# Patient Record
Sex: Female | Born: 1955 | Race: White | Hispanic: No | Marital: Single | Smoking: Former smoker
Health system: Southern US, Community
[De-identification: ages and names within clinical notes are randomized; demographics above are authoritative.]

## PROBLEM LIST (undated history)

## (undated) DIAGNOSIS — M549 Dorsalgia, unspecified: Secondary | ICD-10-CM

## (undated) DIAGNOSIS — G8929 Other chronic pain: Secondary | ICD-10-CM

## (undated) DIAGNOSIS — I10 Essential (primary) hypertension: Secondary | ICD-10-CM

## (undated) HISTORY — PX: BREAST SURGERY: SHX581

## (undated) HISTORY — PX: BREAST ENHANCEMENT SURGERY: SHX7

---

## 2004-10-18 ENCOUNTER — Emergency Department: Payer: Self-pay | Admitting: Emergency Medicine

## 2005-02-19 ENCOUNTER — Ambulatory Visit: Payer: Self-pay | Admitting: Family Medicine

## 2005-03-18 ENCOUNTER — Ambulatory Visit: Payer: Self-pay | Admitting: Family Medicine

## 2006-04-14 ENCOUNTER — Ambulatory Visit: Payer: Self-pay

## 2006-06-28 ENCOUNTER — Ambulatory Visit: Payer: Self-pay | Admitting: Family Medicine

## 2007-11-27 ENCOUNTER — Emergency Department: Payer: Self-pay | Admitting: Emergency Medicine

## 2007-12-02 ENCOUNTER — Emergency Department: Payer: Self-pay | Admitting: Emergency Medicine

## 2008-02-07 ENCOUNTER — Emergency Department: Payer: Self-pay | Admitting: Emergency Medicine

## 2008-06-06 ENCOUNTER — Ambulatory Visit: Payer: Self-pay | Admitting: Family Medicine

## 2015-04-10 ENCOUNTER — Other Ambulatory Visit: Payer: Self-pay | Admitting: Orthopedic Surgery

## 2015-04-10 DIAGNOSIS — M545 Low back pain, unspecified: Secondary | ICD-10-CM

## 2015-04-29 ENCOUNTER — Ambulatory Visit
Admission: RE | Admit: 2015-04-29 | Discharge: 2015-04-29 | Disposition: A | Discharge status: Home / Self Care | Payer: No Typology Code available for payment source | Source: Ambulatory Visit | Attending: Orthopedic Surgery | Admitting: Orthopedic Surgery

## 2015-04-29 DIAGNOSIS — M545 Low back pain, unspecified: Secondary | ICD-10-CM

## 2015-04-29 DIAGNOSIS — G8929 Other chronic pain: Secondary | ICD-10-CM | POA: Diagnosis present

## 2015-06-29 ENCOUNTER — Encounter: Payer: Self-pay | Admitting: Medical Oncology

## 2015-06-29 ENCOUNTER — Emergency Department
Admission: EM | Admit: 2015-06-29 | Discharge: 2015-06-29 | Disposition: A | Discharge status: Home / Self Care | Payer: BLUE CROSS/BLUE SHIELD | Attending: Emergency Medicine | Admitting: Emergency Medicine

## 2015-06-29 DIAGNOSIS — Z87891 Personal history of nicotine dependence: Secondary | ICD-10-CM | POA: Insufficient documentation

## 2015-06-29 DIAGNOSIS — I160 Hypertensive urgency: Secondary | ICD-10-CM | POA: Diagnosis not present

## 2015-06-29 DIAGNOSIS — I10 Essential (primary) hypertension: Secondary | ICD-10-CM | POA: Diagnosis present

## 2015-06-29 HISTORY — DX: Dorsalgia, unspecified: M54.9

## 2015-06-29 HISTORY — DX: Essential (primary) hypertension: I10

## 2015-06-29 HISTORY — DX: Other chronic pain: G89.29

## 2015-06-29 LAB — CBC
HEMATOCRIT: 38.5 % (ref 35.0–47.0)
HEMOGLOBIN: 13.4 g/dL (ref 12.0–16.0)
MCH: 31.1 pg (ref 26.0–34.0)
MCHC: 34.7 g/dL (ref 32.0–36.0)
MCV: 89.6 fL (ref 80.0–100.0)
Platelets: 228 10*3/uL (ref 150–440)
RBC: 4.3 MIL/uL (ref 3.80–5.20)
RDW: 13.2 % (ref 11.5–14.5)
WBC: 10.2 10*3/uL (ref 3.6–11.0)

## 2015-06-29 LAB — BASIC METABOLIC PANEL
Anion gap: 8 (ref 5–15)
BUN: 11 mg/dL (ref 6–20)
CHLORIDE: 101 mmol/L (ref 101–111)
CO2: 29 mmol/L (ref 22–32)
Calcium: 9.6 mg/dL (ref 8.9–10.3)
Creatinine, Ser: 0.64 mg/dL (ref 0.44–1.00)
GFR calc Af Amer: >60 mL/min (ref >60)
GLUCOSE: 111 mg/dL — AB (ref 65–99)
POTASSIUM: 3.3 mmol/L — AB (ref 3.5–5.1)
Sodium: 138 mmol/L (ref 135–145)

## 2015-06-29 LAB — TROPONIN I: Troponin I: <0.03 ng/mL (ref <0.031)

## 2015-06-29 MED ORDER — LORAZEPAM 2 MG/ML IJ SOLN
1.0000 mg | Freq: Once | INTRAMUSCULAR | Status: AC
  Administered 2015-06-29: 1 mg via INTRAVENOUS
  Filled 2015-06-29: qty 1

## 2015-06-29 MED ORDER — CLONIDINE HCL 0.2 MG PO TABS: 0.2000 mg | ORAL_TABLET | Freq: Two times a day (BID) | ORAL | Status: DC

## 2015-06-29 MED ORDER — LABETALOL HCL 5 MG/ML IV SOLN
20.0000 mg | Freq: Once | INTRAVENOUS | Status: AC
  Administered 2015-06-29: 20 mg via INTRAVENOUS
  Filled 2015-06-29: qty 4

## 2015-06-29 NOTE — ED Notes (Signed)
Family at bedside. 

## 2015-06-29 NOTE — ED Notes (Signed)
Pt presents to er with reports that she went to the drug store yesterday and checked her BP and it was elevated. Today she began having headache and dizziness. Pt hypertensive in triage.

## 2015-06-29 NOTE — ED Notes (Signed)
Patient denies pain and is resting comfortably.  

## 2015-06-29 NOTE — ED Provider Notes (Signed)
Time Seen: Approximately 1800  I have reviewed the triage notes  Chief Complaint: Hypertension   History of Present Illness: Deanna Jacobson is a 60 y.o. female states that she has been following her blood pressure as an outpatient is noticed increasing numbers. Patient states that she had an episode of an epidural injection for pain control with her back specialist and was noted to have some hypertension at that time. She hasn't upcoming appointment with [facility] but not until mid February. Patient states she was getting her blood pressure checked at a drugstore yesterday and noted that it was very elevated and states one episode the number was approximately 623J systolic. She denies any headaches or chest pain today she states she had some mild indigestion yesterday. She denies any nausea, vomiting, shortness of breath. Patient states she's tried to quit smoking and extensive tobacco history prior to that. He states at one time she was on blood pressure medications but she's not sure what the medication was at that time.   Past Medical History  Diagnosis Date  . Hypertension   . Chronic back pain     There are no active problems to display for this patient.   Past Surgical History  Procedure Laterality Date  . Breast enhancement surgery      Past Surgical History  Procedure Laterality Date  . Breast enhancement surgery      No current outpatient prescriptions on file.  Allergies:  Codeine  Family History: No family history on file.  Social History: Social History  Substance Use Topics  . Smoking status: Former Research scientist (life sciences)  . Smokeless tobacco: None  . Alcohol Use: Yes     Comment: daily     Review of Systems:   10 point review of systems was performed and was otherwise negative:  Constitutional: No fever Eyes: No visual disturbances ENT: No sore throat, ear pain Cardiac: No chest pain Respiratory: No shortness of breath, wheezing, or  stridor Abdomen: No abdominal pain, no vomiting, No diarrhea Endocrine: No weight loss, No night sweats Extremities: No peripheral edema, cyanosis Skin: No rashes, easy bruising Neurologic: No focal weakness, trouble with speech or swollowing Urologic: No dysuria, Hematuria, or urinary frequency   Physical Exam:  ED Triage Vitals  Enc Vitals Group     BP 06/29/15 1803 246/115 mmHg     Pulse Rate 06/29/15 1803 96     Resp 06/29/15 1803 18     Temp 06/29/15 1803 98.2 F (36.8 C)     Temp Source 06/29/15 1803 Oral     SpO2 06/29/15 1803 97 %     Weight 06/29/15 1803 104 lb (47.174 kg)     Height 06/29/15 1803 '5\' 1"'$  (1.549 m)     Head Cir --      Peak Flow --      Pain Score 06/29/15 1809 9     Pain Loc --      Pain Edu? --      Excl. in [place]? --     General: Awake , Alert , and Oriented times 3; GCS 15 Head: Normal cephalic , atraumatic Eyes: Pupils equal , round, reactive to light Nose/Throat: No nasal drainage, patent upper airway without erythema or exudate.  Neck: Supple, Full range of motion, No anterior adenopathy or palpable thyroid masses Lungs: Clear to ascultation without wheezes , rhonchi, or rales Heart: Regular rate, regular rhythm without murmurs , gallops , or rubs Abdomen: Soft, non tender without rebound, guarding ,  or rigidity; bowel sounds positive and symmetric in all 4 quadrants. No organomegaly .        Extremities: 2 plus symmetric pulses. No edema, clubbing or cyanosis Neurologic: normal ambulation, Motor symmetric without deficits, sensory intact Skin: warm, dry, no rashes   Labs:   All laboratory work was reviewed including any pertinent negatives or positives listed below:  [place] - Abnormal; Notable for the following:    Potassium 3.3 (*)    Glucose, Bld 111 (*)    All other components within normal limits  CBC  TROPONIN I    EKG ED ECG REPORT I, Daymon Larsen, the attending physician, personally viewed  and interpreted this ECG.  Date: 06/29/2015 EKG Time: 1813 Rate: 97 Rhythm: normal sinus rhythm QRS Axis: normal Intervals: normal ST/T Wave abnormalities: normal Conduction Disturbances: none Narrative Interpretation: unremarkable Left ventricular hypertrophy Nonspecific ST-T wave abnormality diffusely seen No acute ischemic change       ED Course:  Patient's stay here was uneventful and she was given labetalol 20 mg IV. She had good reduction of her blood pressure does not have any symptoms consistent with malignant hypertension at this time. , EKG shows no acute ischemic changes and troponins within normal limits. Renal function appears to be fine. I felt patient could be treated on an outpatient basis with initiation of blood pressure medication. I chose clonidine as a first line medication though advised the patient that when she sees her primary physician or most likely going to change it to a different medication. Then advised to return here if she develops a headache, chest pain, shortness of breath or any other new concerns.   Assessment:  Hypertensive urgency     Plan: * Outpatient management Patient was advised to return immediately if condition worsens. Patient was advised to follow up with their primary care physician or other specialized physicians involved in their outpatient care             Daymon Larsen, MD 06/29/15 2159

## 2015-09-26 ENCOUNTER — Telehealth: Payer: Self-pay | Admitting: *Deleted

## 2015-09-26 NOTE — Telephone Encounter (Signed)
Returned pt call lm asking her to please call me back so that I can assist her. However, she lm stating that she's wanting and appt her, but I do not have no trace of her referral....TD

## 2015-10-28 ENCOUNTER — Inpatient Hospital Stay
Admission: EM | Admit: 2015-10-28 | Discharge: 2015-10-29 | DRG: 683 | Disposition: A | Discharge status: Home / Self Care | Payer: BLUE CROSS/BLUE SHIELD | Attending: Internal Medicine | Admitting: Internal Medicine

## 2015-10-28 ENCOUNTER — Encounter: Payer: Self-pay | Admitting: Emergency Medicine

## 2015-10-28 DIAGNOSIS — N189 Chronic kidney disease, unspecified: Secondary | ICD-10-CM | POA: Diagnosis present

## 2015-10-28 DIAGNOSIS — R112 Nausea with vomiting, unspecified: Secondary | ICD-10-CM | POA: Diagnosis present

## 2015-10-28 DIAGNOSIS — I952 Hypotension due to drugs: Secondary | ICD-10-CM | POA: Diagnosis present

## 2015-10-28 DIAGNOSIS — H538 Other visual disturbances: Secondary | ICD-10-CM | POA: Diagnosis present

## 2015-10-28 DIAGNOSIS — Z79899 Other long term (current) drug therapy: Secondary | ICD-10-CM | POA: Diagnosis not present

## 2015-10-28 DIAGNOSIS — E860 Dehydration: Secondary | ICD-10-CM | POA: Diagnosis present

## 2015-10-28 DIAGNOSIS — E875 Hyperkalemia: Secondary | ICD-10-CM | POA: Diagnosis present

## 2015-10-28 DIAGNOSIS — G8929 Other chronic pain: Secondary | ICD-10-CM | POA: Diagnosis present

## 2015-10-28 DIAGNOSIS — N179 Acute kidney failure, unspecified: Principal | ICD-10-CM | POA: Diagnosis present

## 2015-10-28 DIAGNOSIS — E871 Hypo-osmolality and hyponatremia: Secondary | ICD-10-CM | POA: Diagnosis present

## 2015-10-28 DIAGNOSIS — Z87891 Personal history of nicotine dependence: Secondary | ICD-10-CM

## 2015-10-28 DIAGNOSIS — R197 Diarrhea, unspecified: Secondary | ICD-10-CM | POA: Diagnosis present

## 2015-10-28 DIAGNOSIS — Z885 Allergy status to narcotic agent status: Secondary | ICD-10-CM

## 2015-10-28 DIAGNOSIS — T50995A Adverse effect of other drugs, medicaments and biological substances, initial encounter: Secondary | ICD-10-CM | POA: Diagnosis present

## 2015-10-28 DIAGNOSIS — M545 Low back pain: Secondary | ICD-10-CM | POA: Diagnosis present

## 2015-10-28 DIAGNOSIS — E876 Hypokalemia: Secondary | ICD-10-CM | POA: Diagnosis present

## 2015-10-28 DIAGNOSIS — Z8249 Family history of ischemic heart disease and other diseases of the circulatory system: Secondary | ICD-10-CM | POA: Diagnosis not present

## 2015-10-28 DIAGNOSIS — I158 Other secondary hypertension: Secondary | ICD-10-CM | POA: Diagnosis present

## 2015-10-28 LAB — CBC WITH DIFFERENTIAL/PLATELET
Basophils Absolute: 0 10*3/uL (ref 0–0.1)
Basophils Relative: 0 %
EOS ABS: 0 10*3/uL (ref 0–0.7)
Eosinophils Relative: 0 %
HCT: 28.5 % — ABNORMAL LOW (ref 35.0–47.0)
HEMOGLOBIN: 10.1 g/dL — AB (ref 12.0–16.0)
LYMPHS ABS: 0.8 10*3/uL — AB (ref 1.0–3.6)
MCH: 30.7 pg (ref 26.0–34.0)
MCHC: 35.5 g/dL (ref 32.0–36.0)
MCV: 86.3 fL (ref 80.0–100.0)
Monocytes Absolute: 0.9 10*3/uL (ref 0.2–0.9)
Neutro Abs: 7.5 10*3/uL — ABNORMAL HIGH (ref 1.4–6.5)
Platelets: 264 10*3/uL (ref 150–440)
RBC: 3.3 MIL/uL — AB (ref 3.80–5.20)
RDW: 12.7 % (ref 11.5–14.5)
WBC: 9.3 10*3/uL (ref 3.6–11.0)

## 2015-10-28 LAB — URINALYSIS COMPLETE WITH MICROSCOPIC (ARMC ONLY)
Bilirubin Urine: NEGATIVE
GLUCOSE, UA: NEGATIVE mg/dL
Ketones, ur: NEGATIVE mg/dL
Nitrite: NEGATIVE
PH: 5 (ref 5.0–8.0)
Protein, ur: 30 mg/dL — AB
SPECIFIC GRAVITY, URINE: 1.006 (ref 1.005–1.030)
SQUAMOUS EPITHELIAL / LPF: NONE SEEN

## 2015-10-28 LAB — COMPREHENSIVE METABOLIC PANEL
ALK PHOS: 51 U/L (ref 38–126)
ALT: 11 U/L — AB (ref 14–54)
AST: 16 U/L (ref 15–41)
Albumin: 3.7 g/dL (ref 3.5–5.0)
Anion gap: 13 (ref 5–15)
BUN: 26 mg/dL — AB (ref 6–20)
CHLORIDE: 83 mmol/L — AB (ref 101–111)
CO2: 28 mmol/L (ref 22–32)
CREATININE: 3.22 mg/dL — AB (ref 0.44–1.00)
Calcium: 8.1 mg/dL — ABNORMAL LOW (ref 8.9–10.3)
GFR calc Af Amer: 17 mL/min — ABNORMAL LOW (ref >60)
GFR calc non Af Amer: 14 mL/min — ABNORMAL LOW (ref >60)
GLUCOSE: 96 mg/dL (ref 65–99)
Potassium: 3 mmol/L — ABNORMAL LOW (ref 3.5–5.1)
SODIUM: 124 mmol/L — AB (ref 135–145)
Total Bilirubin: 0.7 mg/dL (ref 0.3–1.2)
Total Protein: 6.7 g/dL (ref 6.5–8.1)

## 2015-10-28 LAB — TROPONIN I: Troponin I: <0.03 ng/mL (ref <0.031)

## 2015-10-28 LAB — MAGNESIUM: MAGNESIUM: 2.2 mg/dL (ref 1.7–2.4)

## 2015-10-28 MED ORDER — ACETAMINOPHEN 325 MG PO TABS
650.0000 mg | ORAL_TABLET | Freq: Four times a day (QID) | ORAL | Status: DC | PRN
Start: 2015-10-28 — End: 2015-10-29
  Administered 2015-10-29: 650 mg via ORAL
  Filled 2015-10-28: qty 2

## 2015-10-28 MED ORDER — SENNOSIDES-DOCUSATE SODIUM 8.6-50 MG PO TABS: 1.0000 | ORAL_TABLET | Freq: Every evening | ORAL | Status: DC | PRN

## 2015-10-28 MED ORDER — CYCLOBENZAPRINE HCL 10 MG PO TABS
10.0000 mg | ORAL_TABLET | Freq: Three times a day (TID) | ORAL | Status: DC | PRN
  Administered 2015-10-29: 08:00:00 10 mg via ORAL
  Filled 2015-10-28: qty 1

## 2015-10-28 MED ORDER — ACETAMINOPHEN 650 MG RE SUPP: 650.0000 mg | Freq: Four times a day (QID) | RECTAL | Status: DC | PRN

## 2015-10-28 MED ORDER — SODIUM CHLORIDE 0.9 % IV BOLUS (SEPSIS)
1000.0000 mL | Freq: Once | INTRAVENOUS | Status: AC
  Administered 2015-10-28: 1000 mL via INTRAVENOUS

## 2015-10-28 MED ORDER — POTASSIUM CHLORIDE IN NACL 40-0.9 MEQ/L-% IV SOLN
INTRAVENOUS | Status: DC
  Administered 2015-10-28 – 2015-10-29 (×3): 125 mL/h via INTRAVENOUS
  Filled 2015-10-28 (×5): qty 1000

## 2015-10-28 MED ORDER — TRAZODONE HCL 100 MG PO TABS
100.0000 mg | ORAL_TABLET | Freq: Every day | ORAL | Status: DC
  Administered 2015-10-28: 100 mg via ORAL
  Filled 2015-10-28: qty 1

## 2015-10-28 MED ORDER — POTASSIUM CHLORIDE CRYS ER 20 MEQ PO TBCR
40.0000 meq | EXTENDED_RELEASE_TABLET | Freq: Once | ORAL | Status: AC
  Administered 2015-10-28: 40 meq via ORAL
  Filled 2015-10-28: qty 2

## 2015-10-28 MED ORDER — ONDANSETRON HCL 4 MG/2ML IJ SOLN: 4.0000 mg | Freq: Four times a day (QID) | INTRAMUSCULAR | Status: DC | PRN

## 2015-10-28 MED ORDER — ENOXAPARIN SODIUM 30 MG/0.3ML ~~LOC~~ SOLN
30.0000 mg | SUBCUTANEOUS | Status: DC
  Administered 2015-10-28: 30 mg via SUBCUTANEOUS
  Filled 2015-10-28 (×2): qty 0.3

## 2015-10-28 MED ORDER — PREGABALIN 75 MG PO CAPS
75.0000 mg | ORAL_CAPSULE | Freq: Two times a day (BID) | ORAL | Status: DC
  Administered 2015-10-28 – 2015-10-29 (×2): 75 mg via ORAL
  Filled 2015-10-28 (×2): qty 1

## 2015-10-28 MED ORDER — ONDANSETRON HCL 4 MG PO TABS: 4.0000 mg | ORAL_TABLET | Freq: Four times a day (QID) | ORAL | Status: DC | PRN

## 2015-10-28 NOTE — H&P (Signed)
[place] [facility] [place] NAME: Deanna Jacobson    MR#:  [id number]  DATE OF BIRTH:  06/22/1955  DATE OF ADMISSION:  10/28/2015  PRIMARY CARE PHYSICIAN: [place]   REQUESTING/REFERRING PHYSICIAN: dr Darl Householder  CHIEF COMPLAINT:   Blurred vision, fatigue, weakness HISTORY OF PRESENT ILLNESS:  Deanna Jacobson  is a 60 y.o. female with a known history ofUncontrolled hypertension, chronic back pain  comes to the emergency room with her vision fatigue and weakness nausea vomiting and diarrhea. Her diuretics resolved. Patient was found to be severely dehydrated with creatinine of 3.2 her baseline creatinine is 0.4 in January 2017. She was also noted to follow-up have sodium of 124. Patient apparently was on clonidine and combination of hydrocodone as a lisinopril for her blood pressure was recently changed to hydrochlorothiazide lisinopril 20/25 mg. Patient states since then she started feeling poorly accompanied by nausea vomiting or diarrhea. She is being admitted for further evaluation of management of her acute renal failure hyperkalemia hyponatremia. PAST MEDICAL HISTORY:   Past Medical History  Diagnosis Date  . Hypertension   . Chronic back pain     PAST SURGICAL HISTOIRY:   Past Surgical History  Procedure Laterality Date  . Breast enhancement surgery      SOCIAL HISTORY:   Social History  Substance Use Topics  . Smoking status: Former Research scientist (life sciences)  . Smokeless tobacco: Not on file  . Alcohol Use: Yes     Comment: daily    FAMILY HISTORY:  HTN  DRUG ALLERGIES:   Allergies  Allergen Reactions  . Codeine     REVIEW OF SYSTEMS:  Review of Systems  Constitutional: Negative for fever, chills and weight loss.  HENT: Negative for ear discharge, ear pain and nosebleeds.   Eyes: Negative for blurred vision, pain and discharge.  Respiratory: Negative for sputum production, shortness of breath, wheezing and  stridor.   Cardiovascular: Negative for chest pain, palpitations, orthopnea and PND.  Gastrointestinal: Positive for vomiting and diarrhea. Negative for nausea and abdominal pain.  Genitourinary: Negative for urgency and frequency.  Musculoskeletal: Negative for back pain and joint pain.  Neurological: Positive for weakness. Negative for sensory change, speech change and focal weakness.  Psychiatric/Behavioral: Negative for depression and hallucinations. The patient is not nervous/anxious.   All other systems reviewed and are negative.    MEDICATIONS AT HOME:   Prior to Admission medications   Medication Sig Start Date End Date Taking? Authorizing Provider  cyclobenzaprine (FLEXERIL) 10 MG tablet Take 5-10 mg by mouth every 8 (eight) hours as needed. 10/02/15  Yes Historical Provider, MD  hydrochlorothiazide (HYDRODIURIL) 25 MG tablet Take 1 tablet by mouth daily. 10/02/15  Yes Historical Provider, MD  ibuprofen (ADVIL,MOTRIN) 800 MG tablet Take 1 tablet by mouth every 8 (eight) hours as needed. 10/20/15  Yes Historical Provider, MD  LYRICA 75 MG capsule Take 1 capsule by mouth 2 (two) times daily. 10/23/15  Yes Historical Provider, MD  terbinafine (LAMISIL) 250 MG tablet Take 1 tablet by mouth daily. 10/24/15  Yes Historical Provider, MD  traZODone (DESYREL) 100 MG tablet Take 1 tablet by mouth at bedtime. 10/27/15  Yes Historical Provider, MD  cloNIDine (CATAPRES) 0.2 MG tablet Take 1 tablet (0.2 mg total) by mouth 2 (two) times daily. Patient not taking: Reported on 10/28/2015 06/29/15 06/28/16  Daymon Larsen, MD      VITAL SIGNS:  Blood pressure 125/62, pulse 80, temperature 97.5 F (36.4 C), temperature  source Oral, resp. rate 16, height '5\' 1"'$  (1.549 m), weight 52.617 kg (116 lb), SpO2 100 %.  PHYSICAL EXAMINATION:  GENERAL:  60 y.o.-year-old patient lying in the bed with no acute distress.  EYES: Pupils equal, round, reactive to light and accommodation. No scleral icterus. Extraocular  muscles intact.  HEENT: Head atraumatic, normocephalic. Oropharynx and nasopharynx clear. Dry skin NECK:  Supple, no jugular venous distention. No thyroid enlargement, no tenderness.  LUNGS: Normal breath sounds bilaterally, no wheezing, rales,rhonchi or crepitation. No use of accessory muscles of respiration.  CARDIOVASCULAR: S1, S2 normal. No murmurs, rubs, or gallops.  ABDOMEN: Soft, nontender, nondistended. Bowel sounds present. No organomegaly or mass.  EXTREMITIES: No pedal edema, cyanosis, or clubbing.  NEUROLOGIC: Cranial nerves II through XII are intact. Muscle strength 5/5 in all extremities. Sensation intact. Gait not checked.  PSYCHIATRIC: The patient is alert and oriented x 3.  SKIN: No obvious rash, lesion, or ulcer.   LABORATORY PANEL:   CBC  Recent Labs Lab 10/28/15 0945  WBC 9.3  HGB 10.1*  HCT 28.5*  PLT 264   ------------------------------------------------------------------------------------------------------------------  Chemistries   Recent Labs Lab 10/28/15 0945  NA 124*  K 3.0*  CL 83*  CO2 28  GLUCOSE 96  BUN 26*  CREATININE 3.22*  CALCIUM 8.1*  AST 16  ALT 11*  ALKPHOS 51  BILITOT 0.7   ------------------------------------------------------------------------------------------------------------------  Cardiac Enzymes  Recent Labs Lab 10/28/15 0945  TROPONINI <0.03   IMPRESSION AND PLAN:   Deanna Jacobson  is a 60 y.o. female with a known history ofUncontrolled hypertension, chronic back pain  comes to the emergency room with her vision fatigue and weakness nausea vomiting and diarrhea. Her diuretics resolved. Patient was found to be severely dehydrated with creatinine of 3.2 her baseline creatinine is 0.4 in January 2017. She was also noted to follow-up have sodium of 124  1. acute renal failure with hyponatremia hypokalemia suspected due to blood pressure meds, ibuprofen, GI losses from nausea and vomiting. -Admit to medical  floor -IV fluids with normal saline and replace potassium orally and IV -Follow up metabolic panel closely -Nephrology consultation  2. History of hypertension uncontrolled as outpatient however now has related to hypotension due to GI losses -Hold off on blood pressure meds. -Consider calcium channel blockers and beta blockers  3. Chronic back pain -Patient takes ibuprofen, BC powders, cyclobenzaprine, Lyrica -Advised not to take NSAIDs Ultram when necessary and continue Lyrica  4. DVT prophylaxis subcutaneous Lovenox  5. Hyponatremia -IV fluids follow up metabolic panel closely   All the records are reviewed and case discussed with ED provider. Management plans discussed with the patient, family and they are in agreement.  CODE STATUS: full  TOTAL TIME TAKING CARE OF THIS PATIENT: 40 minutes.    Deanna Jacobson M.D on 10/28/2015 at 12:32 PM  Between 7am to 6pm - Pager - [phone]  After 6pm go to [email or web address] - password [email or web address]  [facility]  Office  [phone]  CC: Primary care physician; [place]

## 2015-10-28 NOTE — ED Notes (Signed)
Lying HR 77 BP 88/47 Sitting HR 80 BP 83/63 Standing HR 81 BP 102 53

## 2015-10-28 NOTE — ED Provider Notes (Addendum)
CSN: [id number]     Arrival date & time 10/28/15  3382 History   First MD Initiated Contact with Patient 10/28/15 [phone]     Chief Complaint  Patient presents with  . Hypertension     (Consider location/radiation/quality/duration/timing/severity/associated sxs/prior Treatment) The history is provided by the patient.  Mahika Vanvoorhis is a 60 y.o. female hx of HTN, here with hypotension, dizziness. Patient states that she has history of hypertension is difficult to control. She has tried multiple medicines in the past but didn't tolerate it well. She was started on lisinopril-HCTZ 20/25 mg a week ago. Since yesterday she feels lightheaded and dizzy and felt like she is gone passed out and she also had blurry vision. She had an episode of diarrhea but denies any abdominal pain or vomiting. Any fevers or chills or chest pain. On arrival, she was noted to be hypotensive 93/45 in triage.        Past Medical History  Diagnosis Date  . Hypertension   . Chronic back pain    Past Surgical History  Procedure Laterality Date  . Breast enhancement surgery     No family history on file. Social History  Substance Use Topics  . Smoking status: Former Research scientist (life sciences)  . Smokeless tobacco: None  . Alcohol Use: Yes     Comment: daily   OB History    No data available     Review of Systems  Neurological: Positive for dizziness.  All other systems reviewed and are negative.     Allergies  Codeine  Home Medications   Prior to Admission medications   Medication Sig Start Date End Date Taking? Authorizing Provider  cyclobenzaprine (FLEXERIL) 10 MG tablet Take 5-10 mg by mouth every 8 (eight) hours as needed. 10/02/15  Yes Historical Provider, MD  hydrochlorothiazide (HYDRODIURIL) 25 MG tablet Take 1 tablet by mouth daily. 10/02/15  Yes Historical Provider, MD  ibuprofen (ADVIL,MOTRIN) 800 MG tablet Take 1 tablet by mouth every 8 (eight) hours as needed. 10/20/15  Yes Historical Provider, MD  LYRICA 75  MG capsule Take 1 capsule by mouth 2 (two) times daily. 10/23/15  Yes Historical Provider, MD  terbinafine (LAMISIL) 250 MG tablet Take 1 tablet by mouth daily. 10/24/15  Yes Historical Provider, MD  traZODone (DESYREL) 100 MG tablet Take 1 tablet by mouth at bedtime. 10/27/15  Yes Historical Provider, MD  cloNIDine (CATAPRES) 0.2 MG tablet Take 1 tablet (0.2 mg total) by mouth 2 (two) times daily. Patient not taking: Reported on 10/28/2015 06/29/15 06/28/16  Daymon Larsen, MD   BP 116/73 mmHg  Pulse 76  Temp(Src) 97.5 F (36.4 C) (Oral)  Resp 15  Ht '5\' 1"'$  (1.549 m)  Wt 116 lb (52.617 kg)  BMI 21.93 kg/m2  SpO2 100% Physical Exam  Constitutional: She is oriented to person, place, and time. She appears well-developed and well-nourished.  HENT:  Head: Normocephalic.  Mouth/Throat: Oropharynx is clear and moist.  Eyes: Conjunctivae are normal. Pupils are equal, round, and reactive to light.  Neck: Normal range of motion.  Cardiovascular: Normal rate, regular rhythm and normal heart sounds.   Pulmonary/Chest: Effort normal and breath sounds normal. No respiratory distress. She has no wheezes. She has no rales.  Abdominal: Soft. Bowel sounds are normal. She exhibits no distension. There is no tenderness. There is no rebound.  No pulsatile mass, nontender   Musculoskeletal: Normal range of motion. She exhibits no edema or tenderness.  Neurological: She is alert and oriented to person, place,  and time. No cranial nerve deficit. Coordination normal.  Skin: Skin is warm and dry.  Psychiatric: She has a normal mood and affect. Her behavior is normal. Judgment and thought content normal.  Nursing note and vitals reviewed.   ED Course  Procedures (including critical care time)  CRITICAL CARE Performed by: Darl Householder, Brynne Doane   Total critical care time: 30 minutes  Critical care time was exclusive of separately billable procedures and treating other patients.  Critical care was necessary to treat  or prevent imminent or life-threatening deterioration.  Critical care was time spent personally by me on the following activities: development of treatment plan with patient and/or surrogate as well as nursing, discussions with consultants, evaluation of patient's response to treatment, examination of patient, obtaining history from patient or surrogate, ordering and performing treatments and interventions, ordering and review of laboratory studies, ordering and review of radiographic studies, pulse oximetry and re-evaluation of patient's condition.   Labs Review Labs Reviewed  CBC WITH DIFFERENTIAL/PLATELET - Abnormal; Notable for the following:    RBC 3.30 (*)    Hemoglobin 10.1 (*)    HCT 28.5 (*)    Neutro Abs 7.5 (*)    Lymphs Abs 0.8 (*)    All other components within normal limits  COMPREHENSIVE METABOLIC PANEL - Abnormal; Notable for the following:    Sodium 124 (*)    Potassium 3.0 (*)    Chloride 83 (*)    BUN 26 (*)    Creatinine, Ser 3.22 (*)    Calcium 8.1 (*)    ALT 11 (*)    GFR calc non Af Amer 14 (*)    GFR calc Af Amer 17 (*)    All other components within normal limits  TROPONIN I  URINALYSIS COMPLETEWITH MICROSCOPIC ([facility] ONLY)    Imaging Review No results found. I have personally reviewed and evaluated these images and lab results as part of my medical decision-making.   EKG Interpretation None      ED ECG REPORT I, Zethan Alfieri, the attending physician, personally viewed and interpreted this ECG.   Date: 10/28/2015  EKG Time: 9:36 am  Rate: 87  Rhythm: normal EKG, normal sinus rhythm  Axis: normal  Intervals:none  ST&T Change: nonspecific    MDM   Final diagnoses:  None    Shondra Capps is a 60 y.o. female here with dizziness, lightheadedness, hypotension after starting BP meds. Likely hypotensive from over adjustment of blood pressure meds. Will check labs, orthostatics. Will give IVF. Will likely decrease the BP meds. No abdominal  tenderness and no pulsatile mass and I doubt AAA rupture. No signs of sepsis.   11:43 AM Patient orthostatic. Has acute renal failure with Cr 3.3, Na 124 and K 3.0, supplemented. This is likely secondary to over diuresis from BP meds. Given acute renal failure and hyponatremia, will admit. Given 1 L NS bolus and BP now in the low 100s.    Wandra Arthurs, MD 10/28/15 Logan Fabion Gatson, MD 10/28/15 [phone]

## 2015-10-28 NOTE — ED Notes (Signed)
MD at bedside. 

## 2015-10-28 NOTE — ED Notes (Signed)
Pt presents with diarrhea and high blood pressure yesterday, pt did not take her bp meds today.

## 2015-10-28 NOTE — ED Notes (Signed)
Pt resting, friend at bedside.

## 2015-10-28 NOTE — ED Notes (Addendum)
Pt states that she will not go try to urinate at this time; unable to do so X 2 days per patient. Pt informed that we need urine sample. Pt requesting for something to drink. Given water.

## 2015-10-29 LAB — BASIC METABOLIC PANEL
Anion gap: 5 (ref 5–15)
BUN: 11 mg/dL (ref 6–20)
CALCIUM: 8.1 mg/dL — AB (ref 8.9–10.3)
CHLORIDE: 105 mmol/L (ref 101–111)
CO2: 25 mmol/L (ref 22–32)
CREATININE: 0.82 mg/dL (ref 0.44–1.00)
GFR calc Af Amer: >60 mL/min (ref >60)
GFR calc non Af Amer: >60 mL/min (ref >60)
GLUCOSE: 94 mg/dL (ref 65–99)
Potassium: 5 mmol/L (ref 3.5–5.1)
Sodium: 135 mmol/L (ref 135–145)

## 2015-10-29 MED ORDER — LISINOPRIL 20 MG PO TABS: 20.0000 mg | ORAL_TABLET | Freq: Every day | ORAL | Status: DC

## 2015-10-29 MED ORDER — LISINOPRIL 20 MG PO TABS
20.0000 mg | ORAL_TABLET | Freq: Every day | ORAL | Status: DC
  Administered 2015-10-29: 20 mg via ORAL
  Filled 2015-10-29: qty 1

## 2015-10-29 MED ORDER — HEPARIN SODIUM (PORCINE) 5000 UNIT/ML IJ SOLN: 5000.0000 [IU] | Freq: Three times a day (TID) | INTRAMUSCULAR | Status: DC

## 2015-10-29 MED ORDER — TRAMADOL HCL 50 MG PO TABS: 50.0000 mg | ORAL_TABLET | Freq: Four times a day (QID) | ORAL | Status: DC | PRN

## 2015-10-29 MED ORDER — TRAMADOL HCL 50 MG PO TABS
50.0000 mg | ORAL_TABLET | Freq: Four times a day (QID) | ORAL | Status: DC | PRN
  Administered 2015-10-29: 50 mg via ORAL
  Filled 2015-10-29: qty 1

## 2015-10-29 NOTE — Consult Note (Signed)
[facility]  CONSULT NOTE    Date: 10/29/2015                  Patient Name:  Deanna Jacobson  MRN: [id number]  DOB: 22-Feb-1956  Age / Sex: 60 y.o., female         PCP: [facility] [place]                 Service Requesting Consult: Dr. Posey Pronto                 Reason for Consult: Acute renal failure            History of Present Illness: Deanna Jacobson is a 60 y.o. white female with hypertension, who was admitted to [facility] on 10/28/2015 for Hyponatremia [E87.1] Hypotension due to drugs [I95.2] Renal failure (ARF), acute on chronic ([place]) [N17.9, N18.9]  Patient recently had her lisinopril and hydrochlorothiazide increased to improve blood pressure control.   She started to have weakness, diarrhea, nausea/vomiting and blurry vision. Creatinine on admission of 3.22 and sodium 124. Started on IV NS. This has improved and renal function back to baseline and sodium improved to 135.    Medications: Outpatient medications: Prescriptions prior to admission  Medication Sig Dispense Refill Last Dose  . cyclobenzaprine (FLEXERIL) 10 MG tablet Take 5-10 mg by mouth every 8 (eight) hours as needed.  2 PRN at PRN  . hydrochlorothiazide (HYDRODIURIL) 25 MG tablet Take 25 mg by mouth daily.   5 10/27/2015 at Unknown time  . ibuprofen (ADVIL,MOTRIN) 800 MG tablet Take 800 mg by mouth every 8 (eight) hours as needed.   2 PRN at PRN  . LYRICA 75 MG capsule Take 75 mg by mouth 2 (two) times daily.   2 10/28/2015 at Unknown time  . terbinafine (LAMISIL) 250 MG tablet Take 250 mg by mouth daily.   0 10/28/2015 at Unknown time  . traZODone (DESYREL) 100 MG tablet Take 100 mg by mouth at bedtime.   4 10/27/2015 at Unknown time  . cloNIDine (CATAPRES) 0.2 MG tablet Take 1 tablet (0.2 mg total) by mouth 2 (two) times daily. (Patient not taking: Reported on 10/28/2015) 60 tablet 0 Not Taking at Unknown time    Current medications: Current Facility-Administered Medications   Medication Dose Route Frequency Provider Last Rate Last Dose  . acetaminophen (TYLENOL) tablet 650 mg  650 mg Oral Q6H PRN Fritzi Mandes, MD   650 mg at 10/29/15 0743   Or  . acetaminophen (TYLENOL) suppository 650 mg  650 mg Rectal Q6H PRN Fritzi Mandes, MD      . cyclobenzaprine (FLEXERIL) tablet 10 mg  10 mg Oral TID PRN Fritzi Mandes, MD   10 mg at 10/29/15 0743  . heparin injection 5,000 Units  5,000 Units Subcutaneous [address] [place], [facility]      . lisinopril (PRINIVIL,ZESTRIL) tablet 20 mg  20 mg Oral Daily Fritzi Mandes, MD      . ondansetron [facility]) tablet 4 mg  4 mg Oral Q6H PRN Fritzi Mandes, MD       Or  . ondansetron (ZOFRAN) injection 4 mg  4 mg Intravenous Q6H PRN Fritzi Mandes, MD      . pregabalin (LYRICA) capsule 75 mg  75 mg Oral BID Fritzi Mandes, MD   75 mg at 10/29/15 0743  . senna-docusate (Senokot-S) tablet 1 tablet  1 tablet Oral QHS PRN Fritzi Mandes, MD      . traMADol Veatrice Bourbon) tablet  50 mg  50 mg Oral Q6H PRN Fritzi Mandes, MD      . traZODone (DESYREL) tablet 100 mg  100 mg Oral QHS Fritzi Mandes, MD   100 mg at 10/28/15 2242      Allergies: Allergies  Allergen Reactions  . Codeine       Past Medical History: Past Medical History  Diagnosis Date  . Hypertension   . Chronic back pain      Past Surgical History: Past Surgical History  Procedure Laterality Date  . Breast enhancement surgery       Family History: History reviewed. No pertinent family history.   Social History: Social History   Social History  . Marital Status: Single    Spouse Name: N/A  . Number of Children: N/A  . Years of Education: N/A   Occupational History  . Not on file.   Social History Main Topics  . Smoking status: Former Research scientist (life sciences)  . Smokeless tobacco: Not on file  . Alcohol Use: Yes     Comment: daily  . Drug Use: No  . Sexual Activity: Not on file   Other Topics Concern  . Not on file   Social History Narrative     Review of Systems: Review of Systems  Constitutional:  Positive for malaise/fatigue. Negative for fever, chills, weight loss and diaphoresis.  HENT: Negative for congestion, ear discharge, ear pain, hearing loss, nosebleeds, sore throat and tinnitus.   Eyes: Negative for blurred vision, double vision, photophobia, pain, discharge and redness.  Respiratory: Negative.  Negative for cough, hemoptysis, sputum production, shortness of breath, wheezing and stridor.   Cardiovascular: Negative.  Negative for chest pain, palpitations, orthopnea, claudication, leg swelling and PND.  Gastrointestinal: Positive for nausea, vomiting and diarrhea. Negative for heartburn, abdominal pain, constipation, blood in stool and melena.  Genitourinary: Negative for dysuria, urgency, frequency, hematuria and flank pain.  Musculoskeletal: Negative for myalgias, back pain, joint pain, falls and neck pain.  Skin: Negative for itching and rash.  Neurological: Positive for weakness. Negative for dizziness, tingling, tremors, sensory change, speech change, focal weakness, seizures, loss of consciousness and headaches.  Endo/Heme/Allergies: Negative for environmental allergies and polydipsia. Does not bruise/bleed easily.  Psychiatric/Behavioral: Negative.  Negative for depression, suicidal ideas, hallucinations, memory loss and substance abuse. The patient is not nervous/anxious and does not have insomnia.     Vital Signs: Blood pressure 151/62, pulse 82, temperature 97.5 F (36.4 C), temperature source Oral, resp. rate 16, height '5\' 1"'$  (1.549 m), weight 51.256 kg (113 lb), SpO2 98 %.  Weight trends: Filed Weights   10/28/15 0926 10/28/15 1700  Weight: 52.617 kg (116 lb) 51.256 kg (113 lb)    Physical Exam: General: NAD, laying in bed  Head: Normocephalic, atraumatic. Moist oral mucosal membranes  Eyes: Anicteric, PERRL  Neck: Supple, trachea midline  Lungs:  Clear to auscultation  Heart: Regular rate and rhythm  Abdomen:  Soft, nontender,   Extremities: no peripheral  edema.  Neurologic: Nonfocal, moving all four extremities  Skin: No lesions        Lab results: Basic Metabolic Panel:  Recent Labs Lab 10/28/15 0945 10/29/15 0635  NA 124* 135  K 3.0* 5.0  CL 83* 105  CO2 28 25  GLUCOSE 96 94  BUN 26* 11  CREATININE 3.22* 0.82  CALCIUM 8.1* 8.1*  MG 2.2  --     Liver Function Tests:  Recent Labs Lab 10/28/15 0945  AST 16  ALT 11*  ALKPHOS 51  BILITOT 0.7  PROT 6.7  ALBUMIN 3.7   No results for input(s): LIPASE, AMYLASE in the last 168 hours. No results for input(s): AMMONIA in the last 168 hours.  CBC:  Recent Labs Lab 10/28/15 0945  WBC 9.3  NEUTROABS 7.5*  HGB 10.1*  HCT 28.5*  MCV 86.3  PLT 264    Cardiac Enzymes:  Recent Labs Lab 10/28/15 0945  TROPONINI <0.03    BNP: Invalid input(s): POCBNP  CBG: No results for input(s): GLUCAP in the last 168 hours.  Microbiology: No results found for this or any previous visit.  Coagulation Studies: No results for input(s): LABPROT, INR in the last 72 hours.  Urinalysis:  Recent Labs  10/28/15 1540  COLORURINE YELLOW*  LABSPEC 1.006  PHURINE 5.0  GLUCOSEU NEGATIVE  HGBUR 1+*  BILIRUBINUR NEGATIVE  KETONESUR NEGATIVE  PROTEINUR 30*  NITRITE NEGATIVE  LEUKOCYTESUR 2+*      Imaging:  No results found.   Assessment & Plan: Ms. Jadynn Epping is a 60 y.o. white female with hypertension, who was admitted to [facility] on 10/28/2015 for Hyponatremia [E87.1] Hypotension due to drugs [I95.2] Renal failure (ARF), acute on chronic ([facility]) [N17.9, N18.9]  1. Acute renal failure 2. Hyponatremia 3. Hypokalemia 4. Hypertension  Restarted on lisinopril. Hold diuretics.  Needs outpatient follow up with Nephrology.      LOS: 1 Carline Dura 5/31/201711:05 AM

## 2015-10-29 NOTE — Discharge Summary (Signed)
[place] [facility] [place] NAME: Deanna Jacobson    MR#:  [id number]  DATE OF BIRTH:  Sep 06, 1955  DATE OF ADMISSION:  10/28/2015 ADMITTING PHYSICIAN: Fritzi Mandes, MD  DATE OF DISCHARGE: 10/29/15  PRIMARY CARE PHYSICIAN: [facility]    ADMISSION DIAGNOSIS:  Hyponatremia [E87.1] Hypotension due to drugs [I95.2] Renal failure (ARF), acute on chronic (HCC) [N17.9, N18.9]  DISCHARGE DIAGNOSIS:  Acute renal failure dye to dehydration from GI loss and meds HTN Chronic low back pain SECONDARY DIAGNOSIS:   Past Medical History  Diagnosis Date  . Hypertension   . Chronic back pain     HOSPITAL COURSE:  Deanna Jacobson is a 60 y.o. female with a known history ofUncontrolled hypertension, chronic back pain comes to the emergency room with her vision fatigue and weakness nausea vomiting and diarrhea. Her diuretics resolved. Patient was found to be severely dehydrated with creatinine of 3.2 her baseline creatinine is 0.4 in January 2017. She was also noted to follow-up have sodium of 124  1. acute renal failure with hyponatremia hypokalemia suspected due to blood pressure meds, ibuprofen, GI losses from nausea and vomiting. -received IV fluids with normal saline and replace potassium orally and IV -Follow up metabolic panel now normal -Nephrology consultation appreciated  2. History of hypertension uncontrolled as outpatient however now has related to hypotension due to GI losses -resume lisinopril  3. Chronic back pain -Patient takes ibuprofen, BC powders, cyclobenzaprine, Lyrica -Advised not to take NSAIDs Ultram when necessary and continue Lyrica  4. DVT prophylaxis subcutaneous Lovenox  5. Hyponatremia -resolved  Overall better. D/c home  CONSULTS OBTAINED:  Treatment Team:  Lavonia Dana, MD  DRUG ALLERGIES:   Allergies  Allergen Reactions  . Codeine     DISCHARGE MEDICATIONS:   Current Discharge  Medication List    START taking these medications   Details  lisinopril (PRINIVIL,ZESTRIL) 20 MG tablet Take 1 tablet (20 mg total) by mouth daily. Qty: 30 tablet, Refills: 2    traMADol (ULTRAM) 50 MG tablet Take 1 tablet (50 mg total) by mouth every 6 (six) hours as needed for moderate pain or severe pain. Qty: 25 tablet, Refills: 0      CONTINUE these medications which have NOT CHANGED   Details  cyclobenzaprine (FLEXERIL) 10 MG tablet Take 5-10 mg by mouth every 8 (eight) hours as needed. Refills: 2    LYRICA 75 MG capsule Take 75 mg by mouth 2 (two) times daily.  Refills: 2    terbinafine (LAMISIL) 250 MG tablet Take 250 mg by mouth daily.  Refills: 0    traZODone (DESYREL) 100 MG tablet Take 100 mg by mouth at bedtime.  Refills: 4      STOP taking these medications     hydrochlorothiazide (HYDRODIURIL) 25 MG tablet      ibuprofen (ADVIL,MOTRIN) 800 MG tablet      cloNIDine (CATAPRES) 0.2 MG tablet         If you experience worsening of your admission symptoms, develop shortness of breath, life threatening emergency, suicidal or homicidal thoughts you must seek medical attention immediately by calling 911 or calling your MD immediately  if symptoms less severe.  You Must read complete instructions/literature along with all the possible adverse reactions/side effects for all the Medicines you take and that have been prescribed to you. Take any new Medicines after you have completely understood and accept all the possible adverse reactions/side effects.   Please note  You were cared for by a hospitalist during your hospital stay. If you have any questions about your discharge medications or the care you received while you were in the hospital after you are discharged, you can call the unit and asked to speak with the hospitalist on call if the hospitalist that took care of you is not available. Once you are discharged, your primary care physician will handle any further  medical issues. Please note that NO REFILLS for any discharge medications will be authorized once you are discharged, as it is imperative that you return to your primary care physician (or establish a relationship with a primary care physician if you do not have one) for your aftercare needs so that they can reassess your need for medications and monitor your lab values. Today   SUBJECTIVE    Doing well. Wants to go home VITAL SIGNS:  Blood pressure 151/62, pulse 82, temperature 97.5 F (36.4 C), temperature source Oral, resp. rate 16, height '5\' 1"'$  (1.549 m), weight 51.256 kg (113 lb), SpO2 98 %.  I/O:   Intake/Output Summary (Last 24 hours) at 10/29/15 1118 Last data filed at 10/29/15 0725  Gross per 24 hour  Intake    240 ml  Output   4400 ml  Net  -4160 ml    PHYSICAL EXAMINATION:  GENERAL:  60 y.o.-year-old patient lying in the bed with no acute distress.  EYES: Pupils equal, round, reactive to light and accommodation. No scleral icterus. Extraocular muscles intact.  HEENT: Head atraumatic, normocephalic. Oropharynx and nasopharynx clear.  NECK:  Supple, no jugular venous distention. No thyroid enlargement, no tenderness.  LUNGS: Normal breath sounds bilaterally, no wheezing, rales,rhonchi or crepitation. No use of accessory muscles of respiration.  CARDIOVASCULAR: S1, S2 normal. No murmurs, rubs, or gallops.  ABDOMEN: Soft, non-tender, non-distended. Bowel sounds present. No organomegaly or mass.  EXTREMITIES: No pedal edema, cyanosis, or clubbing.  NEUROLOGIC: Cranial nerves II through XII are intact. Muscle strength 5/5 in all extremities. Sensation intact. Gait not checked.  PSYCHIATRIC: The patient is alert and oriented x 3.  SKIN: No obvious rash, lesion, or ulcer.   DATA REVIEW:   CBC   Recent Labs Lab 10/28/15 0945  WBC 9.3  HGB 10.1*  HCT 28.5*  PLT 264    Chemistries   Recent Labs Lab 10/28/15 0945 10/29/15 0635  NA 124* 135  K 3.0* 5.0  CL 83* 105   CO2 28 25  GLUCOSE 96 94  BUN 26* 11  CREATININE 3.22* 0.82  CALCIUM 8.1* 8.1*  MG 2.2  --   AST 16  --   ALT 11*  --   ALKPHOS 51  --   BILITOT 0.7  --     Microbiology Results   No results found for this or any previous visit (from the past 240 hour(s)).  RADIOLOGY:  No results found.   Management plans discussed with the patient, family and they are in agreement.  CODE STATUS:     Code Status Orders        Start     Ordered   10/28/15 1316  Full code   Continuous     10/28/15 1315    Code Status History    Date Active Date Inactive Code Status Order ID Comments User Context   This patient has a current code status but no historical code status.      TOTAL TIME TAKING CARE OF THIS PATIENT: 40 minutes.    Fritzi Scripter M.D  on 10/29/2015 at 11:18 AM  Between 7am to 6pm - Pager - [phone] After 6pm go to [email or web address] - password [email or web address] [facility]  [facility]  Office  [phone]  CC: Primary care physician; [place]

## 2015-10-29 NOTE — Progress Notes (Signed)
Discharge paperwork reviewed with patient who verbalized understanding. Prescription for Tramadol given to patient, Lisinopirl prescription sent electronically to [facility]. Patient family to transport patient home.

## 2015-11-20 ENCOUNTER — Encounter: Payer: Self-pay | Admitting: Pain Medicine

## 2015-11-20 ENCOUNTER — Ambulatory Visit: Payer: BLUE CROSS/BLUE SHIELD | Attending: Pain Medicine | Admitting: Pain Medicine

## 2015-11-20 ENCOUNTER — Encounter (INDEPENDENT_AMBULATORY_CARE_PROVIDER_SITE_OTHER): Payer: Self-pay

## 2015-11-20 VITALS — BP 186/94 | HR 88 | Temp 98.0°F | Resp 18 | Ht 61.0 in | Wt 116.0 lb

## 2015-11-20 DIAGNOSIS — M5489 Other dorsalgia: Secondary | ICD-10-CM

## 2015-11-20 DIAGNOSIS — Z87891 Personal history of nicotine dependence: Secondary | ICD-10-CM | POA: Insufficient documentation

## 2015-11-20 DIAGNOSIS — Z79891 Long term (current) use of opiate analgesic: Secondary | ICD-10-CM | POA: Insufficient documentation

## 2015-11-20 DIAGNOSIS — M1288 Other specific arthropathies, not elsewhere classified, other specified site: Secondary | ICD-10-CM | POA: Insufficient documentation

## 2015-11-20 DIAGNOSIS — I10 Essential (primary) hypertension: Secondary | ICD-10-CM | POA: Insufficient documentation

## 2015-11-20 DIAGNOSIS — M47816 Spondylosis without myelopathy or radiculopathy, lumbar region: Secondary | ICD-10-CM

## 2015-11-20 DIAGNOSIS — G8929 Other chronic pain: Secondary | ICD-10-CM | POA: Insufficient documentation

## 2015-11-20 DIAGNOSIS — Z0189 Encounter for other specified special examinations: Secondary | ICD-10-CM

## 2015-11-20 DIAGNOSIS — M47814 Spondylosis without myelopathy or radiculopathy, thoracic region: Secondary | ICD-10-CM

## 2015-11-20 DIAGNOSIS — M546 Pain in thoracic spine: Secondary | ICD-10-CM | POA: Insufficient documentation

## 2015-11-20 DIAGNOSIS — N179 Acute kidney failure, unspecified: Secondary | ICD-10-CM | POA: Diagnosis not present

## 2015-11-20 DIAGNOSIS — M5384 Other specified dorsopathies, thoracic region: Secondary | ICD-10-CM

## 2015-11-20 DIAGNOSIS — M069 Rheumatoid arthritis, unspecified: Secondary | ICD-10-CM | POA: Insufficient documentation

## 2015-11-20 DIAGNOSIS — Z5181 Encounter for therapeutic drug level monitoring: Secondary | ICD-10-CM

## 2015-11-20 DIAGNOSIS — R011 Cardiac murmur, unspecified: Secondary | ICD-10-CM | POA: Diagnosis not present

## 2015-11-20 DIAGNOSIS — M549 Dorsalgia, unspecified: Secondary | ICD-10-CM | POA: Diagnosis present

## 2015-11-20 DIAGNOSIS — F1190 Opioid use, unspecified, uncomplicated: Secondary | ICD-10-CM

## 2015-11-20 DIAGNOSIS — M47894 Other spondylosis, thoracic region: Secondary | ICD-10-CM

## 2015-11-20 DIAGNOSIS — Z79899 Other long term (current) drug therapy: Secondary | ICD-10-CM

## 2015-11-20 NOTE — Progress Notes (Signed)
Safety precautions to be maintained throughout the outpatient stay will include: orient to surroundings, keep bed in low position, maintain call bell within reach at all times, provide assistance with transfer out of bed and ambulation.  

## 2015-11-20 NOTE — Progress Notes (Signed)
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 to any listed on your instruction sheet.  A.P.C or A.P.C with Codeine Codeine Phosphate Capsules #3 Ibuprofen Ridaura  ABC compound Congesprin Imuran rimadil  Advil Cope Indocin Robaxisal  Alka-Seltzer Effervescent Pain Reliever and Antacid Coricidin or Coricidin-D  Indomethacin Rufen  Alka-Seltzer plus Cold Medicine Cosprin Ketoprofen S-A-C Tablets  Anacin Analgesic Tablets or Capsules Coumadin Korlgesic Salflex  Anacin Extra Strength Analgesic tablets or capsules CP-2 Tablets Lanoril Salicylate  Anaprox Cuprimine Capsules Levenox Salocol  Anexsia-D Dalteparin Magan Salsalate  Anodynos Darvon compound Magnesium Salicylate Sine-off  Ansaid Dasin Capsules Magsal Sodium Salicylate  Anturane Depen Capsules Marnal Soma   APF Arthritis pain formula Dewitt's Pills Measurin Stanback  Argesic Dia-Gesic Meclofenamic Sulfinpyrazone  Arthritis Bayer Timed Release Aspirin Diclofenac Meclomen Sulindac  Arthritis pain formula Anacin Dicumarol Medipren Supac  Analgesic (Safety coated) Arthralgen Diffunasal Mefanamic Suprofen  Arthritis Strength Bufferin Dihydrocodeine Mepro Compound Suprol  Arthropan liquid Dopirydamole Methcarbomol with Aspirin Synalgos  ASA tablets/Enseals Disalcid Micrainin Tagament  Ascriptin Doan's Midol Talwin  Ascriptin A/D Dolene Mobidin Tanderil  Ascriptin Extra Strength Dolobid Moblgesic Ticlid  Ascriptin with Codeine Doloprin or Doloprin with Codeine Momentum Tolectin  Asperbuf Duoprin Mono-gesic Trendar  Aspergum Duradyne Motrin or Motrin IB Triminicin  Aspirin plain, buffered or enteric coated Durasal Myochrisine Trigesic  Aspirin Suppositories Easprin Nalfon Trillsate  Aspirin with Codeine Ecotrin Regular or Extra Strength Naprosyn Uracel  Atromid-S Efficin Naproxen Ursinus  Auranofin Capsules Elmiron Neocylate Vanquish  Axotal Emagrin Norgesic Verin  Azathioprine Empirin or Empirin with Codeine Normiflo Vitamin E  Azolid Emprazil Nuprin Voltaren  Bayer Aspirin plain, buffered or children's or timed BC Tablets or powders Encaprin Orgaran Warfarin Sodium  Buff-a-Comp Enoxaparin Orudis Zorpin  Buff-a-Comp with Codeine Equegesic Os-Cal-Gesic   Buffaprin Excedrin plain, buffered or Extra Strength Oxalid   Bufferin Arthritis Strength Feldene Oxphenbutazone   Bufferin plain or Extra Strength Feldene Capsules Oxycodone with Aspirin   Bufferin with Codeine Fenoprofen Fenoprofen Pabalate or Pabalate-SF   Buffets II Flogesic Panagesic   Buffinol plain or Extra Strength Florinal or Florinal with Codeine Panwarfarin   Buf-Tabs Flurbiprofen Penicillamine   Butalbital Compound Four-way cold tablets Penicillin   Butazolidin Fragmin Pepto-Bismol   Carbenicillin Geminisyn Percodan   Carna  Arthritis Reliever Geopen Persantine   Carprofen Gold's salt Persistin   Chloramphenicol Goody's Phenylbutazone   Chloromycetin Haltrain Piroxlcam   Clmetidine heparin Plaquenil   Cllnoril Hyco-pap Ponstel   Clofibrate Hydroxy chloroquine Propoxyphen         Before stopping any of these medications, be sure to consult the physician who ordered them.  Some, such as Coumadin (Warfarin) are ordered to prevent or treat serious conditions such as "deep thrombosis", "pumonary embolisms", and other heart problems.  The amount of time that you may need off of the medication may also vary with the medication and the reason for which you were taking it.  If you are taking any of these medications, please make sure you notify your pain physician before you undergo any procedures.         Facet Blocks Patient Information  Description: The facets are joints in the spine between the vertebrae.  Like any joints in the body, facets can become irritated and painful.  Arthritis can also effect the facets.  By injecting steroids and local anesthetic in and around these joints, we can temporarily block the nerve supply to them.  Steroids act directly on irritated nerves and tissues to reduce selling and inflammation which often leads to decreased pain.  Facet blocks may be done anywhere along the spine from the neck to the low back depending upon the location of your pain.   After numbing the skin with local anesthetic (like Novocaine), a small needle is passed onto the facet joints under x-ray guidance.  You may experience a sensation of pressure while this is being done.  The entire block usually lasts about 15-25 minutes.   Conditions which may be treated by facet blocks:   Low back/buttock pain  Neck/shoulder pain  Certain types of headaches  Preparation for the injection:  1. Do not eat any solid food or dairy products within 8 hours of your appointment. 2. You may drink clear liquid up to 3  hours before appointment.  Clear liquids include water, black coffee, juice or soda.  No milk or cream please. 3. You may take your regular medication, including pain medications, with a sip of water before your appointment.  Diabetics should hold regular insulin (if taken separately) and take 1/2 normal NPH  dose the morning of the procedure.  Carry some sugar containing items with you to your appointment. 4. A driver must accompany you and be prepared to drive you home after your procedure. 5. Bring all your current medications with you. 6. An IV may be inserted and sedation may be given at the discretion of the physician. 7. A blood pressure cuff, EKG and other monitors will often be applied during the procedure.  Some patients may need to have extra oxygen administered for a short period. 8. You will be asked to provide medical information, including your allergies and medications, prior to the procedure.  We must know immediately if you are taking blood thinners (like Coumadin/Warfarin) or if you are allergic to IV iodine contrast (dye).  We must know if you could possible be pregnant.  Possible side-effects:   Bleeding from needle site  Infection (rare, may require surgery)  Nerve injury (rare)  Numbness & tingling (temporary)  Difficulty urinating (rare, temporary)  Spinal headache (a headache worse with upright posture)  Light-headedness (temporary)  Pain at injection site (serveral days)  Decreased blood pressure (rare, temporary)  Weakness in arm/leg (temporary)  Pressure sensation in back/neck (temporary)   Call if you experience:   Fever/chills associated with headache or increased back/neck pain  Headache worsened by an upright position  New onset, weakness or numbness of an extremity below the injection site  Hives or difficulty breathing (go to the emergency room)  Inflammation or drainage at the injection site(s)  Severe back/neck pain greater than  usual  New symptoms which are concerning to you  Please note:  Although the local anesthetic injected can often make your back or neck feel good for several hours after the injection, the pain will likely return. It takes 3-7 days for steroids to work.  You may not notice any pain relief for at least one week.  If effective, we will often do a series of 2-3 injections spaced 3-6 weeks apart to maximally decrease your pain.  After the initial series, you may be a candidate for a more permanent nerve block of the facets.  If you have any questions, please call #[phone] [facility]

## 2015-11-20 NOTE — Patient Instructions (Signed)
GENERAL RISKS AND COMPLICATIONS  What are the risk, side effects and possible complications? Generally speaking, most procedures are safe.  However, with any procedure there are risks, side effects, and the possibility of complications.  The risks and complications are dependent upon the sites that are lesioned, or the type of nerve block to be performed.  The closer the procedure is to the spine, the more serious the risks are.  Great care is taken when placing the radio frequency needles, block needles or lesioning probes, but sometimes complications can occur. 1. Infection: Any time there is an injection through the skin, there is a risk of infection.  This is why sterile conditions are used for these blocks.  There are four possible types of infection. 1. Localized skin infection. 2. Central Nervous System Infection-This can be in the form of Meningitis, which can be deadly. 3. Epidural Infections-This can be in the form of an epidural abscess, which can cause pressure inside of the spine, causing compression of the spinal cord with subsequent paralysis. This would require an emergency surgery to decompress, and there are no guarantees that the patient would recover from the paralysis. 4. Discitis-This is an infection of the intervertebral discs.  It occurs in about 1% of discography procedures.  It is difficult to treat and it may lead to surgery.        2. Pain: the needles have to go through skin and soft tissues, will cause soreness.       3. Damage to internal structures:  The nerves to be lesioned may be near blood vessels or    other nerves which can be potentially damaged.       4. Bleeding: Bleeding is more common if the patient is taking blood thinners such as  aspirin, Coumadin, Ticiid, Plavix, etc., or if he/she have some genetic predisposition  such as hemophilia. Bleeding into the spinal canal can cause compression of the spinal  cord with subsequent paralysis.  This would require an  emergency surgery to  decompress and there are no guarantees that the patient would recover from the  paralysis.       5. Pneumothorax:  Puncturing of a lung is a possibility, every time a needle is introduced in  the area of the chest or upper back.  Pneumothorax refers to free air around the  collapsed lung(s), inside of the thoracic cavity (chest cavity).  Another two possible  complications related to a similar event would include: Hemothorax and Chylothorax.   These are variations of the Pneumothorax, where instead of air around the collapsed  lung(s), you may have blood or chyle, respectively.       6. Spinal headaches: They may occur with any procedures in the area of the spine.       7. Persistent CSF (Cerebro-Spinal Fluid) leakage: This is a rare problem, but may occur  with prolonged intrathecal or epidural catheters either due to the formation of a fistulous  track or a dural tear.       8. Nerve damage: By working so close to the spinal cord, there is always a possibility of  nerve damage, which could be as serious as a permanent spinal cord injury with  paralysis.       9. Death:  Although rare, severe deadly allergic reactions known as "Anaphylactic  reaction" can occur to any of the medications used.      10. Worsening of the symptoms:  We can always make thing worse.    What are the chances of something like this happening? Chances of any of this occuring are extremely low.  By statistics, you have more of a chance of getting killed in a motor vehicle accident: while driving to the hospital than any of the above occurring .  Nevertheless, you should be aware that they are possibilities.  In general, it is similar to taking a shower.  Everybody knows that you can slip, hit your head and get killed.  Does that mean that you should not shower again?  Nevertheless always keep in mind that statistics do not mean anything if you happen to be on the wrong side of them.  Even if a procedure has a 1  (one) in a 1,000,000 (million) chance of going wrong, it you happen to be that one..Also, keep in mind that by statistics, you have more of a chance of having something go wrong when taking medications.  Who should not have this procedure? If you are on a blood thinning medication (e.g. Coumadin, Plavix, see list of "Blood Thinners"), or if you have an active infection going on, you should not have the procedure.  If you are taking any blood thinners, please inform your physician.  How should I prepare for this procedure?  Do not eat or drink anything at least six hours prior to the procedure.  Bring a driver with you .  It cannot be a taxi.  Come accompanied by an adult that can drive you back, and that is strong enough to help you if your legs get weak or numb from the local anesthetic.  Take all of your medicines the morning of the procedure with just enough water to swallow them.  If you have diabetes, make sure that you are scheduled to have your procedure done first thing in the morning, whenever possible.  If you have diabetes, take only half of your insulin dose and notify our nurse that you have done so as soon as you arrive at the clinic.  If you are diabetic, but only take blood sugar pills (oral hypoglycemic), then do not take them on the morning of your procedure.  You may take them after you have had the procedure.  Do not take aspirin or any aspirin-containing medications, at least eleven (11) days prior to the procedure.  They may prolong bleeding.  Wear loose fitting clothing that may be easy to take off and that you would not mind if it got stained with Betadine or blood.  Do not wear any jewelry or perfume  Remove any nail coloring.  It will interfere with some of our monitoring equipment.  NOTE: Remember that this is not meant to be interpreted as a complete list of all possible complications.  Unforeseen problems may occur.  BLOOD THINNERS The following drugs  contain aspirin or other products, which can cause increased bleeding during surgery and should not be taken for 2 weeks prior to and 1 week after surgery.  If you should need take something for relief of minor pain, you may take acetaminophen which is found in Tylenol,m Datril, Anacin-3 and Panadol. It is not blood thinner. The products listed below are.  Do not take any of the products listed below in addition to any listed on your instruction sheet.  A.P.C or A.P.C with Codeine Codeine Phosphate Capsules #3 Ibuprofen Ridaura  ABC compound Congesprin Imuran rimadil  Advil Cope Indocin Robaxisal  Alka-Seltzer Effervescent Pain Reliever and Antacid Coricidin or Coricidin-D  Indomethacin Rufen    Alka-Seltzer plus Cold Medicine Cosprin Ketoprofen S-A-C Tablets  Anacin Analgesic Tablets or Capsules Coumadin Korlgesic Salflex  Anacin Extra Strength Analgesic tablets or capsules CP-2 Tablets Lanoril Salicylate  Anaprox Cuprimine Capsules Levenox Salocol  Anexsia-D Dalteparin Magan Salsalate  Anodynos Darvon compound Magnesium Salicylate Sine-off  Ansaid Dasin Capsules Magsal Sodium Salicylate  Anturane Depen Capsules Marnal Soma  APF Arthritis pain formula Dewitt's Pills Measurin Stanback  Argesic Dia-Gesic Meclofenamic Sulfinpyrazone  Arthritis Bayer Timed Release Aspirin Diclofenac Meclomen Sulindac  Arthritis pain formula Anacin Dicumarol Medipren Supac  Analgesic (Safety coated) Arthralgen Diffunasal Mefanamic Suprofen  Arthritis Strength Bufferin Dihydrocodeine Mepro Compound Suprol  Arthropan liquid Dopirydamole Methcarbomol with Aspirin Synalgos  ASA tablets/Enseals Disalcid Micrainin Tagament  Ascriptin Doan's Midol Talwin  Ascriptin A/D Dolene Mobidin Tanderil  Ascriptin Extra Strength Dolobid Moblgesic Ticlid  Ascriptin with Codeine Doloprin or Doloprin with Codeine Momentum Tolectin  Asperbuf Duoprin Mono-gesic Trendar  Aspergum Duradyne Motrin or Motrin IB Triminicin  Aspirin  plain, buffered or enteric coated Durasal Myochrisine Trigesic  Aspirin Suppositories Easprin Nalfon Trillsate  Aspirin with Codeine Ecotrin Regular or Extra Strength Naprosyn Uracel  Atromid-S Efficin Naproxen Ursinus  Auranofin Capsules Elmiron Neocylate Vanquish  Axotal Emagrin Norgesic Verin  Azathioprine Empirin or Empirin with Codeine Normiflo Vitamin E  Azolid Emprazil Nuprin Voltaren  Bayer Aspirin plain, buffered or children's or timed BC Tablets or powders Encaprin Orgaran Warfarin Sodium  Buff-a-Comp Enoxaparin Orudis Zorpin  Buff-a-Comp with Codeine Equegesic Os-Cal-Gesic   Buffaprin Excedrin plain, buffered or Extra Strength Oxalid   Bufferin Arthritis Strength Feldene Oxphenbutazone   Bufferin plain or Extra Strength Feldene Capsules Oxycodone with Aspirin   Bufferin with Codeine Fenoprofen Fenoprofen Pabalate or Pabalate-SF   Buffets II Flogesic Panagesic   Buffinol plain or Extra Strength Florinal or Florinal with Codeine Panwarfarin   Buf-Tabs Flurbiprofen Penicillamine   Butalbital Compound Four-way cold tablets Penicillin   Butazolidin Fragmin Pepto-Bismol   Carbenicillin Geminisyn Percodan   Carna Arthritis Reliever Geopen Persantine   Carprofen Gold's salt Persistin   Chloramphenicol Goody's Phenylbutazone   Chloromycetin Haltrain Piroxlcam   Clmetidine heparin Plaquenil   Cllnoril Hyco-pap Ponstel   Clofibrate Hydroxy chloroquine Propoxyphen         Before stopping any of these medications, be sure to consult the physician who ordered them.  Some, such as Coumadin (Warfarin) are ordered to prevent or treat serious conditions such as "deep thrombosis", "pumonary embolisms", and other heart problems.  The amount of time that you may need off of the medication may also vary with the medication and the reason for which you were taking it.  If you are taking any of these medications, please make sure you notify your pain physician before you undergo any  procedures.         Facet Blocks Patient Information  Description: The facets are joints in the spine between the vertebrae.  Like any joints in the body, facets can become irritated and painful.  Arthritis can also effect the facets.  By injecting steroids and local anesthetic in and around these joints, we can temporarily block the nerve supply to them.  Steroids act directly on irritated nerves and tissues to reduce selling and inflammation which often leads to decreased pain.  Facet blocks may be done anywhere along the spine from the neck to the low back depending upon the location of your pain.   After numbing the skin with local anesthetic (like Novocaine), a small needle is passed onto the facet joints under x-ray guidance.    You may experience a sensation of pressure while this is being done.  The entire block usually lasts about 15-25 minutes.   Conditions which may be treated by facet blocks:   Low back/buttock pain  Neck/shoulder pain  Certain types of headaches  Preparation for the injection:  1. Do not eat any solid food or dairy products within 8 hours of your appointment. 2. You may drink clear liquid up to 3 hours before appointment.  Clear liquids include water, black coffee, juice or soda.  No milk or cream please. 3. You may take your regular medication, including pain medications, with a sip of water before your appointment.  Diabetics should hold regular insulin (if taken separately) and take 1/2 normal NPH dose the morning of the procedure.  Carry some sugar containing items with you to your appointment. 4. A driver must accompany you and be prepared to drive you home after your procedure. 5. Bring all your current medications with you. 6. An IV may be inserted and sedation may be given at the discretion of the physician. 7. A blood pressure cuff, EKG and other monitors will often be applied during the procedure.  Some patients may need to have extra oxygen  administered for a short period. 8. You will be asked to provide medical information, including your allergies and medications, prior to the procedure.  We must know immediately if you are taking blood thinners (like Coumadin/Warfarin) or if you are allergic to IV iodine contrast (dye).  We must know if you could possible be pregnant.  Possible side-effects:   Bleeding from needle site  Infection (rare, may require surgery)  Nerve injury (rare)  Numbness & tingling (temporary)  Difficulty urinating (rare, temporary)  Spinal headache (a headache worse with upright posture)  Light-headedness (temporary)  Pain at injection site (serveral days)  Decreased blood pressure (rare, temporary)  Weakness in arm/leg (temporary)  Pressure sensation in back/neck (temporary)   Call if you experience:   Fever/chills associated with headache or increased back/neck pain  Headache worsened by an upright position  New onset, weakness or numbness of an extremity below the injection site  Hives or difficulty breathing (go to the emergency room)  Inflammation or drainage at the injection site(s)  Severe back/neck pain greater than usual  New symptoms which are concerning to you  Please note:  Although the local anesthetic injected can often make your back or neck feel good for several hours after the injection, the pain will likely return. It takes 3-7 days for steroids to work.  You may not notice any pain relief for at least one week.  If effective, we will often do a series of 2-3 injections spaced 3-6 weeks apart to maximally decrease your pain.  After the initial series, you may be a candidate for a more permanent nerve block of the facets.  If you have any questions, please call #[phone] [facility] 

## 2015-11-25 ENCOUNTER — Other Ambulatory Visit: Payer: Self-pay

## 2015-11-30 LAB — COMPLIANCE DRUG ANALYSIS, UR: PDF: 0

## 2015-12-05 ENCOUNTER — Other Ambulatory Visit
Admission: RE | Admit: 2015-12-05 | Discharge: 2015-12-05 | Disposition: A | Discharge status: Home / Self Care | Payer: BLUE CROSS/BLUE SHIELD | Source: Ambulatory Visit | Attending: Pain Medicine | Admitting: Pain Medicine

## 2015-12-05 DIAGNOSIS — G8929 Other chronic pain: Secondary | ICD-10-CM | POA: Diagnosis not present

## 2015-12-05 LAB — SEDIMENTATION RATE: SED RATE: 25 mm/h (ref 0–30)

## 2015-12-05 LAB — MAGNESIUM: Magnesium: 1.8 mg/dL (ref 1.7–2.4)

## 2015-12-05 LAB — VITAMIN B12: VITAMIN B 12: 354 pg/mL (ref 180–914)

## 2015-12-05 LAB — C-REACTIVE PROTEIN: CRP: 0.5 mg/dL (ref <1.0)

## 2015-12-10 LAB — 25-HYDROXYVITAMIN D LCMS D2+D3: 25-HYDROXY, VITAMIN D: 59 ng/mL

## 2015-12-10 LAB — 25-HYDROXY VITAMIN D LCMS D2+D3: 25-Hydroxy, Vitamin D-3: 59 ng/mL

## 2015-12-30 ENCOUNTER — Telehealth: Payer: Self-pay

## 2015-12-30 ENCOUNTER — Encounter (INDEPENDENT_AMBULATORY_CARE_PROVIDER_SITE_OTHER): Payer: Self-pay

## 2015-12-30 ENCOUNTER — Encounter: Payer: Self-pay | Admitting: Pain Medicine

## 2015-12-30 ENCOUNTER — Ambulatory Visit: Payer: BLUE CROSS/BLUE SHIELD | Attending: Pain Medicine | Admitting: Pain Medicine

## 2015-12-30 VITALS — BP 184/80 | HR 94 | Temp 97.8°F | Resp 16 | Ht 61.0 in | Wt 116.0 lb

## 2015-12-30 DIAGNOSIS — M791 Myalgia: Secondary | ICD-10-CM | POA: Insufficient documentation

## 2015-12-30 DIAGNOSIS — M47894 Other spondylosis, thoracic region: Secondary | ICD-10-CM

## 2015-12-30 DIAGNOSIS — Z0189 Encounter for other specified special examinations: Secondary | ICD-10-CM

## 2015-12-30 DIAGNOSIS — F1190 Opioid use, unspecified, uncomplicated: Secondary | ICD-10-CM | POA: Diagnosis not present

## 2015-12-30 DIAGNOSIS — Z87891 Personal history of nicotine dependence: Secondary | ICD-10-CM | POA: Diagnosis not present

## 2015-12-30 DIAGNOSIS — G8929 Other chronic pain: Secondary | ICD-10-CM | POA: Diagnosis not present

## 2015-12-30 DIAGNOSIS — M546 Pain in thoracic spine: Secondary | ICD-10-CM | POA: Diagnosis not present

## 2015-12-30 DIAGNOSIS — M1991 Primary osteoarthritis, unspecified site: Secondary | ICD-10-CM | POA: Diagnosis not present

## 2015-12-30 DIAGNOSIS — M1990 Unspecified osteoarthritis, unspecified site: Secondary | ICD-10-CM | POA: Insufficient documentation

## 2015-12-30 DIAGNOSIS — M47816 Spondylosis without myelopathy or radiculopathy, lumbar region: Secondary | ICD-10-CM

## 2015-12-30 DIAGNOSIS — M150 Primary generalized (osteo)arthritis: Secondary | ICD-10-CM

## 2015-12-30 DIAGNOSIS — M549 Dorsalgia, unspecified: Secondary | ICD-10-CM

## 2015-12-30 DIAGNOSIS — Z79891 Long term (current) use of opiate analgesic: Secondary | ICD-10-CM | POA: Diagnosis not present

## 2015-12-30 DIAGNOSIS — M5489 Other dorsalgia: Secondary | ICD-10-CM

## 2015-12-30 DIAGNOSIS — M792 Neuralgia and neuritis, unspecified: Secondary | ICD-10-CM

## 2015-12-30 DIAGNOSIS — M7918 Myalgia, other site: Secondary | ICD-10-CM

## 2015-12-30 DIAGNOSIS — M5384 Other specified dorsopathies, thoracic region: Secondary | ICD-10-CM

## 2015-12-30 DIAGNOSIS — N179 Acute kidney failure, unspecified: Secondary | ICD-10-CM | POA: Insufficient documentation

## 2015-12-30 DIAGNOSIS — I10 Essential (primary) hypertension: Secondary | ICD-10-CM | POA: Insufficient documentation

## 2015-12-30 DIAGNOSIS — M47814 Spondylosis without myelopathy or radiculopathy, thoracic region: Secondary | ICD-10-CM

## 2015-12-30 DIAGNOSIS — M159 Polyosteoarthritis, unspecified: Secondary | ICD-10-CM

## 2015-12-30 MED ORDER — MELOXICAM 15 MG PO TABS: 15.0000 mg | ORAL_TABLET | Freq: Every day | ORAL | 0 refills | Status: DC

## 2015-12-30 MED ORDER — LYRICA 75 MG PO CAPS: 75.0000 mg | ORAL_CAPSULE | Freq: Every day | ORAL | 0 refills | Status: DC

## 2015-12-30 MED ORDER — CYCLOBENZAPRINE HCL 10 MG PO TABS: 10.0000 mg | ORAL_TABLET | Freq: Three times a day (TID) | ORAL | 0 refills | Status: DC | PRN

## 2015-12-30 MED ORDER — TRAMADOL HCL 50 MG PO TABS: 50.0000 mg | ORAL_TABLET | Freq: Three times a day (TID) | ORAL | 0 refills | Status: DC | PRN

## 2015-12-30 NOTE — Progress Notes (Signed)
Patient's Name: Deanna Jacobson  Patient type: Established  MRN: [id number]  Service setting: Ambulatory outpatient  DOB: 09/23/1955  Location: [facility]  DOS: 12/30/2015  Primary Care Physician: [place]  Note by: Kathlen Brunswick. Dossie Arbour, M.D, [place], Sarita Haver, DABPM, Milagros Evener, [place]  Referring Physician: [facility] [place]: Board-Certified Interventional Pain Management  Last Visit to Pain Management: 11/20/2015   Primary Reason(s) for Visit: Encounter for evaluation before starting new chronic pain management plan of care (Level of risk: moderate) CC: Back Pain (middle, center)   HPI  Deanna Jacobson is a 60 y.o. year old, female patient, who returns today as an established patient. She has Acute renal failure (ARF) ([place]); Chronic pain; Long term current use of opiate analgesic; Long term prescription opiate use; Opiate use; Encounter for therapeutic drug level monitoring; Encounter for pain management planning; Chronic mid back pain; Chronic thoracic spine pain (midline); Thoracic facet syndrome; Lumbar facet arthropathy; Musculoskeletal pain; Neurogenic pain; and Osteoarthritis on her problem list.. Her primarily concern today is the Back Pain (middle, center)   Pain Assessment: Self-Reported Pain Score: 8  Clinically the patient looks like a 2/10 Reported level is inconsistent with clinical obrservations Information on the proper use of the pain score provided to the patient today. Pain Type: Chronic pain Pain Location: Back Pain Orientation: Medial, Mid Pain Descriptors / Indicators: Burning (stinging) Pain Frequency: Constant  The patient comes into the clinics today for post-procedure evaluation on the interventional treatment done on 11/20/2015. In addition, she comes in today for pharmacological management of her chronic pain.  The patient  reports that she uses drugs.  Date of Last Visit: 11/20/15 Service Provided on Last Visit:  Evaluation  Controlled Substance Pharmacotherapy Assessment & REMS (Risk Evaluation and Mitigation Strategy)  Analgesic: Tramadol 50 mg 1 every 6 hours (200 mg/day of tramadol) MME/day: 20 mg/day Pill Count: N/A (We have not written for anything yet.) Pharmacokinetics: Onset of action (Liberation/Absorption): Within expected pharmacological parameters Time to Peak effect (Distribution): Timing and results are as within normal expected parameters Duration of action (Metabolism/Excretion): Within normal limits for medication Pharmacodynamics: Analgesic Effect: More than 50% Activity Facilitation: Medication(s) allow patient to sit, stand, walk, and do the basic ADLs Perceived Effectiveness: Described as relatively effective, allowing for increase in activities of daily living (ADL) Side-effects or Adverse reactions: None reported Monitoring: [place] PMP: Online review of the past 37-monthperiod conducted. Compliant with practice rules and regulations Last UDS on record: Summary  Date Value Ref Range Status  11/20/2015 FINAL  Final    Comment:    ==================================================================== TOXASSURE COMP DRUG ANALYSIS,UR ==================================================================== Test                             Result       Flag       Units Drug Present and Declared for Prescription Verification   Tramadol                       PRESENT      EXPECTED   O-Desmethyltramadol            PRESENT      EXPECTED   N-Desmethyltramadol            PRESENT      EXPECTED    Source of tramadol is a prescription medication.    O-desmethyltramadol and N-desmethyltramadol are expected    metabolites of  tramadol.   Pregabalin                     PRESENT      EXPECTED   Cyclobenzaprine                PRESENT      EXPECTED   Trazodone                      PRESENT      EXPECTED   1,3 chlorophenyl piperazine    PRESENT      EXPECTED    1,3-chlorophenyl piperazine is an  expected metabolite of    trazodone. Drug Absent but Declared for Prescription Verification   Diazepam                       Not Detected UNEXPECTED ng/mg creat   Ibuprofen                      Not Detected UNEXPECTED    Ibuprofen, as indicated in the declared medication list, is not    always detected even when used as directed. ==================================================================== Test                      Result    Flag   Units      Ref Range   Creatinine              42               mg/dL      >=20 ==================================================================== Declared Medications:  The flagging and interpretation on this report are based on the  following declared medications.  Unexpected results may arise from  inaccuracies in the declared medications.  **Note: The testing scope of this panel includes these medications:  Cyclobenzaprine (Flexeril)  Diazepam (Valium)  Pregabalin (Lyrica)  Tramadol (Ultram)  Trazodone (Desyrel)  **Note: The testing scope of this panel does not include small to  moderate amounts of these reported medications:  Ibuprofen  **Note: The testing scope of this panel does not include following  reported medications:  Hydrochlorothiazide (Hydrodiuril)  Hydrochlorothiazide (Lisinopril-HCTZ)  Lisinopril  Lisinopril (Lisinopril-HCTZ)  Meloxicam (Mobic)  Prednisone (Deltasone)  Terbinafine (Lamisil) ==================================================================== For clinical consultation, please call [phone]. ====================================================================    UDS interpretation: Compliant Patient informed of the CDC guidelines and recommendations to stay away from the concomitant use of benzodiazepines and opioids due to the increased risk of respiratory depression and death. Medication Assessment Form: Reviewed. Patient indicates being compliant with therapy Treatment compliance: Not applicable  yet Risk Assessment: Aberrant Behavior: None observed today Substance Use Disorder (SUD) Risk Level: Low Risk of opioid abuse or dependence: 0.7-3.0% with doses ? 36 MME/day and 6.1-26% with doses ? 120 MME/day. Opioid Risk Tool (ORT) Score:  0 Low Risk for SUD (Score <3) Depression Scale Score: PHQ-2: PHQ-2 Total Score: 0 No depression (0) PHQ-9: PHQ-9 Total Score: 0 No depression (0-4)  Pharmacologic Plan: Today we may be taking over the patient's pharmacological regimen. See below  Previous Illicit Drug Screen Labs(s): No results found for: MDMA, COCAINSCRNUR, PCPSCRNUR, [facility]  Laboratory Chemistry  Inflammation Markers Lab Results  Component Value Date   ESRSEDRATE 25 12/05/2015   CRP 0.5 12/05/2015    Renal Function Lab Results  Component Value Date   BUN 11 10/29/2015   CREATININE 0.82 10/29/2015   GFRAA >60 10/29/2015   GFRNONAA >60  10/29/2015    Hepatic Function Lab Results  Component Value Date   AST 16 10/28/2015   ALT 11 (L) 10/28/2015   ALBUMIN 3.7 10/28/2015    Electrolytes Lab Results  Component Value Date   NA 135 10/29/2015   K 5.0 10/29/2015   CL 105 10/29/2015   CALCIUM 8.1 (L) 10/29/2015   MG 1.8 12/05/2015    Pain Modulating Vitamins Lab Results  Component Value Date   25OHVITD1 59 12/05/2015   25OHVITD2 <1.0 12/05/2015   25OHVITD3 59 12/05/2015   VITAMINB12 354 12/05/2015    Coagulation Parameters Lab Results  Component Value Date   PLT 264 10/28/2015    Cardiovascular Lab Results  Component Value Date   HGB 10.1 (L) 10/28/2015   HCT 28.5 (L) 10/28/2015    Note: Lab results reviewed.  Recent Diagnostic Imaging  Thoracic Imaging: Thoracic DG 2-3 views:  Results for orders placed in visit on 06/06/08  DG Thoracic Spine 2 View   Narrative * PRIOR REPORT IMPORTED FROM AN EXTERNAL SYSTEM *   PRIOR REPORT IMPORTED FROM THE [employer] WORKFLOW SYSTEM   REASON FOR EXAM:    BACK PAIN  COMMENTS:   PROCEDURE:     DXR - DXR  THORACIC  AP AND LATERAL  - Jun 06 2008 11:20AM   RESULT:     Images of the thoracic spine demonstrate the vertebral body  heights and intervertebral disc spaces appear to be normal. Spinal  alignment  is normal. Some mild degenerative end-plate spurring is present. The  included ribs show no gross bony abnormality.   IMPRESSION:      Mild degenerative changes of the thoracic spine. MRI  follow-up is available if desired. No definite fracture is seen. Alignment  is maintained.   Thank you for this opportunity to contribute to the care of your patient.       Thoracic DG:  Results for orders placed in visit on 11/25/15  DG THORACOLUMABAR SPINE   Note: Imaging results reviewed.  Meds  The patient has a current medication list which includes the following prescription(s): cyclobenzaprine, lisinopril, meloxicam, tramadol, trazodone, and lyrica.  Current Outpatient Prescriptions on File Prior to Visit  Medication Sig  . lisinopril (PRINIVIL,ZESTRIL) 20 MG tablet Take 1 tablet (20 mg total) by mouth daily.  . traZODone (DESYREL) 100 MG tablet Take 100 mg by mouth at bedtime.    No current facility-administered medications on file prior to visit.     ROS  Constitutional: Denies any fever or chills Gastrointestinal: No reported hemesis, hematochezia, vomiting, or acute GI distress Musculoskeletal: Denies any acute onset joint swelling, redness, loss of ROM, or weakness Neurological: No reported episodes of acute onset apraxia, aphasia, dysarthria, agnosia, amnesia, paralysis, loss of coordination, or loss of consciousness  Allergies  Ms. Kyllonen is allergic to codeine.  [place]  Medical:  Ms. Bertucci  has a past medical history of Chronic back pain and Hypertension. Family: family history includes Cancer in her mother; Hypertension in her father. Surgical:  has a past surgical history that includes Breast enhancement surgery. Tobacco:  reports that she has quit smoking. She  does not have any smokeless tobacco history on file. Alcohol:  reports that she drinks alcohol. Drug:  reports that she uses drugs.  Constitutional Exam  Vitals: Blood pressure (!) 184/80, pulse 94, temperature 97.8 F (36.6 C), temperature source Oral, resp. rate 16, height '5\' 1"'$  (1.549 m), weight 116 lb (52.6 kg), SpO2 100 %. General appearance: Well nourished,  well developed, and well hydrated. In no acute distress Calculated BMI/Body habitus: Body mass index is 21.92 kg/m. (18.5-24.9 kg/m2) Ideal body weight Psych/Mental status: Alert and oriented x 3 (person, place, & time) Eyes: PERLA Respiratory: No evidence of acute respiratory distress  Cervical Spine Exam  Inspection: No masses, redness, or swelling Alignment: Symmetrical ROM: Functional: ROM appears unrestricted Stability: No instability detected Muscle strength & Tone: Functionally intact Sensory: Unimpaired Palpation: No complaints of tenderness  Upper Extremity (UE) Exam    Side: Right upper extremity  Side: Left upper extremity  Inspection: No masses, redness, swelling, or asymmetry  Inspection: No masses, redness, swelling, or asymmetry  ROM:  ROM:  Functional: ROM appears unrestricted  Functional: ROM appears unrestricted  Muscle strength & Tone: Functionally intact  Muscle strength & Tone: Functionally intact  Sensory: Unimpaired  Sensory: Unimpaired  Palpation: Non-contributory  Palpation: Non-contributory   Thoracic Spine Exam  Inspection: No masses, redness, or swelling Alignment: Symmetrical ROM: Functional: ROM appears unrestricted Stability: No instability detected Sensory: Unimpaired Muscle strength & Tone: Functionally intact Palpation: No complaints of tenderness  Lumbar Spine Exam  Inspection: No masses, redness, or swelling Alignment: Symmetrical ROM: Functional: ROM appears unrestricted Stability: No instability detected Muscle strength & Tone: Functionally intact Sensory:  Unimpaired Palpation: No complaints of tenderness Provocative Tests: Lumbar Hyperextension and rotation test: evaluation deferred today       Patrick's Maneuver: evaluation deferred today              Gait & Posture Assessment  Ambulation: Unassisted Gait: Relatively normal for age and body habitus Posture: WNL   Lower Extremity Exam    Side: Right lower extremity  Side: Left lower extremity  Inspection: No masses, redness, swelling, or asymmetry ROM:  Inspection: No masses, redness, swelling, or asymmetry ROM:  Functional: ROM appears unrestricted  Functional: ROM appears unrestricted  Muscle strength & Tone: Functionally intact  Muscle strength & Tone: Functionally intact  Sensory: Unimpaired  Sensory: Unimpaired  Palpation: Non-contributory  Palpation: Non-contributory    Assessment & Plan  Primary Diagnosis & Pertinent Problem List: The primary encounter diagnosis was Chronic pain. Diagnoses of Encounter for pain management planning, Long term current use of opiate analgesic, Opiate use, Chronic thoracic spine pain (midline), Chronic mid back pain, Lumbar facet arthropathy, Thoracic facet syndrome, Musculoskeletal pain, Neurogenic pain, and Primary osteoarthritis involving multiple joints were also pertinent to this visit.  Visit Diagnosis: 1. Chronic pain   2. Encounter for pain management planning   3. Long term current use of opiate analgesic   4. Opiate use   5. Chronic thoracic spine pain (midline)   6. Chronic mid back pain   7. Lumbar facet arthropathy   8. Thoracic facet syndrome   9. Musculoskeletal pain   10. Neurogenic pain   11. Primary osteoarthritis involving multiple joints     Problems updated and reviewed during this visit: No problems updated.  Problem-specific Plan(s): No problem-specific Assessment & Plan notes found for this encounter.  No new Assessment & Plan notes have been filed under this hospital service since the last note was  generated. Service: Pain Management   Plan of Care   Problem List Items Addressed This Visit      High   Chronic mid back pain (Chronic)   Relevant Medications   meloxicam (MOBIC) 15 MG tablet   cyclobenzaprine (FLEXERIL) 10 MG tablet   traMADol (ULTRAM) 50 MG tablet   Other Relevant Orders   THORACIC FACET BLOCK  Chronic pain - Primary (Chronic)   Relevant Medications   meloxicam (MOBIC) 15 MG tablet   cyclobenzaprine (FLEXERIL) 10 MG tablet   LYRICA 75 MG capsule   traMADol (ULTRAM) 50 MG tablet   Chronic thoracic spine pain (midline) (Chronic)   Relevant Medications   meloxicam (MOBIC) 15 MG tablet   cyclobenzaprine (FLEXERIL) 10 MG tablet   traMADol (ULTRAM) 50 MG tablet   Other Relevant Orders   THORACIC FACET BLOCK   Lumbar facet arthropathy (Chronic)   Musculoskeletal pain (Chronic)   Relevant Medications   cyclobenzaprine (FLEXERIL) 10 MG tablet   Neurogenic pain (Chronic)   Relevant Medications   LYRICA 75 MG capsule   Osteoarthritis (Chronic)   Relevant Medications   meloxicam (MOBIC) 15 MG tablet   cyclobenzaprine (FLEXERIL) 10 MG tablet   traMADol (ULTRAM) 50 MG tablet   Thoracic facet syndrome (Chronic)   Relevant Medications   meloxicam (MOBIC) 15 MG tablet   cyclobenzaprine (FLEXERIL) 10 MG tablet   traMADol (ULTRAM) 50 MG tablet   Other Relevant Orders   THORACIC FACET BLOCK     Medium   Encounter for pain management planning   Long term current use of opiate analgesic (Chronic)   Opiate use (Chronic)    Other Visit Diagnoses   None.      Pharmacotherapy (Medications Ordered): Meds ordered this encounter  Medications  . meloxicam (MOBIC) 15 MG tablet    Sig: Take 1 tablet (15 mg total) by mouth daily.    Dispense:  30 tablet    Refill:  0    Do not add this medication to the electronic "Automatic Refill" notification system. Patient may have prescription filled one day early if pharmacy is closed on scheduled refill date.  .  cyclobenzaprine (FLEXERIL) 10 MG tablet    Sig: Take 1 tablet (10 mg total) by mouth every 8 (eight) hours as needed for muscle spasms.    Dispense:  90 tablet    Refill:  0    Do not add this medication to the electronic "Automatic Refill" notification system. Patient may have prescription filled one day early if pharmacy is closed on scheduled refill date.  Marland Kitchen LYRICA 75 MG capsule    Sig: Take 1 capsule (75 mg total) by mouth daily.    Dispense:  60 capsule    Refill:  0    Do not add this medication to the electronic "Automatic Refill" notification system. Patient may have prescription filled one day early if pharmacy is closed on scheduled refill date.  . traMADol (ULTRAM) 50 MG tablet    Sig: Take 1 tablet (50 mg total) by mouth every 8 (eight) hours as needed for severe pain.    Dispense:  90 tablet    Refill:  0    Do not add this medication to the electronic "Automatic Refill" notification system. Patient may have prescription filled one day early if pharmacy is closed on scheduled refill date. Do not fill until: 12/30/15 To last until: 01/29/16    [facility] & Procedure Ordered: Orders Placed This Encounter  Procedures  . THORACIC FACET BLOCK    Imaging Ordered: None  Interventional Therapies: Scheduled: Diagnostic bilateral thoracic facet block under fluoroscopic guidance and IV sedation.    Considering: 1. Diagnostic bilateral thoracic facet block under fluoroscopic guidance and IV sedation. 2. Possible bilateral thoracic facet radiofrequency ablation depending on the results of the diagnostic injection.    PRN Procedures:  None at this time.  Referral(s) or Consult(s): None at this time.  New Prescriptions   No medications on file    Medications administered during this visit: Ms. Kuhnert had no medications administered during this visit.  Requested PM Follow-up: Return in about 4 weeks (around 01/26/2016) for Med-Mgmt, (1-Mo), In addition, Schedule  Procedure.  Future Appointments Date Time Provider [place]  01/27/2016 9:20 AM Milinda Pointer, MD [facility] None    Primary Care Physician: [facility] Location: [facility] Note by: Kathlen Brunswick. Dossie Arbour, M.D, DABA, DABAPM, DABPM, DABIPP, FIPP  Pain Score Disclaimer: We use the NRS-11 scale. This is a self-reported, subjective measurement of pain severity with only modest accuracy. It is used primarily to identify changes within a particular patient. It must be understood that outpatient pain scales are significantly less accurate that those used for research, where they can be applied under ideal controlled circumstances with minimal exposure to variables. In reality, the score is likely to be a combination of pain intensity and pain affect, where pain affect describes the degree of emotional arousal or changes in action readiness caused by the sensory experience of pain. Factors such as social and work situation, setting, emotional state, anxiety levels, expectation, and prior pain experience may influence pain perception and show large inter-individual differences that may also be affected by time variables.  Patient instructions provided during this appointment: Patient Instructions   GENERAL RISKS AND COMPLICATIONS  What are the risk, side effects and possible complications? Generally speaking, most procedures are safe.  However, with any procedure there are risks, side effects, and the possibility of complications.  The risks and complications are dependent upon the sites that are lesioned, or the type of nerve block to be performed.  The closer the procedure is to the spine, the more serious the risks are.  Great care is taken when placing the radio frequency needles, block needles or lesioning probes, but sometimes complications can occur. 1. Infection: Any time there is an injection through the skin, there is a risk of infection.  This  is why sterile conditions are used for these blocks.  There are four possible types of infection. 1. Localized skin infection. 2. Central Nervous System Infection-This can be in the form of Meningitis, which can be deadly. 3. Epidural Infections-This can be in the form of an epidural abscess, which can cause pressure inside of the spine, causing compression of the spinal cord with subsequent paralysis. This would require an emergency surgery to decompress, and there are no guarantees that the patient would recover from the paralysis. 4. Discitis-This is an infection of the intervertebral discs.  It occurs in about 1% of discography procedures.  It is difficult to treat and it may lead to surgery.        2. Pain: the needles have to go through skin and soft tissues, will cause soreness.       3. Damage to internal structures:  The nerves to be lesioned may be near blood vessels or    other nerves which can be potentially damaged.       4. Bleeding: Bleeding is more common if the patient is taking blood thinners such as  aspirin, Coumadin, Ticiid, Plavix, etc., or if he/she have some genetic predisposition  such as hemophilia. Bleeding into the spinal canal can cause compression of the spinal  cord with subsequent paralysis.  This would require an emergency surgery to  decompress and there are no guarantees that the patient would recover  from the  paralysis.       5. Pneumothorax:  Puncturing of a lung is a possibility, every time a needle is introduced in  the area of the chest or upper back.  Pneumothorax refers to free air around the  collapsed lung(s), inside of the thoracic cavity (chest cavity).  Another two possible  complications related to a similar event would include: Hemothorax and Chylothorax.   These are variations of the Pneumothorax, where instead of air around the collapsed  lung(s), you may have blood or chyle, respectively.       6. Spinal headaches: They may occur with any procedures in  the area of the spine.       7. Persistent CSF (Cerebro-Spinal Fluid) leakage: This is a rare problem, but may occur  with prolonged intrathecal or epidural catheters either due to the formation of a fistulous  track or a dural tear.       8. Nerve damage: By working so close to the spinal cord, there is always a possibility of  nerve damage, which could be as serious as a permanent spinal cord injury with  paralysis.       9. Death:  Although rare, severe deadly allergic reactions known as "Anaphylactic  reaction" can occur to any of the medications used.      10. Worsening of the symptoms:  We can always make thing worse.  What are the chances of something like this happening? Chances of any of this occuring are extremely low.  By statistics, you have more of a chance of getting killed in a motor vehicle accident: while driving to the hospital than any of the above occurring .  Nevertheless, you should be aware that they are possibilities.  In general, it is similar to taking a shower.  Everybody knows that you can slip, hit your head and get killed.  Does that mean that you should not shower again?  Nevertheless always keep in mind that statistics do not mean anything if you happen to be on the wrong side of them.  Even if a procedure has a 1 (one) in a 1,000,000 (million) chance of going wrong, it you happen to be that one..Also, keep in mind that by statistics, you have more of a chance of having something go wrong when taking medications.  Who should not have this procedure? If you are on a blood thinning medication (e.g. Coumadin, Plavix, see list of "Blood Thinners"), or if you have an active infection going on, you should not have the procedure.  If you are taking any blood thinners, please inform your physician.  How should I prepare for this procedure?  Do not eat or drink anything at least six hours prior to the procedure.  Bring a driver with you .  It cannot be a taxi.  Come  accompanied by an adult that can drive you back, and that is strong enough to help you if your legs get weak or numb from the local anesthetic.  Take all of your medicines the morning of the procedure with just enough water to swallow them.  If you have diabetes, make sure that you are scheduled to have your procedure done first thing in the morning, whenever possible.  If you have diabetes, take only half of your insulin dose and notify our nurse that you have done so as soon as you arrive at the clinic.  If you are diabetic, but only take blood sugar pills (oral hypoglycemic),  then do not take them on the morning of your procedure.  You may take them after you have had the procedure.  Do not take aspirin or any aspirin-containing medications, at least eleven (11) days prior to the procedure.  They may prolong bleeding.  Wear loose fitting clothing that may be easy to take off and that you would not mind if it got stained with Betadine or blood.  Do not wear any jewelry or perfume  Remove any nail coloring.  It will interfere with some of our monitoring equipment.  NOTE: Remember that this is not meant to be interpreted as a complete list of all possible complications.  Unforeseen problems may occur.  BLOOD THINNERS The following drugs contain aspirin or other products, which can cause increased bleeding during surgery and should not be taken for 2 weeks prior to and 1 week after surgery.  If you should need take something for relief of minor pain, you may take acetaminophen which is found in Tylenol,m Datril, Anacin-3 and Panadol. It is not blood thinner. The products listed below are.  Do not take any of the products listed below in addition to any listed on your instruction sheet.  A.P.C or A.P.C with Codeine Codeine Phosphate Capsules #3 Ibuprofen Ridaura  ABC compound Congesprin Imuran rimadil  Advil Cope Indocin Robaxisal  Alka-Seltzer Effervescent Pain Reliever and Antacid Coricidin  or Coricidin-D  Indomethacin Rufen  Alka-Seltzer plus Cold Medicine Cosprin Ketoprofen S-A-C Tablets  Anacin Analgesic Tablets or Capsules Coumadin Korlgesic Salflex  Anacin Extra Strength Analgesic tablets or capsules CP-2 Tablets Lanoril Salicylate  Anaprox Cuprimine Capsules Levenox Salocol  Anexsia-D Dalteparin Magan Salsalate  Anodynos Darvon compound Magnesium Salicylate Sine-off  Ansaid Dasin Capsules Magsal Sodium Salicylate  Anturane Depen Capsules Marnal Soma  APF Arthritis pain formula Dewitt's Pills Measurin Stanback  Argesic Dia-Gesic Meclofenamic Sulfinpyrazone  Arthritis Bayer Timed Release Aspirin Diclofenac Meclomen Sulindac  Arthritis pain formula Anacin Dicumarol Medipren Supac  Analgesic (Safety coated) Arthralgen Diffunasal Mefanamic Suprofen  Arthritis Strength Bufferin Dihydrocodeine Mepro Compound Suprol  Arthropan liquid Dopirydamole Methcarbomol with Aspirin Synalgos  ASA tablets/Enseals Disalcid Micrainin Tagament  Ascriptin Doan's Midol Talwin  Ascriptin A/D Dolene Mobidin Tanderil  Ascriptin Extra Strength Dolobid Moblgesic Ticlid  Ascriptin with Codeine Doloprin or Doloprin with Codeine Momentum Tolectin  Asperbuf Duoprin Mono-gesic Trendar  Aspergum Duradyne Motrin or Motrin IB Triminicin  Aspirin plain, buffered or enteric coated Durasal Myochrisine Trigesic  Aspirin Suppositories Easprin Nalfon Trillsate  Aspirin with Codeine Ecotrin Regular or Extra Strength Naprosyn Uracel  Atromid-S Efficin Naproxen Ursinus  Auranofin Capsules Elmiron Neocylate Vanquish  Axotal Emagrin Norgesic Verin  Azathioprine Empirin or Empirin with Codeine Normiflo Vitamin E  Azolid Emprazil Nuprin Voltaren  Bayer Aspirin plain, buffered or children's or timed BC Tablets or powders Encaprin Orgaran Warfarin Sodium  Buff-a-Comp Enoxaparin Orudis Zorpin  Buff-a-Comp with Codeine Equegesic Os-Cal-Gesic   Buffaprin Excedrin plain, buffered or Extra Strength Oxalid   Bufferin  Arthritis Strength Feldene Oxphenbutazone   Bufferin plain or Extra Strength Feldene Capsules Oxycodone with Aspirin   Bufferin with Codeine Fenoprofen Fenoprofen Pabalate or Pabalate-SF   Buffets II Flogesic Panagesic   Buffinol plain or Extra Strength Florinal or Florinal with Codeine Panwarfarin   Buf-Tabs Flurbiprofen Penicillamine   Butalbital Compound Four-way cold tablets Penicillin   Butazolidin Fragmin Pepto-Bismol   Carbenicillin Geminisyn Percodan   Carna Arthritis Reliever Geopen Persantine   Carprofen Gold's salt Persistin   Chloramphenicol Goody's Phenylbutazone   Chloromycetin Haltrain Piroxlcam   Clmetidine  heparin Plaquenil   Cllnoril Hyco-pap Ponstel   Clofibrate Hydroxy chloroquine Propoxyphen         Before stopping any of these medications, be sure to consult the physician who ordered them.  Some, such as Coumadin (Warfarin) are ordered to prevent or treat serious conditions such as "deep thrombosis", "pumonary embolisms", and other heart problems.  The amount of time that you may need off of the medication may also vary with the medication and the reason for which you were taking it.  If you are taking any of these medications, please make sure you notify your pain physician before you undergo any procedures.         Facet Blocks Patient Information  Description: The facets are joints in the spine between the vertebrae.  Like any joints in the body, facets can become irritated and painful.  Arthritis can also effect the facets.  By injecting steroids and local anesthetic in and around these joints, we can temporarily block the nerve supply to them.  Steroids act directly on irritated nerves and tissues to reduce selling and inflammation which often leads to decreased pain.  Facet blocks may be done anywhere along the spine from the neck to the low back depending upon the location of your pain.   After numbing the skin with local anesthetic (like Novocaine), a  small needle is passed onto the facet joints under x-ray guidance.  You may experience a sensation of pressure while this is being done.  The entire block usually lasts about 15-25 minutes.   Conditions which may be treated by facet blocks:   Low back/buttock pain  Neck/shoulder pain  Certain types of headaches  Preparation for the injection:  1. Do not eat any solid food or dairy products within 8 hours of your appointment. 2. You may drink clear liquid up to 3 hours before appointment.  Clear liquids include water, black coffee, juice or soda.  No milk or cream please. 3. You may take your regular medication, including pain medications, with a sip of water before your appointment.  Diabetics should hold regular insulin (if taken separately) and take 1/2 normal NPH dose the morning of the procedure.  Carry some sugar containing items with you to your appointment. 4. A driver must accompany you and be prepared to drive you home after your procedure. 5. Bring all your current medications with you. 6. An IV may be inserted and sedation may be given at the discretion of the physician. 7. A blood pressure cuff, EKG and other monitors will often be applied during the procedure.  Some patients may need to have extra oxygen administered for a short period. 8. You will be asked to provide medical information, including your allergies and medications, prior to the procedure.  We must know immediately if you are taking blood thinners (like Coumadin/Warfarin) or if you are allergic to IV iodine contrast (dye).  We must know if you could possible be pregnant.  Possible side-effects:   Bleeding from needle site  Infection (rare, may require surgery)  Nerve injury (rare)  Numbness & tingling (temporary)  Difficulty urinating (rare, temporary)  Spinal headache (a headache worse with upright posture)  Light-headedness (temporary)  Pain at injection site (serveral days)  Decreased blood  pressure (rare, temporary)  Weakness in arm/leg (temporary)  Pressure sensation in back/neck (temporary)   Call if you experience:   Fever/chills associated with headache or increased back/neck pain  Headache worsened by an upright position  [place]  onset, weakness or numbness of an extremity below the injection site  Hives or difficulty breathing (go to the emergency room)  Inflammation or drainage at the injection site(s)  Severe back/neck pain greater than usual  New symptoms which are concerning to you  Please note:  Although the local anesthetic injected can often make your back or neck feel good for several hours after the injection, the pain will likely return. It takes 3-7 days for steroids to work.  You may not notice any pain relief for at least one week.  If effective, we will often do a series of 2-3 injections spaced 3-6 weeks apart to maximally decrease your pain.  After the initial series, you may be a candidate for a more permanent nerve block of the facets.  If you have any questions, please call #[phone] [facility]

## 2015-12-30 NOTE — Progress Notes (Signed)
Safety precautions to be maintained throughout the outpatient stay will include: orient to surroundings, keep bed in low position, maintain call bell within reach at all times, provide assistance with transfer out of bed and ambulation.  

## 2015-12-30 NOTE — Telephone Encounter (Signed)
Patient called to let us know that pharmacy would not fill the medications prescribed by Dr Dossie Arbour today d/t the timing of her last fills prescribed by her PCP.  Patient states that the medications prescribed today are duplicates that PCP had written. Called to [facility] to verfiy fill dates:  Tramadol 12/01/15 and 12/30/15 (Dr Adalberto Cole Rx)  Lyrica 12/17/15 tarazodone 12/17/15 Flexeril        12/24/15  Instructed patient to bring medications to her next visit and fill Dr Adalberto Cole Rx's when dates allow.

## 2015-12-30 NOTE — Telephone Encounter (Signed)
Called patient to straighten out instructions.  See phone call.

## 2015-12-30 NOTE — Patient Instructions (Signed)
GENERAL RISKS AND COMPLICATIONS  What are the risk, side effects and possible complications? Generally speaking, most procedures are safe.  However, with any procedure there are risks, side effects, and the possibility of complications.  The risks and complications are dependent upon the sites that are lesioned, or the type of nerve block to be performed.  The closer the procedure is to the spine, the more serious the risks are.  Great care is taken when placing the radio frequency needles, block needles or lesioning probes, but sometimes complications can occur. 1. Infection: Any time there is an injection through the skin, there is a risk of infection.  This is why sterile conditions are used for these blocks.  There are four possible types of infection. 1. Localized skin infection. 2. Central Nervous System Infection-This can be in the form of Meningitis, which can be deadly. 3. Epidural Infections-This can be in the form of an epidural abscess, which can cause pressure inside of the spine, causing compression of the spinal cord with subsequent paralysis. This would require an emergency surgery to decompress, and there are no guarantees that the patient would recover from the paralysis. 4. Discitis-This is an infection of the intervertebral discs.  It occurs in about 1% of discography procedures.  It is difficult to treat and it may lead to surgery.        2. Pain: the needles have to go through skin and soft tissues, will cause soreness.       3. Damage to internal structures:  The nerves to be lesioned may be near blood vessels or    other nerves which can be potentially damaged.       4. Bleeding: Bleeding is more common if the patient is taking blood thinners such as  aspirin, Coumadin, Ticiid, Plavix, etc., or if he/she have some genetic predisposition  such as hemophilia. Bleeding into the spinal canal can cause compression of the spinal  cord with subsequent paralysis.  This would require an  emergency surgery to  decompress and there are no guarantees that the patient would recover from the  paralysis.       5. Pneumothorax:  Puncturing of a lung is a possibility, every time a needle is introduced in  the area of the chest or upper back.  Pneumothorax refers to free air around the  collapsed lung(s), inside of the thoracic cavity (chest cavity).  Another two possible  complications related to a similar event would include: Hemothorax and Chylothorax.   These are variations of the Pneumothorax, where instead of air around the collapsed  lung(s), you may have blood or chyle, respectively.       6. Spinal headaches: They may occur with any procedures in the area of the spine.       7. Persistent CSF (Cerebro-Spinal Fluid) leakage: This is a rare problem, but may occur  with prolonged intrathecal or epidural catheters either due to the formation of a fistulous  track or a dural tear.       8. Nerve damage: By working so close to the spinal cord, there is always a possibility of  nerve damage, which could be as serious as a permanent spinal cord injury with  paralysis.       9. Death:  Although rare, severe deadly allergic reactions known as "Anaphylactic  reaction" can occur to any of the medications used.      10. Worsening of the symptoms:  We can always make thing worse.    What are the chances of something like this happening? Chances of any of this occuring are extremely low.  By statistics, you have more of a chance of getting killed in a motor vehicle accident: while driving to the hospital than any of the above occurring .  Nevertheless, you should be aware that they are possibilities.  In general, it is similar to taking a shower.  Everybody knows that you can slip, hit your head and get killed.  Does that mean that you should not shower again?  Nevertheless always keep in mind that statistics do not mean anything if you happen to be on the wrong side of them.  Even if a procedure has a 1  (one) in a 1,000,000 (million) chance of going wrong, it you happen to be that one..Also, keep in mind that by statistics, you have more of a chance of having something go wrong when taking medications.  Who should not have this procedure? If you are on a blood thinning medication (e.g. Coumadin, Plavix, see list of "Blood Thinners"), or if you have an active infection going on, you should not have the procedure.  If you are taking any blood thinners, please inform your physician.  How should I prepare for this procedure?  Do not eat or drink anything at least six hours prior to the procedure.  Bring a driver with you .  It cannot be a taxi.  Come accompanied by an adult that can drive you back, and that is strong enough to help you if your legs get weak or numb from the local anesthetic.  Take all of your medicines the morning of the procedure with just enough water to swallow them.  If you have diabetes, make sure that you are scheduled to have your procedure done first thing in the morning, whenever possible.  If you have diabetes, take only half of your insulin dose and notify our nurse that you have done so as soon as you arrive at the clinic.  If you are diabetic, but only take blood sugar pills (oral hypoglycemic), then do not take them on the morning of your procedure.  You may take them after you have had the procedure.  Do not take aspirin or any aspirin-containing medications, at least eleven (11) days prior to the procedure.  They may prolong bleeding.  Wear loose fitting clothing that may be easy to take off and that you would not mind if it got stained with Betadine or blood.  Do not wear any jewelry or perfume  Remove any nail coloring.  It will interfere with some of our monitoring equipment.  NOTE: Remember that this is not meant to be interpreted as a complete list of all possible complications.  Unforeseen problems may occur.  BLOOD THINNERS The following drugs  contain aspirin or other products, which can cause increased bleeding during surgery and should not be taken for 2 weeks prior to and 1 week after surgery.  If you should need take something for relief of minor pain, you may take acetaminophen which is found in Tylenol,m Datril, Anacin-3 and Panadol. It is not blood thinner. The products listed below are.  Do not take any of the products listed below in addition to any listed on your instruction sheet.  A.P.C or A.P.C with Codeine Codeine Phosphate Capsules #3 Ibuprofen Ridaura  ABC compound Congesprin Imuran rimadil  Advil Cope Indocin Robaxisal  Alka-Seltzer Effervescent Pain Reliever and Antacid Coricidin or Coricidin-D  Indomethacin Rufen    Alka-Seltzer plus Cold Medicine Cosprin Ketoprofen S-A-C Tablets  Anacin Analgesic Tablets or Capsules Coumadin Korlgesic Salflex  Anacin Extra Strength Analgesic tablets or capsules CP-2 Tablets Lanoril Salicylate  Anaprox Cuprimine Capsules Levenox Salocol  Anexsia-D Dalteparin Magan Salsalate  Anodynos Darvon compound Magnesium Salicylate Sine-off  Ansaid Dasin Capsules Magsal Sodium Salicylate  Anturane Depen Capsules Marnal Soma  APF Arthritis pain formula Dewitt's Pills Measurin Stanback  Argesic Dia-Gesic Meclofenamic Sulfinpyrazone  Arthritis Bayer Timed Release Aspirin Diclofenac Meclomen Sulindac  Arthritis pain formula Anacin Dicumarol Medipren Supac  Analgesic (Safety coated) Arthralgen Diffunasal Mefanamic Suprofen  Arthritis Strength Bufferin Dihydrocodeine Mepro Compound Suprol  Arthropan liquid Dopirydamole Methcarbomol with Aspirin Synalgos  ASA tablets/Enseals Disalcid Micrainin Tagament  Ascriptin Doan's Midol Talwin  Ascriptin A/D Dolene Mobidin Tanderil  Ascriptin Extra Strength Dolobid Moblgesic Ticlid  Ascriptin with Codeine Doloprin or Doloprin with Codeine Momentum Tolectin  Asperbuf Duoprin Mono-gesic Trendar  Aspergum Duradyne Motrin or Motrin IB Triminicin  Aspirin  plain, buffered or enteric coated Durasal Myochrisine Trigesic  Aspirin Suppositories Easprin Nalfon Trillsate  Aspirin with Codeine Ecotrin Regular or Extra Strength Naprosyn Uracel  Atromid-S Efficin Naproxen Ursinus  Auranofin Capsules Elmiron Neocylate Vanquish  Axotal Emagrin Norgesic Verin  Azathioprine Empirin or Empirin with Codeine Normiflo Vitamin E  Azolid Emprazil Nuprin Voltaren  Bayer Aspirin plain, buffered or children's or timed BC Tablets or powders Encaprin Orgaran Warfarin Sodium  Buff-a-Comp Enoxaparin Orudis Zorpin  Buff-a-Comp with Codeine Equegesic Os-Cal-Gesic   Buffaprin Excedrin plain, buffered or Extra Strength Oxalid   Bufferin Arthritis Strength Feldene Oxphenbutazone   Bufferin plain or Extra Strength Feldene Capsules Oxycodone with Aspirin   Bufferin with Codeine Fenoprofen Fenoprofen Pabalate or Pabalate-SF   Buffets II Flogesic Panagesic   Buffinol plain or Extra Strength Florinal or Florinal with Codeine Panwarfarin   Buf-Tabs Flurbiprofen Penicillamine   Butalbital Compound Four-way cold tablets Penicillin   Butazolidin Fragmin Pepto-Bismol   Carbenicillin Geminisyn Percodan   Carna Arthritis Reliever Geopen Persantine   Carprofen Gold's salt Persistin   Chloramphenicol Goody's Phenylbutazone   Chloromycetin Haltrain Piroxlcam   Clmetidine heparin Plaquenil   Cllnoril Hyco-pap Ponstel   Clofibrate Hydroxy chloroquine Propoxyphen         Before stopping any of these medications, be sure to consult the physician who ordered them.  Some, such as Coumadin (Warfarin) are ordered to prevent or treat serious conditions such as "deep thrombosis", "pumonary embolisms", and other heart problems.  The amount of time that you may need off of the medication may also vary with the medication and the reason for which you were taking it.  If you are taking any of these medications, please make sure you notify your pain physician before you undergo any  procedures.         Facet Blocks Patient Information  Description: The facets are joints in the spine between the vertebrae.  Like any joints in the body, facets can become irritated and painful.  Arthritis can also effect the facets.  By injecting steroids and local anesthetic in and around these joints, we can temporarily block the nerve supply to them.  Steroids act directly on irritated nerves and tissues to reduce selling and inflammation which often leads to decreased pain.  Facet blocks may be done anywhere along the spine from the neck to the low back depending upon the location of your pain.   After numbing the skin with local anesthetic (like Novocaine), a small needle is passed onto the facet joints under x-ray guidance.    You may experience a sensation of pressure while this is being done.  The entire block usually lasts about 15-25 minutes.   Conditions which may be treated by facet blocks:   Low back/buttock pain  Neck/shoulder pain  Certain types of headaches  Preparation for the injection:  1. Do not eat any solid food or dairy products within 8 hours of your appointment. 2. You may drink clear liquid up to 3 hours before appointment.  Clear liquids include water, black coffee, juice or soda.  No milk or cream please. 3. You may take your regular medication, including pain medications, with a sip of water before your appointment.  Diabetics should hold regular insulin (if taken separately) and take 1/2 normal NPH dose the morning of the procedure.  Carry some sugar containing items with you to your appointment. 4. A driver must accompany you and be prepared to drive you home after your procedure. 5. Bring all your current medications with you. 6. An IV may be inserted and sedation may be given at the discretion of the physician. 7. A blood pressure cuff, EKG and other monitors will often be applied during the procedure.  Some patients may need to have extra oxygen  administered for a short period. 8. You will be asked to provide medical information, including your allergies and medications, prior to the procedure.  We must know immediately if you are taking blood thinners (like Coumadin/Warfarin) or if you are allergic to IV iodine contrast (dye).  We must know if you could possible be pregnant.  Possible side-effects:   Bleeding from needle site  Infection (rare, may require surgery)  Nerve injury (rare)  Numbness & tingling (temporary)  Difficulty urinating (rare, temporary)  Spinal headache (a headache worse with upright posture)  Light-headedness (temporary)  Pain at injection site (serveral days)  Decreased blood pressure (rare, temporary)  Weakness in arm/leg (temporary)  Pressure sensation in back/neck (temporary)   Call if you experience:   Fever/chills associated with headache or increased back/neck pain  Headache worsened by an upright position  New onset, weakness or numbness of an extremity below the injection site  Hives or difficulty breathing (go to the emergency room)  Inflammation or drainage at the injection site(s)  Severe back/neck pain greater than usual  New symptoms which are concerning to you  Please note:  Although the local anesthetic injected can often make your back or neck feel good for several hours after the injection, the pain will likely return. It takes 3-7 days for steroids to work.  You may not notice any pain relief for at least one week.  If effective, we will often do a series of 2-3 injections spaced 3-6 weeks apart to maximally decrease your pain.  After the initial series, you may be a candidate for a more permanent nerve block of the facets.  If you have any questions, please call [phone] [facility] 

## 2015-12-30 NOTE — Telephone Encounter (Signed)
Pt says she cant get meds to be refilled because its too early. Pt says Dr Dossie Arbour told her to get rid of the meds her primary care doctor gave her. She said he then wrote her the same scripts her primary care wrote her but they will not fill them at the pharmacy

## 2016-01-26 NOTE — Progress Notes (Signed)
Patient's Name: Deanna Jacobson  Patient type: Established  MRN: [id number]  Service setting: Ambulatory outpatient  DOB: 1955-07-23  Location: [facility]  DOS: 01/27/2016  Primary Care Physician: [place]  Note by: Kathlen Brunswick. Dossie Arbour, M.D  Referring Physician: [facility] [place]: Interventional Pain Management  Last Visit to Pain Management: 12/30/2015    Primary Reason(s) for Visit: Interventional Pain Management Treatment. CC: Back Pain (low, middle)  Long term current use of opiate analgesic [Z79.891]   Procedure:  Anesthesia, Analgesia, Anxiolysis:  Type: Therapeutic Medial Branch Facet Block Region:Thoracic Level: T12, L1, L2, & L3 Medial Branch Level(s) Laterality: Bilateral  Indications: 1. Long term current use of opiate analgesic   2. Chronic pain   3. Musculoskeletal pain   4. Neurogenic pain   5. Primary osteoarthritis involving multiple joints   6. Opiate use   7. Chronic mid back pain   8. Chronic thoracic spine pain (midline)   9. Lumbar facet arthropathy   10. Thoracic facet syndrome     Pre-procedure Pain Score: 5/10 Reported level of pain is compatible with clinical observations Post-procedure Pain Score: 0/10  Type: Moderate (Conscious) Sedation & Local Anesthesia Local Anesthetic: Lidocaine 1% Route: Intravenous (IV) IV Access: Secured Sedation: Meaningful verbal contact was maintained at all times during the procedure  Indication(s): Analgesia & Anxiolysis   Pre-Procedure Assessment:  Deanna Jacobson is a 60 y.o. year old, female patient, seen today for interventional treatment. She has Acute renal failure (ARF) ([place]); Chronic pain; Long term current use of opiate analgesic; Long term prescription opiate use; Opiate use; Encounter for therapeutic drug level monitoring; Encounter for pain management planning; Chronic mid back pain; Chronic thoracic spine pain (midline); Thoracic facet syndrome; Lumbar  facet arthropathy; Musculoskeletal pain; Neurogenic pain; and Osteoarthritis on her problem list.. Her primarily concern today is the Back Pain (low, middle)   Pain Type: Chronic pain Pain Location: Back Pain Orientation: Lower Pain Descriptors / Indicators: Burning, Aching Pain Frequency: Constant  Date of Last Visit: 12/30/15 Service Provided on Last Visit: Med Refill  Coagulation Parameters Lab Results  Component Value Date   PLT 264 10/28/2015    Verification of the correct person, correct site (including marking of site), and correct procedure were performed and confirmed by the patient.  Consent: Secured. Under the influence of no sedatives a written informed consent was obtained, after having provided information on the risks and possible complications. To fulfill our ethical and legal obligations, as recommended by the American Medical Association's Code of Ethics, we have provided information to the patient about our clinical impression; the nature and purpose of the treatment or procedure; the risks, benefits, and possible complications of the intervention; alternatives; the risk(s) and benefit(s) of the alternative treatment(s) or procedure(s); and the risk(s) and benefit(s) of doing nothing. The patient was provided information about the risks and possible complications associated with the procedure. These include, but are not limited to, failure to achieve desired goals, infection, bleeding, organ or nerve damage, allergic reactions, paralysis, and death. In the case of spinal procedures these may include, but are not limited to, failure to achieve desired goals, infection, bleeding, organ or nerve damage, allergic reactions, paralysis, and death. In the case of intra- or periarticular procedures these may include, but are not limited to, failure to achieve desired goals, infection, bleeding (hemarthrosis), organ or nerve damage, allergic reactions, and death. In addition, the  patient was informed that Medicine is not an Chief Strategy Officer;  therefore, there is also the possibility of unforeseen risks and possible complications that may result in a catastrophic outcome. The patient indicated having understood very clearly. We have given the patient no guarantees and we have made no promises. Enough time was given to the patient to ask questions, all of which were answered to the patient's satisfaction.  Consent Attestation: I, the ordering provider, attest that I have discussed with the patient the benefits, risks, side-effects, alternatives, likelihood of achieving goals, and potential problems during recovery for the procedure that I have provided informed consent.  Pre-Procedure Preparation: Safety Precautions: Allergies reviewed. Appropriate site, procedure, and patient were confirmed by following the Joint Commission's Universal Protocol (UP.01.01.01), in the form of a "Time Out". The patient was asked to confirm marked site and procedure, before commencing. The patient was asked about blood thinners, or active infections, both of which were denied. Patient was assessed for positional comfort and all pressure points were checked before starting procedure. Allergies: She is allergic to codeine.. Infection Control Precautions: Sterile technique used. Standard Universal Precautions were taken as recommended by the Department of [facility] for Disease Control and Prevention (CDC). Standard pre-surgical skin prep was conducted. Respiratory hygiene and cough etiquette was practiced. Hand hygiene observed. Safe injection practices and needle disposal techniques followed. SDV (single dose vial) medications used. Medications properly checked for expiration dates and contaminants. Personal protective equipment (PPE) used: Sterile Radiation-resistant gloves. Monitoring:  As per clinic protocol. Vitals:   01/27/16 0925 01/27/16 0927  BP:  (!) 181/77  Pulse:  92  Resp:  18   Temp:  98.3 F (36.8 C)  SpO2:  100%  Weight: 117 lb (53.1 kg)   Height: '5\' 1"'  (1.549 m)   Calculated BMI: Body mass index is 22.11 kg/m.  Description of Procedure Process:   Time-out: "Time-out" completed before starting procedure, as per protocol. Position: Prone Target Area: The target area is a superior and lateral portion of the thoracic transverse processes more distal to the joint itself then and the lumbar region. Approach: Paraspinal approach. Area Prepped: Entire Posterior Thoracic Region Prepping solution: ChloraPrep (2% chlorhexidine gluconate and 70% isopropyl alcohol) Safety Precautions: Aspiration looking for blood return was conducted prior to all injections. At no point did we inject any substances, as a needle was being advanced. No attempts were made at seeking any paresthesias. Safe injection practices and needle disposal techniques used. Medications properly checked for expiration dates. SDV (single dose vial) medications used.   Description of the Procedure: Protocol guidelines were followed. The patient was placed in position over the fluoroscopy table. The target area was identified and the area prepped in the usual manner. Skin desensitized using vapocoolant spray. Skin & deeper tissues infiltrated with local anesthetic. Appropriate amount of time allowed to pass for local anesthetics to take effect. The procedure needles were then advanced to the target area. Proper needle placement secured. Negative aspiration confirmed. Solution injected in intermittent fashion, asking for systemic symptoms every 0.5cc of injectate. The needles were then removed and the area cleansed, making sure to leave some of the prepping solution back to take advantage of its long term bactericidal properties. EBL: None Materials & Medications Used:  Needle(s) Used: 22g - 3.5" Spinal Needle(s) Medication(s): Please see chart orders for medication and dosing details.  Imaging Guidance:  Type  of Imaging Technique: Fluoroscopy Guidance (Spinal) Indication(s): Assistance in needle guidance and placement for procedures requiring needle placement in or near specific anatomical locations not easily accessible without  such assistance. Exposure Time: Please see nurses notes. Contrast: None required. Fluoroscopic Guidance: I was personally present in the fluoroscopy suite, where the patient was placed in position for the procedure, over the fluoroscopy-compatible table. Fluoroscopy was manipulated, using "Tunnel Vision Technique", to obtain the best possible view of the target area, on the affected side. Parallax error was corrected before commencing the procedure. A "direction-depth-direction" technique was used to introduce the needle under continuous pulsed fluoroscopic guidance. Once the target was reached, antero-posterior, oblique, and lateral fluoroscopic projection views were taken to confirm needle placement in all planes. Permanently recorded images stored by scanning into EMR. Interpretation: Intraoperative imaging interpretation by performing Physician. Adequate needle placement confirmed. No contrast injected.  Antibiotic Prophylaxis:  Indication(s): No indications identified. Type:  Antibiotics Given (last 72 hours)    None       Post-operative Assessment:   Complications: No immediate post-treatment complications were observed. Disposition: Return to clinic for follow-up evaluation. The patient tolerated the entire procedure well. A repeat set of vitals were taken after the procedure and the patient was kept under observation following institutional policy, for this procedure. Post-procedural neurological assessment was performed, showing return to baseline, prior to discharge. The patient was discharged home, once institutional criteria were met. The patient was provided with post-procedure discharge instructions, including a section on how to identify potential problems. Should  any problems arise concerning this procedure, the patient was given instructions to immediately contact us, at any time, without hesitation. In any case, we plan to contact the patient by telephone for a follow-up status report regarding this interventional procedure. Comments:  No additional relevant information.  Medications administered during this visit: Ms. Steinmiller had no medications administered during this visit.  Prescriptions ordered during this visit: New Prescriptions   No medications on file   Second Reason(s) for Visit: Encounter for prescription drug management (Level of risk: moderate) CC: Back Pain (low, middle)   HPI  Ms. Pearse is a 60 y.o. year old, female patient, who returns today as an established patient. She has Acute renal failure (ARF) ([place]); Chronic pain; Long term current use of opiate analgesic; Long term prescription opiate use; Opiate use; Encounter for therapeutic drug level monitoring; Encounter for pain management planning; Chronic mid back pain; Chronic thoracic spine pain (midline); Thoracic facet syndrome; Lumbar facet arthropathy; Musculoskeletal pain; Neurogenic pain; and Osteoarthritis on her problem list.. Her primarily concern today is the Back Pain (low, middle)   Pain Assessment: Self-Reported Pain Score: 5/10 (Before procedure)             Reported level is compatible with observation       Pain Type: Chronic pain Pain Location: Back Pain Orientation: Lower Pain Descriptors / Indicators: Burning, Aching Pain Frequency: Constant  The patient comes into the clinics today for pharmacological management of her chronic pain. I last saw this patient on 12/30/2015. The patient  reports that she uses drugs. Her body mass index is 22.11 kg/m.  Date of Last Visit: 12/30/15 Service Provided on Last Visit: Med Refill  Controlled Substance Pharmacotherapy Assessment & REMS (Risk Evaluation and Mitigation Strategy)  Analgesic:Tramadol 50 mg 1  every 6 hours (200 mg/day of tramadol) MME/day:20 mg/day Pill Count: Bottle labeled tramadol 50 mg  # 65/90  Filled 12-30-15. (21 day supply. It should last until: 02/17/16, if she is using it TID) (She is actually taking it less than 1/day, in which case it should last for another 65 days) Pharmacokinetics: Onset of action (Liberation/Absorption):  Within expected pharmacological parameters Time to Peak effect (Distribution): Timing and results are as within normal expected parameters Duration of action (Metabolism/Excretion): Within normal limits for medication Pharmacodynamics: Analgesic Effect: More than 50% Activity Facilitation: Medication(s) allow patient to sit, stand, walk, and do the basic ADLs Perceived Effectiveness: Described as relatively effective, allowing for increase in activities of daily living (ADL) Side-effects or Adverse reactions: None reported Monitoring: [place] PMP: Online review of the past 103-monthperiod conducted. Compliant with practice rules and regulations Last UDS on record: No results found for: TOXASSSELUR UDS interpretation: Compliant          Medication Assessment Form: Reviewed. Patient indicates being compliant with therapy Treatment compliance: Compliant Risk Assessment: Aberrant Behavior: None observed today Substance Use Disorder (SUD) Risk Level: No change since last visit Risk of opioid abuse or dependence: 0.7-3.0% with doses ? 36 MME/day and 6.1-26% with doses ? 120 MME/day. Opioid Risk Tool (ORT) Score: Total Score: 0 Low Risk for SUD (Score <3) Depression Scale Score: PHQ-2: PHQ-2 Total Score: 0 No depression (0) PHQ-9: PHQ-9 Total Score: 0 No depression (0-4)  Pharmacologic Plan: No change in therapy, at this time  Laboratory Chemistry  Inflammation Markers Lab Results  Component Value Date   ESRSEDRATE 25 12/05/2015   CRP 0.5 12/05/2015    Renal Function Lab Results  Component Value Date   BUN 11 10/29/2015   CREATININE 0.82  10/29/2015   GFRAA >60 10/29/2015   GFRNONAA >60 10/29/2015    Hepatic Function Lab Results  Component Value Date   AST 16 10/28/2015   ALT 11 (L) 10/28/2015   ALBUMIN 3.7 10/28/2015    Electrolytes Lab Results  Component Value Date   NA 135 10/29/2015   K 5.0 10/29/2015   CL 105 10/29/2015   CALCIUM 8.1 (L) 10/29/2015   MG 1.8 12/05/2015    Pain Modulating Vitamins Lab Results  Component Value Date   25OHVITD1 59 12/05/2015   25OHVITD2 <1.0 12/05/2015   25OHVITD3 59 12/05/2015   VITAMINB12 354 12/05/2015    Coagulation Parameters Lab Results  Component Value Date   PLT 264 10/28/2015    Cardiovascular Lab Results  Component Value Date   HGB 10.1 (L) 10/28/2015   HCT 28.5 (L) 10/28/2015    Note: Lab results reviewed.  Recent Diagnostic Imaging  No results found.  Meds  The patient has a current medication list which includes the following prescription(s): lisinopril, tramadol, trazodone, cyclobenzaprine, lyrica, and meloxicam, and the following Facility-Administered Medications: dexamethasone, fentanyl, lactated ringers, lidocaine (pf), midazolam, and ropivacaine (pf) 2 mg/ml (0.2%).  Current Outpatient Prescriptions on File Prior to Visit  Medication Sig  . lisinopril (PRINIVIL,ZESTRIL) 20 MG tablet Take 1 tablet (20 mg total) by mouth daily.  . traMADol (ULTRAM) 50 MG tablet Take 1 tablet (50 mg total) by mouth every 8 (eight) hours as needed for severe pain.  . traZODone (DESYREL) 100 MG tablet Take 100 mg by mouth at bedtime.    No current facility-administered medications on file prior to visit.     ROS  Constitutional: Denies any fever or chills Gastrointestinal: No reported hemesis, hematochezia, vomiting, or acute GI distress Musculoskeletal: Denies any acute onset joint swelling, redness, loss of ROM, or weakness Neurological: No reported episodes of acute onset apraxia, aphasia, dysarthria, agnosia, amnesia, paralysis, loss of coordination,  or loss of consciousness  Allergies  Ms. Secrist is allergic to codeine.  [facility] [place] [facility]:  Ms. TFarabaugh has a past medical history of Chronic back  pain and Hypertension. Family: family history includes Cancer in her mother; Hypertension in her father. Surgical:  has a past surgical history that includes Breast enhancement surgery. Tobacco:  reports that she has quit smoking. She has never used smokeless tobacco. Alcohol:  reports that she drinks alcohol. Drug:  reports that she uses drugs.  Constitutional Exam  Vitals: Blood pressure (!) 182/87, pulse 92, temperature 98.3 F (36.8 C), resp. rate 16, height '5\' 1"'  (1.549 m), weight 117 lb (53.1 kg), SpO2 99 %. General appearance: Well nourished, well developed, and well hydrated. In no acute distress Calculated BMI/Body habitus: Body mass index is 22.11 kg/m. (18.5-24.9 kg/m2) Ideal body weight Psych/Mental status: Alert and oriented x 3 (person, place, & time) Eyes: PERLA Respiratory: No evidence of acute respiratory distress  Cervical Spine Exam  Inspection: No masses, redness, or swelling Alignment: Symmetrical Functional ROM: ROM appears unrestricted Stability: No instability detected Muscle strength & Tone: Functionally intact Sensory: Unimpaired Palpation: Non-contributory  Upper Extremity (UE) Exam    Side: Right upper extremity  Side: Left upper extremity  Inspection: No masses, redness, swelling, or asymmetry  Inspection: No masses, redness, swelling, or asymmetry  Functional ROM: ROM appears unrestricted  Functional ROM: ROM appears unrestricted  Muscle strength & Tone: Functionally intact  Muscle strength & Tone: Functionally intact  Sensory: Unimpaired  Sensory: Unimpaired  Palpation: Non-contributory  Palpation: Non-contributory   Thoracic Spine Exam  Inspection: No masses, redness, or swelling Alignment: Symmetrical Functional ROM: ROM appears unrestricted Stability: No instability detected Sensory:  Unimpaired Muscle strength & Tone: Functionally intact Palpation: Non-contributory  Lumbar Spine Exam  Inspection: No masses, redness, or swelling Alignment: Symmetrical Functional ROM: ROM appears unrestricted Stability: No instability detected Muscle strength & Tone: Functionally intact Sensory: Unimpaired Palpation: Non-contributory Provocative Tests: Lumbar Hyperextension and rotation test: evaluation deferred today       Patrick's Maneuver: evaluation deferred today              Gait & Posture Assessment  Ambulation: Unassisted Gait: Relatively normal for age and body habitus Posture: WNL   Lower Extremity Exam    Side: Right lower extremity  Side: Left lower extremity  Inspection: No masses, redness, swelling, or asymmetry  Inspection: No masses, redness, swelling, or asymmetry  Functional ROM: ROM appears unrestricted  Functional ROM: ROM appears unrestricted  Muscle strength & Tone: Functionally intact  Muscle strength & Tone: Functionally intact  Sensory: Unimpaired  Sensory: Unimpaired  Palpation: Non-contributory  Palpation: Non-contributory    Assessment & Plan  Primary Diagnosis & Pertinent Problem List: The primary encounter diagnosis was Long term current use of opiate analgesic. Diagnoses of Chronic pain, Musculoskeletal pain, Neurogenic pain, Primary osteoarthritis involving multiple joints, Opiate use, Chronic mid back pain, Chronic thoracic spine pain (midline), Lumbar facet arthropathy, and Thoracic facet syndrome were also pertinent to this visit.  Visit Diagnosis: 1. Long term current use of opiate analgesic   2. Chronic pain   3. Musculoskeletal pain   4. Neurogenic pain   5. Primary osteoarthritis involving multiple joints   6. Opiate use   7. Chronic mid back pain   8. Chronic thoracic spine pain (midline)   9. Lumbar facet arthropathy   10. Thoracic facet syndrome     Problems updated and reviewed during this visit: No problems  updated.  Problem-specific Plan(s): No problem-specific Assessment & Plan notes found for this encounter.  No new Assessment & Plan notes have been filed under this hospital service since the last note was  generated. Service: Pain Management   Plan of Care   Problem List Items Addressed This Visit      High   Chronic mid back pain (Chronic)   Relevant Medications   cyclobenzaprine (FLEXERIL) 10 MG tablet   meloxicam (MOBIC) 15 MG tablet   fentaNYL (SUBLIMAZE) injection 25-50 mcg   dexamethasone (DECADRON) injection 10 mg   fentaNYL (SUBLIMAZE) 100 MCG/2ML injection (Completed)   dexamethasone (DECADRON) 10 MG/ML injection (Completed)   Chronic pain (Chronic)   Relevant Medications   cyclobenzaprine (FLEXERIL) 10 MG tablet   LYRICA 75 MG capsule   meloxicam (MOBIC) 15 MG tablet   fentaNYL (SUBLIMAZE) injection 25-50 mcg   dexamethasone (DECADRON) injection 10 mg   lidocaine (PF) (XYLOCAINE) 1 % injection 10 mL   ropivacaine (PF) 2 mg/ml (0.2%) (NAROPIN) epidural 9 mL   ropivacaine (PF) 2 mg/ml (0.2%) (NAROPIN) 2 MG/ML epidural (Completed)   fentaNYL (SUBLIMAZE) 100 MCG/2ML injection (Completed)   dexamethasone (DECADRON) 10 MG/ML injection (Completed)   Chronic thoracic spine pain (midline) (Chronic)   Relevant Medications   cyclobenzaprine (FLEXERIL) 10 MG tablet   meloxicam (MOBIC) 15 MG tablet   fentaNYL (SUBLIMAZE) injection 25-50 mcg   lactated ringers infusion 1,000 mL   midazolam (VERSED) 5 MG/5ML injection 1-2 mg   dexamethasone (DECADRON) injection 10 mg   lidocaine (PF) (XYLOCAINE) 1 % injection 10 mL   ropivacaine (PF) 2 mg/ml (0.2%) (NAROPIN) epidural 9 mL   fentaNYL (SUBLIMAZE) 100 MCG/2ML injection (Completed)   dexamethasone (DECADRON) 10 MG/ML injection (Completed)   Other Relevant Orders   THORACIC FACET BLOCK   Lumbar facet arthropathy (Chronic)   Relevant Orders   THORACIC FACET BLOCK   Musculoskeletal pain (Chronic)   Relevant Medications    cyclobenzaprine (FLEXERIL) 10 MG tablet   Neurogenic pain (Chronic)   Relevant Medications   LYRICA 75 MG capsule   Osteoarthritis (Chronic)   Relevant Medications   cyclobenzaprine (FLEXERIL) 10 MG tablet   meloxicam (MOBIC) 15 MG tablet   fentaNYL (SUBLIMAZE) injection 25-50 mcg   dexamethasone (DECADRON) injection 10 mg   fentaNYL (SUBLIMAZE) 100 MCG/2ML injection (Completed)   dexamethasone (DECADRON) 10 MG/ML injection (Completed)   Thoracic facet syndrome (Chronic)   Relevant Medications   cyclobenzaprine (FLEXERIL) 10 MG tablet   meloxicam (MOBIC) 15 MG tablet   fentaNYL (SUBLIMAZE) injection 25-50 mcg   dexamethasone (DECADRON) injection 10 mg   fentaNYL (SUBLIMAZE) 100 MCG/2ML injection (Completed)   dexamethasone (DECADRON) 10 MG/ML injection (Completed)   Other Relevant Orders   THORACIC FACET BLOCK     Medium   Long term current use of opiate analgesic - Primary (Chronic)   Relevant Orders   ToxASSURE Select 13 (MW), Urine   Opiate use (Chronic)    Other Visit Diagnoses   None.      Pharmacotherapy (Medications Ordered): Meds ordered this encounter  Medications  . cyclobenzaprine (FLEXERIL) 10 MG tablet    Sig: Take 1 tablet (10 mg total) by mouth every 8 (eight) hours as needed for muscle spasms.    Dispense:  90 tablet    Refill:  0    Do not add this medication to the electronic "Automatic Refill" notification system. Patient may have prescription filled one day early if pharmacy is closed on scheduled refill date.  Marland Kitchen LYRICA 75 MG capsule    Sig: Take 1 capsule (75 mg total) by mouth daily.    Dispense:  60 capsule    Refill:  0    Do  not add this medication to the electronic "Automatic Refill" notification system. Patient may have prescription filled one day early if pharmacy is closed on scheduled refill date.  . meloxicam (MOBIC) 15 MG tablet    Sig: Take 1 tablet (15 mg total) by mouth daily.    Dispense:  30 tablet    Refill:  0    Do not add  this medication to the electronic "Automatic Refill" notification system. Patient may have prescription filled one day early if pharmacy is closed on scheduled refill date.  . fentaNYL (SUBLIMAZE) injection 25-50 mcg    Make sure Narcan is available in the pyxis when using this medication. In the event of respiratory depression (RR< 8/min): Titrate NARCAN (naloxone) in increments of 0.1 to 0.2 mg IV at 2-3 minute intervals, until desired degree of reversal.  . lactated ringers infusion 1,000 mL  . midazolam (VERSED) 5 MG/5ML injection 1-2 mg    Make sure Flumazenil is available in the pyxis when using this medication. If oversedation occurs, administer 0.2 mg IV over 15 sec. If after 45 sec no response, administer 0.2 mg again over 1 min; may repeat at 1 min intervals; not to exceed 4 doses (1 mg)  . dexamethasone (DECADRON) injection 10 mg  . lidocaine (PF) (XYLOCAINE) 1 % injection 10 mL  . ropivacaine (PF) 2 mg/ml (0.2%) (NAROPIN) epidural 9 mL  . ropivacaine (PF) 2 mg/ml (0.2%) (NAROPIN) 2 MG/ML epidural    Willeen Cass L: cabinet override  . fentaNYL (SUBLIMAZE) 100 MCG/2ML injection    Willeen Cass L: cabinet override  . dexamethasone (DECADRON) 10 MG/ML injection    Willeen Cass L: cabinet override  . midazolam (VERSED) 5 MG/5ML injection    Willeen Cass L: cabinet override    Lab-work & Procedure Ordered: Orders Placed This Encounter  Procedures  . THORACIC FACET BLOCK  . ToxASSURE Select 13 (MW), Urine    Imaging Ordered: INFORMED CONSENT DETAILS: TRANSCRIBE TO CONSENT FORM AND OBTAIN PATIENT SIGNATURE  Interventional Therapies: Scheduled:None at this point.   Considering: Diagnostic bilateral thoraco-lumbar facet block under fluoroscopic guidance and IV sedation #2. Possible bilateral thoraco-lumbar facet radiofrequency ablation depending on the results of the diagnostic injection.    PRN Procedures:None at this time.    Referral(s) or Consult(s):  None at this time.  New Prescriptions   No medications on file    Medications administered during this visit: We administered ropivacaine (PF) 2 mg/ml (0.2%), fentaNYL, dexamethasone, and midazolam.  Requested PM Follow-up: Return in about 2 weeks (around 02/10/2016) for Post-Procedure evaluation, In addition, Med-Mgmt in 1 month..  Future Appointments Date Time Provider [place]  02/18/2016 11:20 AM Milinda Pointer, MD [facility] None    Primary Care Physician: [facility] Location: [facility] Facility Note by: Kathlen Brunswick. Dossie Arbour, M.D, DABA, DABAPM, DABPM, DABIPP, FIPP  Pain Score Disclaimer: We use the NRS-11 scale. This is a self-reported, subjective measurement of pain severity with only modest accuracy. It is used primarily to identify changes within a particular patient. It must be understood that outpatient pain scales are significantly less accurate that those used for research, where they can be applied under ideal controlled circumstances with minimal exposure to variables. In reality, the score is likely to be a combination of pain intensity and pain affect, where pain affect describes the degree of emotional arousal or changes in action readiness caused by the sensory experience of pain. Factors such as social and work situation, setting, emotional state, anxiety  levels, expectation, and prior pain experience may influence pain perception and show large inter-individual differences that may also be affected by time variables.  Patient instructions provided during this appointment: Patient Instructions  Facet Joint Block The facet joints connect the bones of the spine (vertebrae). They make it possible for you to bend, twist, and make other movements with your spine. They also prevent you from overbending, overtwisting, and making other excessive movements.  A facet joint block is a procedure where a numbing medicine (anesthetic) is  injected into a facet joint. Often, a type of anti-inflammatory medicine called a steroid is also injected. A facet joint block may be done for two reasons:   Diagnosis. A facet joint block may be done as a test to see whether neck or back pain is caused by a worn-down or infected facet joint. If the pain gets better after a facet joint block, it means the pain is probably coming from the facet joint. If the pain does not get better, it means the pain is probably not coming from the facet joint.   Therapy. A facet joint block may be done to relieve neck or back pain caused by a facet joint. A facet joint block is only done as a therapy if the pain does not improve with medicine, exercise programs, physical therapy, and other forms of pain management. LET [facility] CARE PROVIDER KNOW ABOUT:   Any allergies you have.   All medicines you are taking, including vitamins, herbs, eyedrops, and over-the-counter medicines and creams.   Previous problems you or members of your family have had with the use of anesthetics.   Any blood disorders you have had.   Other health problems you have. RISKS AND COMPLICATIONS Generally, having a facet joint block is safe. However, as with any procedure, complications can occur. Possible complications associated with having a facet joint block include:   Bleeding.   Injury to a nerve near the injection site.   Pain at the injection site.   Weakness or numbness in areas controlled by nerves near the injection site.   Infection.   Temporary fluid retention.   Allergic reaction to anesthetics or medicines used during the procedure. BEFORE THE PROCEDURE   Follow your health care provider's instructions if you are taking dietary supplements or medicines. You may need to stop taking them or reduce your dosage.   Do not take any new dietary supplements or medicines without asking your health care provider first.   Follow your health care  provider's instructions about eating and drinking before the procedure. You may need to stop eating and drinking several hours before the procedure.   Arrange to have an adult drive you home after the procedure. PROCEDURE  You may need to remove your clothing and dress in an open-back gown so that your health care provider can access your spine.   The procedure will be done while you are lying on an X-ray table. Most of the time you will be asked to lie on your stomach, but you may be asked to lie in a different position if an injection will be made in your neck.   Special machines will be used to monitor your oxygen levels, heart rate, and blood pressure.   If an injection will be made in your neck, an intravenous (IV) tube will be inserted into one of your veins. Fluids and medicine will flow directly into your body through the IV tube.   The area over the  facet joint where the injection will be made will be cleaned with an antiseptic soap. The surrounding skin will be covered with sterile drapes.   An anesthetic will be applied to your skin to make the injection area numb. You may feel a temporary stinging or burning sensation.   A video X-ray machine will be used to locate the joint. A contrast dye may be injected into the facet joint area to help with locating the joint.   When the joint is located, an anesthetic medicine will be injected into the joint through the needle.   Your health care provider will ask you whether you feel pain relief. If you do feel relief, a steroid may be injected to provide pain relief for a longer period of time. If you do not feel relief or feel only partial relief, additional injections of an anesthetic may be made in other facet joints.   The needle will be removed, the skin will be cleansed, and bandages will be applied.  AFTER THE PROCEDURE   You will be observed for 15-30 minutes before being allowed to go home. Do not drive. Have an adult  drive you or take a taxi or public transportation instead.   If you feel pain relief, the pain will return in several hours or days when the anesthetic wears off.   You may feel pain relief 2-14 days after the procedure. The amount of time this relief lasts varies from person to person.   It is normal to feel some tenderness over the injected area(s) for 2 days following the procedure.   If you have diabetes, you may have a temporary increase in blood sugar.   This information is not intended to replace advice given to you by your health care provider. Make sure you discuss any questions you have with your health care provider.   Document Released: 10/06/2006 Document Revised: 06/07/2014 Document Reviewed: 03/06/2012 Elsevier Interactive Patient Education 2016 [employer] Pain Management Discharge Instructions  General Discharge Instructions :  If you need to reach your doctor call: Monday-Friday 8:00 am - 4:00 pm at [phone] or toll free [phone].  After clinic hours [phone] to have operator reach doctor.  Bring all of your medication bottles to all your appointments in the pain clinic.  To cancel or reschedule your appointment with Pain Management please remember to call 24 hours in advance to avoid a fee.  Refer to the educational materials which you have been given on: General Risks, I had my Procedure. Discharge Instructions, Post Sedation.  Post Procedure Instructions:  The drugs you were given will stay in your system until tomorrow, so for the next 24 hours you should not drive, make any legal decisions or drink any alcoholic beverages.  You may eat anything you prefer, but it is better to start with liquids then soups and crackers, and gradually work up to solid foods.  Please notify your doctor immediately if you have any unusual bleeding, trouble breathing or pain that is not related to your normal pain.  Depending on the type of procedure that was  done, some parts of your body may feel week and/or numb.  This usually clears up by tonight or the next day.  Walk with the use of an assistive device or accompanied by an adult for the 24 hours.  You may use ice on the affected area for the first 24 hours.  Put ice in a Ziploc bag and cover with a towel and place against  area 15 minutes on 15 minutes off.  You may switch to heat after 24 hours.

## 2016-01-27 ENCOUNTER — Encounter: Payer: Self-pay | Admitting: Pain Medicine

## 2016-01-27 ENCOUNTER — Ambulatory Visit: Payer: BLUE CROSS/BLUE SHIELD | Attending: Pain Medicine | Admitting: Pain Medicine

## 2016-01-27 VITALS — BP 182/87 | HR 92 | Temp 98.3°F | Resp 16 | Ht 61.0 in | Wt 117.0 lb

## 2016-01-27 DIAGNOSIS — N179 Acute kidney failure, unspecified: Secondary | ICD-10-CM | POA: Diagnosis not present

## 2016-01-27 DIAGNOSIS — M5489 Other dorsalgia: Secondary | ICD-10-CM

## 2016-01-27 DIAGNOSIS — F1190 Opioid use, unspecified, uncomplicated: Secondary | ICD-10-CM

## 2016-01-27 DIAGNOSIS — M1288 Other specific arthropathies, not elsewhere classified, other specified site: Secondary | ICD-10-CM | POA: Insufficient documentation

## 2016-01-27 DIAGNOSIS — M150 Primary generalized (osteo)arthritis: Secondary | ICD-10-CM

## 2016-01-27 DIAGNOSIS — Z87891 Personal history of nicotine dependence: Secondary | ICD-10-CM | POA: Insufficient documentation

## 2016-01-27 DIAGNOSIS — M791 Myalgia: Secondary | ICD-10-CM | POA: Insufficient documentation

## 2016-01-27 DIAGNOSIS — M47816 Spondylosis without myelopathy or radiculopathy, lumbar region: Secondary | ICD-10-CM

## 2016-01-27 DIAGNOSIS — M7918 Myalgia, other site: Secondary | ICD-10-CM

## 2016-01-27 DIAGNOSIS — M545 Low back pain: Secondary | ICD-10-CM | POA: Diagnosis present

## 2016-01-27 DIAGNOSIS — M5384 Other specified dorsopathies, thoracic region: Secondary | ICD-10-CM

## 2016-01-27 DIAGNOSIS — G8929 Other chronic pain: Secondary | ICD-10-CM

## 2016-01-27 DIAGNOSIS — M47894 Other spondylosis, thoracic region: Secondary | ICD-10-CM

## 2016-01-27 DIAGNOSIS — M792 Neuralgia and neuritis, unspecified: Secondary | ICD-10-CM

## 2016-01-27 DIAGNOSIS — M546 Pain in thoracic spine: Secondary | ICD-10-CM | POA: Diagnosis not present

## 2016-01-27 DIAGNOSIS — M1991 Primary osteoarthritis, unspecified site: Secondary | ICD-10-CM | POA: Diagnosis not present

## 2016-01-27 DIAGNOSIS — Z79891 Long term (current) use of opiate analgesic: Secondary | ICD-10-CM

## 2016-01-27 DIAGNOSIS — M159 Polyosteoarthritis, unspecified: Secondary | ICD-10-CM

## 2016-01-27 DIAGNOSIS — M47814 Spondylosis without myelopathy or radiculopathy, thoracic region: Secondary | ICD-10-CM

## 2016-01-27 DIAGNOSIS — I10 Essential (primary) hypertension: Secondary | ICD-10-CM | POA: Diagnosis not present

## 2016-01-27 MED ORDER — LACTATED RINGERS IV SOLN: 1000.0000 mL | Freq: Once | INTRAVENOUS | Status: DC

## 2016-01-27 MED ORDER — CYCLOBENZAPRINE HCL 10 MG PO TABS: 10.0000 mg | ORAL_TABLET | Freq: Three times a day (TID) | ORAL | 0 refills | Status: DC | PRN

## 2016-01-27 MED ORDER — MIDAZOLAM HCL 5 MG/5ML IJ SOLN: 1.0000 mg | INTRAMUSCULAR | Status: DC | PRN

## 2016-01-27 MED ORDER — MIDAZOLAM HCL 5 MG/5ML IJ SOLN
INTRAMUSCULAR | Status: AC
Start: 2016-01-27 — End: 2016-01-27
  Administered 2016-01-27: 2 mg via INTRAVENOUS
  Filled 2016-01-27: qty 5

## 2016-01-27 MED ORDER — LIDOCAINE HCL (PF) 1 % IJ SOLN: 10.0000 mL | Freq: Once | INTRAMUSCULAR | Status: DC

## 2016-01-27 MED ORDER — ROPIVACAINE HCL 2 MG/ML IJ SOLN
INTRAMUSCULAR | Status: AC
  Administered 2016-01-27: 11:00:00
  Filled 2016-01-27: qty 10

## 2016-01-27 MED ORDER — ROPIVACAINE HCL 2 MG/ML IJ SOLN: 9.0000 mL | Freq: Once | INTRAMUSCULAR | Status: DC

## 2016-01-27 MED ORDER — FENTANYL CITRATE (PF) 100 MCG/2ML IJ SOLN
INTRAMUSCULAR | Status: AC
  Administered 2016-01-27: 50 ug
  Filled 2016-01-27: qty 2

## 2016-01-27 MED ORDER — FENTANYL CITRATE (PF) 100 MCG/2ML IJ SOLN: 25.0000 ug | INTRAMUSCULAR | Status: DC | PRN

## 2016-01-27 MED ORDER — DEXAMETHASONE SODIUM PHOSPHATE 10 MG/ML IJ SOLN: 10.0000 mg | Freq: Once | INTRAMUSCULAR | Status: DC

## 2016-01-27 MED ORDER — LYRICA 75 MG PO CAPS: 75.0000 mg | ORAL_CAPSULE | Freq: Every day | ORAL | 0 refills | Status: DC

## 2016-01-27 MED ORDER — MELOXICAM 15 MG PO TABS: 15.0000 mg | ORAL_TABLET | Freq: Every day | ORAL | 0 refills | Status: DC

## 2016-01-27 MED ORDER — DEXAMETHASONE SODIUM PHOSPHATE 10 MG/ML IJ SOLN
INTRAMUSCULAR | Status: AC
Start: 2016-01-27 — End: 2016-01-27
  Administered 2016-01-27: 11:00:00
  Filled 2016-01-27: qty 2

## 2016-01-27 NOTE — Patient Instructions (Signed)
Facet Joint Block The facet joints connect the bones of the spine (vertebrae). They make it possible for you to bend, twist, and make other movements with your spine. They also prevent you from overbending, overtwisting, and making other excessive movements.  A facet joint block is a procedure where a numbing medicine (anesthetic) is injected into a facet joint. Often, a type of anti-inflammatory medicine called a steroid is also injected. A facet joint block may be done for two reasons:   Diagnosis. A facet joint block may be done as a test to see whether neck or back pain is caused by a worn-down or infected facet joint. If the pain gets better after a facet joint block, it means the pain is probably coming from the facet joint. If the pain does not get better, it means the pain is probably not coming from the facet joint.   Therapy. A facet joint block may be done to relieve neck or back pain caused by a facet joint. A facet joint block is only done as a therapy if the pain does not improve with medicine, exercise programs, physical therapy, and other forms of pain management. LET YOUR HEALTH CARE PROVIDER KNOW ABOUT:   Any allergies you have.   All medicines you are taking, including vitamins, herbs, eyedrops, and over-the-counter medicines and creams.   Previous problems you or members of your family have had with the use of anesthetics.   Any blood disorders you have had.   Other health problems you have. RISKS AND COMPLICATIONS Generally, having a facet joint block is safe. However, as with any procedure, complications can occur. Possible complications associated with having a facet joint block include:   Bleeding.   Injury to a nerve near the injection site.   Pain at the injection site.   Weakness or numbness in areas controlled by nerves near the injection site.   Infection.   Temporary fluid retention.   Allergic reaction to anesthetics or medicines used during  the procedure. BEFORE THE PROCEDURE   Follow your health care provider's instructions if you are taking dietary supplements or medicines. You may need to stop taking them or reduce your dosage.   Do not take any new dietary supplements or medicines without asking your health care provider first.   Follow your health care provider's instructions about eating and drinking before the procedure. You may need to stop eating and drinking several hours before the procedure.   Arrange to have an adult drive you home after the procedure. PROCEDURE  You may need to remove your clothing and dress in an open-back gown so that your health care provider can access your spine.   The procedure will be done while you are lying on an X-ray table. Most of the time you will be asked to lie on your stomach, but you may be asked to lie in a different position if an injection will be made in your neck.   Special machines will be used to monitor your oxygen levels, heart rate, and blood pressure.   If an injection will be made in your neck, an intravenous (IV) tube will be inserted into one of your veins. Fluids and medicine will flow directly into your body through the IV tube.   The area over the facet joint where the injection will be made will be cleaned with an antiseptic soap. The surrounding skin will be covered with sterile drapes.   An anesthetic will be applied to your skin   to make the injection area numb. You may feel a temporary stinging or burning sensation.   A video X-ray machine will be used to locate the joint. A contrast dye may be injected into the facet joint area to help with locating the joint.   When the joint is located, an anesthetic medicine will be injected into the joint through the needle.   Your health care provider will ask you whether you feel pain relief. If you do feel relief, a steroid may be injected to provide pain relief for a longer period of time. If you do not  feel relief or feel only partial relief, additional injections of an anesthetic may be made in other facet joints.   The needle will be removed, the skin will be cleansed, and bandages will be applied.  AFTER THE PROCEDURE   You will be observed for 15-30 minutes before being allowed to go home. Do not drive. Have an adult drive you or take a taxi or public transportation instead.   If you feel pain relief, the pain will return in several hours or days when the anesthetic wears off.   You may feel pain relief 2-14 days after the procedure. The amount of time this relief lasts varies from person to person.   It is normal to feel some tenderness over the injected area(s) for 2 days following the procedure.   If you have diabetes, you may have a temporary increase in blood sugar.   This information is not intended to replace advice given to you by your health care provider. Make sure you discuss any questions you have with your health care provider.   Document Released: 10/06/2006 Document Revised: 06/07/2014 Document Reviewed: 03/06/2012 Elsevier Interactive Patient Education 2016 [employer] Pain Management Discharge Instructions  General Discharge Instructions :  If you need to reach your doctor call: Monday-Friday 8:00 am - 4:00 pm at [phone] or toll free [phone].  After clinic hours [phone] to have operator reach doctor.  Bring all of your medication bottles to all your appointments in the pain clinic.  To cancel or reschedule your appointment with Pain Management please remember to call 24 hours in advance to avoid a fee.  Refer to the educational materials which you have been given on: General Risks, I had my Procedure. Discharge Instructions, Post Sedation.  Post Procedure Instructions:  The drugs you were given will stay in your system until tomorrow, so for the next 24 hours you should not drive, make any legal decisions or drink any alcoholic  beverages.  You may eat anything you prefer, but it is better to start with liquids then soups and crackers, and gradually work up to solid foods.  Please notify your doctor immediately if you have any unusual bleeding, trouble breathing or pain that is not related to your normal pain.  Depending on the type of procedure that was done, some parts of your body may feel week and/or numb.  This usually clears up by tonight or the next day.  Walk with the use of an assistive device or accompanied by an adult for the 24 hours.  You may use ice on the affected area for the first 24 hours.  Put ice in a Ziploc bag and cover with a towel and place against area 15 minutes on 15 minutes off.  You may switch to heat after 24 hours. 

## 2016-01-27 NOTE — Progress Notes (Addendum)
Safety precautions to be maintained throughout the outpatient stay will include: orient to surroundings, keep bed in low position, maintain call bell within reach at all times, provide assistance with transfer out of bed and ambulation. Bottle labeled tramadol 50 mg  # 65/90  Filled 12-30-15

## 2016-01-28 ENCOUNTER — Telehealth: Payer: Self-pay | Admitting: *Deleted

## 2016-01-28 NOTE — Telephone Encounter (Signed)
Left voice mail

## 2016-02-01 ENCOUNTER — Emergency Department
Admission: EM | Admit: 2016-02-01 | Discharge: 2016-02-01 | Disposition: A | Discharge status: Home / Self Care | Payer: BLUE CROSS/BLUE SHIELD | Attending: Emergency Medicine | Admitting: Emergency Medicine

## 2016-02-01 ENCOUNTER — Encounter: Payer: Self-pay | Admitting: Emergency Medicine

## 2016-02-01 DIAGNOSIS — Z79899 Other long term (current) drug therapy: Secondary | ICD-10-CM | POA: Diagnosis not present

## 2016-02-01 DIAGNOSIS — Z87891 Personal history of nicotine dependence: Secondary | ICD-10-CM | POA: Diagnosis not present

## 2016-02-01 DIAGNOSIS — R55 Syncope and collapse: Secondary | ICD-10-CM | POA: Diagnosis present

## 2016-02-01 DIAGNOSIS — I10 Essential (primary) hypertension: Secondary | ICD-10-CM | POA: Insufficient documentation

## 2016-02-01 LAB — CBC WITH DIFFERENTIAL/PLATELET
BASOS PCT: 0 %
Basophils Absolute: 0 10*3/uL (ref 0–0.1)
EOS ABS: 0.5 10*3/uL (ref 0–0.7)
Eosinophils Relative: 7 %
HCT: 30.7 % — ABNORMAL LOW (ref 35.0–47.0)
HEMOGLOBIN: 11 g/dL — AB (ref 12.0–16.0)
Lymphocytes Relative: 30 %
Lymphs Abs: 2.2 10*3/uL (ref 1.0–3.6)
MCH: 31.8 pg (ref 26.0–34.0)
MCHC: 35.8 g/dL (ref 32.0–36.0)
MCV: 89 fL (ref 80.0–100.0)
Monocytes Absolute: 0.7 10*3/uL (ref 0.2–0.9)
Monocytes Relative: 9 %
NEUTROS PCT: 54 %
Neutro Abs: 3.9 10*3/uL (ref 1.4–6.5)
Platelets: 196 10*3/uL (ref 150–440)
RBC: 3.45 MIL/uL — AB (ref 3.80–5.20)
RDW: 13.7 % (ref 11.5–14.5)
WBC: 7.3 10*3/uL (ref 3.6–11.0)

## 2016-02-01 LAB — BASIC METABOLIC PANEL
ANION GAP: 5 (ref 5–15)
BUN: 13 mg/dL (ref 6–20)
CHLORIDE: 107 mmol/L (ref 101–111)
CO2: 25 mmol/L (ref 22–32)
Calcium: 8.1 mg/dL — ABNORMAL LOW (ref 8.9–10.3)
Creatinine, Ser: 1.12 mg/dL — ABNORMAL HIGH (ref 0.44–1.00)
GFR calc non Af Amer: 52 mL/min — ABNORMAL LOW (ref >60)
Glucose, Bld: 96 mg/dL (ref 65–99)
POTASSIUM: 3.6 mmol/L (ref 3.5–5.1)
SODIUM: 137 mmol/L (ref 135–145)

## 2016-02-01 LAB — TROPONIN I

## 2016-02-01 NOTE — ED Provider Notes (Signed)
[facility] Emergency Department Provider Note   ____________________________________________   I have reviewed the triage vital signs and the nursing notes.   HISTORY  Chief Complaint Loss of Consciousness   History limited by: Not Limited   HPI Deanna Jacobson is a 60 y.o. female who presents to the emergency department today after loss of consciousness. The patient had 3 syncopal episodes. She was with her son at that time. Patient states she had been feeling well this morning. She ate lunch. When she got to her son's house she started feeling lightheaded. Her son states that she then started shaking and slumped over. He does not describe seizure-like activity more slight tremors. The patient did not have any concurrent chest pain or shortness breath. States she has had problems with her blood pressure in the past. Apparently when EMS arrived on the scene her blood pressure was in the systolic of 23N. Patient denies any recent fevers.    Past Medical History:  Diagnosis Date  . Chronic back pain   . Hypertension     Patient Active Problem List   Diagnosis Date Noted  . Musculoskeletal pain 12/30/2015  . 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 Osteoarthritis 12/30/2015  . Chronic pain 11/20/2015  . Long term current use of opiate analgesic 11/20/2015  . Long term prescription opiate use 11/20/2015  . Opiate use 11/20/2015  . Encounter for therapeutic drug level monitoring 11/20/2015  . Encounter for pain management planning 11/20/2015  . Chronic mid back pain 11/20/2015  . 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 Thoracic facet syndrome 11/20/2015  . Lumbar facet arthropathy 11/20/2015  . Acute renal failure (ARF) ([place]) 10/28/2015    Past Surgical History:  Procedure Laterality Date  . BREAST ENHANCEMENT SURGERY      Prior to Admission medications   Medication Sig Start Date End Date Taking? Authorizing Provider  cyclobenzaprine  (FLEXERIL) 10 MG tablet Take 1 tablet (10 mg total) by mouth every 8 (eight) hours as needed for muscle spasms. 01/27/16   Milinda Pointer, MD  lisinopril (PRINIVIL,ZESTRIL) 20 MG tablet Take 1 tablet (20 mg total) by mouth daily. 10/29/15   Fritzi Mandes, MD  LYRICA 75 MG capsule Take 1 capsule (75 mg total) by mouth daily. 01/27/16   Milinda Pointer, MD  meloxicam (MOBIC) 15 MG tablet Take 1 tablet (15 mg total) by mouth daily. 01/27/16   Milinda Pointer, MD  traMADol (ULTRAM) 50 MG tablet Take 1 tablet (50 mg total) by mouth every 8 (eight) hours as needed for severe pain. 12/30/15   Milinda Pointer, MD  traZODone (DESYREL) 100 MG tablet Take 100 mg by mouth at bedtime.  10/27/15   Historical Provider, MD    Allergies Codeine  Family History  Problem Relation Age of Onset  . Cancer Mother   . Hypertension Father     Social History Social History  Substance Use Topics  . Smoking status: Former Research scientist (life sciences)  . Smokeless tobacco: Never Used  . Alcohol use 0.0 oz/week     Comment: daily  drinks 4 beers    Review of Systems  Constitutional: Negative for fever. Cardiovascular: Negative for chest pain. Respiratory: Negative for shortness of breath. Gastrointestinal: Negative for abdominal pain, vomiting and diarrhea. Neurological: Negative for headaches, focal weakness or numbness.   10-point ROS otherwise negative.  ____________________________________________   PHYSICAL EXAM:  VITAL SIGNS: ED Triage Vitals  Enc Vitals Group     BP 02/01/16 1520 136/65     Pulse  Rate 02/01/16 1520 85     Resp 02/01/16 1520 15     Temp 02/01/16 1520 97.5 F (36.4 C)     Temp Source 02/01/16 1520 Oral     SpO2 02/01/16 1520 100 %     Weight 02/01/16 1520 117 lb (53.1 kg)     Height 02/01/16 1520 '5\' 1"'$  (1.549 m)     Head Circumference --      Peak Flow --      Pain Score 02/01/16 1521 0   Constitutional: Alert and oriented. Well appearing and in no distress. Eyes: Conjunctivae are normal.  Normal extraocular movements. ENT   Head: Normocephalic and atraumatic.   Nose: No congestion/rhinnorhea.   Mouth/Throat: Mucous membranes are moist.   Neck: No stridor. Hematological/Lymphatic/Immunilogical: No cervical lymphadenopathy. Cardiovascular: Normal rate, regular rhythm.  No murmurs, rubs, or gallops. Respiratory: Normal respiratory effort without tachypnea nor retractions. Breath sounds are clear and equal bilaterally. No wheezes/rales/rhonchi. Gastrointestinal: Soft and nontender. No distention.  Genitourinary: Deferred Musculoskeletal: Normal range of motion in all extremities. No lower extremity edema. Neurologic:  Normal speech and language. No gross focal neurologic deficits are appreciated.  Skin:  Skin is warm, dry and intact. No rash noted. Psychiatric: Mood and affect are normal. Speech and behavior are normal. Patient exhibits appropriate insight and judgment.  ____________________________________________    LABS (pertinent positives/negatives)  Labs Reviewed  CBC WITH DIFFERENTIAL/PLATELET - Abnormal; Notable for the following:       Result Value   RBC 3.45 (*)    Hemoglobin 11.0 (*)    HCT 30.7 (*)    All other components within normal limits  BASIC METABOLIC PANEL - Abnormal; Notable for the following:    Creatinine, Ser 1.12 (*)    Calcium 8.1 (*)    GFR calc non Af Amer 52 (*)    All other components within normal limits  TROPONIN I  TROPONIN I     ____________________________________________   EKG  I, Nance Pear, attending physician, personally viewed and interpreted this EKG  EKG Time: 1534 Rate: 85 Rhythm: normal sinus rhythm Axis: normal Intervals: qtc 421 QRS: narrow ST changes: no st elevation Impression: normal ekg   ____________________________________________     RADIOLOGY  None  ____________________________________________   PROCEDURES  Procedures  ____________________________________________   INITIAL IMPRESSION / ASSESSMENT AND PLAN / ED COURSE  Pertinent labs & imaging results that were available during my care of the patient were reviewed by me and considered in my medical decision making (see chart for details).  Patient presented to the emergency department today because of concerns for syncopal episodes. No concerning findings on exam. EKG without any concerning findings. Will check blood work  Clinical Course   Blood work without any concerning findings.  2 sets of troponin sent. Blood pressure remained good throughout her stay in the department. Did discuss with patient importance of following up with primary care. ____________________________________________   FINAL CLINICAL IMPRESSION(S) / ED DIAGNOSES  Final diagnoses:  Syncope and collapse     Note: This dictation was prepared with Dragon dictation. Any transcriptional errors that result from this process are unintentional    Nance Pear, MD 02/01/16 2014

## 2016-02-01 NOTE — Discharge Instructions (Signed)
Please seek medical attention for any high fevers, chest pain, shortness of breath, change in behavior, persistent vomiting, bloody stool or any other new or concerning symptoms.  

## 2016-02-01 NOTE — ED Triage Notes (Signed)
Pt presents to ED via EMS from her son's house c/o multiple syncopal episodes. Pt had recent appt with pain clinic where her HTN meds were adjusted. EMS reports intial BP 78/40, pt given 1L NS bolus PTA with BP increasing to 116/68. CBG 104. Pt states she has had this happen before and it was d/t blood pressure medication issues at that time. No c/o dizziness or lightheadedness on arrival.

## 2016-02-01 NOTE — ED Notes (Signed)
Dr. Archie Balboa to address d/c paperwork

## 2016-02-04 LAB — TOXASSURE SELECT 13 (MW), URINE: PDF: 0

## 2016-02-18 ENCOUNTER — Ambulatory Visit: Payer: BLUE CROSS/BLUE SHIELD | Attending: Pain Medicine | Admitting: Pain Medicine

## 2016-02-18 ENCOUNTER — Encounter: Payer: Self-pay | Admitting: Pain Medicine

## 2016-02-18 VITALS — BP 151/74 | HR 88 | Temp 97.7°F | Resp 16 | Ht 61.0 in | Wt 117.0 lb

## 2016-02-18 DIAGNOSIS — M47894 Other spondylosis, thoracic region: Secondary | ICD-10-CM

## 2016-02-18 DIAGNOSIS — M546 Pain in thoracic spine: Secondary | ICD-10-CM | POA: Insufficient documentation

## 2016-02-18 DIAGNOSIS — Z87891 Personal history of nicotine dependence: Secondary | ICD-10-CM | POA: Insufficient documentation

## 2016-02-18 DIAGNOSIS — Z885 Allergy status to narcotic agent status: Secondary | ICD-10-CM | POA: Insufficient documentation

## 2016-02-18 DIAGNOSIS — N179 Acute kidney failure, unspecified: Secondary | ICD-10-CM | POA: Diagnosis not present

## 2016-02-18 DIAGNOSIS — Z5181 Encounter for therapeutic drug level monitoring: Secondary | ICD-10-CM | POA: Diagnosis not present

## 2016-02-18 DIAGNOSIS — M47814 Spondylosis without myelopathy or radiculopathy, thoracic region: Secondary | ICD-10-CM

## 2016-02-18 DIAGNOSIS — M7918 Myalgia, other site: Secondary | ICD-10-CM

## 2016-02-18 DIAGNOSIS — G8929 Other chronic pain: Secondary | ICD-10-CM | POA: Insufficient documentation

## 2016-02-18 DIAGNOSIS — M4696 Unspecified inflammatory spondylopathy, lumbar region: Secondary | ICD-10-CM | POA: Diagnosis not present

## 2016-02-18 DIAGNOSIS — M47816 Spondylosis without myelopathy or radiculopathy, lumbar region: Secondary | ICD-10-CM | POA: Diagnosis not present

## 2016-02-18 DIAGNOSIS — M1991 Primary osteoarthritis, unspecified site: Secondary | ICD-10-CM | POA: Insufficient documentation

## 2016-02-18 DIAGNOSIS — Z79891 Long term (current) use of opiate analgesic: Secondary | ICD-10-CM | POA: Insufficient documentation

## 2016-02-18 DIAGNOSIS — Z809 Family history of malignant neoplasm, unspecified: Secondary | ICD-10-CM | POA: Insufficient documentation

## 2016-02-18 DIAGNOSIS — I10 Essential (primary) hypertension: Secondary | ICD-10-CM | POA: Insufficient documentation

## 2016-02-18 DIAGNOSIS — Z8249 Family history of ischemic heart disease and other diseases of the circulatory system: Secondary | ICD-10-CM | POA: Diagnosis not present

## 2016-02-18 DIAGNOSIS — M791 Myalgia: Secondary | ICD-10-CM | POA: Insufficient documentation

## 2016-02-18 DIAGNOSIS — M5384 Other specified dorsopathies, thoracic region: Secondary | ICD-10-CM

## 2016-02-18 DIAGNOSIS — M159 Polyosteoarthritis, unspecified: Secondary | ICD-10-CM

## 2016-02-18 DIAGNOSIS — M150 Primary generalized (osteo)arthritis: Secondary | ICD-10-CM

## 2016-02-18 DIAGNOSIS — M792 Neuralgia and neuritis, unspecified: Secondary | ICD-10-CM

## 2016-02-18 MED ORDER — CYCLOBENZAPRINE HCL 10 MG PO TABS: 10.0000 mg | ORAL_TABLET | Freq: Three times a day (TID) | ORAL | 5 refills | Status: DC | PRN

## 2016-02-18 MED ORDER — TRAMADOL HCL 50 MG PO TABS: 50.0000 mg | ORAL_TABLET | Freq: Three times a day (TID) | ORAL | 5 refills | Status: DC | PRN

## 2016-02-18 MED ORDER — MELOXICAM 15 MG PO TABS: 15.0000 mg | ORAL_TABLET | Freq: Every day | ORAL | 0 refills | Status: DC

## 2016-02-18 MED ORDER — LYRICA 75 MG PO CAPS: 75.0000 mg | ORAL_CAPSULE | Freq: Two times a day (BID) | ORAL | 0 refills | Status: DC

## 2016-02-18 NOTE — Patient Instructions (Addendum)
GENERAL RISKS AND COMPLICATIONS  What are the risk, side effects and possible complications? Generally speaking, most procedures are safe.  However, with any procedure there are risks, side effects, and the possibility of complications.  The risks and complications are dependent upon the sites that are lesioned, or the type of nerve block to be performed.  The closer the procedure is to the spine, the more serious the risks are.  Great care is taken when placing the radio frequency needles, block needles or lesioning probes, but sometimes complications can occur. 1. Infection: Any time there is an injection through the skin, there is a risk of infection.  This is why sterile conditions are used for these blocks.  There are four possible types of infection. 1. Localized skin infection. 2. Central Nervous System Infection-This can be in the form of Meningitis, which can be deadly. 3. Epidural Infections-This can be in the form of an epidural abscess, which can cause pressure inside of the spine, causing compression of the spinal cord with subsequent paralysis. This would require an emergency surgery to decompress, and there are no guarantees that the patient would recover from the paralysis. 4. Discitis-This is an infection of the intervertebral discs.  It occurs in about 1% of discography procedures.  It is difficult to treat and it may lead to surgery.        2. Pain: the needles have to go through skin and soft tissues, will cause soreness.       3. Damage to internal structures:  The nerves to be lesioned may be near blood vessels or    other nerves which can be potentially damaged.       4. Bleeding: Bleeding is more common if the patient is taking blood thinners such as  aspirin, Coumadin, Ticiid, Plavix, etc., or if he/she have some genetic predisposition  such as hemophilia. Bleeding into the spinal canal can cause compression of the spinal  cord with subsequent paralysis.  This would require an  emergency surgery to  decompress and there are no guarantees that the patient would recover from the  paralysis.       5. Pneumothorax:  Puncturing of a lung is a possibility, every time a needle is introduced in  the area of the chest or upper back.  Pneumothorax refers to free air around the  collapsed lung(s), inside of the thoracic cavity (chest cavity).  Another two possible  complications related to a similar event would include: Hemothorax and Chylothorax.   These are variations of the Pneumothorax, where instead of air around the collapsed  lung(s), you may have blood or chyle, respectively.       6. Spinal headaches: They may occur with any procedures in the area of the spine.       7. Persistent CSF (Cerebro-Spinal Fluid) leakage: This is a rare problem, but may occur  with prolonged intrathecal or epidural catheters either due to the formation of a fistulous  track or a dural tear.       8. Nerve damage: By working so close to the spinal cord, there is always a possibility of  nerve damage, which could be as serious as a permanent spinal cord injury with  paralysis.       9. Death:  Although rare, severe deadly allergic reactions known as "Anaphylactic  reaction" can occur to any of the medications used.      10. Worsening of the symptoms:  We can always make thing worse.    What are the chances of something like this happening? Chances of any of this occuring are extremely low.  By statistics, you have more of a chance of getting killed in a motor vehicle accident: while driving to the hospital than any of the above occurring .  Nevertheless, you should be aware that they are possibilities.  In general, it is similar to taking a shower.  Everybody knows that you can slip, hit your head and get killed.  Does that mean that you should not shower again?  Nevertheless always keep in mind that statistics do not mean anything if you happen to be on the wrong side of them.  Even if a procedure has a 1  (one) in a 1,000,000 (million) chance of going wrong, it you happen to be that one..Also, keep in mind that by statistics, you have more of a chance of having something go wrong when taking medications.  Who should not have this procedure? If you are on a blood thinning medication (e.g. Coumadin, Plavix, see list of "Blood Thinners"), or if you have an active infection going on, you should not have the procedure.  If you are taking any blood thinners, please inform your physician.  How should I prepare for this procedure?  Do not eat or drink anything at least six hours prior to the procedure.  Bring a driver with you .  It cannot be a taxi.  Come accompanied by an adult that can drive you back, and that is strong enough to help you if your legs get weak or numb from the local anesthetic.  Take all of your medicines the morning of the procedure with just enough water to swallow them.  If you have diabetes, make sure that you are scheduled to have your procedure done first thing in the morning, whenever possible.  If you have diabetes, take only half of your insulin dose and notify our nurse that you have done so as soon as you arrive at the clinic.  If you are diabetic, but only take blood sugar pills (oral hypoglycemic), then do not take them on the morning of your procedure.  You may take them after you have had the procedure.  Do not take aspirin or any aspirin-containing medications, at least eleven (11) days prior to the procedure.  They may prolong bleeding.  Wear loose fitting clothing that may be easy to take off and that you would not mind if it got stained with Betadine or blood.  Do not wear any jewelry or perfume  Remove any nail coloring.  It will interfere with some of our monitoring equipment.  NOTE: Remember that this is not meant to be interpreted as a complete list of all possible complications.  Unforeseen problems may occur.  BLOOD THINNERS The following drugs  contain aspirin or other products, which can cause increased bleeding during surgery and should not be taken for 2 weeks prior to and 1 week after surgery.  If you should need take something for relief of minor pain, you may take acetaminophen which is found in Tylenol,m Datril, Anacin-3 and Panadol. It is not blood thinner. The products listed below are.  Do not take any of the products listed below in addition to any listed on your instruction sheet.  A.P.C or A.P.C with Codeine Codeine Phosphate Capsules #3 Ibuprofen Ridaura  ABC compound Congesprin Imuran rimadil  Advil Cope Indocin Robaxisal  Alka-Seltzer Effervescent Pain Reliever and Antacid Coricidin or Coricidin-D  Indomethacin Rufen    Alka-Seltzer plus Cold Medicine Cosprin Ketoprofen S-A-C Tablets  Anacin Analgesic Tablets or Capsules Coumadin Korlgesic Salflex  Anacin Extra Strength Analgesic tablets or capsules CP-2 Tablets Lanoril Salicylate  Anaprox Cuprimine Capsules Levenox Salocol  Anexsia-D Dalteparin Magan Salsalate  Anodynos Darvon compound Magnesium Salicylate Sine-off  Ansaid Dasin Capsules Magsal Sodium Salicylate  Anturane Depen Capsules Marnal Soma  APF Arthritis pain formula Dewitt's Pills Measurin Stanback  Argesic Dia-Gesic Meclofenamic Sulfinpyrazone  Arthritis Bayer Timed Release Aspirin Diclofenac Meclomen Sulindac  Arthritis pain formula Anacin Dicumarol Medipren Supac  Analgesic (Safety coated) Arthralgen Diffunasal Mefanamic Suprofen  Arthritis Strength Bufferin Dihydrocodeine Mepro Compound Suprol  Arthropan liquid Dopirydamole Methcarbomol with Aspirin Synalgos  ASA tablets/Enseals Disalcid Micrainin Tagament  Ascriptin Doan's Midol Talwin  Ascriptin A/D Dolene Mobidin Tanderil  Ascriptin Extra Strength Dolobid Moblgesic Ticlid  Ascriptin with Codeine Doloprin or Doloprin with Codeine Momentum Tolectin  Asperbuf Duoprin Mono-gesic Trendar  Aspergum Duradyne Motrin or Motrin IB Triminicin  Aspirin  plain, buffered or enteric coated Durasal Myochrisine Trigesic  Aspirin Suppositories Easprin Nalfon Trillsate  Aspirin with Codeine Ecotrin Regular or Extra Strength Naprosyn Uracel  Atromid-S Efficin Naproxen Ursinus  Auranofin Capsules Elmiron Neocylate Vanquish  Axotal Emagrin Norgesic Verin  Azathioprine Empirin or Empirin with Codeine Normiflo Vitamin E  Azolid Emprazil Nuprin Voltaren  Bayer Aspirin plain, buffered or children's or timed BC Tablets or powders Encaprin Orgaran Warfarin Sodium  Buff-a-Comp Enoxaparin Orudis Zorpin  Buff-a-Comp with Codeine Equegesic Os-Cal-Gesic   Buffaprin Excedrin plain, buffered or Extra Strength Oxalid   Bufferin Arthritis Strength Feldene Oxphenbutazone   Bufferin plain or Extra Strength Feldene Capsules Oxycodone with Aspirin   Bufferin with Codeine Fenoprofen Fenoprofen Pabalate or Pabalate-SF   Buffets II Flogesic Panagesic   Buffinol plain or Extra Strength Florinal or Florinal with Codeine Panwarfarin   Buf-Tabs Flurbiprofen Penicillamine   Butalbital Compound Four-way cold tablets Penicillin   Butazolidin Fragmin Pepto-Bismol   Carbenicillin Geminisyn Percodan   Carna Arthritis Reliever Geopen Persantine   Carprofen Gold's salt Persistin   Chloramphenicol Goody's Phenylbutazone   Chloromycetin Haltrain Piroxlcam   Clmetidine heparin Plaquenil   Cllnoril Hyco-pap Ponstel   Clofibrate Hydroxy chloroquine Propoxyphen         Before stopping any of these medications, be sure to consult the physician who ordered them.  Some, such as Coumadin (Warfarin) are ordered to prevent or treat serious conditions such as "deep thrombosis", "pumonary embolisms", and other heart problems.  The amount of time that you may need off of the medication may also vary with the medication and the reason for which you were taking it.  If you are taking any of these medications, please make sure you notify your pain physician before you undergo any  procedures.         Facet Blocks Patient Information  Description: The facets are joints in the spine between the vertebrae.  Like any joints in the body, facets can become irritated and painful.  Arthritis can also effect the facets.  By injecting steroids and local anesthetic in and around these joints, we can temporarily block the nerve supply to them.  Steroids act directly on irritated nerves and tissues to reduce selling and inflammation which often leads to decreased pain.  Facet blocks may be done anywhere along the spine from the neck to the low back depending upon the location of your pain.   After numbing the skin with local anesthetic (like Novocaine), a small needle is passed onto the facet joints under x-ray guidance.    You may experience a sensation of pressure while this is being done.  The entire block usually lasts about 15-25 minutes.   Conditions which may be treated by facet blocks:   Low back/buttock pain  Neck/shoulder pain  Certain types of headaches  Preparation for the injection:  1. Do not eat any solid food or dairy products within 8 hours of your appointment. 2. You may drink clear liquid up to 3 hours before appointment.  Clear liquids include water, black coffee, juice or soda.  No milk or cream please. 3. You may take your regular medication, including pain medications, with a sip of water before your appointment.  Diabetics should hold regular insulin (if taken separately) and take 1/2 normal NPH dose the morning of the procedure.  Carry some sugar containing items with you to your appointment. 4. A driver must accompany you and be prepared to drive you home after your procedure. 5. Bring all your current medications with you. 6. An IV may be inserted and sedation may be given at the discretion of the physician. 7. A blood pressure cuff, EKG and other monitors will often be applied during the procedure.  Some patients may need to have extra oxygen  administered for a short period. 8. You will be asked to provide medical information, including your allergies and medications, prior to the procedure.  We must know immediately if you are taking blood thinners (like Coumadin/Warfarin) or if you are allergic to IV iodine contrast (dye).  We must know if you could possible be pregnant.  Possible side-effects:   Bleeding from needle site  Infection (rare, may require surgery)  Nerve injury (rare)  Numbness & tingling (temporary)  Difficulty urinating (rare, temporary)  Spinal headache (a headache worse with upright posture)  Light-headedness (temporary)  Pain at injection site (serveral days)  Decreased blood pressure (rare, temporary)  Weakness in arm/leg (temporary)  Pressure sensation in back/neck (temporary)   Call if you experience:   Fever/chills associated with headache or increased back/neck pain  Headache worsened by an upright position  New onset, weakness or numbness of an extremity below the injection site  Hives or difficulty breathing (go to the emergency room)  Inflammation or drainage at the injection site(s)  Severe back/neck pain greater than usual  New symptoms which are concerning to you  Please note:  Although the local anesthetic injected can often make your back or neck feel good for several hours after the injection, the pain will likely return. It takes 3-7 days for steroids to work.  You may not notice any pain relief for at least one week.  If effective, we will often do a series of 2-3 injections spaced 3-6 weeks apart to maximally decrease your pain.  After the initial series, you may be a candidate for a more permanent nerve block of the facets.  If you have any questions, please call [phone] [facility] 

## 2016-02-18 NOTE — Progress Notes (Signed)
Patient's Name: Deanna Jacobson  MRN: [id number]  Referring Provider: [facility]  DOB: 1955-12-15  PCP: [facility]  DOS: 02/18/2016  Note by: Kathlen Brunswick. Dossie Arbour, MD  Service setting: Ambulatory outpatient  Specialty: Interventional Pain Management  Location: [facility] (AMB) Pain Management Facility    Patient type: Established   Primary Reason(s) for Visit: Encounter for prescription drug management & post-procedure evaluation of chronic illness with mild to moderate exacerbation(Level of risk: moderate) CC: Back Pain (middle lower)  HPI  Deanna Jacobson is a 60 y.o. year old, female patient, who comes today for an initial evaluation. She has Acute renal failure (ARF) ([place]); Chronic pain; Long term current use of opiate analgesic; Long term prescription opiate use; Opiate use; Encounter for therapeutic drug level monitoring; Encounter for pain management planning; Chronic mid back pain; Chronic thoracic spine pain (midline); Thoracic facet syndrome; Lumbar facet arthropathy; Musculoskeletal pain; Neurogenic pain; and Osteoarthritis on her problem list.. Her primarily concern today is the Back Pain (middle lower)  Pain Assessment: Self-Reported Pain Score: 1  (tightness in her back about mid level.)/10             Reported level is compatible with observation.       Pain Type: Chronic pain Pain Location: Back Pain Orientation: Mid, Lower Pain Descriptors / Indicators: Burning Pain Frequency: Intermittent  The patient comes into the clinics today for post-procedure evaluation on the interventional treatment done on 01/27/2016. In addition, she comes in today for pharmacological management of her chronic pain.  The patient  reports that she uses drugs.  Date of Last Visit: 01/27/16 Service Provided on Last Visit: Procedure  Controlled Substance Pharmacotherapy Assessment & REMS (Risk Evaluation and Mitigation Strategy)  Analgesic:Tramadol 50 mg 1 every 6 hours (200  mg/day of tramadol) MME/day:20 mg/day Pill Count: Tramadol  50 mg 29/90 last fill 12/30/2015. Pharmacokinetics: Onset of action (Liberation/Absorption): Within expected pharmacological parameters Time to Peak effect (Distribution): Timing and results are as within normal expected parameters Duration of action (Metabolism/Excretion): Within normal limits for medication Pharmacodynamics: Analgesic Effect: More than 50% Activity Facilitation: Medication(s) allow patient to sit, stand, walk, and do the basic ADLs Perceived Effectiveness: Described as relatively effective, allowing for increase in activities of daily living (ADL) Side-effects or Adverse reactions: None reported Monitoring: [place] PMP: Online review of the past 48-monthperiod conducted. Compliant with practice rules and regulations List of all UDS test(s) done:  Lab Results  Component Value Date   TOXASSSELUR FINAL 01/27/2016   SUMMARY FINAL 11/20/2015   Last UDS on record: ToxAssure Select 13  Date Value Ref Range Status  01/27/2016 FINAL  Final    Comment:    ==================================================================== TOXASSURE SELECT 13 (MW) ==================================================================== Test                             Result       Flag       Units Drug Present and Declared for Prescription Verification   Tramadol                       PRESENT      EXPECTED   O-Desmethyltramadol            PRESENT      EXPECTED   N-Desmethyltramadol            PRESENT      EXPECTED    Source of tramadol is a  prescription medication.    O-desmethyltramadol and N-desmethyltramadol are expected    metabolites of tramadol. ==================================================================== Test                      Result    Flag   Units      Ref Range   Creatinine              25               mg/dL      >=20 ==================================================================== Declared Medications:   The flagging and interpretation on this report are based on the  following declared medications.  Unexpected results may arise from  inaccuracies in the declared medications.  **Note: The testing scope of this panel includes these medications:  Tramadol (Ultram)  **Note: The testing scope of this panel does not include following  reported medications:  Cyclobenzaprine (Flexeril)  Lisinopril (Prinivil)  Meloxicam (Mobic)  Pregabalin (Lyrica)  Trazodone (Desyrel) ==================================================================== For clinical consultation, please call [phone]. ====================================================================    Summary  Date Value Ref Range Status  11/20/2015 FINAL  Final    Comment:    ==================================================================== TOXASSURE COMP DRUG ANALYSIS,UR ==================================================================== Test                             Result       Flag       Units Drug Present and Declared for Prescription Verification   Tramadol                       PRESENT      EXPECTED   O-Desmethyltramadol            PRESENT      EXPECTED   N-Desmethyltramadol            PRESENT      EXPECTED    Source of tramadol is a prescription medication.    O-desmethyltramadol and N-desmethyltramadol are expected    metabolites of tramadol.   Pregabalin                     PRESENT      EXPECTED   Cyclobenzaprine                PRESENT      EXPECTED   Trazodone                      PRESENT      EXPECTED   1,3 chlorophenyl piperazine    PRESENT      EXPECTED    1,3-chlorophenyl piperazine is an expected metabolite of    trazodone. Drug Absent but Declared for Prescription Verification   Diazepam                       Not Detected UNEXPECTED ng/mg creat   Ibuprofen                      Not Detected UNEXPECTED    Ibuprofen, as indicated in the declared medication list, is not    always detected even when  used as directed. ==================================================================== Test                      Result    Flag   Units      Ref Range   Creatinine  42               mg/dL      >=20 ==================================================================== Declared Medications:  The flagging and interpretation on this report are based on the  following declared medications.  Unexpected results may arise from  inaccuracies in the declared medications.  **Note: The testing scope of this panel includes these medications:  Cyclobenzaprine (Flexeril)  Diazepam (Valium)  Pregabalin (Lyrica)  Tramadol (Ultram)  Trazodone (Desyrel)  **Note: The testing scope of this panel does not include small to  moderate amounts of these reported medications:  Ibuprofen  **Note: The testing scope of this panel does not include following  reported medications:  Hydrochlorothiazide (Hydrodiuril)  Hydrochlorothiazide (Lisinopril-HCTZ)  Lisinopril  Lisinopril (Lisinopril-HCTZ)  Meloxicam (Mobic)  Prednisone (Deltasone)  Terbinafine (Lamisil) ==================================================================== For clinical consultation, please call [phone]. ====================================================================    UDS interpretation: Compliant          Medication Assessment Form: Reviewed. Patient indicates being compliant with therapy Treatment compliance: Compliant Risk Assessment: Aberrant Behavior: None observed today Substance Use Disorder (SUD) Risk Level: Low Risk of opioid abuse or dependence: 0.7-3.0% with doses ? 36 MME/day and 6.1-26% with doses ? 120 MME/day. Opioid Risk Tool (ORT) Score: 0 Low Risk for SUD (Score <3) Depression Scale Score: PHQ-2: 0 No depression (0) PHQ-9: 0 No depression (0-4) Risk Mitigation Strategies:  Patient Counseling:  Completed Patient-Prescriber Agreement (PPA): Present and active  Notification to other healthcare  providers: Done   Pharmacologic Plan: No change in therapy, at this time  Post-Procedure Assessment  Procedure done on 01/27/2016: T12, L1, L2, & L3 Medial Branch Level(s) Complications experienced at the time of the procedure: None Side-effects or Adverse reactions: None reported Sedation: Please see nurses note. When no sedatives are used, the analgesic levels obtained are directly associated with the effectiveness of the local anesthetics. On the other hand, when sedation is provided, the level of analgesia obtained during the initial 1 hour immediately following the intervention, is believed to be the result of a combination of factors. These factors may include, but are not limited to: 1. The effectiveness of the local anesthetics used. 2. The effects of the analgesic(s) and/or anxiolytic(s) used. 3. The degree of discomfort experienced by the patient at the time of the procedure. 4. The patients ability and reliability in recalling and recording the events. 5. The presence and influence of possible secondary gains. Results: Relief during the 1st hour after the procedure: 100 % (Ultra-Short Term Relief) Interpretation note: Analgesia during this period is likely to be Local Anesthetic and/or IV Sedative (Analgesic/Anxiolitic) related Local Anesthesia: Long-acting (4-6 hours) anesthetics used. The analgesic levels attained during this period are directly associated to the localized infiltration of local anesthetics and therefore cary significant diagnostic value as to the etiological location or origin of the pain. Results: Relief during the next 4 to 6 hour after the procedure: 80 % (Short Term Relief) Interpretation note: Complete relief confirms area to be the source of pain Long-Term Therapy: Steroids used. Results: Extended relief following procedure: 80 % (patient reports continued stinging and burning in the middle of her back.) Interpretation note: Long-term benefit would suggest an  inflammatory etiology to the pain         Long-Term Benefits:  Current Relief (Now): 75%  Interpretation note: Persistent relief would suggest effective anti-inflammatory effects from steroids Interpretation of Results: Therapy seems to be effective in treating the patient's condition.  Laboratory Chemistry  Inflammation Markers Lab Results  Component  Value Date   ESRSEDRATE 25 12/05/2015   CRP 0.5 12/05/2015   Renal Function Lab Results  Component Value Date   BUN 13 02/01/2016   CREATININE 1.12 (H) 02/01/2016   GFRAA >60 02/01/2016   GFRNONAA 52 (L) 02/01/2016   Hepatic Function Lab Results  Component Value Date   AST 16 10/28/2015   ALT 11 (L) 10/28/2015   ALBUMIN 3.7 10/28/2015   Electrolytes Lab Results  Component Value Date   NA 137 02/01/2016   K 3.6 02/01/2016   CL 107 02/01/2016   CALCIUM 8.1 (L) 02/01/2016   MG 1.8 12/05/2015   Pain Modulating Vitamins Lab Results  Component Value Date   25OHVITD1 59 12/05/2015   25OHVITD2 <1.0 12/05/2015   25OHVITD3 59 12/05/2015   VITAMINB12 354 12/05/2015   Coagulation Parameters Lab Results  Component Value Date   PLT 196 02/01/2016   Cardiovascular Lab Results  Component Value Date   HGB 11.0 (L) 02/01/2016   HCT 30.7 (L) 02/01/2016   Note: Lab results reviewed.  Recent Diagnostic Imaging  No results found.  Meds  The patient has a current medication list which includes the following prescription(s): cyclobenzaprine, lisinopril, lyrica, meloxicam, tramadol, and trazodone.  Current Outpatient Prescriptions on File Prior to Visit  Medication Sig  . lisinopril (PRINIVIL,ZESTRIL) 20 MG tablet Take 1 tablet (20 mg total) by mouth daily.  . traZODone (DESYREL) 100 MG tablet Take 100 mg by mouth at bedtime.    No current facility-administered medications on file prior to visit.     ROS  Constitutional: Denies any fever or chills Gastrointestinal: No reported hemesis, hematochezia, vomiting, or acute GI  distress Musculoskeletal: Denies any acute onset joint swelling, redness, loss of ROM, or weakness Neurological: No reported episodes of acute onset apraxia, aphasia, dysarthria, agnosia, amnesia, paralysis, loss of coordination, or loss of consciousness  Allergies  Ms. Tomey is allergic to codeine.  [place]  Medical:  Ms. Denno  has a past medical history of Chronic back pain and Hypertension. Family: family history includes Cancer in her mother; Hypertension in her father. Surgical:  has a past surgical history that includes Breast enhancement surgery. Tobacco:  reports that she has quit smoking. She has never used smokeless tobacco. Alcohol:  reports that she drinks alcohol. Drug:  reports that she uses drugs.  Constitutional Exam  General appearance: Well nourished, well developed, and well hydrated. In no acute distress Vitals:   02/18/16 1130  BP: (!) 151/74  Pulse: 88  Resp: 16  Temp: 97.7 F (36.5 C)  TempSrc: Oral  SpO2: 100%  Weight: 117 lb (53.1 kg)  Height: '5\' 1"'$  (1.549 m)  BMI Assessment: Estimated body mass index is 22.11 kg/m as calculated from the following:   Height as of this encounter: '5\' 1"'$  (1.549 m).   Weight as of this encounter: 117 lb (53.1 kg).   BMI interpretation:           BMI Readings from Last 4 Encounters:  02/18/16 22.11 kg/m  02/01/16 22.11 kg/m  01/27/16 22.11 kg/m  12/30/15 21.92 kg/m   Wt Readings from Last 4 Encounters:  02/18/16 117 lb (53.1 kg)  02/01/16 117 lb (53.1 kg)  01/27/16 117 lb (53.1 kg)  12/30/15 116 lb (52.6 kg)  Psych/Mental status: Alert and oriented x 3 (person, place, & time) Eyes: PERLA Respiratory: No evidence of acute respiratory distress  Cervical Spine Exam  Inspection: No masses, redness, or swelling Alignment: Symmetrical Functional ROM: ROM appears unrestricted Stability: No  instability detected Muscle strength & Tone: Functionally intact Sensory: Unimpaired Palpation:  Non-contributory  Upper Extremity (UE) Exam    Side: Right upper extremity  Side: Left upper extremity  Inspection: No masses, redness, swelling, or asymmetry  Inspection: No masses, redness, swelling, or asymmetry  Functional ROM: ROM appears unrestricted          Functional ROM: ROM appears unrestricted          Muscle strength & Tone: Functionally intact  Muscle strength & Tone: Functionally intact  Sensory: Unimpaired  Sensory: Unimpaired  Palpation: Non-contributory  Palpation: Non-contributory   Thoracic Spine Exam  Inspection: No masses, redness, or swelling Alignment: Symmetrical Functional ROM: ROM appears unrestricted Stability: No instability detected Sensory: Unimpaired Muscle strength & Tone: Functionally intact Palpation: Non-contributory  Lumbar Spine Exam  Inspection: No masses, redness, or swelling Alignment: Symmetrical Functional ROM: ROM appears unrestricted Stability: No instability detected Muscle strength & Tone: Functionally intact Sensory: Unimpaired Palpation: Non-contributory Provocative Tests: Lumbar Hyperextension and rotation test: evaluation deferred today       Patrick's Maneuver: evaluation deferred today              Gait & Posture Assessment  Ambulation: Unassisted Gait: Relatively normal for age and body habitus Posture: WNL   Lower Extremity Exam    Side: Right lower extremity  Side: Left lower extremity  Inspection: No masses, redness, swelling, or asymmetry  Inspection: No masses, redness, swelling, or asymmetry  Functional ROM: ROM appears unrestricted          Functional ROM: ROM appears unrestricted          Muscle strength & Tone: Functionally intact  Muscle strength & Tone: Functionally intact  Sensory: Unimpaired  Sensory: Unimpaired  Palpation: Non-contributory  Palpation: Non-contributory   Assessment  Primary Diagnosis & Pertinent Problem List: The primary encounter diagnosis was Lumbar facet arthropathy. Diagnoses of  Primary osteoarthritis involving multiple joints, Musculoskeletal pain, Chronic pain, Chronic thoracic spine pain (midline), Thoracic facet syndrome, and Neurogenic pain were also pertinent to this visit.  Visit Diagnosis: 1. Lumbar facet arthropathy   2. Primary osteoarthritis involving multiple joints   3. Musculoskeletal pain   4. Chronic pain   5. Chronic thoracic spine pain (midline)   6. Thoracic facet syndrome   7. Neurogenic pain    Plan of Care   Problem List Items Addressed This Visit      High   Chronic pain (Chronic)   Relevant Medications   meloxicam (MOBIC) 15 MG tablet   cyclobenzaprine (FLEXERIL) 10 MG tablet   traMADol (ULTRAM) 50 MG tablet   LYRICA 75 MG capsule   Chronic thoracic spine pain (midline) (Chronic)   Relevant Medications   meloxicam (MOBIC) 15 MG tablet   cyclobenzaprine (FLEXERIL) 10 MG tablet   traMADol (ULTRAM) 50 MG tablet   Other Relevant Orders   LUMBAR FACET(MEDIAL BRANCH NERVE BLOCK) MBNB   Lumbar facet arthropathy - Primary (Chronic)   Relevant Orders   LUMBAR FACET(MEDIAL BRANCH NERVE BLOCK) MBNB   Musculoskeletal pain (Chronic)   Relevant Medications   cyclobenzaprine (FLEXERIL) 10 MG tablet   Neurogenic pain (Chronic)   Relevant Medications   LYRICA 75 MG capsule   Osteoarthritis (Chronic)   Relevant Medications   meloxicam (MOBIC) 15 MG tablet   cyclobenzaprine (FLEXERIL) 10 MG tablet   traMADol (ULTRAM) 50 MG tablet   Thoracic facet syndrome (Chronic)   Relevant Medications   meloxicam (MOBIC) 15 MG tablet   cyclobenzaprine (FLEXERIL) 10 MG tablet  traMADol (ULTRAM) 50 MG tablet   Other Relevant Orders   LUMBAR FACET(MEDIAL BRANCH NERVE BLOCK) MBNB    Other Visit Diagnoses   None.    Pharmacotherapy (Medications Ordered): Meds ordered this encounter  Medications  . meloxicam (MOBIC) 15 MG tablet    Sig: Take 1 tablet (15 mg total) by mouth daily.    Dispense:  180 tablet    Refill:  0    Do not add this  medication to the electronic "Automatic Refill" notification system. Patient may have prescription filled one day early if pharmacy is closed on scheduled refill date.  . cyclobenzaprine (FLEXERIL) 10 MG tablet    Sig: Take 1 tablet (10 mg total) by mouth every 8 (eight) hours as needed for muscle spasms.    Dispense:  90 tablet    Refill:  5    Do not add this medication to the electronic "Automatic Refill" notification system. Patient may have prescription filled one day early if pharmacy is closed on scheduled refill date.  . traMADol (ULTRAM) 50 MG tablet    Sig: Take 1 tablet (50 mg total) by mouth every 8 (eight) hours as needed for severe pain.    Dispense:  90 tablet    Refill:  5    Do not add this medication to the electronic "Automatic Refill" notification system. Patient may have prescription filled one day early if pharmacy is closed on scheduled refill date. Do not fill until: 02/18/16 To last until: 08/16/16  . LYRICA 75 MG capsule    Sig: Take 1 capsule (75 mg total) by mouth 2 (two) times daily.    Dispense:  60 capsule    Refill:  0    Do not add this medication to the electronic "Automatic Refill" notification system. Patient may have prescription filled one day early if pharmacy is closed on scheduled refill date.   New Prescriptions   No medications on file   Medications administered during this visit: Ms. Esters had no medications administered during this visit. Lab-work, Procedure(s), & Referral(s) Ordered: Orders Placed This Encounter  Procedures  . LUMBAR FACET(MEDIAL BRANCH NERVE BLOCK) MBNB   Imaging & Referral(s) Ordered: None  Interventional Therapies: Scheduled:  T12, L1, L2, & L3 Medial Branch Level(s) #2   Considering: Diagnostic bilateral thoracic T12, L1, L2, & L3 Medial Branch Level(s) #2 facet block under fluoroscopic guidance and IV sedation. Possible bilateral thoracic facet radiofrequency ablation depending on the results of the  diagnostic injection.    PRN Procedures:None at this time.    Requested PM Follow-up: Return in 6 months (on 08/09/2016) for Med-Mgmt, In addition, Schedule Procedure, (ASAA).  Future Appointments Date Time Provider [place]  02/24/2016 10:00 AM Milinda Pointer, MD [facility] None  08/09/2016 11:00 AM Milinda Pointer, MD [facility] None    Primary Care Physician: [facility] Location[facility] Note by: Kathlen Brunswick. Dossie Arbour, M.D, DABA, DABAPM, DABPM, DABIPP, FIPP  Pain Score Disclaimer: We use the NRS-11 scale. This is a self-reported, subjective measurement of pain severity with only modest accuracy. It is used primarily to identify changes within a particular patient. It must be understood that outpatient pain scales are significantly less accurate that those used for research, where they can be applied under ideal controlled circumstances with minimal exposure to variables. In reality, the score is likely to be a combination of pain intensity and pain affect, where pain affect describes the degree of emotional arousal or changes in action readiness  caused by the sensory experience of pain. Factors such as social and work situation, setting, emotional state, anxiety levels, expectation, and prior pain experience may influence pain perception and show large inter-individual differences that may also be affected by time variables.  Patient instructions provided during this appointment: Patient Instructions   GENERAL RISKS AND COMPLICATIONS  What are the risk, side effects and possible complications? Generally speaking, most procedures are safe.  However, with any procedure there are risks, side effects, and the possibility of complications.  The risks and complications are dependent upon the sites that are lesioned, or the type of nerve block to be performed.  The closer the procedure is to the spine, the more serious the risks are.   Great care is taken when placing the radio frequency needles, block needles or lesioning probes, but sometimes complications can occur. 1. Infection: Any time there is an injection through the skin, there is a risk of infection.  This is why sterile conditions are used for these blocks.  There are four possible types of infection. 1. Localized skin infection. 2. Central Nervous System Infection-This can be in the form of Meningitis, which can be deadly. 3. Epidural Infections-This can be in the form of an epidural abscess, which can cause pressure inside of the spine, causing compression of the spinal cord with subsequent paralysis. This would require an emergency surgery to decompress, and there are no guarantees that the patient would recover from the paralysis. 4. Discitis-This is an infection of the intervertebral discs.  It occurs in about 1% of discography procedures.  It is difficult to treat and it may lead to surgery.        2. Pain: the needles have to go through skin and soft tissues, will cause soreness.       3. Damage to internal structures:  The nerves to be lesioned may be near blood vessels or    other nerves which can be potentially damaged.       4. Bleeding: Bleeding is more common if the patient is taking blood thinners such as  aspirin, Coumadin, Ticiid, Plavix, etc., or if he/she have some genetic predisposition  such as hemophilia. Bleeding into the spinal canal can cause compression of the spinal  cord with subsequent paralysis.  This would require an emergency surgery to  decompress and there are no guarantees that the patient would recover from the  paralysis.       5. Pneumothorax:  Puncturing of a lung is a possibility, every time a needle is introduced in  the area of the chest or upper back.  Pneumothorax refers to free air around the  collapsed lung(s), inside of the thoracic cavity (chest cavity).  Another two possible  complications related to a similar event would  include: Hemothorax and Chylothorax.   These are variations of the Pneumothorax, where instead of air around the collapsed  lung(s), you may have blood or chyle, respectively.       6. Spinal headaches: They may occur with any procedures in the area of the spine.       7. Persistent CSF (Cerebro-Spinal Fluid) leakage: This is a rare problem, but may occur  with prolonged intrathecal or epidural catheters either due to the formation of a fistulous  track or a dural tear.       8. Nerve damage: By working so close to the spinal cord, there is always a possibility of  nerve damage, which could be as serious as  a permanent spinal cord injury with  paralysis.       9. Death:  Although rare, severe deadly allergic reactions known as "Anaphylactic  reaction" can occur to any of the medications used.      10. Worsening of the symptoms:  We can always make thing worse.  What are the chances of something like this happening? Chances of any of this occuring are extremely low.  By statistics, you have more of a chance of getting killed in a motor vehicle accident: while driving to the hospital than any of the above occurring .  Nevertheless, you should be aware that they are possibilities.  In general, it is similar to taking a shower.  Everybody knows that you can slip, hit your head and get killed.  Does that mean that you should not shower again?  Nevertheless always keep in mind that statistics do not mean anything if you happen to be on the wrong side of them.  Even if a procedure has a 1 (one) in a 1,000,000 (million) chance of going wrong, it you happen to be that one..Also, keep in mind that by statistics, you have more of a chance of having something go wrong when taking medications.  Who should not have this procedure? If you are on a blood thinning medication (e.g. Coumadin, Plavix, see list of "Blood Thinners"), or if you have an active infection going on, you should not have the procedure.  If you are  taking any blood thinners, please inform your physician.  How should I prepare for this procedure?  Do not eat or drink anything at least six hours prior to the procedure.  Bring a driver with you .  It cannot be a taxi.  Come accompanied by an adult that can drive you back, and that is strong enough to help you if your legs get weak or numb from the local anesthetic.  Take all of your medicines the morning of the procedure with just enough water to swallow them.  If you have diabetes, make sure that you are scheduled to have your procedure done first thing in the morning, whenever possible.  If you have diabetes, take only half of your insulin dose and notify our nurse that you have done so as soon as you arrive at the clinic.  If you are diabetic, but only take blood sugar pills (oral hypoglycemic), then do not take them on the morning of your procedure.  You may take them after you have had the procedure.  Do not take aspirin or any aspirin-containing medications, at least eleven (11) days prior to the procedure.  They may prolong bleeding.  Wear loose fitting clothing that may be easy to take off and that you would not mind if it got stained with Betadine or blood.  Do not wear any jewelry or perfume  Remove any nail coloring.  It will interfere with some of our monitoring equipment.  NOTE: Remember that this is not meant to be interpreted as a complete list of all possible complications.  Unforeseen problems may occur.  BLOOD THINNERS The following drugs contain aspirin or other products, which can cause increased bleeding during surgery and should not be taken for 2 weeks prior to and 1 week after surgery.  If you should need take something for relief of minor pain, you may take acetaminophen which is found in Tylenol,m Datril, Anacin-3 and Panadol. It is not blood thinner. The products listed below are.  Do not  take any of the products listed below in addition to any listed on  your instruction sheet.  A.P.C or A.P.C with Codeine Codeine Phosphate Capsules #3 Ibuprofen Ridaura  ABC compound Congesprin Imuran rimadil  Advil Cope Indocin Robaxisal  Alka-Seltzer Effervescent Pain Reliever and Antacid Coricidin or Coricidin-D  Indomethacin Rufen  Alka-Seltzer plus Cold Medicine Cosprin Ketoprofen S-A-C Tablets  Anacin Analgesic Tablets or Capsules Coumadin Korlgesic Salflex  Anacin Extra Strength Analgesic tablets or capsules CP-2 Tablets Lanoril Salicylate  Anaprox Cuprimine Capsules Levenox Salocol  Anexsia-D Dalteparin Magan Salsalate  Anodynos Darvon compound Magnesium Salicylate Sine-off  Ansaid Dasin Capsules Magsal Sodium Salicylate  Anturane Depen Capsules Marnal Soma  APF Arthritis pain formula Dewitt's Pills Measurin Stanback  Argesic Dia-Gesic Meclofenamic Sulfinpyrazone  Arthritis Bayer Timed Release Aspirin Diclofenac Meclomen Sulindac  Arthritis pain formula Anacin Dicumarol Medipren Supac  Analgesic (Safety coated) Arthralgen Diffunasal Mefanamic Suprofen  Arthritis Strength Bufferin Dihydrocodeine Mepro Compound Suprol  Arthropan liquid Dopirydamole Methcarbomol with Aspirin Synalgos  ASA tablets/Enseals Disalcid Micrainin Tagament  Ascriptin Doan's Midol Talwin  Ascriptin A/D Dolene Mobidin Tanderil  Ascriptin Extra Strength Dolobid Moblgesic Ticlid  Ascriptin with Codeine Doloprin or Doloprin with Codeine Momentum Tolectin  Asperbuf Duoprin Mono-gesic Trendar  Aspergum Duradyne Motrin or Motrin IB Triminicin  Aspirin plain, buffered or enteric coated Durasal Myochrisine Trigesic  Aspirin Suppositories Easprin Nalfon Trillsate  Aspirin with Codeine Ecotrin Regular or Extra Strength Naprosyn Uracel  Atromid-S Efficin Naproxen Ursinus  Auranofin Capsules Elmiron Neocylate Vanquish  Axotal Emagrin Norgesic Verin  Azathioprine Empirin or Empirin with Codeine Normiflo Vitamin E  Azolid Emprazil Nuprin Voltaren  Bayer Aspirin plain, buffered or  children's or timed BC Tablets or powders Encaprin Orgaran Warfarin Sodium  Buff-a-Comp Enoxaparin Orudis Zorpin  Buff-a-Comp with Codeine Equegesic Os-Cal-Gesic   Buffaprin Excedrin plain, buffered or Extra Strength Oxalid   Bufferin Arthritis Strength Feldene Oxphenbutazone   Bufferin plain or Extra Strength Feldene Capsules Oxycodone with Aspirin   Bufferin with Codeine Fenoprofen Fenoprofen Pabalate or Pabalate-SF   Buffets II Flogesic Panagesic   Buffinol plain or Extra Strength Florinal or Florinal with Codeine Panwarfarin   Buf-Tabs Flurbiprofen Penicillamine   Butalbital Compound Four-way cold tablets Penicillin   Butazolidin Fragmin Pepto-Bismol   Carbenicillin Geminisyn Percodan   Carna Arthritis Reliever Geopen Persantine   Carprofen Gold's salt Persistin   Chloramphenicol Goody's Phenylbutazone   Chloromycetin Haltrain Piroxlcam   Clmetidine heparin Plaquenil   Cllnoril Hyco-pap Ponstel   Clofibrate Hydroxy chloroquine Propoxyphen         Before stopping any of these medications, be sure to consult the physician who ordered them.  Some, such as Coumadin (Warfarin) are ordered to prevent or treat serious conditions such as "deep thrombosis", "pumonary embolisms", and other heart problems.  The amount of time that you may need off of the medication may also vary with the medication and the reason for which you were taking it.  If you are taking any of these medications, please make sure you notify your pain physician before you undergo any procedures.         Facet Blocks Patient Information  Description: The facets are joints in the spine between the vertebrae.  Like any joints in the body, facets can become irritated and painful.  Arthritis can also effect the facets.  By injecting steroids and local anesthetic in and around these joints, we can temporarily block the nerve supply to them.  Steroids act directly on irritated nerves and tissues to reduce selling and  inflammation which often leads to decreased pain.  Facet blocks may be done anywhere along the spine from the neck to the low back depending upon the location of your pain.   After numbing the skin with local anesthetic (like Novocaine), a small needle is passed onto the facet joints under x-ray guidance.  You may experience a sensation of pressure while this is being done.  The entire block usually lasts about 15-25 minutes.   Conditions which may be treated by facet blocks:   Low back/buttock pain  Neck/shoulder pain  Certain types of headaches  Preparation for the injection:  1. Do not eat any solid food or dairy products within 8 hours of your appointment. 2. You may drink clear liquid up to 3 hours before appointment.  Clear liquids include water, black coffee, juice or soda.  No milk or cream please. 3. You may take your regular medication, including pain medications, with a sip of water before your appointment.  Diabetics should hold regular insulin (if taken separately) and take 1/2 normal NPH dose the morning of the procedure.  Carry some sugar containing items with you to your appointment. 4. A driver must accompany you and be prepared to drive you home after your procedure. 5. Bring all your current medications with you. 6. An IV may be inserted and sedation may be given at the discretion of the physician. 7. A blood pressure cuff, EKG and other monitors will often be applied during the procedure.  Some patients may need to have extra oxygen administered for a short period. 8. You will be asked to provide medical information, including your allergies and medications, prior to the procedure.  We must know immediately if you are taking blood thinners (like Coumadin/Warfarin) or if you are allergic to IV iodine contrast (dye).  We must know if you could possible be pregnant.  Possible side-effects:   Bleeding from needle site  Infection (rare, may require surgery)  Nerve injury  (rare)  Numbness & tingling (temporary)  Difficulty urinating (rare, temporary)  Spinal headache (a headache worse with upright posture)  Light-headedness (temporary)  Pain at injection site (serveral days)  Decreased blood pressure (rare, temporary)  Weakness in arm/leg (temporary)  Pressure sensation in back/neck (temporary)   Call if you experience:   Fever/chills associated with headache or increased back/neck pain  Headache worsened by an upright position  New onset, weakness or numbness of an extremity below the injection site  Hives or difficulty breathing (go to the emergency room)  Inflammation or drainage at the injection site(s)  Severe back/neck pain greater than usual  New symptoms which are concerning to you  Please note:  Although the local anesthetic injected can often make your back or neck feel good for several hours after the injection, the pain will likely return. It takes 3-7 days for steroids to work.  You may not notice any pain relief for at least one week.  If effective, we will often do a series of 2-3 injections spaced 3-6 weeks apart to maximally decrease your pain.  After the initial series, you may be a candidate for a more permanent nerve block of the facets.  If you have any questions, please call #[phone]) [facility]

## 2016-02-18 NOTE — Progress Notes (Addendum)
Patient here for medication management.  Tramadol  50 mg 29/90 last fill 12/30/2015  Patient reports having an issue with BP and being dehydrated.  She passed out several times in the course of the day and was transported to the ED via EMS for evaluation.   Safety precautions to be maintained throughout the outpatient stay will include: orient to surroundings, keep bed in low position, maintain call bell within reach at all times, provide assistance with transfer out of bed and ambulation.

## 2016-02-24 ENCOUNTER — Ambulatory Visit
Admission: RE | Admit: 2016-02-24 | Discharge: 2016-02-24 | Disposition: A | Discharge status: Home / Self Care | Payer: BLUE CROSS/BLUE SHIELD | Source: Ambulatory Visit | Attending: Pain Medicine | Admitting: Pain Medicine

## 2016-02-24 ENCOUNTER — Ambulatory Visit: Payer: BLUE CROSS/BLUE SHIELD | Attending: Pain Medicine | Admitting: Pain Medicine

## 2016-02-24 ENCOUNTER — Encounter: Payer: Self-pay | Admitting: Pain Medicine

## 2016-02-24 VITALS — BP 146/81 | HR 72 | Temp 96.7°F | Resp 16 | Ht 61.0 in | Wt 117.0 lb

## 2016-02-24 DIAGNOSIS — M47814 Spondylosis without myelopathy or radiculopathy, thoracic region: Secondary | ICD-10-CM

## 2016-02-24 DIAGNOSIS — G8929 Other chronic pain: Secondary | ICD-10-CM

## 2016-02-24 DIAGNOSIS — N179 Acute kidney failure, unspecified: Secondary | ICD-10-CM | POA: Diagnosis not present

## 2016-02-24 DIAGNOSIS — M546 Pain in thoracic spine: Secondary | ICD-10-CM | POA: Insufficient documentation

## 2016-02-24 DIAGNOSIS — M5384 Other specified dorsopathies, thoracic region: Secondary | ICD-10-CM | POA: Insufficient documentation

## 2016-02-24 DIAGNOSIS — M545 Low back pain: Secondary | ICD-10-CM | POA: Diagnosis not present

## 2016-02-24 DIAGNOSIS — M47816 Spondylosis without myelopathy or radiculopathy, lumbar region: Secondary | ICD-10-CM | POA: Insufficient documentation

## 2016-02-24 DIAGNOSIS — M47894 Other spondylosis, thoracic region: Secondary | ICD-10-CM

## 2016-02-24 DIAGNOSIS — Z79891 Long term (current) use of opiate analgesic: Secondary | ICD-10-CM | POA: Insufficient documentation

## 2016-02-24 MED ORDER — LIDOCAINE HCL (PF) 1 % IJ SOLN
10.0000 mL | Freq: Once | INTRAMUSCULAR | Status: AC
  Administered 2016-02-24: 10 mL

## 2016-02-24 MED ORDER — ROPIVACAINE HCL 2 MG/ML IJ SOLN
9.0000 mL | Freq: Once | INTRAMUSCULAR | Status: DC
Start: 2016-02-24 — End: 2016-04-02

## 2016-02-24 MED ORDER — LIDOCAINE HCL (PF) 1 % IJ SOLN
INTRAMUSCULAR | Status: AC
  Filled 2016-02-24: qty 5

## 2016-02-24 MED ORDER — FENTANYL CITRATE (PF) 100 MCG/2ML IJ SOLN: 25.0000 ug | INTRAMUSCULAR | Status: DC | PRN

## 2016-02-24 MED ORDER — MIDAZOLAM HCL 5 MG/5ML IJ SOLN
INTRAMUSCULAR | Status: AC
  Administered 2016-02-24: 3 mg
  Filled 2016-02-24: qty 5

## 2016-02-24 MED ORDER — FENTANYL CITRATE (PF) 100 MCG/2ML IJ SOLN
INTRAMUSCULAR | Status: AC
Start: 2016-02-24 — End: 2016-02-24
  Administered 2016-02-24: 50 ug
  Filled 2016-02-24: qty 2

## 2016-02-24 MED ORDER — TRIAMCINOLONE ACETONIDE 40 MG/ML IJ SUSP: 40.0000 mg | Freq: Once | INTRAMUSCULAR | Status: DC

## 2016-02-24 MED ORDER — ROPIVACAINE HCL 2 MG/ML IJ SOLN
INTRAMUSCULAR | Status: AC
  Administered 2016-02-24: 11:00:00
  Filled 2016-02-24: qty 20

## 2016-02-24 MED ORDER — LACTATED RINGERS IV SOLN
1000.0000 mL | Freq: Once | INTRAVENOUS | Status: AC
  Administered 2016-02-24: 1000 mL via INTRAVENOUS

## 2016-02-24 MED ORDER — TRIAMCINOLONE ACETONIDE 40 MG/ML IJ SUSP
INTRAMUSCULAR | Status: AC
  Administered 2016-02-24: 11:00:00
  Filled 2016-02-24: qty 2

## 2016-02-24 MED ORDER — MIDAZOLAM HCL 5 MG/5ML IJ SOLN: 1.0000 mg | INTRAMUSCULAR | Status: DC | PRN

## 2016-02-24 NOTE — Progress Notes (Signed)
Patient's Name: Deanna Jacobson  MRN: [id number]  Referring Provider: [facility]  DOB: September 05, 1955  PCP: [facility]  DOS: 02/24/2016  Note by: Kathlen Brunswick. Dossie Arbour, MD  Service setting: Ambulatory outpatient  Location: [facility] (AMB) Pain Management Facility  Visit type: Procedure  Specialty: Interventional Pain Management  Patient type: Established   Primary Reason for Visit: Interventional Pain Management Treatment. CC: Back Pain (lower, center)  Procedure:  Anesthesia, Analgesia, Anxiolysis:  Type: Diagnostic Medial Branch Facet Block Region: Lumbar Level: T12, L1, L2, & L3Medial Branch Level(s) Laterality: Bilateral    Type: Moderate (Conscious) Sedation & Local Anesthesia Local Anesthetic: Lidocaine 1% Route: Intravenous (IV) IV Access: Secured Sedation: Meaningful verbal contact was maintained at all times during the procedure  Indication(s): Analgesia & Anxiolysis   Indications: 1. Lumbar facet arthropathy   2. Chronic thoracic spine pain (midline)   3. Thoracic facet syndrome     Pain Score: Pre-procedure: 1 /10 Post-procedure: 0-No pain/10  Pre-Procedure Assessment:  [name] is a 60 y.o. year old, female patient, seen today for interventional treatment. She has Acute renal failure (ARF) ([place]); Chronic pain; Long term current use of opiate analgesic; Long term prescription opiate use; Opiate use; Encounter for therapeutic drug level monitoring; Encounter for pain management planning; Chronic mid back pain; Chronic thoracic spine pain (midline); Thoracic facet syndrome; Lumbar facet arthropathy; Musculoskeletal pain; Neurogenic pain; and Osteoarthritis on her problem list.. Her primarily concern today is the Back Pain (lower, center)   Pain Type: Chronic pain Pain Location: Back Pain Orientation: Lower Pain Descriptors / Indicators: Burning (stinging) Pain Frequency: Intermittent  Date of Last Visit: 02/18/16 Service Provided on Last  Visit: Evaluation  Coagulation Parameters Lab Results  Component Value Date   PLT 196 02/01/2016    Verification of the correct person, correct site (including marking of site), and correct procedure were performed and confirmed by the patient.  Consent: Secured. Under the influence of no sedatives a written informed consent was obtained, after having provided information on the risks and possible complications. To fulfill our ethical and legal obligations, as recommended by the American Medical Association's Code of Ethics, we have provided information to the patient about our clinical impression; the nature and purpose of the treatment or procedure; the risks, benefits, and possible complications of the intervention; alternatives; the risk(s) and benefit(s) of the alternative treatment(s) or procedure(s); and the risk(s) and benefit(s) of doing nothing. The patient was provided information about the risks and possible complications associated with the procedure. These include, but are not limited to, failure to achieve desired goals, infection, bleeding, organ or nerve damage, allergic reactions, paralysis, and death. In the case of spinal procedures these may include, but are not limited to, failure to achieve desired goals, infection, bleeding, organ or nerve damage, allergic reactions, paralysis, and death. In addition, the patient was informed that Medicine is not an exact science; therefore, there is also the possibility of unforeseen risks and possible complications that may result in a catastrophic outcome. The patient indicated having understood very clearly. We have given the patient no guarantees and we have made no promises. Enough time was given to the patient to ask questions, all of which were answered to the patient's satisfaction.  Consent Attestation: I, the ordering provider, attest that I have discussed with the patient the benefits, risks, side-effects, alternatives, likelihood of  achieving goals, and potential problems during recovery for the procedure that I have provided informed consent.  Pre-Procedure Preparation: Safety Precautions:  Allergies reviewed. Appropriate site, procedure, and patient were confirmed by following the Joint Commission's Universal Protocol (UP.01.01.01), in the form of a "Time Out". The patient was asked to confirm marked site and procedure, before commencing. The patient was asked about blood thinners, or active infections, both of which were denied. Patient was assessed for positional comfort and all pressure points were checked before starting procedure. Infection Control Precautions: Sterile technique used. Standard Universal Precautions were taken as recommended by the Department of [facility] for Disease Control and Prevention (CDC). Standard pre-surgical skin prep was conducted. Respiratory hygiene and cough etiquette was practiced. Hand hygiene observed. Safe injection practices and needle disposal techniques followed. SDV (single dose vial) medications used. Medications properly checked for expiration dates and contaminants. Personal protective equipment (PPE) used: Surgical mask. Sterile Radiation-resistant gloves. Monitoring:  As per clinic protocol. Vitals:   02/24/16 1055 02/24/16 1105 02/24/16 1115 02/24/16 1125  BP: (!) 147/76 (!) 144/67 (!) 158/75 (!) 146/81  Pulse: 77 77 73 72  Resp: _0 Temp:  (!) 96.7 F (35.9 C)    TempSrc:      SpO2: 97% 99% 100% 100%  Weight:      Height:      Calculated BMI: Body mass index is 22.11 kg/m. Allergies: She is allergic to codeine.. Allergy Precautions: None required  Description of Procedure Process:   Time-out: "Time-out" completed before starting procedure, as per protocol. Position: Prone Target Area: For Lumbar Facet blocks, the target is the groove formed by the junction of the transverse process and superior articular process. T12, L1, L2, & L3Medial  Branch. Approach: Paramedial approach. Area Prepped: Entire Posterior Lumbosacral Region Prepping solution: ChloraPrep (2% chlorhexidine gluconate and 70% isopropyl alcohol) Safety Precautions: Aspiration looking for blood return was conducted prior to all injections. At no point did we inject any substances, as a needle was being advanced. No attempts were made at seeking any paresthesias. Safe injection practices and needle disposal techniques used. Medications properly checked for expiration dates. SDV (single dose vial) medications used. Description of the Procedure: Protocol guidelines were followed. The patient was placed in position over the fluoroscopy table. The target area was identified and the area prepped in the usual manner. Skin desensitized using vapocoolant spray. Skin & deeper tissues infiltrated with local anesthetic. Appropriate amount of time allowed to pass for local anesthetics to take effect. The procedure needle was introduced through the skin, ipsilateral to the reported pain, and advanced to the target area. Employing the "Medial Branch Technique", the needles were advanced to the angle made by the superior and medial portion of the transverse process, and the lateral and inferior portion of the superior articulating process of the targeted vertebral bodies. This area is known as "Burton's Eye" or the "Eye of the [place] Dog". This last needle was later repositioned at the superior and lateral border of the posterior S1 foramen. Negative aspiration confirmed. Solution injected in intermittent fashion, asking for systemic symptoms every 0.5cc of injectate. The needles were then removed and the area cleansed, making sure to leave some of the prepping solution back to take advantage of its long term bactericidal properties. EBL: None Materials & Medications Used:  Needle(s) Used: 22g - 3.5" Spinal Needle(s)  Imaging Guidance:   Type of Imaging Technique: Fluoroscopy Guidance  (Spinal) Indication(s): Assistance in needle guidance and placement for procedures requiring needle placement in or near specific anatomical locations not easily accessible without such assistance. Exposure Time: Please see nurses  notes. Contrast: None required. Fluoroscopic Guidance: I was personally present in the fluoroscopy suite, where the patient was placed in position for the procedure, over the fluoroscopy-compatible table. Fluoroscopy was manipulated, using "Tunnel Vision Technique", to obtain the best possible view of the target area, on the affected side. Parallax error was corrected before commencing the procedure. A "direction-depth-direction" technique was used to introduce the needle under continuous pulsed fluoroscopic guidance. Once the target was reached, antero-posterior, oblique, and lateral fluoroscopic projection views were taken to confirm needle placement in all planes. Permanently recorded images stored by scanning into EMR. Interpretation: Intraoperative imaging interpretation by performing Physician. Adequate needle placement confirmed. Adequate needle placement confirmed in AP, lateral, & Oblique Views. No contrast injected.  Antibiotic Prophylaxis:  Indication(s): No indications identified. Type:  Antibiotics Given (last 72 hours)    None       Post-operative Assessment:   Complications: No immediate post-treatment complications were observed. Disposition: Return to clinic for follow-up evaluation. The patient tolerated the entire procedure well. A repeat set of vitals were taken after the procedure and the patient was kept under observation following institutional policy, for this procedure. Post-procedural neurological assessment was performed, showing return to baseline, prior to discharge. The patient was discharged home, once institutional criteria were met. The patient was provided with post-procedure discharge instructions, including a section on how to identify  potential problems. Should any problems arise concerning this procedure, the patient was given instructions to immediately contact us, at any time, without hesitation. In any case, we plan to contact the patient by telephone for a follow-up status report regarding this interventional procedure. Comments:  No additional relevant information.  Plan of Care   Problem List Items Addressed This Visit      High   Chronic thoracic spine pain (midline) (Chronic)   Relevant Medications   fentaNYL (SUBLIMAZE) 100 MCG/2ML injection (Completed)   triamcinolone acetonide (KENALOG-40) 40 MG/ML injection (Completed)   fentaNYL (SUBLIMAZE) injection 25-50 mcg   triamcinolone acetonide (KENALOG-40) injection 40 mg   Lumbar facet arthropathy - Primary (Chronic)   Relevant Medications   fentaNYL (SUBLIMAZE) injection 25-50 mcg   lactated ringers infusion 1,000 mL (Completed)   midazolam (VERSED) 5 MG/5ML injection 1-2 mg   triamcinolone acetonide (KENALOG-40) injection 40 mg   lidocaine (PF) (XYLOCAINE) 1 % injection 10 mL (Completed)   ropivacaine (PF) 2 mg/ml (0.2%) (NAROPIN) epidural 9 mL   Other Relevant Orders   DG C-Arm 1-60 Min-No Report (Completed)   Thoracic facet syndrome (Chronic)   Relevant Medications   fentaNYL (SUBLIMAZE) 100 MCG/2ML injection (Completed)   triamcinolone acetonide (KENALOG-40) 40 MG/ML injection (Completed)   fentaNYL (SUBLIMAZE) injection 25-50 mcg   lactated ringers infusion 1,000 mL (Completed)   midazolam (VERSED) 5 MG/5ML injection 1-2 mg   triamcinolone acetonide (KENALOG-40) injection 40 mg   lidocaine (PF) (XYLOCAINE) 1 % injection 10 mL (Completed)   ropivacaine (PF) 2 mg/ml (0.2%) (NAROPIN) epidural 9 mL   Other Relevant Orders   DG C-Arm 1-60 Min-No Report (Completed)    Other Visit Diagnoses   None.     Requested PM Follow-up: Return in about 2 weeks (around 03/09/2016) for Post-Procedure evaluation.  Future Appointments Date Time Provider  [place]  04/01/2016 10:00 AM Milinda Pointer, MD [facility] None  08/09/2016 11:00 AM Milinda Pointer, MD [facility] None    Primary Care Physician: [facility] Location: [facility] Note by: Kathlen Brunswick. Dossie Arbour, M.D, DABA, DABAPM, DABPM, DABIPP, FIPP  Illustration of the posterior view of the lumbar spine and the posterior neural structures. Laminae of L2 through S1 are labeled. DPRL5, dorsal primary ramus of L5; DPRS1, dorsal primary ramus of S1; DPR3, dorsal primary ramus of L3; FJ, facet (zygapophyseal) joint L3-L4; I, inferior articular process of L4; LB1, lateral branch of dorsal primary ramus of L1; IAB, inferior articular branches from L3 medial branch (supplies L4-L5 facet joint); IBP, intermediate branch plexus; MB3, medial branch of dorsal primary ramus of L3; NR3, third lumbar nerve root; S, superior articular process of L5; SAB, superior articular branches from L4 (supplies L4-5 facet joint also); TP3, transverse process of L3.  Disclaimer:  Medicine is not an Chief Strategy Officer. The only guarantee in medicine is that nothing is guaranteed. It is important to note that the decision to proceed with this intervention was based on the information collected from the patient. The Data and conclusions were drawn from the patient's questionnaire, the interview, and the physical examination. Because the information was provided in large part by the patient, it cannot be guaranteed that it has not been purposely or unconsciously manipulated. Every effort has been made to obtain as much relevant data as possible for this evaluation. It is important to note that the conclusions that lead to this procedure are derived in large part from the available data. Always take into account that the treatment will also be dependent on availability of resources and existing treatment guidelines, considered by other Pain Management Practitioners as being common  knowledge and practice, at the time of the intervention. For Medico-Legal purposes, it is also important to point out that variation in procedural techniques and pharmacological choices are the acceptable norm. The indications, contraindications, technique, and results of the above procedure should only be interpreted and judged by a Board-Certified Interventional Pain Specialist with extensive familiarity and expertise in the same exact procedure and technique. Attempts at providing opinions without similar or greater experience and expertise than that of the treating physician will be considered as inappropriate and unethical, and shall result in a formal complaint to the state medical board and applicable specialty societies.

## 2016-02-24 NOTE — Progress Notes (Signed)
Safety precautions to be maintained throughout the outpatient stay will include: orient to surroundings, keep bed in low position, maintain call bell within reach at all times, provide assistance with transfer out of bed and ambulation.  

## 2016-02-24 NOTE — Patient Instructions (Signed)
Pain Management Discharge Instructions  General Discharge Instructions :  If you need to reach your doctor call: Monday-Friday 8:00 am - 4:00 pm at [phone] or toll free [phone].  After clinic hours [phone] to have operator reach doctor.  Bring all of your medication bottles to all your appointments in the pain clinic.  To cancel or reschedule your appointment with Pain Management please remember to call 24 hours in advance to avoid a fee.  Refer to the educational materials which you have been given on: General Risks, I had my Procedure. Discharge Instructions, Post Sedation.  Post Procedure Instructions:  The drugs you were given will stay in your system until tomorrow, so for the next 24 hours you should not drive, make any legal decisions or drink any alcoholic beverages.  You may eat anything you prefer, but it is better to start with liquids then soups and crackers, and gradually work up to solid foods.  Please notify your doctor immediately if you have any unusual bleeding, trouble breathing or pain that is not related to your normal pain.  Depending on the type of procedure that was done, some parts of your body may feel week and/or numb.  This usually clears up by tonight or the next day.  Walk with the use of an assistive device or accompanied by an adult for the 24 hours.  You may use ice on the affected area for the first 24 hours.  Put ice in a Ziploc bag and cover with a towel and place against area 15 minutes on 15 minutes off.  You may switch to heat after 24 hours.Facet Joint Block, Care After Refer to this sheet in the next few weeks. These instructions provide you with information on caring for yourself after your procedure. Your health care provider may also give you more specific instructions. Your treatment has been planned according to current medical practices, but problems sometimes occur. Call your health care provider if you have any problems or  questions after your procedure. HOME CARE INSTRUCTIONS   Keep track of the amount of pain relief you feel and how long it lasts.  Limit pain medicine within the first 4-6 hours after the procedure as directed by your health care provider.  Resume taking dietary supplements and medicines as directed by your health care provider.  You may resume your regular diet.  Do not apply heat near or over the injection site(s) for 24 hours.   Do not take a bath or soak in water (such as a pool or lake) for 24 hours.  Do not drive for 24 hours unless approved by your health care provider.  Avoid strenuous activity for 24 hours.  Remove your bandages the morning after the procedure.   If the injection site is tender, applying an ice pack may relieve some tenderness. To do this:  Put ice in a bag.  Place a towel between your skin and the bag.  Leave the ice on for 15-20 minutes, 3-4 times a day.  Keep follow-up appointments as directed by your health care provider. SEEK MEDICAL CARE IF:   Your pain is not controlled by your medicines.   There is drainage from the injection site.   There is significant bleeding or swelling at the injection site.  You have diabetes and your blood sugar is above 180 mg/dL. SEEK IMMEDIATE MEDICAL CARE IF:   You develop a fever of 101F (38.3C) or greater.   You have worsening pain or swelling around   the injection site.   You have red streaking around the injection site.   You develop severe pain that is not controlled by your medicines.   You develop a headache, stiff neck, nausea, or vomiting.   Your eyes become very sensitive to light.   You have weakness, paralysis, or tingling in your arms or legs that was not present before the procedure.   You develop difficulty urinating or breathing.    This information is not intended to replace advice given to you by your health care provider. Make sure you discuss any questions you have  with your health care provider.   Document Released: 05/03/2012 Document Revised: 06/07/2014 Document Reviewed: 05/03/2012 Elsevier Interactive Patient Education 2016 [employer] Facet Joint Block The facet joints connect the bones of the spine (vertebrae). They make it possible for you to bend, twist, and make other movements with your spine. They also prevent you from overbending, overtwisting, and making other excessive movements.  A facet joint block is a procedure where a numbing medicine (anesthetic) is injected into a facet joint. Often, a type of anti-inflammatory medicine called a steroid is also injected. A facet joint block may be done for two reasons:   Diagnosis. A facet joint block may be done as a test to see whether neck or back pain is caused by a worn-down or infected facet joint. If the pain gets better after a facet joint block, it means the pain is probably coming from the facet joint. If the pain does not get better, it means the pain is probably not coming from the facet joint.   Therapy. A facet joint block may be done to relieve neck or back pain caused by a facet joint. A facet joint block is only done as a therapy if the pain does not improve with medicine, exercise programs, physical therapy, and other forms of pain management. LET YOUR HEALTH CARE PROVIDER KNOW ABOUT:   Any allergies you have.   All medicines you are taking, including vitamins, herbs, eyedrops, and over-the-counter medicines and creams.   Previous problems you or members of your family have had with the use of anesthetics.   Any blood disorders you have had.   Other health problems you have. RISKS AND COMPLICATIONS Generally, having a facet joint block is safe. However, as with any procedure, complications can occur. Possible complications associated with having a facet joint block include:   Bleeding.   Injury to a nerve near the injection site.   Pain at the injection site.    Weakness or numbness in areas controlled by nerves near the injection site.   Infection.   Temporary fluid retention.   Allergic reaction to anesthetics or medicines used during the procedure. BEFORE THE PROCEDURE   Follow your health care provider's instructions if you are taking dietary supplements or medicines. You may need to stop taking them or reduce your dosage.   Do not take any new dietary supplements or medicines without asking your health care provider first.   Follow your health care provider's instructions about eating and drinking before the procedure. You may need to stop eating and drinking several hours before the procedure.   Arrange to have an adult drive you home after the procedure. PROCEDURE  You may need to remove your clothing and dress in an open-back gown so that your health care provider can access your spine.   The procedure will be done while you are lying on an X-ray table. Most   of the time you will be asked to lie on your stomach, but you may be asked to lie in a different position if an injection will be made in your neck.   Special machines will be used to monitor your oxygen levels, heart rate, and blood pressure.   If an injection will be made in your neck, an intravenous (IV) tube will be inserted into one of your veins. Fluids and medicine will flow directly into your body through the IV tube.   The area over the facet joint where the injection will be made will be cleaned with an antiseptic soap. The surrounding skin will be covered with sterile drapes.   An anesthetic will be applied to your skin to make the injection area numb. You may feel a temporary stinging or burning sensation.   A video X-ray machine will be used to locate the joint. A contrast dye may be injected into the facet joint area to help with locating the joint.   When the joint is located, an anesthetic medicine will be injected into the joint through the  needle.   Your health care provider will ask you whether you feel pain relief. If you do feel relief, a steroid may be injected to provide pain relief for a longer period of time. If you do not feel relief or feel only partial relief, additional injections of an anesthetic may be made in other facet joints.   The needle will be removed, the skin will be cleansed, and bandages will be applied.  AFTER THE PROCEDURE   You will be observed for 15-30 minutes before being allowed to go home. Do not drive. Have an adult drive you or take a taxi or public transportation instead.   If you feel pain relief, the pain will return in several hours or days when the anesthetic wears off.   You may feel pain relief 2-14 days after the procedure. The amount of time this relief lasts varies from person to person.   It is normal to feel some tenderness over the injected area(s) for 2 days following the procedure.   If you have diabetes, you may have a temporary increase in blood sugar.   This information is not intended to replace advice given to you by your health care provider. Make sure you discuss any questions you have with your health care provider.   Document Released: 10/06/2006 Document Revised: 06/07/2014 Document Reviewed: 03/06/2012 Elsevier Interactive Patient Education 2016 [place]  What are the risk, side effects and possible complications? Generally speaking, most procedures are safe.  However, with any procedure there are risks, side effects, and the possibility of complications.  The risks and complications are dependent upon the sites that are lesioned, or the type of nerve block to be performed.  The closer the procedure is to the spine, the more serious the risks are.  Great care is taken when placing the radio frequency needles, block needles or lesioning probes, but sometimes complications can occur. 1. Infection: Any time there is an  injection through the skin, there is a risk of infection.  This is why sterile conditions are used for these blocks.  There are four possible types of infection. 1. Localized skin infection. 2. Central Nervous System Infection-This can be in the form of Meningitis, which can be deadly. 3. Epidural Infections-This can be in the form of an epidural abscess, which can cause pressure inside of the spine, causing compression of  the spinal cord with subsequent paralysis. This would require an emergency surgery to decompress, and there are no guarantees that the patient would recover from the paralysis. 4. Discitis-This is an infection of the intervertebral discs.  It occurs in about 1% of discography procedures.  It is difficult to treat and it may lead to surgery.        2. Pain: the needles have to go through skin and soft tissues, will cause soreness.       3. Damage to internal structures:  The nerves to be lesioned may be near blood vessels or    other nerves which can be potentially damaged.       4. Bleeding: Bleeding is more common if the patient is taking blood thinners such as  aspirin, Coumadin, Ticiid, Plavix, etc., or if he/she have some genetic predisposition  such as hemophilia. Bleeding into the spinal canal can cause compression of the spinal  cord with subsequent paralysis.  This would require an emergency surgery to  decompress and there are no guarantees that the patient would recover from the  paralysis.       5. Pneumothorax:  Puncturing of a lung is a possibility, every time a needle is introduced in  the area of the chest or upper back.  Pneumothorax refers to free air around the  collapsed lung(s), inside of the thoracic cavity (chest cavity).  Another two possible  complications related to a similar event would include: Hemothorax and Chylothorax.   These are variations of the Pneumothorax, where instead of air around the collapsed  lung(s), you may have blood or chyle, respectively.        6. Spinal headaches: They may occur with any procedures in the area of the spine.       7. Persistent CSF (Cerebro-Spinal Fluid) leakage: This is a rare problem, but may occur  with prolonged intrathecal or epidural catheters either due to the formation of a fistulous  track or a dural tear.       8. Nerve damage: By working so close to the spinal cord, there is always a possibility of  nerve damage, which could be as serious as a permanent spinal cord injury with  paralysis.       9. Death:  Although rare, severe deadly allergic reactions known as "Anaphylactic  reaction" can occur to any of the medications used.      10. Worsening of the symptoms:  We can always make thing worse.  What are the chances of something like this happening? Chances of any of this occuring are extremely low.  By statistics, you have more of a chance of getting killed in a motor vehicle accident: while driving to the hospital than any of the above occurring .  Nevertheless, you should be aware that they are possibilities.  In general, it is similar to taking a shower.  Everybody knows that you can slip, hit your head and get killed.  Does that mean that you should not shower again?  Nevertheless always keep in mind that statistics do not mean anything if you happen to be on the wrong side of them.  Even if a procedure has a 1 (one) in a 1,000,000 (million) chance of going wrong, it you happen to be that one..Also, keep in mind that by statistics, you have more of a chance of having something go wrong when taking medications.  Who should not have this procedure? If you are on a blood thinning  medication (e.g. Coumadin, Plavix, see list of "Blood Thinners"), or if you have an active infection going on, you should not have the procedure.  If you are taking any blood thinners, please inform your physician.  How should I prepare for this procedure?  Do not eat or drink anything at least six hours prior to the  procedure.  Bring a driver with you .  It cannot be a taxi.  Come accompanied by an adult that can drive you back, and that is strong enough to help you if your legs get weak or numb from the local anesthetic.  Take all of your medicines the morning of the procedure with just enough water to swallow them.  If you have diabetes, make sure that you are scheduled to have your procedure done first thing in the morning, whenever possible.  If you have diabetes, take only half of your insulin dose and notify our nurse that you have done so as soon as you arrive at the clinic.  If you are diabetic, but only take blood sugar pills (oral hypoglycemic), then do not take them on the morning of your procedure.  You may take them after you have had the procedure.  Do not take aspirin or any aspirin-containing medications, at least eleven (11) days prior to the procedure.  They may prolong bleeding.  Wear loose fitting clothing that may be easy to take off and that you would not mind if it got stained with Betadine or blood.  Do not wear any jewelry or perfume  Remove any nail coloring.  It will interfere with some of our monitoring equipment.  NOTE: Remember that this is not meant to be interpreted as a complete list of all possible complications.  Unforeseen problems may occur.  BLOOD THINNERS The following drugs contain aspirin or other products, which can cause increased bleeding during surgery and should not be taken for 2 weeks prior to and 1 week after surgery.  If you should need take something for relief of minor pain, you may take acetaminophen which is found in Tylenol,m Datril, Anacin-3 and Panadol. It is not blood thinner. The products listed below are.  Do not take any of the products listed below in addition to any listed on your instruction sheet.  A.P.C or A.P.C with Codeine Codeine Phosphate Capsules #3 Ibuprofen Ridaura  ABC compound Congesprin Imuran rimadil  Advil Cope Indocin  Robaxisal  Alka-Seltzer Effervescent Pain Reliever and Antacid Coricidin or Coricidin-D  Indomethacin Rufen  Alka-Seltzer plus Cold Medicine Cosprin Ketoprofen S-A-C Tablets  Anacin Analgesic Tablets or Capsules Coumadin Korlgesic Salflex  Anacin Extra Strength Analgesic tablets or capsules CP-2 Tablets Lanoril Salicylate  Anaprox Cuprimine Capsules Levenox Salocol  Anexsia-D Dalteparin Magan Salsalate  Anodynos Darvon compound Magnesium Salicylate Sine-off  Ansaid Dasin Capsules Magsal Sodium Salicylate  Anturane Depen Capsules Marnal Soma  APF Arthritis pain formula Dewitt's Pills Measurin Stanback  Argesic Dia-Gesic Meclofenamic Sulfinpyrazone  Arthritis Bayer Timed Release Aspirin Diclofenac Meclomen Sulindac  Arthritis pain formula Anacin Dicumarol Medipren Supac  Analgesic (Safety coated) Arthralgen Diffunasal Mefanamic Suprofen  Arthritis Strength Bufferin Dihydrocodeine Mepro Compound Suprol  Arthropan liquid Dopirydamole Methcarbomol with Aspirin Synalgos  ASA tablets/Enseals Disalcid Micrainin Tagament  Ascriptin Doan's Midol Talwin  Ascriptin A/D Dolene Mobidin Tanderil  Ascriptin Extra Strength Dolobid Moblgesic Ticlid  Ascriptin with Codeine Doloprin or Doloprin with Codeine Momentum Tolectin  Asperbuf Duoprin Mono-gesic Trendar  Aspergum Duradyne Motrin or Motrin IB Triminicin  Aspirin plain, buffered or enteric coated Durasal  Myochrisine Trigesic  Aspirin Suppositories Easprin Nalfon Trillsate  Aspirin with Codeine Ecotrin Regular or Extra Strength Naprosyn Uracel  Atromid-S Efficin Naproxen Ursinus  Auranofin Capsules Elmiron Neocylate Vanquish  Axotal Emagrin Norgesic Verin  Azathioprine Empirin or Empirin with Codeine Normiflo Vitamin E  Azolid Emprazil Nuprin Voltaren  Bayer Aspirin plain, buffered or children's or timed BC Tablets or powders Encaprin Orgaran Warfarin Sodium  Buff-a-Comp Enoxaparin Orudis Zorpin  Buff-a-Comp with Codeine Equegesic Os-Cal-Gesic    Buffaprin Excedrin plain, buffered or Extra Strength Oxalid   Bufferin Arthritis Strength Feldene Oxphenbutazone   Bufferin plain or Extra Strength Feldene Capsules Oxycodone with Aspirin   Bufferin with Codeine Fenoprofen Fenoprofen Pabalate or Pabalate-SF   Buffets II Flogesic Panagesic   Buffinol plain or Extra Strength Florinal or Florinal with Codeine Panwarfarin   Buf-Tabs Flurbiprofen Penicillamine   Butalbital Compound Four-way cold tablets Penicillin   Butazolidin Fragmin Pepto-Bismol   Carbenicillin Geminisyn Percodan   Carna Arthritis Reliever Geopen Persantine   Carprofen Gold's salt Persistin   Chloramphenicol Goody's Phenylbutazone   Chloromycetin Haltrain Piroxlcam   Clmetidine heparin Plaquenil   Cllnoril Hyco-pap Ponstel   Clofibrate Hydroxy chloroquine Propoxyphen         Before stopping any of these medications, be sure to consult the physician who ordered them.  Some, such as Coumadin (Warfarin) are ordered to prevent or treat serious conditions such as "deep thrombosis", "pumonary embolisms", and other heart problems.  The amount of time that you may need off of the medication may also vary with the medication and the reason for which you were taking it.  If you are taking any of these medications, please make sure you notify your pain physician before you undergo any procedures.

## 2016-02-25 ENCOUNTER — Telehealth: Payer: Self-pay

## 2016-02-25 NOTE — Telephone Encounter (Signed)
Denies any needs at this time. Instructed to call back if needed.

## 2016-04-01 ENCOUNTER — Ambulatory Visit: Payer: BLUE CROSS/BLUE SHIELD | Attending: Pain Medicine | Admitting: Pain Medicine

## 2016-04-01 VITALS — BP 144/73 | HR 89 | Temp 98.6°F | Resp 16 | Ht 61.0 in | Wt 117.0 lb

## 2016-04-01 DIAGNOSIS — G8929 Other chronic pain: Secondary | ICD-10-CM | POA: Diagnosis not present

## 2016-04-01 DIAGNOSIS — M47816 Spondylosis without myelopathy or radiculopathy, lumbar region: Secondary | ICD-10-CM

## 2016-04-01 DIAGNOSIS — M1288 Other specific arthropathies, not elsewhere classified, other specified site: Secondary | ICD-10-CM | POA: Diagnosis not present

## 2016-04-01 DIAGNOSIS — M546 Pain in thoracic spine: Secondary | ICD-10-CM | POA: Diagnosis not present

## 2016-04-01 DIAGNOSIS — Z87891 Personal history of nicotine dependence: Secondary | ICD-10-CM | POA: Diagnosis not present

## 2016-04-01 DIAGNOSIS — M47814 Spondylosis without myelopathy or radiculopathy, thoracic region: Secondary | ICD-10-CM

## 2016-04-01 DIAGNOSIS — Z79899 Other long term (current) drug therapy: Secondary | ICD-10-CM | POA: Insufficient documentation

## 2016-04-01 DIAGNOSIS — M47894 Other spondylosis, thoracic region: Secondary | ICD-10-CM

## 2016-04-01 NOTE — Progress Notes (Signed)
Nursing Pain Medication Assessment:  Safety precautions to be maintained throughout the outpatient stay will include: orient to surroundings, keep bed in low position, maintain call bell within reach at all times, provide assistance with transfer out of bed and ambulation.  Medication Inspection Compliance: Pill count conducted under aseptic conditions, in front of the patient. Neither the pills nor the bottle was removed from the patient's sight at any time. Once count was completed pills were immediately returned to the patient in their original bottle.  Medication: Tramadol (Ultram) Pill Count: 34 of 90 pills remain Bottle Appearance: Standard pharmacy container. Clearly labeled. Filled Date: 09 /20 / 2017 Medication last intake: 04/01/2016

## 2016-04-01 NOTE — Patient Instructions (Signed)
Radiofrequency Lesioning Radiofrequency lesioning is a procedure that is performed to relieve pain. The procedure is often used for back, neck, or arm pain. Radiofrequency lesioning involves the use of a machine that creates radio waves to make heat. During the procedure, the heat is applied to the nerve that carries the pain signal. The heat damages the nerve and interferes with the pain signal. Pain relief usually lasts for 6 months to 1 year. LET [facility] CARE PROVIDER KNOW ABOUT:  Any allergies you have.  All medicines you are taking, including vitamins, herbs, eye drops, creams, and over-the-counter medicines.  Previous problems you or members of your family have had with the use of anesthetics.  Any blood disorders you have.  Previous surgeries you have had.  Any medical conditions you have.  Whether you are pregnant or may be pregnant. RISKS AND COMPLICATIONS Generally, this is a safe procedure. However, problems may occur, including:  Pain or soreness at the injection site.  Infection at the injection site.  Damage to nerves or blood vessels. BEFORE THE PROCEDURE  Ask your health care provider about:  Changing or stopping your regular medicines. This is especially important if you are taking diabetes medicines or blood thinners.  Taking medicines such as aspirin and ibuprofen. These medicines can thin your blood. Do not take these medicines before your procedure if your health care provider instructs you not to.  Follow instructions from your health care provider about eating or drinking restrictions.  Plan to have someone take you home after the procedure.  If you go home right after the procedure, plan to have someone with you for 24 hours. PROCEDURE  You will be given one or more of the following:  A medicine to help you relax (sedative).  A medicine to numb the area (local anesthetic).  You will be awake during the procedure. You will need to be able to  talk with the health care provider during the procedure.  With the help of a type of X-ray (fluoroscopy), the health care provider will insert a radiofrequency needle into the area to be treated.  Next, a wire that carries the radio waves (electrode) will be put through the radiofrequency needle. An electrical pulse will be sent through the electrode to verify the correct nerve. You will feel a tingling sensation, and you may have muscle twitching.  Then, the tissue that is around the needle tip will be heated by an electric current that is passed using the radiofrequency machine. This will numb the nerves.  A bandage (dressing) will be put on the insertion area after the procedure is done. The procedure may vary among health care providers and hospitals. AFTER THE PROCEDURE  Your blood pressure, heart rate, breathing rate, and blood oxygen level will be monitored often until the medicines you were given have worn off.  Return to your normal activities as directed by your health care provider.   This information is not intended to replace advice given to you by your health care provider. Make sure you discuss any questions you have with your health care provider.   Document Released: 01/13/2011 Document Revised: 02/05/2015 Document Reviewed: 06/24/2014 Elsevier Interactive Patient Education 2016 [place] Facet Blocks Patient Information  Description: The facets are joints in the spine between the vertebrae.  Like any joints in the body, facets can become irritated and painful.  Arthritis can also effect the facets.  By injecting steroids and local anesthetic in and around these joints, we can  temporarily block the nerve supply to them.  Steroids act directly on irritated nerves and tissues to reduce selling and inflammation which often leads to decreased pain.  Facet blocks may be done anywhere along the spine from the neck to the low back depending upon the location of your  pain.   After numbing the skin with local anesthetic (like Novocaine), a small needle is passed onto the facet joints under x-ray guidance.  You may experience a sensation of pressure while this is being done.  The entire block usually lasts about 15-25 minutes.   Conditions which may be treated by facet blocks:   Low back/buttock pain  Neck/shoulder pain  Certain types of headaches  Preparation for the injection:  1. Do not eat any solid food or dairy products within 8 hours of your appointment. 2. You may drink clear liquid up to 3 hours before appointment.  Clear liquids include water, black coffee, juice or soda.  No milk or cream please. 3. You may take your regular medication, including pain medications, with a sip of water before your appointment.  Diabetics should hold regular insulin (if taken separately) and take 1/2 normal NPH dose the morning of the procedure.  Carry some sugar containing items with you to your appointment. 4. A driver must accompany you and be prepared to drive you home after your procedure. 5. Bring all your current medications with you. 6. An IV may be inserted and sedation may be given at the discretion of the physician. 7. A blood pressure cuff, EKG and other monitors will often be applied during the procedure.  Some patients may need to have extra oxygen administered for a short period. 8. You will be asked to provide medical information, including your allergies and medications, prior to the procedure.  We must know immediately if you are taking blood thinners (like Coumadin/Warfarin) or if you are allergic to IV iodine contrast (dye).  We must know if you could possible be pregnant.  Possible side-effects:   Bleeding from needle site  Infection (rare, may require surgery)  Nerve injury (rare)  Numbness & tingling (temporary)  Difficulty urinating (rare, temporary)  Spinal headache (a headache worse with upright posture)  Light-headedness  (temporary)  Pain at injection site (serveral days)  Decreased blood pressure (rare, temporary)  Weakness in arm/leg (temporary)  Pressure sensation in back/neck (temporary)   Call if you experience:   Fever/chills associated with headache or increased back/neck pain  Headache worsened by an upright position  New onset, weakness or numbness of an extremity below the injection site  Hives or difficulty breathing (go to the emergency room)  Inflammation or drainage at the injection site(s)  Severe back/neck pain greater than usual  New symptoms which are concerning to you  Please note:  Although the local anesthetic injected can often make your back or neck feel good for several hours after the injection, the pain will likely return. It takes 3-7 days for steroids to work.  You may not notice any pain relief for at least one week.  If effective, we will often do a series of 2-3 injections spaced 3-6 weeks apart to maximally decrease your pain.  After the initial series, you may be a candidate for a more permanent nerve block of the facets.  If you have any questions, please call #[phone]) [facility]

## 2016-04-01 NOTE — Progress Notes (Signed)
Patient's Name: Deanna Jacobson  MRN: [id number]  Referring Provider: [place]: 13-Nov-1955  PCP: [place]: 04/01/2016  Note by: Kathlen Brunswick. Dossie Arbour, MD  Service setting: Ambulatory outpatient  Specialty: Interventional Pain Management  Location: [facility] (AMB) Pain Management Facility    Patient type: Established   Primary Reason(s) for Visit: Encounter for post-procedure evaluation of chronic illness with mild to moderate exacerbation CC: Back Pain (lower)  HPI  Ms. Lekas is a 60 y.o. year old, female patient, who comes today for an initial evaluation. She has Acute renal failure (ARF) ([place]); Chronic pain; Long term current use of opiate analgesic; Long term prescription opiate use; Opiate use; Encounter for therapeutic drug level monitoring; Encounter for pain management planning; Chronic mid back pain; Chronic thoracic spine pain (midline); Thoracic facet syndrome; Lumbar facet arthropathy; Musculoskeletal pain; Neurogenic pain; and Osteoarthritis on her problem list.. Her primarily concern today is the Back Pain (lower)  Pain Assessment: Self-Reported Pain Score: 0-No pain/10             Reported level is compatible with observation.       Pain Type: Chronic pain Pain Location: Back Pain Orientation: Lower Pain Descriptors / Indicators: Aching, Constant, Burning (stinging  pain) Pain Frequency: Intermittent  Ms. Sestak comes in today for post-procedure evaluation after the treatment done on 02/24/2016.  Further details on both, my assessment(s), as well as the proposed treatment plan, please see below.  Post-Procedure Assessment  02/24/2016 Procedure: Bilateral diagnostic T12, L1, L2, & L3 Medial Branch block under fluoroscopic guidance and IV sedation. Influential Factors: BMI: 22.11 kg/m Intra-procedural challenges: None observed Assessment challenges: None detected         Post-procedural side-effects, adverse reactions, or  complications: None reported Reported issues: None  Sedation: Sedation provided. When no sedatives are used, the analgesic levels obtained are directly associated to the effectiveness of the local anesthetics. However, when sedation is provided, the level of analgesia obtained during the initial 1 hour following the intervention, is believed to be the result of a combination of factors. These factors may include, but are not limited to: 1. The effectiveness of the local anesthetics used. 2. The effects of the analgesic(s) and/or anxiolytic(s) used. 3. The degree of discomfort experienced by the patient at the time of the procedure. 4. The patients ability and reliability in recalling and recording the events. 5. The presence and influence of possible secondary gains and/or psychosocial factors. Reported result: Relief experienced during the 1st hour after the procedure: 100 % (Ultra-Short Term Relief) Interpretative annotation: Analgesia during this period is likely to be Local Anesthetic and/or IV Sedative (Analgesic/Anxiolitic) related.          Effects of local anesthetic: The analgesic effects attained during this period are directly associated to the localized infiltration of local anesthetics and therefore cary significant diagnostic value as to the etiological location, or anatomical origin, of the pain. Expected duration of relief is directly dependent on the pharmacodynamics of the local anesthetic used. Long-acting (4-6 hours) anesthetics used.  Reported result: Relief during the next 4 to 6 hour after the procedure: 100 % (Short-Term Relief) Interpretative annotation: Complete relief would suggest area to be the source of the pain.          Long-term benefit: Defined as the period of time past the expected duration of local anesthetics. With the possible exception of prolonged sympathetic blockade from the local anesthetics, benefits during this period are typically attributed to,  or  associated with, other factors such as analgesic sensory neuropraxia, antiinflammatory effects, or beneficial biochemical changes provided by agents other than the local anesthetics Reported result: Extended relief following procedure: 50 % (after the 5th day , had some burning  and stinging pain) (Long-Term Relief) Interpretative annotation: Good relief. This could suggest inflammation to be a significant component in the etiology to the pain.          Current benefits: Defined as persistent relief that continues at this point in time.   Reported results: Treated area: 100 %       Interpretative annotation: Recurrance of symptoms. This would suggest persistent aggravating factors  Interpretation: Results would suggest Ms. Staszak to be a good candidate for Radiofrequency Ablation. The patient has failed to respond to conservative therapies including over-the-counter medications, anti-inflammatories, muscle relaxants, membrane stabilizers, opioids, physical therapy, modalities such as heat and ice, as well as more invasive techniques such as nerve blocks. Because Ms. Harts did attain more than 50% relief of the pain during a series of diagnostic blocks conducted in separate occasions, I believe it is medically necessary to proceed with Radiofrequency Ablation, in order to attempt gaining longer relief.  Laboratory Chemistry  Inflammation Markers Lab Results  Component Value Date   ESRSEDRATE 25 12/05/2015   CRP 0.5 12/05/2015   Renal Function Lab Results  Component Value Date   BUN 13 02/01/2016   CREATININE 1.12 (H) 02/01/2016   GFRAA >60 02/01/2016   GFRNONAA 52 (L) 02/01/2016   Hepatic Function Lab Results  Component Value Date   AST 16 10/28/2015   ALT 11 (L) 10/28/2015   ALBUMIN 3.7 10/28/2015   Electrolytes Lab Results  Component Value Date   NA 137 02/01/2016   K 3.6 02/01/2016   CL 107 02/01/2016   CALCIUM 8.1 (L) 02/01/2016   MG 1.8 12/05/2015   Pain  Modulating Vitamins Lab Results  Component Value Date   25OHVITD1 59 12/05/2015   25OHVITD2 <1.0 12/05/2015   25OHVITD3 59 12/05/2015   VITAMINB12 354 12/05/2015   Coagulation Parameters Lab Results  Component Value Date   PLT 196 02/01/2016   Cardiovascular Lab Results  Component Value Date   HGB 11.0 (L) 02/01/2016   HCT 30.7 (L) 02/01/2016   Note: Lab results reviewed.  Recent Diagnostic Imaging Review  Dg C-arm 1-60 Min-no Report  Result Date: 02/24/2016 CLINICAL DATA: Sub-acute chronic pain syndrome C-ARM 1-60 MINUTES Fluoroscopy was utilized by the requesting physician.  No radiographic interpretation.   Note: Imaging results reviewed.  Meds  The patient has a current medication list which includes the following prescription(s): cyclobenzaprine, lisinopril, meloxicam, tramadol, trazodone, and lyrica.  Current Outpatient Prescriptions on File Prior to Visit  Medication Sig  . cyclobenzaprine (FLEXERIL) 10 MG tablet Take 1 tablet (10 mg total) by mouth every 8 (eight) hours as needed for muscle spasms.  Marland Kitchen lisinopril (PRINIVIL,ZESTRIL) 20 MG tablet Take 1 tablet (20 mg total) by mouth daily.  . meloxicam (MOBIC) 15 MG tablet Take 1 tablet (15 mg total) by mouth daily.  . traMADol (ULTRAM) 50 MG tablet Take 1 tablet (50 mg total) by mouth every 8 (eight) hours as needed for severe pain.  . traZODone (DESYREL) 100 MG tablet Take 100 mg by mouth at bedtime.   Marland Kitchen LYRICA 75 MG capsule Take 1 capsule (75 mg total) by mouth 2 (two) times daily.   No current facility-administered medications on file prior to visit.    ROS  Constitutional: Denies any  fever or chills Gastrointestinal: No reported hemesis, hematochezia, vomiting, or acute GI distress Musculoskeletal: Denies any acute onset joint swelling, redness, loss of ROM, or weakness Neurological: No reported episodes of acute onset apraxia, aphasia, dysarthria, agnosia, amnesia, paralysis, loss of coordination, or loss of  consciousness  Allergies  Ms. Degrace is allergic to codeine.  [place]  Drug: Ms. Totzke  reports that she uses drugs. Alcohol:  reports that she drinks alcohol. Tobacco:  reports that she has quit smoking. She has never used smokeless tobacco. Medical:  has a past medical history of Chronic back pain and Hypertension. Family: family history includes Cancer in her mother; Hypertension in her father.  Past Surgical History:  Procedure Laterality Date  . BREAST ENHANCEMENT SURGERY     Constitutional Exam  General appearance: Well nourished, well developed, and well hydrated. In no apparent acute distress Vitals:   04/01/16 1006  BP: (!) 144/73  Pulse: 89  Resp: 16  Temp: 98.6 F (37 C)  TempSrc: Oral  SpO2: 98%  Weight: 117 lb (53.1 kg)  Height: '5\' 1"'$  (1.549 m)   BMI Assessment: Estimated body mass index is 22.11 kg/m as calculated from the following:   Height as of this encounter: '5\' 1"'$  (1.549 m).   Weight as of this encounter: 117 lb (53.1 kg).  BMI interpretation table: BMI level Category Range association with higher incidence of chronic pain  <18 kg/m2 Underweight   18.5-24.9 kg/m2 Ideal body weight   25-29.9 kg/m2 Overweight Increased incidence by 20%  30-34.9 kg/m2 Obese (Class I) Increased incidence by 68%  35-39.9 kg/m2 Severe obesity (Class II) Increased incidence by 136%  >40 kg/m2 Extreme obesity (Class III) Increased incidence by 254%   BMI Readings from Last 4 Encounters:  04/01/16 22.11 kg/m  02/24/16 22.11 kg/m  02/18/16 22.11 kg/m  02/01/16 22.11 kg/m   Wt Readings from Last 4 Encounters:  04/01/16 117 lb (53.1 kg)  02/24/16 117 lb (53.1 kg)  02/18/16 117 lb (53.1 kg)  02/01/16 117 lb (53.1 kg)  Psych/Mental status: Alert, oriented x 3 (person, place, & time) Eyes: PERLA Respiratory: No evidence of acute respiratory distress  Cervical Spine Exam  Inspection: No masses, redness, or swelling Alignment: Symmetrical Functional ROM:  Unrestricted ROM Stability: No instability detected Muscle strength & Tone: Functionally intact Sensory: Unimpaired Palpation: Non-contributory  Upper Extremity (UE) Exam    Side: Right upper extremity  Side: Left upper extremity  Inspection: No masses, redness, swelling, or asymmetry  Inspection: No masses, redness, swelling, or asymmetry  Functional ROM: Unrestricted ROM         Functional ROM: Unrestricted ROM          Muscle strength & Tone: Functionally intact  Muscle strength & Tone: Functionally intact  Sensory: Unimpaired  Sensory: Unimpaired  Palpation: Non-contributory  Palpation: Non-contributory   Thoracic Spine Exam  Inspection: No masses, redness, or swelling Alignment: Symmetrical Functional ROM: Unrestricted ROM Stability: No instability detected Sensory: Unimpaired Muscle strength & Tone: Functionally intact Palpation: Non-contributory  Lumbar Spine Exam  Inspection: No masses, redness, or swelling Alignment: Symmetrical Functional ROM: Unrestricted ROM Stability: No instability detected Muscle strength & Tone: Functionally intact Sensory: Unimpaired Palpation: Non-contributory Provocative Tests: Lumbar Hyperextension and rotation test: evaluation deferred today       Patrick's Maneuver: evaluation deferred today              Gait & Posture Assessment  Ambulation: Unassisted Gait: Relatively normal for age and body habitus Posture: WNL  Lower Extremity Exam    Side: Right lower extremity  Side: Left lower extremity  Inspection: No masses, redness, swelling, or asymmetry  Inspection: No masses, redness, swelling, or asymmetry  Functional ROM: Unrestricted ROM          Functional ROM: Unrestricted ROM          Muscle strength & Tone: Functionally intact  Muscle strength & Tone: Functionally intact  Sensory: Unimpaired  Sensory: Unimpaired  Palpation: Non-contributory  Palpation: Non-contributory   Assessment  Primary Diagnosis & Pertinent Problem  List: The primary encounter diagnosis was Chronic thoracic spine pain (midline). Diagnoses of Lumbar facet arthropathy and Thoracic facet syndrome were also pertinent to this visit.  Visit Diagnosis: 1. Chronic thoracic spine pain (midline)   2. Lumbar facet arthropathy   3. Thoracic facet syndrome    Plan of Care  Pharmacotherapy (Medications Ordered): No orders of the defined types were placed in this encounter.  New Prescriptions   No medications on file   Medications administered during this visit: Ms. Copus had no medications administered during this visit. Lab-work, Procedure(s), & Referral(s) Ordered: Orders Placed This Encounter  Procedures  . Radiofrequency,Thoracic   Imaging & Referral(s) Ordered: None  Interventional Therapies: Pending/Scheduled/Planned:   Bilateral T12, L1, L2, & L3 Medial Branch Level(s) radiofrequency ablation    Considering:   Bilateral T12, L1, L2, & L3 Medial Branch Level(s) radiofrequency ablation    PRN Procedures:   Bilateral T12, L1, L2, & L3 Medial Branch Level(s) radiofrequency ablation    Requested PM Follow-up: Return for Schedule Procedure, (ASAA).  Future Appointments Date Time Provider [place]  08/10/2016 11:15 AM Milinda Pointer, MD [facility] None   Primary Care Physician: [facility] Location: [facility] Outpatient Pain Management Facility Note by: Kathlen Brunswick. Dossie Arbour, M.D, DABA, DABAPM, DABPM, DABIPP, FIPP  Pain Score Disclaimer: We use the NRS-11 scale. This is a self-reported, subjective measurement of pain severity with only modest accuracy. It is used primarily to identify changes within a particular patient. It must be understood that outpatient pain scales are significantly less accurate that those used for research, where they can be applied under ideal controlled circumstances with minimal exposure to variables. In reality, the score is likely to be a combination of pain intensity and pain  affect, where pain affect describes the degree of emotional arousal or changes in action readiness caused by the sensory experience of pain. Factors such as social and work situation, setting, emotional state, anxiety levels, expectation, and prior pain experience may influence pain perception and show large inter-individual differences that may also be affected by time variables.  Patient instructions provided during this appointment: Patient Instructions  Radiofrequency Lesioning Radiofrequency lesioning is a procedure that is performed to relieve pain. The procedure is often used for back, neck, or arm pain. Radiofrequency lesioning involves the use of a machine that creates radio waves to make heat. During the procedure, the heat is applied to the nerve that carries the pain signal. The heat damages the nerve and interferes with the pain signal. Pain relief usually lasts for 6 months to 1 year. LET [facility] CARE PROVIDER KNOW ABOUT:  Any allergies you have.  All medicines you are taking, including vitamins, herbs, eye drops, creams, and over-the-counter medicines.  Previous problems you or members of your family have had with the use of anesthetics.  Any blood disorders you have.  Previous surgeries you have had.  Any medical conditions you have.  Whether you are pregnant or  may be pregnant. RISKS AND COMPLICATIONS Generally, this is a safe procedure. However, problems may occur, including:  Pain or soreness at the injection site.  Infection at the injection site.  Damage to nerves or blood vessels. BEFORE THE PROCEDURE  Ask your health care provider about:  Changing or stopping your regular medicines. This is especially important if you are taking diabetes medicines or blood thinners.  Taking medicines such as aspirin and ibuprofen. These medicines can thin your blood. Do not take these medicines before your procedure if your health care provider instructs you not  to.  Follow instructions from your health care provider about eating or drinking restrictions.  Plan to have someone take you home after the procedure.  If you go home right after the procedure, plan to have someone with you for 24 hours. PROCEDURE  You will be given one or more of the following:  A medicine to help you relax (sedative).  A medicine to numb the area (local anesthetic).  You will be awake during the procedure. You will need to be able to talk with the health care provider during the procedure.  With the help of a type of X-ray (fluoroscopy), the health care provider will insert a radiofrequency needle into the area to be treated.  Next, a wire that carries the radio waves (electrode) will be put through the radiofrequency needle. An electrical pulse will be sent through the electrode to verify the correct nerve. You will feel a tingling sensation, and you may have muscle twitching.  Then, the tissue that is around the needle tip will be heated by an electric current that is passed using the radiofrequency machine. This will numb the nerves.  A bandage (dressing) will be put on the insertion area after the procedure is done. The procedure may vary among health care providers and hospitals. AFTER THE PROCEDURE  Your blood pressure, heart rate, breathing rate, and blood oxygen level will be monitored often until the medicines you were given have worn off.  Return to your normal activities as directed by your health care provider.   This information is not intended to replace advice given to you by your health care provider. Make sure you discuss any questions you have with your health care provider.   Document Released: 01/13/2011 Document Revised: 02/05/2015 Document Reviewed: 06/24/2014 Elsevier Interactive Patient Education 2016 [place] Facet Blocks Patient Information  Description: The facets are joints in the spine between the vertebrae.  Like any joints  in the body, facets can become irritated and painful.  Arthritis can also effect the facets.  By injecting steroids and local anesthetic in and around these joints, we can temporarily block the nerve supply to them.  Steroids act directly on irritated nerves and tissues to reduce selling and inflammation which often leads to decreased pain.  Facet blocks may be done anywhere along the spine from the neck to the low back depending upon the location of your pain.   After numbing the skin with local anesthetic (like Novocaine), a small needle is passed onto the facet joints under x-ray guidance.  You may experience a sensation of pressure while this is being done.  The entire block usually lasts about 15-25 minutes.   Conditions which may be treated by facet blocks:   Low back/buttock pain  Neck/shoulder pain  Certain types of headaches  Preparation for the injection:  1. Do not eat any solid food or dairy products within 8 hours of your appointment.  2. You may drink clear liquid up to 3 hours before appointment.  Clear liquids include water, black coffee, juice or soda.  No milk or cream please. 3. You may take your regular medication, including pain medications, with a sip of water before your appointment.  Diabetics should hold regular insulin (if taken separately) and take 1/2 normal NPH dose the morning of the procedure.  Carry some sugar containing items with you to your appointment. 4. A driver must accompany you and be prepared to drive you home after your procedure. 5. Bring all your current medications with you. 6. An IV may be inserted and sedation may be given at the discretion of the physician. 7. A blood pressure cuff, EKG and other monitors will often be applied during the procedure.  Some patients may need to have extra oxygen administered for a short period. 8. You will be asked to provide medical information, including your allergies and medications, prior to the procedure.  We  must know immediately if you are taking blood thinners (like Coumadin/Warfarin) or if you are allergic to IV iodine contrast (dye).  We must know if you could possible be pregnant.  Possible side-effects:   Bleeding from needle site  Infection (rare, may require surgery)  Nerve injury (rare)  Numbness & tingling (temporary)  Difficulty urinating (rare, temporary)  Spinal headache (a headache worse with upright posture)  Light-headedness (temporary)  Pain at injection site (serveral days)  Decreased blood pressure (rare, temporary)  Weakness in arm/leg (temporary)  Pressure sensation in back/neck (temporary)   Call if you experience:   Fever/chills associated with headache or increased back/neck pain  Headache worsened by an upright position  New onset, weakness or numbness of an extremity below the injection site  Hives or difficulty breathing (go to the emergency room)  Inflammation or drainage at the injection site(s)  Severe back/neck pain greater than usual  New symptoms which are concerning to you  Please note:  Although the local anesthetic injected can often make your back or neck feel good for several hours after the injection, the pain will likely return. It takes 3-7 days for steroids to work.  You may not notice any pain relief for at least one week.  If effective, we will often do a series of 2-3 injections spaced 3-6 weeks apart to maximally decrease your pain.  After the initial series, you may be a candidate for a more permanent nerve block of the facets.  If you have any questions, please call #[phone]) [facility]

## 2016-04-02 ENCOUNTER — Encounter: Payer: Self-pay | Admitting: Pain Medicine

## 2016-04-14 ENCOUNTER — Telehealth: Payer: Self-pay | Admitting: Pain Medicine

## 2016-04-14 NOTE — Telephone Encounter (Signed)
Left message for patient to call us back. She is ok to come in for facet block on Dec 4t h.

## 2016-04-14 NOTE — Telephone Encounter (Signed)
Patient is having increased pain with burning and stinging in middle and low back of spine. She'd like to have another facet block in the 1st part of Dec. - ordered (RFA is scheduled for NAD of 06/30/2016 pending Jan authorization request)  Please enter order if its okay for patient to have another facet block - no prior auth required and forward to front office staff to schedule  Thank you
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
## 2016-04-20 NOTE — Telephone Encounter (Signed)
Left message for patient to call office to notify her that she could come in on Dec 4th for procedure.

## 2016-05-05 ENCOUNTER — Other Ambulatory Visit: Payer: Self-pay | Admitting: Pain Medicine

## 2016-05-05 DIAGNOSIS — M792 Neuralgia and neuritis, unspecified: Secondary | ICD-10-CM

## 2016-06-30 ENCOUNTER — Ambulatory Visit
Admission: RE | Admit: 2016-06-30 | Discharge: 2016-06-30 | Disposition: A | Discharge status: Home / Self Care | Payer: BLUE CROSS/BLUE SHIELD | Source: Ambulatory Visit | Attending: Pain Medicine | Admitting: Pain Medicine

## 2016-06-30 ENCOUNTER — Ambulatory Visit (HOSPITAL_BASED_OUTPATIENT_CLINIC_OR_DEPARTMENT_OTHER): Payer: BLUE CROSS/BLUE SHIELD | Admitting: Pain Medicine

## 2016-06-30 ENCOUNTER — Encounter: Payer: Self-pay | Admitting: Pain Medicine

## 2016-06-30 VITALS — BP 194/79 | HR 83 | Temp 98.2°F | Resp 16 | Ht 61.0 in | Wt 117.0 lb

## 2016-06-30 DIAGNOSIS — M47816 Spondylosis without myelopathy or radiculopathy, lumbar region: Secondary | ICD-10-CM

## 2016-06-30 DIAGNOSIS — M159 Polyosteoarthritis, unspecified: Secondary | ICD-10-CM

## 2016-06-30 DIAGNOSIS — G8929 Other chronic pain: Secondary | ICD-10-CM

## 2016-06-30 DIAGNOSIS — G894 Chronic pain syndrome: Secondary | ICD-10-CM | POA: Insufficient documentation

## 2016-06-30 DIAGNOSIS — M47894 Other spondylosis, thoracic region: Secondary | ICD-10-CM

## 2016-06-30 DIAGNOSIS — M546 Pain in thoracic spine: Secondary | ICD-10-CM | POA: Insufficient documentation

## 2016-06-30 DIAGNOSIS — M791 Myalgia: Secondary | ICD-10-CM

## 2016-06-30 DIAGNOSIS — M150 Primary generalized (osteo)arthritis: Secondary | ICD-10-CM

## 2016-06-30 DIAGNOSIS — M7918 Myalgia, other site: Secondary | ICD-10-CM

## 2016-06-30 DIAGNOSIS — M1288 Other specific arthropathies, not elsewhere classified, other specified site: Secondary | ICD-10-CM | POA: Diagnosis not present

## 2016-06-30 DIAGNOSIS — M545 Low back pain: Secondary | ICD-10-CM | POA: Insufficient documentation

## 2016-06-30 DIAGNOSIS — M792 Neuralgia and neuritis, unspecified: Secondary | ICD-10-CM

## 2016-06-30 DIAGNOSIS — M47814 Spondylosis without myelopathy or radiculopathy, thoracic region: Principal | ICD-10-CM

## 2016-06-30 MED ORDER — ROPIVACAINE HCL 2 MG/ML IJ SOLN
9.0000 mL | Freq: Once | INTRAMUSCULAR | Status: AC
  Administered 2016-06-30: 9 mL

## 2016-06-30 MED ORDER — ROPIVACAINE HCL 2 MG/ML IJ SOLN
INTRAMUSCULAR | Status: AC
  Filled 2016-06-30: qty 10

## 2016-06-30 MED ORDER — TRAMADOL HCL 50 MG PO TABS: 50.0000 mg | ORAL_TABLET | Freq: Four times a day (QID) | ORAL | 2 refills | Status: DC | PRN

## 2016-06-30 MED ORDER — LIDOCAINE HCL (PF) 1 % IJ SOLN
10.0000 mL | Freq: Once | INTRAMUSCULAR | Status: AC
  Administered 2016-06-30: 10 mL

## 2016-06-30 MED ORDER — ROPIVACAINE HCL 2 MG/ML IJ SOLN: 9.0000 mL | Freq: Once | INTRAMUSCULAR | Status: DC

## 2016-06-30 MED ORDER — MELOXICAM 15 MG PO TABS: 15.0000 mg | ORAL_TABLET | Freq: Every day | ORAL | 2 refills | Status: DC

## 2016-06-30 MED ORDER — MIDAZOLAM HCL 5 MG/5ML IJ SOLN
1.0000 mg | INTRAMUSCULAR | Status: DC | PRN
  Administered 2016-06-30: 1 mg via INTRAVENOUS
  Filled 2016-06-30: qty 5

## 2016-06-30 MED ORDER — LYRICA 75 MG PO CAPS: 75.0000 mg | ORAL_CAPSULE | Freq: Three times a day (TID) | ORAL | 2 refills | Status: DC

## 2016-06-30 MED ORDER — LACTATED RINGERS IV SOLN: 1000.0000 mL | Freq: Once | INTRAVENOUS | Status: DC

## 2016-06-30 MED ORDER — TRIAMCINOLONE ACETONIDE 40 MG/ML IJ SUSP
INTRAMUSCULAR | Status: AC
  Filled 2016-06-30: qty 1

## 2016-06-30 MED ORDER — FENTANYL CITRATE (PF) 100 MCG/2ML IJ SOLN
25.0000 ug | INTRAMUSCULAR | Status: DC | PRN
  Administered 2016-06-30: 50 ug via INTRAVENOUS
  Filled 2016-06-30: qty 2

## 2016-06-30 MED ORDER — TRIAMCINOLONE ACETONIDE 40 MG/ML IJ SUSP: 40.0000 mg | Freq: Once | INTRAMUSCULAR | Status: DC

## 2016-06-30 MED ORDER — TRIAMCINOLONE ACETONIDE 40 MG/ML IJ SUSP
40.0000 mg | Freq: Once | INTRAMUSCULAR | Status: AC
  Administered 2016-06-30: 40 mg

## 2016-06-30 MED ORDER — CYCLOBENZAPRINE HCL 10 MG PO TABS: 10.0000 mg | ORAL_TABLET | Freq: Three times a day (TID) | ORAL | 2 refills | Status: DC | PRN

## 2016-06-30 NOTE — Progress Notes (Signed)
Safety precautions to be maintained throughout the outpatient stay will include: orient to surroundings, keep bed in low position, maintain call bell within reach at all times, provide assistance with transfer out of bed and ambulation.  

## 2016-06-30 NOTE — Progress Notes (Signed)
Patient's Name: Deanna Jacobson  MRN: [id number]  Referring Provider: [place]: Jan 26, 1956  PCP: El [place]  DOS: 06/30/2016  Note by: Kathlen Brunswick. Dossie Arbour, MD  Service setting: Ambulatory outpatient  Location: [facility] (AMB) Pain Management Facility  Visit type: Procedure  Specialty: Interventional Pain Management  Patient type: Established   Primary Reason for Visit: Interventional Pain Management Treatment. CC: Back Pain (lower, center)  Procedure:  Anesthesia, Analgesia, Anxiolysis:  Type: Therapeutic Thoracic Medial Nerve Radiofrequency Ablation Region: Posterior   Level: L1, L2, L3,  and T12-L1 Medial Branch Level(s) Laterality: Left Paraspinal  Type: Local Anesthesia with Moderate (Conscious) Sedation Local Anesthetic: Lidocaine 1% Route: Intravenous (IV) IV Access: Secured Sedation: Meaningful verbal contact was maintained at all times during the procedure  Indication(s): Analgesia and Anxiety  Indications: 1. Thoracic facet syndrome   2. Chronic thoracic spine pain (midline)   3. Lumbar facet arthropathy   4. Neurogenic pain   5. Primary osteoarthritis involving multiple joints   6. Musculoskeletal pain   7. Chronic pain syndrome    Deanna Jacobson has either failed to respond, was unable to tolerate, or simply did not get enough benefit from other more conservative therapies including, but not limited to: 1. Over-the-counter medications 2. Anti-inflammatory medications 3. Muscle relaxants 4. Membrane stabilizers 5. Opioids 6. Physical therapy 7. Modalities (Heat, ice, etc.) 8. Invasive techniques such as nerve blocks. Deanna Jacobson has attained more than 50% relief of the pain from a series of diagnostic injections conducted in separate occasions.  Pain Score: Pre-procedure: 3 /10 Post-procedure: 0-No pain/10  Pre-op Assessment:  Previous date of service: 04/01/16 Service provided: Med Refill Deanna Jacobson is a 61  y.o. (year old), female patient, seen today for interventional treatment. She  has a past surgical history that includes Breast enhancement surgery. Her primarily concern today is the Back Pain (lower, center)  Initial Vital Signs: Blood pressure (!) 178/88, pulse 97, temperature 98.4 F (36.9 C), temperature source Oral, resp. rate 16, height '5\' 1"'$  (1.549 m), weight 117 lb (53.1 kg), SpO2 100 %. BMI: 22.11 kg/m  Risk Assessment: Allergies: Reviewed. She is allergic to codeine.  Allergy Precautions: None required Coagulopathies: "Reviewed. None identified.  Blood-thinner therapy: None at this time Active Infection(s): Reviewed. None identified. Deanna Jacobson is afebrile  Site Confirmation: Deanna Jacobson was asked to confirm the procedure and laterality before marking the site Procedure checklist: Completed Consent: Before the procedure and under the influence of no sedative(s), amnesic(s), or anxiolytics, the patient was informed of the treatment options, risks and possible complications. To fulfill our ethical and legal obligations, as recommended by the American Medical Association's Code of Ethics, I have informed the patient of my clinical impression; the nature and purpose of the treatment or procedure; the risks, benefits, and possible complications of the intervention; the alternatives, including doing nothing; the risk(s) and benefit(s) of the alternative treatment(s) or procedure(s); and the risk(s) and benefit(s) of doing nothing. The patient was provided information about the general risks and possible complications associated with the procedure. These may include, but are not limited to: failure to achieve desired goals, infection, bleeding, organ or nerve damage, allergic reactions, paralysis, and death. In addition, the patient was informed of those risks and complications associated to Spine-related procedures, such as failure to decrease pain; infection (i.e.: Meningitis, epidural  or intraspinal abscess); bleeding (i.e.: epidural hematoma, subarachnoid hemorrhage, or any other type of intraspinal or peri-dural bleeding); organ or nerve damage (i.e.: Any  type of peripheral nerve, nerve root, or spinal cord injury) with subsequent damage to sensory, motor, and/or autonomic systems, resulting in permanent pain, numbness, and/or weakness of one or several areas of the body; allergic reactions; (i.e.: anaphylactic reaction); and/or death. Furthermore, the patient was informed of those risks and complications associated with the medications. These include, but are not limited to: allergic reactions (i.e.: anaphylactic or anaphylactoid reaction(s)); adrenal axis suppression; blood sugar elevation that in diabetics may result in ketoacidosis or comma; water retention that in patients with history of congestive heart failure may result in shortness of breath, pulmonary edema, and decompensation with resultant heart failure; weight gain; swelling or edema; medication-induced neural toxicity; particulate matter embolism and blood vessel occlusion with resultant organ, and/or nervous system infarction; and/or aseptic necrosis of one or more joints. Finally, the patient was informed that Medicine is not an exact science; therefore, there is also the possibility of unforeseen or unpredictable risks and/or possible complications that may result in a catastrophic outcome. The patient indicated having understood very clearly. We have given the patient no guarantees and we have made no promises. Enough time was given to the patient to ask questions, all of which were answered to the patient's satisfaction. Deanna Jacobson has indicated that she wanted to continue with the procedure. Attestation: I, the ordering provider, attest that I have discussed with the patient the benefits, risks, side-effects, alternatives, likelihood of achieving goals, and potential problems during recovery for the procedure that I  have provided informed consent. Date: 06/30/2016; Time: 11:43 AM  Pre-Procedure Preparation:  Monitoring: As per clinic protocol. Respiration, ETCO2, SpO2, BP, heart rate and rhythm monitor placed and checked for adequate function Safety Precautions: Patient was assessed for positional comfort and pressure points before starting the procedure. Time-out: I initiated and conducted the "Time-out" before starting the procedure, as per protocol. The patient was asked to participate by confirming the accuracy of the "Time Out" information. Verification of the correct person, site, and procedure were performed and confirmed by me, the nursing staff, and the patient. "Time-out" conducted as per Joint Commission's Universal Protocol (UP.01.01.01). "Time-out" Date & Time: 06/30/2016; 1150 hrs.  Description of Procedure Process:   Position: Prone Target Area: For Lumbar Facet block(s), the target is the groove formed by the junction of the transverse process and superior articular process. For the L5 dorsal ramus, the target is the notch between superior articular process and sacral ala. For the S1 dorsal ramus, the target is the superior and lateral edge of the posterior S1 Sacral foramen. Approach: Paraspinal approach. Area Prepped: Entire Upper Back Area Prepping solution: Hibiclens (4.0% Chlorhexidine gluconate solution) Safety Precautions: Aspiration looking for blood return was conducted prior to all injections. At no point did we inject any substances, as a needle was being advanced. No attempts were made at seeking any paresthesias. Safe injection practices and needle disposal techniques used. Medications properly checked for expiration dates. SDV (single dose vial) medications used. Description of the Procedure: Protocol guidelines were followed. The patient was placed in position over the procedure table. The target area was identified and the area prepped in the usual manner. Skin desensitized using  vapocoolant spray. Skin & deeper tissues infiltrated with local anesthetic. Appropriate amount of time allowed to pass for local anesthetics to take effect. The Radiofrequency needles were introduced to the area of the medial branch at the junction of the superior articular process and transverse process using fluoroscopy. Sensory stimulation using 50 Hz was used to locate &  identify the nerve, making sure that the needle was positioned such that there was no sensory stimulation below 0.3 V or above 0.7 V. Stimulation using 2 Hz was used to evaluate the motor component. Care was taken not to lesion any nerves that demonstrated motor stimulation of the lower extremities at an output of less than 2.5 times that of the sensory threshold, or a maximum of 2.0 V. Once satisfactory placement of the needles was achieved, the above solution was slowly injected after negative aspiration. After waiting for at least 2 minutes, the ablation was performed at 80 degrees C for 60 seconds. The needles were then removed and the area cleansed, making sure to leave some of the prepping solution back to take advantage of its long term bactericidal properties. Start Time: 1150 hrs. End Time: 1218 hrs. Materials & Medications:  Needle(s) Type: Teflon-coated, curved tip, Radiofrequency needle(s) Gauge: 22G Length: 10cm Medication(s): We administered fentaNYL, midazolam, lidocaine (PF), ropivacaine (PF) 2 mg/mL (0.2%), and triamcinolone acetonide. Please see chart orders for dosing details.  Imaging Guidance (Spinal):  Type of Imaging Technique: Fluoroscopy Guidance (Spinal) Indication(s): Assistance in needle guidance and placement for procedures requiring needle placement in or near specific anatomical locations not easily accessible without such assistance. Exposure Time: Please see nurses notes. Contrast: None used. Fluoroscopic Guidance: I was personally present during the use of fluoroscopy. "Tunnel Vision Technique"  used to obtain the best possible view of the target area. Parallax error corrected before commencing the procedure. "Direction-depth-direction" technique used to introduce the needle under continuous pulsed fluoroscopy. Once target was reached, antero-posterior, oblique, and lateral fluoroscopic projection used confirm needle placement in all planes. Images permanently stored in EMR. Interpretation: No contrast injected. I personally interpreted the imaging intraoperatively. Adequate needle placement confirmed in multiple planes. Permanent images saved into the patient's record.  Antibiotic Prophylaxis:  Indication(s): None identified Antibiotic given: None  Post-operative Assessment:  EBL: None Complications: No immediate post-treatment complications observed by team, or reported by patient. Note: The patient tolerated the entire procedure well. A repeat set of vitals were taken after the procedure and the patient was kept under observation following institutional policy, for this type of procedure. Post-procedural neurological assessment was performed, showing return to baseline, prior to discharge. The patient was provided with post-procedure discharge instructions, including a section on how to identify potential problems. Should any problems arise concerning this procedure, the patient was given instructions to immediately contact us, at any time, without hesitation. In any case, we plan to contact the patient by telephone for a follow-up status report regarding this interventional procedure. Comments:  No additional relevant information.  Plan of Care  Disposition: Discharge home  Discharge Date & Time: 06/30/2016; 1300 hrs.  Physician-requested Follow-up:  Return in about 2 weeks (around 07/14/2016) for Procedure: Right thoracic facet RFA.  Future Appointments Date Time Provider [place]  08/10/2016 11:15 AM Milinda Pointer, MD [facility] None   Medications ordered for  procedure: Meds ordered this encounter  Medications  . DISCONTD: LYRICA 75 MG capsule    Sig: Take 1 capsule (75 mg total) by mouth 3 (three) times daily.    Dispense:  90 capsule    Refill:  2    Do not add this medication to the electronic "Automatic Refill" notification system. Patient may have prescription filled one day early if pharmacy is closed on scheduled refill date.  . meloxicam (MOBIC) 15 MG tablet    Sig: Take 1 tablet (15 mg total) by mouth daily.  Dispense:  30 tablet    Refill:  2    Do not add this medication to the electronic "Automatic Refill" notification system. Patient may have prescription filled one day early if pharmacy is closed on scheduled refill date.  . cyclobenzaprine (FLEXERIL) 10 MG tablet    Sig: Take 1 tablet (10 mg total) by mouth every 8 (eight) hours as needed for muscle spasms.    Dispense:  90 tablet    Refill:  2    Do not add this medication to the electronic "Automatic Refill" notification system. Patient may have prescription filled one day early if pharmacy is closed on scheduled refill date.  Marland Kitchen DISCONTD: traMADol (ULTRAM) 50 MG tablet    Sig: Take 1-2 tablets (50-100 mg total) by mouth every 6 (six) hours as needed for severe pain.    Dispense:  90 tablet    Refill:  2    Do not add this medication to the electronic "Automatic Refill" notification system. Patient may have prescription filled one day early if pharmacy is closed on scheduled refill date. Do not fill until: 02/18/16 To last until: 08/16/16  . fentaNYL (SUBLIMAZE) injection 25-50 mcg    Make sure Narcan is available in the pyxis when using this medication. In the event of respiratory depression (RR< 8/min): Titrate NARCAN (naloxone) in increments of 0.1 to 0.2 mg IV at 2-3 minute intervals, until desired degree of reversal.  . lactated ringers infusion 1,000 mL  . midazolam (VERSED) 5 MG/5ML injection 1-2 mg    Make sure Flumazenil is available in the pyxis when using this  medication. If oversedation occurs, administer 0.2 mg IV over 15 sec. If after 45 sec no response, administer 0.2 mg again over 1 min; may repeat at 1 min intervals; not to exceed 4 doses (1 mg)  . lidocaine (PF) (XYLOCAINE) 1 % injection 10 mL  . ropivacaine (PF) 2 mg/mL (0.2%) (NAROPIN) injection 9 mL  . ropivacaine (PF) 2 mg/mL (0.2%) (NAROPIN) injection 9 mL  . triamcinolone acetonide (KENALOG-40) injection 40 mg  . triamcinolone acetonide (KENALOG-40) injection 40 mg  . ropivacaine (PF) 2 mg/mL (0.2%) (NAROPIN) 2 MG/ML injection    [place], Patti: cabinet override  . triamcinolone acetonide (KENALOG-40) 40 MG/ML injection    [place], Patti: cabinet override  . LYRICA 75 MG capsule    Sig: Take 1 capsule (75 mg total) by mouth 3 (three) times daily.    Dispense:  90 capsule    Refill:  2    Do not add this medication to the electronic "Automatic Refill" notification system. Patient may have prescription filled one day early if pharmacy is closed on scheduled refill date.  . traMADol (ULTRAM) 50 MG tablet    Sig: Take 1-2 tablets (50-100 mg total) by mouth every 6 (six) hours as needed for severe pain.    Dispense:  90 tablet    Refill:  2    Do not add this medication to the electronic "Automatic Refill" notification system. Patient may have prescription filled one day early if pharmacy is closed on scheduled refill date. Do not fill until: 06/30/16 To last until: 09/28/16   Medications administered: We administered fentaNYL, midazolam, lidocaine (PF), ropivacaine (PF) 2 mg/mL (0.2%), and triamcinolone acetonide.  See the medical record for exact dosing, route, and time of administration.  Lab-work, Procedure(s), & Referral(s) Ordered: Orders Placed This Encounter  Procedures  . Radiofrequency,Thoracic  . DG C-Arm 1-60 Min-No Report  . Discharge instructions  .  Follow-up  . Informed Consent Details: Transcribe to consent form and obtain patient signature  . Provider attestation of  informed consent for procedure/surgical case  . Verify informed consent   Imaging Ordered: Results for orders placed in visit on 02/24/16  DG C-Arm 1-60 Min-No Report   Narrative CLINICAL DATA: Sub-acute chronic pain syndrome   C-ARM 1-60 MINUTES  Fluoroscopy was utilized by the requesting physician.  No radiographic  interpretation.     New Prescriptions   No medications on file   Primary Care Physician: [place] Location: [facility] Note by: Kathlen Brunswick. Dossie Arbour, M.D, DABA, DABAPM, DABPM, DABIPP, FIPP Date: 06/30/2016; Time: 1:10 PM  Disclaimer:  Medicine is not an exact science. The only guarantee in medicine is that nothing is guaranteed. It is important to note that the decision to proceed with this intervention was based on the information collected from the patient. The Data and conclusions were drawn from the patient's questionnaire, the interview, and the physical examination. Because the information was provided in large part by the patient, it cannot be guaranteed that it has not been purposely or unconsciously manipulated. Every effort has been made to obtain as much relevant data as possible for this evaluation. It is important to note that the conclusions that lead to this procedure are derived in large part from the available data. Always take into account that the treatment will also be dependent on availability of resources and existing treatment guidelines, considered by other Pain Management Practitioners as being common knowledge and practice, at the time of the intervention. For Medico-Legal purposes, it is also important to point out that variation in procedural techniques and pharmacological choices are the acceptable norm. The indications, contraindications, technique, and results of the above procedure should only be interpreted and judged by a Board-Certified Interventional Pain Specialist with extensive  familiarity and expertise in the same exact procedure and technique. Attempts at providing opinions without similar or greater experience and expertise than that of the treating physician will be considered as inappropriate and unethical, and shall result in a formal complaint to the state medical board and applicable specialty societies.  Instructions provided at this appointment: Patient Instructions  Pain Management Discharge Instructions  General Discharge Instructions :  If you need to reach your doctor call: Monday-Friday 8:00 am - 4:00 pm at [phone] or toll free [phone].  After clinic hours [phone] to have operator reach doctor.  Bring all of your medication bottles to all your appointments in the pain clinic.  To cancel or reschedule your appointment with Pain Management please remember to call 24 hours in advance to avoid a fee.  Refer to the educational materials which you have been given on: General Risks, I had my Procedure. Discharge Instructions, Post Sedation.  Post Procedure Instructions:  The drugs you were given will stay in your system until tomorrow, so for the next 24 hours you should not drive, make any legal decisions or drink any alcoholic beverages.  You may eat anything you prefer, but it is better to start with liquids then soups and crackers, and gradually work up to solid foods.  Please notify your doctor immediately if you have any unusual bleeding, trouble breathing or pain that is not related to your normal pain.  Depending on the type of procedure that was done, some parts of your body may feel week and/or numb.  This usually clears up by tonight or the next day.  Walk with  the use of an assistive device or accompanied by an adult for the 24 hours.  You may use ice on the affected area for the first 24 hours.  Put ice in a Ziploc bag and cover with a towel and place against area 15 minutes on 15 minutes off.  You may switch to heat after  24 hours.Radiofrequency Lesioning Introduction Radiofrequency lesioning is a procedure that is performed to relieve pain. The procedure is often used for back, neck, or arm pain. Radiofrequency lesioning involves the use of a machine that creates radio waves to make heat. During the procedure, the heat is applied to the nerve that carries the pain signal. The heat damages the nerve and interferes with the pain signal. Pain relief usually starts about 2 weeks after the procedure and lasts for 6 months to 1 year. Tell a health care provider about:  Any allergies you have.  All medicines you are taking, including vitamins, herbs, eye drops, creams, and over-the-counter medicines.  Any problems you or family members have had with anesthetic medicines.  Any blood disorders you have.  Any surgeries you have had.  Any medical conditions you have.  Whether you are pregnant or may be pregnant. What are the risks? Generally, this is a safe procedure. However, problems may occur, including:  Pain or soreness at the injection site.  Infection at the injection site.  Damage to nerves or blood vessels. What happens before the procedure?  Ask your health care provider about:  Changing or stopping your regular medicines. This is especially important if you are taking diabetes medicines or blood thinners.  Taking medicines such as aspirin and ibuprofen. These medicines can thin your blood. Do not take these medicines before your procedure if your health care provider instructs you not to.  Follow instructions from your health care provider about eating or drinking restrictions.  Plan to have someone take you home after the procedure.  If you go home right after the procedure, plan to have someone with you for 24 hours. What happens during the procedure?  You will be given one or more of the following:  A medicine to help you relax (sedative).  A medicine to numb the area (local  anesthetic).  You will be awake during the procedure. You will need to be able to talk with the health care provider during the procedure.  With the help of a type of X-ray (fluoroscopy), the health care provider will insert a radiofrequency needle into the area to be treated.  Next, a wire that carries the radio waves (electrode) will be put through the radiofrequency needle. An electrical pulse will be sent through the electrode to verify the correct nerve. You will feel a tingling sensation, and you may have muscle twitching.  Then, the tissue that is around the needle tip will be heated by an electric current that is passed using the radiofrequency machine. This will numb the nerves.  A bandage (dressing) will be put on the insertion area after the procedure is done. The procedure may vary among health care providers and hospitals. What happens after the procedure?  Your blood pressure, heart rate, breathing rate, and blood oxygen level will be monitored often until the medicines you were given have worn off.  Return to your normal activities as directed by your health care provider. This information is not intended to replace advice given to you by your health care provider. Make sure you discuss any questions you have with your  health care provider. Document Released: 01/13/2011 Document Revised: 10/23/2015 Document Reviewed: 06/24/2014  2017 [employer]

## 2016-06-30 NOTE — Patient Instructions (Signed)
Pain Management Discharge Instructions  General Discharge Instructions :  If you need to reach your doctor call: Monday-Friday 8:00 am - 4:00 pm at [phone] or toll free [phone].  After clinic hours [phone] to have operator reach doctor.  Bring all of your medication bottles to all your appointments in the pain clinic.  To cancel or reschedule your appointment with Pain Management please remember to call 24 hours in advance to avoid a fee.  Refer to the educational materials which you have been given on: General Risks, I had my Procedure. Discharge Instructions, Post Sedation.  Post Procedure Instructions:  The drugs you were given will stay in your system until tomorrow, so for the next 24 hours you should not drive, make any legal decisions or drink any alcoholic beverages.  You may eat anything you prefer, but it is better to start with liquids then soups and crackers, and gradually work up to solid foods.  Please notify your doctor immediately if you have any unusual bleeding, trouble breathing or pain that is not related to your normal pain.  Depending on the type of procedure that was done, some parts of your body may feel week and/or numb.  This usually clears up by tonight or the next day.  Walk with the use of an assistive device or accompanied by an adult for the 24 hours.  You may use ice on the affected area for the first 24 hours.  Put ice in a Ziploc bag and cover with a towel and place against area 15 minutes on 15 minutes off.  You may switch to heat after 24 hours.Radiofrequency Lesioning Introduction Radiofrequency lesioning is a procedure that is performed to relieve pain. The procedure is often used for back, neck, or arm pain. Radiofrequency lesioning involves the use of a machine that creates radio waves to make heat. During the procedure, the heat is applied to the nerve that carries the pain signal. The heat damages the nerve and interferes with the  pain signal. Pain relief usually starts about 2 weeks after the procedure and lasts for 6 months to 1 year. Tell a health care provider about:  Any allergies you have.  All medicines you are taking, including vitamins, herbs, eye drops, creams, and over-the-counter medicines.  Any problems you or family members have had with anesthetic medicines.  Any blood disorders you have.  Any surgeries you have had.  Any medical conditions you have.  Whether you are pregnant or may be pregnant. What are the risks? Generally, this is a safe procedure. However, problems may occur, including:  Pain or soreness at the injection site.  Infection at the injection site.  Damage to nerves or blood vessels. What happens before the procedure?  Ask your health care provider about:  Changing or stopping your regular medicines. This is especially important if you are taking diabetes medicines or blood thinners.  Taking medicines such as aspirin and ibuprofen. These medicines can thin your blood. Do not take these medicines before your procedure if your health care provider instructs you not to.  Follow instructions from your health care provider about eating or drinking restrictions.  Plan to have someone take you home after the procedure.  If you go home right after the procedure, plan to have someone with you for 24 hours. What happens during the procedure?  You will be given one or more of the following:  A medicine to help you relax (sedative).  A medicine to numb the area (  local anesthetic).  You will be awake during the procedure. You will need to be able to talk with the health care provider during the procedure.  With the help of a type of X-ray (fluoroscopy), the health care provider will insert a radiofrequency needle into the area to be treated.  Next, a wire that carries the radio waves (electrode) will be put through the radiofrequency needle. An electrical pulse will be sent  through the electrode to verify the correct nerve. You will feel a tingling sensation, and you may have muscle twitching.  Then, the tissue that is around the needle tip will be heated by an electric current that is passed using the radiofrequency machine. This will numb the nerves.  A bandage (dressing) will be put on the insertion area after the procedure is done. The procedure may vary among health care providers and hospitals. What happens after the procedure?  Your blood pressure, heart rate, breathing rate, and blood oxygen level will be monitored often until the medicines you were given have worn off.  Return to your normal activities as directed by your health care provider. This information is not intended to replace advice given to you by your health care provider. Make sure you discuss any questions you have with your health care provider. Document Released: 01/13/2011 Document Revised: 10/23/2015 Document Reviewed: 06/24/2014  2017 Elsevier

## 2016-07-01 ENCOUNTER — Telehealth: Payer: Self-pay

## 2016-07-01 NOTE — Telephone Encounter (Signed)
Patient states she had a rough night and was sore. Encouraged ice and them heat- Informed to call if needed

## 2016-07-26 ENCOUNTER — Telehealth: Payer: Self-pay | Admitting: Anesthesiology

## 2016-07-26 NOTE — Telephone Encounter (Signed)
General authorization faxed to [facility] for Lyrica 150 mg.

## 2016-07-26 NOTE — Telephone Encounter (Signed)
Patient called stating her Lyrica needs prior authorization before they will fill script.  She has [employer]

## 2016-07-26 NOTE — Telephone Encounter (Signed)
Correction previous note, 75 mg

## 2016-07-26 NOTE — Telephone Encounter (Signed)
Spoke with patient. Will fax over PA for this patient to [facility].

## 2016-07-27 ENCOUNTER — Telehealth: Payer: Self-pay | Admitting: *Deleted

## 2016-07-27 NOTE — Telephone Encounter (Signed)
Phone message left with patient to call re; denial for Lyrica 75 mg.

## 2016-07-28 ENCOUNTER — Telehealth: Payer: Self-pay

## 2016-07-28 NOTE — Telephone Encounter (Signed)
Left voicemail for patient to return call.

## 2016-07-28 NOTE — Telephone Encounter (Signed)
Pt said a nurse called her yesterday saying the insurance did not approve her lyrica. Pt said insurance told her it was approved. Please call pt at [phone]

## 2016-08-09 ENCOUNTER — Encounter: Payer: BLUE CROSS/BLUE SHIELD | Admitting: Pain Medicine

## 2016-08-10 ENCOUNTER — Ambulatory Visit: Payer: BLUE CROSS/BLUE SHIELD | Attending: Pain Medicine | Admitting: Pain Medicine

## 2016-08-10 ENCOUNTER — Encounter: Payer: Self-pay | Admitting: Pain Medicine

## 2016-08-10 VITALS — BP 171/88 | HR 89 | Temp 97.8°F | Resp 16 | Ht 61.0 in | Wt 117.0 lb

## 2016-08-10 DIAGNOSIS — Z87891 Personal history of nicotine dependence: Secondary | ICD-10-CM | POA: Diagnosis not present

## 2016-08-10 DIAGNOSIS — Z885 Allergy status to narcotic agent status: Secondary | ICD-10-CM | POA: Insufficient documentation

## 2016-08-10 DIAGNOSIS — M1288 Other specific arthropathies, not elsewhere classified, other specified site: Secondary | ICD-10-CM | POA: Insufficient documentation

## 2016-08-10 DIAGNOSIS — Z09 Encounter for follow-up examination after completed treatment for conditions other than malignant neoplasm: Secondary | ICD-10-CM | POA: Insufficient documentation

## 2016-08-10 DIAGNOSIS — Z79899 Other long term (current) drug therapy: Secondary | ICD-10-CM | POA: Diagnosis not present

## 2016-08-10 DIAGNOSIS — M792 Neuralgia and neuritis, unspecified: Secondary | ICD-10-CM | POA: Diagnosis not present

## 2016-08-10 DIAGNOSIS — F1190 Opioid use, unspecified, uncomplicated: Secondary | ICD-10-CM | POA: Diagnosis not present

## 2016-08-10 DIAGNOSIS — M47894 Other spondylosis, thoracic region: Secondary | ICD-10-CM

## 2016-08-10 DIAGNOSIS — Z79891 Long term (current) use of opiate analgesic: Secondary | ICD-10-CM | POA: Insufficient documentation

## 2016-08-10 DIAGNOSIS — M546 Pain in thoracic spine: Secondary | ICD-10-CM | POA: Diagnosis not present

## 2016-08-10 DIAGNOSIS — M47814 Spondylosis without myelopathy or radiculopathy, thoracic region: Secondary | ICD-10-CM | POA: Insufficient documentation

## 2016-08-10 DIAGNOSIS — G8929 Other chronic pain: Secondary | ICD-10-CM | POA: Diagnosis not present

## 2016-08-10 DIAGNOSIS — I10 Essential (primary) hypertension: Secondary | ICD-10-CM | POA: Diagnosis not present

## 2016-08-10 MED ORDER — GABAPENTIN 100 MG PO CAPS: 100.0000 mg | ORAL_CAPSULE | Freq: Every day | ORAL | 0 refills | Status: DC

## 2016-08-10 NOTE — Patient Instructions (Signed)
Radiofrequency Lesioning Radiofrequency lesioning is a procedure that is performed to relieve pain. The procedure is often used for back, neck, or arm pain. Radiofrequency lesioning involves the use of a machine that creates radio waves to make heat. During the procedure, the heat is applied to the nerve that carries the pain signal. The heat damages the nerve and interferes with the pain signal. Pain relief usually starts about 2 weeks after the procedure and lasts for 6 months to 1 year. Tell a health care provider about:  Any allergies you have.  All medicines you are taking, including vitamins, herbs, eye drops, creams, and over-the-counter medicines.  Any problems you or family members have had with anesthetic medicines.  Any blood disorders you have.  Any surgeries you have had.  Any medical conditions you have.  Whether you are pregnant or may be pregnant. What are the risks? Generally, this is a safe procedure. However, problems may occur, including:  Pain or soreness at the injection site.  Infection at the injection site.  Damage to nerves or blood vessels. What happens before the procedure?  Ask your health care provider about:  Changing or stopping your regular medicines. This is especially important if you are taking diabetes medicines or blood thinners.  Taking medicines such as aspirin and ibuprofen. These medicines can thin your blood. Do not take these medicines before your procedure if your health care provider instructs you not to.  Follow instructions from your health care provider about eating or drinking restrictions.  Plan to have someone take you home after the procedure.  If you go home right after the procedure, plan to have someone with you for 24 hours. What happens during the procedure?  You will be given one or more of the following:  A medicine to help you relax (sedative).  A medicine to numb the area (local anesthetic).  You will be awake  during the procedure. You will need to be able to talk with the health care provider during the procedure.  With the help of a type of X-ray (fluoroscopy), the health care provider will insert a radiofrequency needle into the area to be treated.  Next, a wire that carries the radio waves (electrode) will be put through the radiofrequency needle. An electrical pulse will be sent through the electrode to verify the correct nerve. You will feel a tingling sensation, and you may have muscle twitching.  Then, the tissue that is around the needle tip will be heated by an electric current that is passed using the radiofrequency machine. This will numb the nerves.  A bandage (dressing) will be put on the insertion area after the procedure is done. The procedure may vary among health care providers and hospitals. What happens after the procedure?  Your blood pressure, heart rate, breathing rate, and blood oxygen level will be monitored often until the medicines you were given have worn off.  Return to your normal activities as directed by your health care provider. This information is not intended to replace advice given to you by your health care provider. Make sure you discuss any questions you have with your health care provider. Document Released: 01/13/2011 Document Revised: 10/23/2015 Document Reviewed: 06/24/2014 Elsevier Interactive Patient Education  2017 [place]  What are the risk, side effects and possible complications? Generally speaking, most procedures are safe.  However, with any procedure there are risks, side effects, and the possibility of complications.  The risks and complications are  dependent upon the sites that are lesioned, or the type of nerve block to be performed.  The closer the procedure is to the spine, the more serious the risks are.  Great care is taken when placing the radio frequency needles, block needles or lesioning probes, but  sometimes complications can occur. 1. Infection: Any time there is an injection through the skin, there is a risk of infection.  This is why sterile conditions are used for these blocks.  There are four possible types of infection. 1. Localized skin infection. 2. Central Nervous System Infection-This can be in the form of Meningitis, which can be deadly. 3. Epidural Infections-This can be in the form of an epidural abscess, which can cause pressure inside of the spine, causing compression of the spinal cord with subsequent paralysis. This would require an emergency surgery to decompress, and there are no guarantees that the patient would recover from the paralysis. 4. Discitis-This is an infection of the intervertebral discs.  It occurs in about 1% of discography procedures.  It is difficult to treat and it may lead to surgery.        2. Pain: the needles have to go through skin and soft tissues, will cause soreness.       3. Damage to internal structures:  The nerves to be lesioned may be near blood vessels or    other nerves which can be potentially damaged.       4. Bleeding: Bleeding is more common if the patient is taking blood thinners such as  aspirin, Coumadin, Ticiid, Plavix, etc., or if he/she have some genetic predisposition  such as hemophilia. Bleeding into the spinal canal can cause compression of the spinal  cord with subsequent paralysis.  This would require an emergency surgery to  decompress and there are no guarantees that the patient would recover from the  paralysis.       5. Pneumothorax:  Puncturing of a lung is a possibility, every time a needle is introduced in  the area of the chest or upper back.  Pneumothorax refers to free air around the  collapsed lung(s), inside of the thoracic cavity (chest cavity).  Another two possible  complications related to a similar event would include: Hemothorax and Chylothorax.   These are variations of the Pneumothorax, where instead of air  around the collapsed  lung(s), you may have blood or chyle, respectively.       6. Spinal headaches: They may occur with any procedures in the area of the spine.       7. Persistent CSF (Cerebro-Spinal Fluid) leakage: This is a rare problem, but may occur  with prolonged intrathecal or epidural catheters either due to the formation of a fistulous  track or a dural tear.       8. Nerve damage: By working so close to the spinal cord, there is always a possibility of  nerve damage, which could be as serious as a permanent spinal cord injury with  paralysis.       9. Death:  Although rare, severe deadly allergic reactions known as "Anaphylactic  reaction" can occur to any of the medications used.      10. Worsening of the symptoms:  We can always make thing worse.  What are the chances of something like this happening? Chances of any of this occuring are extremely low.  By statistics, you have more of a chance of getting killed in a motor vehicle accident: while driving to the  hospital than any of the above occurring .  Nevertheless, you should be aware that they are possibilities.  In general, it is similar to taking a shower.  Everybody knows that you can slip, hit your head and get killed.  Does that mean that you should not shower again?  Nevertheless always keep in mind that statistics do not mean anything if you happen to be on the wrong side of them.  Even if a procedure has a 1 (one) in a 1,000,000 (million) chance of going wrong, it you happen to be that one..Also, keep in mind that by statistics, you have more of a chance of having something go wrong when taking medications.  Who should not have this procedure? If you are on a blood thinning medication (e.g. Coumadin, Plavix, see list of "Blood Thinners"), or if you have an active infection going on, you should not have the procedure.  If you are taking any blood thinners, please inform your physician.  How should I prepare for this  procedure?  Do not eat or drink anything at least six hours prior to the procedure.  Bring a driver with you .  It cannot be a taxi.  Come accompanied by an adult that can drive you back, and that is strong enough to help you if your legs get weak or numb from the local anesthetic.  Take all of your medicines the morning of the procedure with just enough water to swallow them.  If you have diabetes, make sure that you are scheduled to have your procedure done first thing in the morning, whenever possible.  If you have diabetes, take only half of your insulin dose and notify our nurse that you have done so as soon as you arrive at the clinic.  If you are diabetic, but only take blood sugar pills (oral hypoglycemic), then do not take them on the morning of your procedure.  You may take them after you have had the procedure.  Do not take aspirin or any aspirin-containing medications, at least eleven (11) days prior to the procedure.  They may prolong bleeding.  Wear loose fitting clothing that may be easy to take off and that you would not mind if it got stained with Betadine or blood.  Do not wear any jewelry or perfume  Remove any nail coloring.  It will interfere with some of our monitoring equipment.  NOTE: Remember that this is not meant to be interpreted as a complete list of all possible complications.  Unforeseen problems may occur.  BLOOD THINNERS The following drugs contain aspirin or other products, which can cause increased bleeding during surgery and should not be taken for 2 weeks prior to and 1 week after surgery.  If you should need take something for relief of minor pain, you may take acetaminophen which is found in Tylenol,m Datril, Anacin-3 and Panadol. It is not blood thinner. The products listed below are.  Do not take any of the products listed below in addition to any listed on your instruction sheet.  A.P.C or A.P.C with Codeine Codeine Phosphate Capsules #3  Ibuprofen Ridaura  ABC compound Congesprin Imuran rimadil  Advil Cope Indocin Robaxisal  Alka-Seltzer Effervescent Pain Reliever and Antacid Coricidin or Coricidin-D  Indomethacin Rufen  Alka-Seltzer plus Cold Medicine Cosprin Ketoprofen S-A-C Tablets  Anacin Analgesic Tablets or Capsules Coumadin Korlgesic Salflex  Anacin Extra Strength Analgesic tablets or capsules CP-2 Tablets Lanoril Salicylate  Anaprox Cuprimine Capsules Levenox Salocol  Anexsia-D Dalteparin Magan  Salsalate  Anodynos Darvon compound Magnesium Salicylate Sine-off  Ansaid Dasin Capsules Magsal Sodium Salicylate  Anturane Depen Capsules Marnal Soma  APF Arthritis pain formula Dewitt's Pills Measurin Stanback  Argesic Dia-Gesic Meclofenamic Sulfinpyrazone  Arthritis Bayer Timed Release Aspirin Diclofenac Meclomen Sulindac  Arthritis pain formula Anacin Dicumarol Medipren Supac  Analgesic (Safety coated) Arthralgen Diffunasal Mefanamic Suprofen  Arthritis Strength Bufferin Dihydrocodeine Mepro Compound Suprol  Arthropan liquid Dopirydamole Methcarbomol with Aspirin Synalgos  ASA tablets/Enseals Disalcid Micrainin Tagament  Ascriptin Doan's Midol Talwin  Ascriptin A/D Dolene Mobidin Tanderil  Ascriptin Extra Strength Dolobid Moblgesic Ticlid  Ascriptin with Codeine Doloprin or Doloprin with Codeine Momentum Tolectin  Asperbuf Duoprin Mono-gesic Trendar  Aspergum Duradyne Motrin or Motrin IB Triminicin  Aspirin plain, buffered or enteric coated Durasal Myochrisine Trigesic  Aspirin Suppositories Easprin Nalfon Trillsate  Aspirin with Codeine Ecotrin Regular or Extra Strength Naprosyn Uracel  Atromid-S Efficin Naproxen Ursinus  Auranofin Capsules Elmiron Neocylate Vanquish  Axotal Emagrin Norgesic Verin  Azathioprine Empirin or Empirin with Codeine Normiflo Vitamin E  Azolid Emprazil Nuprin Voltaren  Bayer Aspirin plain, buffered or children's or timed BC Tablets or powders Encaprin Orgaran Warfarin Sodium   Buff-a-Comp Enoxaparin Orudis Zorpin  Buff-a-Comp with Codeine Equegesic Os-Cal-Gesic   Buffaprin Excedrin plain, buffered or Extra Strength Oxalid   Bufferin Arthritis Strength Feldene Oxphenbutazone   Bufferin plain or Extra Strength Feldene Capsules Oxycodone with Aspirin   Bufferin with Codeine Fenoprofen Fenoprofen Pabalate or Pabalate-SF   Buffets II Flogesic Panagesic   Buffinol plain or Extra Strength Florinal or Florinal with Codeine Panwarfarin   Buf-Tabs Flurbiprofen Penicillamine   Butalbital Compound Four-way cold tablets Penicillin   Butazolidin Fragmin Pepto-Bismol   Carbenicillin Geminisyn Percodan   Carna Arthritis Reliever Geopen Persantine   Carprofen Gold's salt Persistin   Chloramphenicol Goody's Phenylbutazone   Chloromycetin Haltrain Piroxlcam   Clmetidine heparin Plaquenil   Cllnoril Hyco-pap Ponstel   Clofibrate Hydroxy chloroquine Propoxyphen         Before stopping any of these medications, be sure to consult the physician who ordered them.  Some, such as Coumadin (Warfarin) are ordered to prevent or treat serious conditions such as "deep thrombosis", "pumonary embolisms", and other heart problems.  The amount of time that you may need off of the medication may also vary with the medication and the reason for which you were taking it.  If you are taking any of these medications, please make sure you notify your pain physician before you undergo any procedures.         Pain Management Discharge Instructions  General Discharge Instructions :  If you need to reach your doctor call: Monday-Friday 8:00 am - 4:00 pm at [phone] or toll free [phone].  After clinic hours [phone] to have operator reach doctor.  Bring all of your medication bottles to all your appointments in the pain clinic.  To cancel or reschedule your appointment with Pain Management please remember to call 24 hours in advance to avoid a fee.  Refer to the educational  materials which you have been given on: General Risks, I had my Procedure. Discharge Instructions, Post Sedation.  Post Procedure Instructions:  The drugs you were given will stay in your system until tomorrow, so for the next 24 hours you should not drive, make any legal decisions or drink any alcoholic beverages.  You may eat anything you prefer, but it is better to start with liquids then soups and crackers, and gradually work up to solid foods.  Please  notify your doctor immediately if you have any unusual bleeding, trouble breathing or pain that is not related to your normal pain.  Depending on the type of procedure that was done, some parts of your body may feel week and/or numb.  This usually clears up by tonight or the next day.  Walk with the use of an assistive device or accompanied by an adult for the 24 hours.  You may use ice on the affected area for the first 24 hours.  Put ice in a Ziploc bag and cover with a towel and place against area 15 minutes on 15 minutes off.  You may switch to heat after 24 hours.

## 2016-08-10 NOTE — Progress Notes (Signed)
Patient states she has been out of Tramadol x 3 weeks.  When asked if she took more medication than prescribed or if appt was made late, she stated she didn't know why she was out and why the pharmacy had not called her about filling.  Our records indicate that MR date is 09/28/16.  Spoke with pharmacy and they report that she has 2 refills remaining with last fill date of 06/30/16.

## 2016-08-10 NOTE — Progress Notes (Signed)
Nursing Pain Medication Assessment:  Safety precautions to be maintained throughout the outpatient stay will include: orient to surroundings, keep bed in low position, maintain call bell within reach at all times, provide assistance with transfer out of bed and ambulation.  Medication Inspection Compliance: Deanna Jacobson did not comply with our request to bring her pills to be counted. She was reminded that bringing the medication bottles, even when empty, is a requirement. Pill/Patch Count: None available to be counted. Bottle Appearance: No container available. Did not bring bottle(s) to appointment. Medication: None brought in. Filled Date: N/A Last Medication intake:  Ran out of medicine more than 48 hours ago

## 2016-08-10 NOTE — Progress Notes (Addendum)
Patient's Name: Deanna Jacobson  MRN: [id number]  Referring Provider: Romeo: 13-Oct-1955  PCP: [place]  DOS: 08/10/2016  Note by: Kathlen Brunswick. Dossie Arbour, MD  Service setting: Ambulatory outpatient  Specialty: Interventional Pain Management  Location: [facility] (AMB) Pain Management Facility    Patient type: Established   Primary Reason(s) for Visit: Encounter for post-procedure evaluation of chronic illness with mild to moderate exacerbation CC: Back Pain (lower middle)  HPI  Deanna Jacobson is a 61 y.o. year old, female patient, who comes today for a post-procedure evaluation. She has Acute renal failure (ARF) ([place]); Long term current use of opiate analgesic; Long term prescription opiate use; Opiate use; Encounter for therapeutic drug level monitoring; Encounter for pain management planning; Chronic mid back pain; Chronic thoracic spine pain (midline); Thoracic facet syndrome (Bilateral); Lumbar facet arthropathy ([place]); Musculoskeletal pain; Neurogenic pain; Osteoarthritis; Chronic pain syndrome; and Thoracic spondylosis on her problem list. Her primarily concern today is the Back Pain (lower middle)  Pain Assessment: Self-Reported Pain Score: 3 /10             Reported level is compatible with observation.       Pain Type: Chronic pain Pain Location: Back Pain Orientation: Lower, Mid Pain Descriptors / Indicators: Discomfort, Nagging, Constant Pain Frequency: Constant  Deanna Jacobson comes in today for post-procedure evaluation after the treatment done on 06/30/2016.  Further details on both, my assessment(s), as well as the proposed treatment plan, please see below.  Post-Procedure Assessment  06/30/2016 Procedure: Left T12, L1, L2, & L3Medial Branch Level(s) radiofrequency ablation, under fluoroscopic guidance and IV sedation Post-procedure pain score: 0/10 (100% relief) Influential Factors: BMI: 22.11 kg/m Intra-procedural challenges:  None observed Assessment challenges: None detected         Post-procedural side-effects, adverse reactions, or complications: None reported Reported issues: None  Sedation: Please see nurses note. When no sedatives are used, the analgesic levels obtained are directly associated to the effectiveness of the local anesthetics. However, when sedation is provided, the level of analgesia obtained during the initial 1 hour following the intervention, is believed to be the result of a combination of factors. These factors may include, but are not limited to: 1. The effectiveness of the local anesthetics used. 2. The effects of the analgesic(s) and/or anxiolytic(s) used. 3. The degree of discomfort experienced by the patient at the time of the procedure. 4. The patients ability and reliability in recalling and recording the events. 5. The presence and influence of possible secondary gains and/or psychosocial factors. Reported result: Relief experienced during the 1st hour after the procedure: 100 % (Ultra-Short Term Relief) Interpretative annotation: Analgesia during this period is likely to be Local Anesthetic and/or IV Sedative (Analgesic/Anxiolitic) related.          Effects of local anesthetic: The analgesic effects attained during this period are directly associated to the localized infiltration of local anesthetics and therefore cary significant diagnostic value as to the etiological location, or anatomical origin, of the pain. Expected duration of relief is directly dependent on the pharmacodynamics of the local anesthetic used. Long-acting (4-6 hours) anesthetics used.  Reported result: Relief during the next 4 to 6 hour after the procedure: 100 % (Short-Term Relief) Interpretative annotation: Complete relief would suggest area to be the source of the pain.          Long-term benefit: Defined as the period of time past the expected duration of local anesthetics. With the possible exception of  prolonged sympathetic blockade from the local anesthetics, benefits during this period are typically attributed to, or associated with, other factors such as analgesic sensory neuropraxia, antiinflammatory effects, or beneficial biochemical changes provided by agents other than the local anesthetics Reported result: Extended relief following procedure: 50 % (pain relief lasted a couple of days and then soreness began and then spine began to hurt.   stinging and burning are relieved. ) (Long-Term Relief) Interpretative annotation: Good relief. This could suggest inflammation to be a significant component in the etiology to the pain.          Current benefits: Defined as persistent relief that continues at this point in time.   Reported results: Treated area: 50 % Deanna Jacobson reports improvement in function Interpretative annotation: Long-term benefit would suggest adequate RF ablation  Interpretation: Results would suggest therapy to have a positive impact on the patient's condition.          Laboratory Chemistry  Inflammation Markers Lab Results  Component Value Date   CRP 0.5 12/05/2015   ESRSEDRATE 25 12/05/2015   (CRP: Acute Phase) (ESR: Chronic Phase) Renal Function Markers Lab Results  Component Value Date   BUN 13 02/01/2016   CREATININE 1.12 (H) 02/01/2016   GFRAA >60 02/01/2016   GFRNONAA 52 (L) 02/01/2016   Hepatic Function Markers Lab Results  Component Value Date   AST 16 10/28/2015   ALT 11 (L) 10/28/2015   ALBUMIN 3.7 10/28/2015   ALKPHOS 51 10/28/2015   Electrolytes Lab Results  Component Value Date   NA 137 02/01/2016   K 3.6 02/01/2016   CL 107 02/01/2016   CALCIUM 8.1 (L) 02/01/2016   MG 1.8 12/05/2015   Neuropathy Markers Lab Results  Component Value Date   VITAMINB12 354 12/05/2015   Bone Pathology Markers Lab Results  Component Value Date   ALKPHOS 51 10/28/2015   25OHVITD1 59 12/05/2015   25OHVITD2 <1.0 12/05/2015   25OHVITD3 59 12/05/2015    CALCIUM 8.1 (L) 02/01/2016   Coagulation Parameters Lab Results  Component Value Date   PLT 196 02/01/2016   Cardiovascular Markers Lab Results  Component Value Date   HGB 11.0 (L) 02/01/2016   HCT 30.7 (L) 02/01/2016   Note: Lab results reviewed.  Recent Diagnostic Imaging Review  Dg C-arm 1-60 Min-no Report  Result Date: 06/30/2016 Fluoroscopy was utilized by the requesting physician.  No radiographic interpretation.   Note: Imaging results reviewed.          Meds  The patient has a current medication list which includes the following prescription(s): cyclobenzaprine, lisinopril, meloxicam, tramadol, trazodone, and gabapentin.  Current Outpatient Prescriptions on File Prior to Visit  Medication Sig  . cyclobenzaprine (FLEXERIL) 10 MG tablet Take 1 tablet (10 mg total) by mouth every 8 (eight) hours as needed for muscle spasms.  Marland Kitchen lisinopril (PRINIVIL,ZESTRIL) 20 MG tablet Take 1 tablet (20 mg total) by mouth daily.  . meloxicam (MOBIC) 15 MG tablet Take 1 tablet (15 mg total) by mouth daily.  . traMADol (ULTRAM) 50 MG tablet Take 1-2 tablets (50-100 mg total) by mouth every 6 (six) hours as needed for severe pain.  . traZODone (DESYREL) 100 MG tablet Take 100 mg by mouth at bedtime.    No current facility-administered medications on file prior to visit.    ROS  Constitutional: Denies any fever or chills Gastrointestinal: No reported hemesis, hematochezia, vomiting, or acute GI distress Musculoskeletal: Denies any acute onset joint swelling, redness, loss of ROM, or weakness Neurological:  No reported episodes of acute onset apraxia, aphasia, dysarthria, agnosia, amnesia, paralysis, loss of coordination, or loss of consciousness  Allergies  Ms. Corrales is allergic to codeine.  [place]  Drug: Ms. Steffy  reports that she uses drugs. Alcohol:  reports that she drinks alcohol. Tobacco:  reports that she has quit smoking. Her smoking use included Cigarettes. She  smoked 1.00 pack per day. She has never used smokeless tobacco. Medical:  has a past medical history of Chronic back pain and Hypertension. Family: family history includes Cancer in her mother; Hypertension in her father.  Past Surgical History:  Procedure Laterality Date  . BREAST ENHANCEMENT SURGERY     Constitutional Exam  General appearance: Well nourished, well developed, and well hydrated. In no apparent acute distress Vitals:   08/10/16 1111  BP: (!) 171/88  Pulse: 89  Resp: 16  Temp: 97.8 F (36.6 C)  TempSrc: Oral  SpO2: 100%  Weight: 117 lb (53.1 kg)  Height: 5' 1" (1.549 m)   BMI Assessment: Estimated body mass index is 22.11 kg/m as calculated from the following:   Height as of this encounter: 5' 1" (1.549 m).   Weight as of this encounter: 117 lb (53.1 kg).  BMI interpretation table: BMI level Category Range association with higher incidence of chronic pain  <18 kg/m2 Underweight   18.5-24.9 kg/m2 Ideal body weight   25-29.9 kg/m2 Overweight Increased incidence by 20%  30-34.9 kg/m2 Obese (Class I) Increased incidence by 68%  35-39.9 kg/m2 Severe obesity (Class II) Increased incidence by 136%  >40 kg/m2 Extreme obesity (Class III) Increased incidence by 254%   BMI Readings from Last 4 Encounters:  08/10/16 22.11 kg/m  06/30/16 22.11 kg/m  04/01/16 22.11 kg/m  02/24/16 22.11 kg/m   Wt Readings from Last 4 Encounters:  08/10/16 117 lb (53.1 kg)  06/30/16 117 lb (53.1 kg)  04/01/16 117 lb (53.1 kg)  02/24/16 117 lb (53.1 kg)  Psych/Mental status: Alert, oriented x 3 (person, place, & time)       Eyes: PERLA Respiratory: No evidence of acute respiratory distress  Cervical Spine Exam  Inspection: No masses, redness, or swelling Alignment: Symmetrical Functional ROM: Unrestricted ROM Stability: No instability detected Muscle strength & Tone: Functionally intact Sensory: Unimpaired Palpation: Non-contributory  Upper Extremity (UE) Exam    Side:  Right upper extremity  Side: Left upper extremity  Inspection: No masses, redness, swelling, or asymmetry. No contractures  Inspection: No masses, redness, swelling, or asymmetry. No contractures  Functional ROM: Unrestricted ROM          Functional ROM: Unrestricted ROM          Muscle strength & Tone: Functionally intact  Muscle strength & Tone: Functionally intact  Sensory: Unimpaired  Sensory: Unimpaired  Palpation: Euthermic  Palpation: Euthermic  Specialized Test(s): Deferred         Specialized Test(s): Deferred          Thoracic Spine Exam  Inspection: No masses, redness, or swelling Alignment: Symmetrical Functional ROM: Unrestricted ROM Stability: No instability detected Sensory: Unimpaired Muscle strength & Tone: Functionally intact Palpation: Non-contributory  Lumbar Spine Exam  Inspection: No masses, redness, or swelling Alignment: Symmetrical Functional ROM: Unrestricted ROM Stability: No instability detected Muscle strength & Tone: Functionally intact Sensory: Unimpaired Palpation: Non-contributory Provocative Tests: Lumbar Hyperextension and rotation test: evaluation deferred today       Patrick's Maneuver: evaluation deferred today              Gait &  Posture Assessment  Ambulation: Unassisted Gait: Relatively normal for age and body habitus Posture: WNL   Lower Extremity Exam    Side: Right lower extremity  Side: Left lower extremity  Inspection: No masses, redness, swelling, or asymmetry. No contractures  Inspection: No masses, redness, swelling, or asymmetry. No contractures  Functional ROM: Unrestricted ROM          Functional ROM: Unrestricted ROM          Muscle strength & Tone: Functionally intact  Muscle strength & Tone: Functionally intact  Sensory: Unimpaired  Sensory: Unimpaired  Palpation: No palpable anomalies  Palpation: No palpable anomalies   Assessment  Primary Diagnosis & Pertinent Problem List: The primary encounter diagnosis was  Thoracic facet syndrome. Diagnoses of Chronic thoracic spine pain (midline), Thoracic spondylosis, Long term prescription opiate use, Opiate use, and Neurogenic pain were also pertinent to this visit.  Status Diagnosis  Controlled Controlled Controlled 1. Thoracic facet syndrome   2. Chronic thoracic spine pain (midline)   3. Thoracic spondylosis   4. Long term prescription opiate use   5. Opiate use   6. Neurogenic pain      Plan of Care  Pharmacotherapy (Medications Ordered): Meds ordered this encounter  Medications  . gabapentin (NEURONTIN) 100 MG capsule    Sig: Take 1 capsule (100 mg total) by mouth at bedtime.    Dispense:  30 capsule    Refill:  0    Do not place this medication, or any other prescription from our practice, on "Automatic Refill". Patient may have prescription filled one day early if pharmacy is closed on scheduled refill date.   New Prescriptions   GABAPENTIN (NEURONTIN) 100 MG CAPSULE    Take 1 capsule (100 mg total) by mouth at bedtime.   Medications administered today: Ms. Borbon had no medications administered during this visit. Lab-work, procedure(s), and/or referral(s): Orders Placed This Encounter  Procedures  . Radiofrequency,Thoracic  . ToxASSURE Select 13 (MW), Urine   Imaging and/or referral(s): None  Interventional therapies: Planned, scheduled, and/or pending:   Right T12, L1, L2, & L3Medial Branch Level(s) RFA   Considering:   Palliative Left T12, L1, L2, & L3Medial Branch Level(s) RFA (Last done on 06/30/16)   Palliative PRN treatment(s):   Palliative T12, L1, L2, & L3Medial Branch Level(s) Block Palliative T12, L1, L2, & L3Medial Branch Level(s) radiofrequency ablation    Provider-requested follow-up: Return for procedure (ASAP).  Future Appointments Date Time Provider Wink  09/27/2016 10:45 AM Milinda Pointer, MD [facility] None   Primary Care Physician: [place] Location:  [facility] Note by: Kathlen Brunswick. Dossie Arbour, M.D, DABA, DABAPM, DABPM, DABIPP, FIPP Date: 08/10/2016; Time: 7:12 PM  Pain Score Disclaimer: We use the NRS-11 scale. This is a self-reported, subjective measurement of pain severity with only modest accuracy. It is used primarily to identify changes within a particular patient. It must be understood that outpatient pain scales are significantly less accurate that those used for research, where they can be applied under ideal controlled circumstances with minimal exposure to variables. In reality, the score is likely to be a combination of pain intensity and pain affect, where pain affect describes the degree of emotional arousal or changes in action readiness caused by the sensory experience of pain. Factors such as social and work situation, setting, emotional state, anxiety levels, expectation, and prior pain experience may influence pain perception and show large inter-individual differences that may also be affected by time  variables.  Patient instructions provided during this appointment: Patient Instructions   Radiofrequency Lesioning Radiofrequency lesioning is a procedure that is performed to relieve pain. The procedure is often used for back, neck, or arm pain. Radiofrequency lesioning involves the use of a machine that creates radio waves to make heat. During the procedure, the heat is applied to the nerve that carries the pain signal. The heat damages the nerve and interferes with the pain signal. Pain relief usually starts about 2 weeks after the procedure and lasts for 6 months to 1 year. Tell a health care provider about:  Any allergies you have.  All medicines you are taking, including vitamins, herbs, eye drops, creams, and over-the-counter medicines.  Any problems you or family members have had with anesthetic medicines.  Any blood disorders you have.  Any surgeries you have had.  Any medical conditions  you have.  Whether you are pregnant or may be pregnant. What are the risks? Generally, this is a safe procedure. However, problems may occur, including:  Pain or soreness at the injection site.  Infection at the injection site.  Damage to nerves or blood vessels. What happens before the procedure?  Ask your health care provider about:  Changing or stopping your regular medicines. This is especially important if you are taking diabetes medicines or blood thinners.  Taking medicines such as aspirin and ibuprofen. These medicines can thin your blood. Do not take these medicines before your procedure if your health care provider instructs you not to.  Follow instructions from your health care provider about eating or drinking restrictions.  Plan to have someone take you home after the procedure.  If you go home right after the procedure, plan to have someone with you for 24 hours. What happens during the procedure?  You will be given one or more of the following:  A medicine to help you relax (sedative).  A medicine to numb the area (local anesthetic).  You will be awake during the procedure. You will need to be able to talk with the health care provider during the procedure.  With the help of a type of X-ray (fluoroscopy), the health care provider will insert a radiofrequency needle into the area to be treated.  Next, a wire that carries the radio waves (electrode) will be put through the radiofrequency needle. An electrical pulse will be sent through the electrode to verify the correct nerve. You will feel a tingling sensation, and you may have muscle twitching.  Then, the tissue that is around the needle tip will be heated by an electric current that is passed using the radiofrequency machine. This will numb the nerves.  A bandage (dressing) will be put on the insertion area after the procedure is done. The procedure may vary among health care providers and hospitals. What  happens after the procedure?  Your blood pressure, heart rate, breathing rate, and blood oxygen level will be monitored often until the medicines you were given have worn off.  Return to your normal activities as directed by your health care provider. This information is not intended to replace advice given to you by your health care provider. Make sure you discuss any questions you have with your health care provider. Document Released: 01/13/2011 Document Revised: 10/23/2015 Document Reviewed: 06/24/2014 Elsevier Interactive Patient Education  2017 [place]  What are the risk, side effects and possible complications? Generally speaking, most procedures are safe.  However, with any procedure there are risks,  side effects, and the possibility of complications.  The risks and complications are dependent upon the sites that are lesioned, or the type of nerve block to be performed.  The closer the procedure is to the spine, the more serious the risks are.  Great care is taken when placing the radio frequency needles, block needles or lesioning probes, but sometimes complications can occur. 1. Infection: Any time there is an injection through the skin, there is a risk of infection.  This is why sterile conditions are used for these blocks.  There are four possible types of infection. 1. Localized skin infection. 2. Central Nervous System Infection-This can be in the form of Meningitis, which can be deadly. 3. Epidural Infections-This can be in the form of an epidural abscess, which can cause pressure inside of the spine, causing compression of the spinal cord with subsequent paralysis. This would require an emergency surgery to decompress, and there are no guarantees that the patient would recover from the paralysis. 4. Discitis-This is an infection of the intervertebral discs.  It occurs in about 1% of discography procedures.  It is difficult to treat and it may  lead to surgery.        2. Pain: the needles have to go through skin and soft tissues, will cause soreness.       3. Damage to internal structures:  The nerves to be lesioned may be near blood vessels or    other nerves which can be potentially damaged.       4. Bleeding: Bleeding is more common if the patient is taking blood thinners such as  aspirin, Coumadin, Ticiid, Plavix, etc., or if he/she have some genetic predisposition  such as hemophilia. Bleeding into the spinal canal can cause compression of the spinal  cord with subsequent paralysis.  This would require an emergency surgery to  decompress and there are no guarantees that the patient would recover from the  paralysis.       5. Pneumothorax:  Puncturing of a lung is a possibility, every time a needle is introduced in  the area of the chest or upper back.  Pneumothorax refers to free air around the  collapsed lung(s), inside of the thoracic cavity (chest cavity).  Another two possible  complications related to a similar event would include: Hemothorax and Chylothorax.   These are variations of the Pneumothorax, where instead of air around the collapsed  lung(s), you may have blood or chyle, respectively.       6. Spinal headaches: They may occur with any procedures in the area of the spine.       7. Persistent CSF (Cerebro-Spinal Fluid) leakage: This is a rare problem, but may occur  with prolonged intrathecal or epidural catheters either due to the formation of a fistulous  track or a dural tear.       8. Nerve damage: By working so close to the spinal cord, there is always a possibility of  nerve damage, which could be as serious as a permanent spinal cord injury with  paralysis.       9. Death:  Although rare, severe deadly allergic reactions known as "Anaphylactic  reaction" can occur to any of the medications used.      10. Worsening of the symptoms:  We can always make thing worse.  What are the chances of something like this  happening? Chances of any of this occuring are extremely low.  By statistics, you have more of a  chance of getting killed in a motor vehicle accident: while driving to the hospital than any of the above occurring .  Nevertheless, you should be aware that they are possibilities.  In general, it is similar to taking a shower.  Everybody knows that you can slip, hit your head and get killed.  Does that mean that you should not shower again?  Nevertheless always keep in mind that statistics do not mean anything if you happen to be on the wrong side of them.  Even if a procedure has a 1 (one) in a 1,000,000 (million) chance of going wrong, it you happen to be that one..Also, keep in mind that by statistics, you have more of a chance of having something go wrong when taking medications.  Who should not have this procedure? If you are on a blood thinning medication (e.g. Coumadin, Plavix, see list of "Blood Thinners"), or if you have an active infection going on, you should not have the procedure.  If you are taking any blood thinners, please inform your physician.  How should I prepare for this procedure?  Do not eat or drink anything at least six hours prior to the procedure.  Bring a driver with you .  It cannot be a taxi.  Come accompanied by an adult that can drive you back, and that is strong enough to help you if your legs get weak or numb from the local anesthetic.  Take all of your medicines the morning of the procedure with just enough water to swallow them.  If you have diabetes, make sure that you are scheduled to have your procedure done first thing in the morning, whenever possible.  If you have diabetes, take only half of your insulin dose and notify our nurse that you have done so as soon as you arrive at the clinic.  If you are diabetic, but only take blood sugar pills (oral hypoglycemic), then do not take them on the morning of your procedure.  You may take them after you have had the  procedure.  Do not take aspirin or any aspirin-containing medications, at least eleven (11) days prior to the procedure.  They may prolong bleeding.  Wear loose fitting clothing that may be easy to take off and that you would not mind if it got stained with Betadine or blood.  Do not wear any jewelry or perfume  Remove any nail coloring.  It will interfere with some of our monitoring equipment.  NOTE: Remember that this is not meant to be interpreted as a complete list of all possible complications.  Unforeseen problems may occur.  BLOOD THINNERS The following drugs contain aspirin or other products, which can cause increased bleeding during surgery and should not be taken for 2 weeks prior to and 1 week after surgery.  If you should need take something for relief of minor pain, you may take acetaminophen which is found in Tylenol,m Datril, Anacin-3 and Panadol. It is not blood thinner. The products listed below are.  Do not take any of the products listed below in addition to any listed on your instruction sheet.  A.P.C or A.P.C with Codeine Codeine Phosphate Capsules #3 Ibuprofen Ridaura  ABC compound Congesprin Imuran rimadil  Advil Cope Indocin Robaxisal  Alka-Seltzer Effervescent Pain Reliever and Antacid Coricidin or Coricidin-D  Indomethacin Rufen  Alka-Seltzer plus Cold Medicine Cosprin Ketoprofen S-A-C Tablets  Anacin Analgesic Tablets or Capsules Coumadin Korlgesic Salflex  Anacin Extra Strength Analgesic tablets or capsules CP-2 Tablets  Lanoril Salicylate  Anaprox Cuprimine Capsules Levenox Salocol  Anexsia-D Dalteparin Magan Salsalate  Anodynos Darvon compound Magnesium Salicylate Sine-off  Ansaid Dasin Capsules Magsal Sodium Salicylate  Anturane Depen Capsules Marnal Soma  APF Arthritis pain formula Dewitt's Pills Measurin Stanback  Argesic Dia-Gesic Meclofenamic Sulfinpyrazone  Arthritis Bayer Timed Release Aspirin Diclofenac Meclomen Sulindac  Arthritis pain formula  Anacin Dicumarol Medipren Supac  Analgesic (Safety coated) Arthralgen Diffunasal Mefanamic Suprofen  Arthritis Strength Bufferin Dihydrocodeine Mepro Compound Suprol  Arthropan liquid Dopirydamole Methcarbomol with Aspirin Synalgos  ASA tablets/Enseals Disalcid Micrainin Tagament  Ascriptin Doan's Midol Talwin  Ascriptin A/D Dolene Mobidin Tanderil  Ascriptin Extra Strength Dolobid Moblgesic Ticlid  Ascriptin with Codeine Doloprin or Doloprin with Codeine Momentum Tolectin  Asperbuf Duoprin Mono-gesic Trendar  Aspergum Duradyne Motrin or Motrin IB Triminicin  Aspirin plain, buffered or enteric coated Durasal Myochrisine Trigesic  Aspirin Suppositories Easprin Nalfon Trillsate  Aspirin with Codeine Ecotrin Regular or Extra Strength Naprosyn Uracel  Atromid-S Efficin Naproxen Ursinus  Auranofin Capsules Elmiron Neocylate Vanquish  Axotal Emagrin Norgesic Verin  Azathioprine Empirin or Empirin with Codeine Normiflo Vitamin E  Azolid Emprazil Nuprin Voltaren  Bayer Aspirin plain, buffered or children's or timed BC Tablets or powders Encaprin Orgaran Warfarin Sodium  Buff-a-Comp Enoxaparin Orudis Zorpin  Buff-a-Comp with Codeine Equegesic Os-Cal-Gesic   Buffaprin Excedrin plain, buffered or Extra Strength Oxalid   Bufferin Arthritis Strength Feldene Oxphenbutazone   Bufferin plain or Extra Strength Feldene Capsules Oxycodone with Aspirin   Bufferin with Codeine Fenoprofen Fenoprofen Pabalate or Pabalate-SF   Buffets II Flogesic Panagesic   Buffinol plain or Extra Strength Florinal or Florinal with Codeine Panwarfarin   Buf-Tabs Flurbiprofen Penicillamine   Butalbital Compound Four-way cold tablets Penicillin   Butazolidin Fragmin Pepto-Bismol   Carbenicillin Geminisyn Percodan   Carna Arthritis Reliever Geopen Persantine   Carprofen Gold's salt Persistin   Chloramphenicol Goody's Phenylbutazone   Chloromycetin Haltrain Piroxlcam   Clmetidine heparin Plaquenil   Cllnoril Hyco-pap  Ponstel   Clofibrate Hydroxy chloroquine Propoxyphen         Before stopping any of these medications, be sure to consult the physician who ordered them.  Some, such as Coumadin (Warfarin) are ordered to prevent or treat serious conditions such as "deep thrombosis", "pumonary embolisms", and other heart problems.  The amount of time that you may need off of the medication may also vary with the medication and the reason for which you were taking it.  If you are taking any of these medications, please make sure you notify your pain physician before you undergo any procedures.         Pain Management Discharge Instructions  General Discharge Instructions :  If you need to reach your doctor call: Monday-Friday 8:00 am - 4:00 pm at [phone] or toll free [phone].  After clinic hours [phone] to have operator reach doctor.  Bring all of your medication bottles to all your appointments in the pain clinic.  To cancel or reschedule your appointment with Pain Management please remember to call 24 hours in advance to avoid a fee.  Refer to the educational materials which you have been given on: General Risks, I had my Procedure. Discharge Instructions, Post Sedation.  Post Procedure Instructions:  The drugs you were given will stay in your system until tomorrow, so for the next 24 hours you should not drive, make any legal decisions or drink any alcoholic beverages.  You may eat anything you prefer, but it is better to start with liquids then  soups and crackers, and gradually work up to solid foods.  Please notify your doctor immediately if you have any unusual bleeding, trouble breathing or pain that is not related to your normal pain.  Depending on the type of procedure that was done, some parts of your body may feel week and/or numb.  This usually clears up by tonight or the next day.  Walk with the use of an assistive device or accompanied by an adult for the 24  hours.  You may use ice on the affected area for the first 24 hours.  Put ice in a Ziploc bag and cover with a towel and place against area 15 minutes on 15 minutes off.  You may switch to heat after 24 hours.

## 2016-08-13 LAB — TOXASSURE SELECT 13 (MW), URINE

## 2016-09-06 ENCOUNTER — Other Ambulatory Visit: Payer: Self-pay | Admitting: Pain Medicine

## 2016-09-06 DIAGNOSIS — M792 Neuralgia and neuritis, unspecified: Secondary | ICD-10-CM

## 2016-09-07 ENCOUNTER — Encounter: Payer: Self-pay | Admitting: Pain Medicine

## 2016-09-14 NOTE — Progress Notes (Signed)
NOTE: This forensic urine drug screen (UDS) test was conducted using a state-of-the-art ultra high performance liquid chromatography and mass spectrometry system (UPLC/MS-MS), the most sophisticated and accurate method available. UPLC/MS-MS is 1,000 times more precise and accurate than standard gas chromatography and mass spectrometry (GC/MS). This system can analyze 26 drug categories and 180 drug compounds.  Positive, undisclosed use of illicit substance (Cannabinoids). Our program has a "Zero Tolerance" for the use of illicit substances. As a consequence of the results obtained on this test, we will no longer offer controlled substances as a therapeutic option for this patient, until the UDS return clear. In addition, we will check to see if this is a recurrent event. Today the patient will be given a final warning and informed that should this event happen again, we will no longer continue to offer opioids as an alternative treatment option. In the event that our records reveal that this warning had previously been given to the patient, we will then immediately discontinue this mode of therapy.

## 2016-09-27 ENCOUNTER — Ambulatory Visit (HOSPITAL_BASED_OUTPATIENT_CLINIC_OR_DEPARTMENT_OTHER): Payer: BLUE CROSS/BLUE SHIELD | Admitting: Pain Medicine

## 2016-09-27 ENCOUNTER — Ambulatory Visit
Admission: RE | Admit: 2016-09-27 | Discharge: 2016-09-27 | Disposition: A | Discharge status: Home / Self Care | Payer: BLUE CROSS/BLUE SHIELD | Source: Ambulatory Visit | Attending: Pain Medicine | Admitting: Pain Medicine

## 2016-09-27 ENCOUNTER — Encounter: Payer: Self-pay | Admitting: Pain Medicine

## 2016-09-27 VITALS — BP 163/83 | HR 80 | Temp 96.6°F | Resp 14 | Ht 61.0 in | Wt 102.0 lb

## 2016-09-27 DIAGNOSIS — M792 Neuralgia and neuritis, unspecified: Secondary | ICD-10-CM

## 2016-09-27 DIAGNOSIS — M159 Polyosteoarthritis, unspecified: Secondary | ICD-10-CM

## 2016-09-27 DIAGNOSIS — M4696 Unspecified inflammatory spondylopathy, lumbar region: Secondary | ICD-10-CM | POA: Insufficient documentation

## 2016-09-27 DIAGNOSIS — M1991 Primary osteoarthritis, unspecified site: Secondary | ICD-10-CM | POA: Insufficient documentation

## 2016-09-27 DIAGNOSIS — M47816 Spondylosis without myelopathy or radiculopathy, lumbar region: Secondary | ICD-10-CM

## 2016-09-27 DIAGNOSIS — G894 Chronic pain syndrome: Secondary | ICD-10-CM

## 2016-09-27 DIAGNOSIS — Z885 Allergy status to narcotic agent status: Secondary | ICD-10-CM | POA: Diagnosis not present

## 2016-09-27 DIAGNOSIS — M47894 Other spondylosis, thoracic region: Secondary | ICD-10-CM

## 2016-09-27 DIAGNOSIS — M4694 Unspecified inflammatory spondylopathy, thoracic region: Secondary | ICD-10-CM | POA: Diagnosis not present

## 2016-09-27 DIAGNOSIS — M47815 Spondylosis without myelopathy or radiculopathy, thoracolumbar region: Secondary | ICD-10-CM | POA: Diagnosis present

## 2016-09-27 DIAGNOSIS — M150 Primary generalized (osteo)arthritis: Secondary | ICD-10-CM

## 2016-09-27 DIAGNOSIS — M47814 Spondylosis without myelopathy or radiculopathy, thoracic region: Secondary | ICD-10-CM

## 2016-09-27 DIAGNOSIS — M7918 Myalgia, other site: Secondary | ICD-10-CM

## 2016-09-27 DIAGNOSIS — M791 Myalgia: Secondary | ICD-10-CM

## 2016-09-27 MED ORDER — ROPIVACAINE HCL 2 MG/ML IJ SOLN
INTRAMUSCULAR | Status: AC
  Filled 2016-09-27: qty 10

## 2016-09-27 MED ORDER — LIDOCAINE HCL (PF) 1 % IJ SOLN: 10.0000 mL | Freq: Once | INTRAMUSCULAR | Status: DC

## 2016-09-27 MED ORDER — FENTANYL CITRATE (PF) 100 MCG/2ML IJ SOLN
25.0000 ug | INTRAMUSCULAR | Status: DC | PRN
  Administered 2016-09-27: 100 ug via INTRAVENOUS

## 2016-09-27 MED ORDER — MIDAZOLAM HCL 5 MG/5ML IJ SOLN
1.0000 mg | INTRAMUSCULAR | Status: DC | PRN
  Administered 2016-09-27: 3 mg via INTRAVENOUS

## 2016-09-27 MED ORDER — DEXAMETHASONE SODIUM PHOSPHATE 10 MG/ML IJ SOLN
INTRAMUSCULAR | Status: AC
  Filled 2016-09-27: qty 1

## 2016-09-27 MED ORDER — TRAMADOL HCL 50 MG PO TABS: 50.0000 mg | ORAL_TABLET | Freq: Four times a day (QID) | ORAL | 2 refills | Status: DC | PRN

## 2016-09-27 MED ORDER — ROPIVACAINE HCL 2 MG/ML IJ SOLN
9.0000 mL | Freq: Once | INTRAMUSCULAR | Status: AC
  Administered 2016-09-27: 10 mL via PERINEURAL

## 2016-09-27 MED ORDER — MIDAZOLAM HCL 5 MG/5ML IJ SOLN
INTRAMUSCULAR | Status: AC
  Filled 2016-09-27: qty 5

## 2016-09-27 MED ORDER — CYCLOBENZAPRINE HCL 10 MG PO TABS: 10.0000 mg | ORAL_TABLET | Freq: Three times a day (TID) | ORAL | 2 refills | Status: DC | PRN

## 2016-09-27 MED ORDER — DEXAMETHASONE SODIUM PHOSPHATE 10 MG/ML IJ SOLN
10.0000 mg | Freq: Once | INTRAMUSCULAR | Status: AC
  Administered 2016-09-27: 10 mg

## 2016-09-27 MED ORDER — LACTATED RINGERS IV SOLN
1000.0000 mL | Freq: Once | INTRAVENOUS | Status: AC
  Administered 2016-09-27: 1000 mL via INTRAVENOUS

## 2016-09-27 MED ORDER — TRIAMCINOLONE ACETONIDE 40 MG/ML IJ SUSP
INTRAMUSCULAR | Status: AC
  Filled 2016-09-27: qty 1

## 2016-09-27 MED ORDER — ROPIVACAINE HCL 2 MG/ML IJ SOLN
9.0000 mL | Freq: Once | INTRAMUSCULAR | Status: AC
  Administered 2016-09-27: 10 mL

## 2016-09-27 MED ORDER — MELOXICAM 15 MG PO TABS: 15.0000 mg | ORAL_TABLET | Freq: Every day | ORAL | 2 refills | Status: DC

## 2016-09-27 MED ORDER — FENTANYL CITRATE (PF) 100 MCG/2ML IJ SOLN
INTRAMUSCULAR | Status: AC
  Filled 2016-09-27: qty 2

## 2016-09-27 MED ORDER — GABAPENTIN 100 MG PO CAPS: 100.0000 mg | ORAL_CAPSULE | Freq: Every day | ORAL | 2 refills | Status: DC

## 2016-09-27 MED ORDER — TRIAMCINOLONE ACETONIDE 40 MG/ML IJ SUSP
40.0000 mg | Freq: Once | INTRAMUSCULAR | Status: AC
  Administered 2016-09-27: 40 mg

## 2016-09-27 NOTE — Progress Notes (Signed)
Patient's Name: Deanna Jacobson  MRN: [id number]  Referring Provider: Milinda Pointer, MD  DOB: 02/23/1956  PCP: [place]  DOS: 09/27/2016  Note by: Kathlen Brunswick. Dossie Arbour, MD  Service setting: Ambulatory outpatient  Location: [facility] (AMB) Pain Management Facility  Visit type: Procedure  Specialty: Interventional Pain Management  Patient type: Established   Primary Reason for Visit: Interventional Pain Management Treatment. CC: Back Pain (mid)  Procedure:  Anesthesia, Analgesia, Anxiolysis:  Type: Therapeutic Medial Branch Facet Radiofrequency Ablation Region: Lumbar Level: T12, L1, L2, & L3Medial Branch Level(s) Laterality: Right-Sided  Type: Local Anesthesia with Moderate (Conscious) Sedation Local Anesthetic: Lidocaine 1% Route: Intravenous (IV) IV Access: Secured Sedation: Meaningful verbal contact was maintained at all times during the procedure  Indication(s): Analgesia and Anxiety  Indications: 1. Lumbar facet arthropathy ([place])   2. Thoracic facet syndrome ([place])   3. Thoracolumbar spondylosis   4. Chronic pain syndrome   5. Musculoskeletal pain   6. Neurogenic pain   7. Primary osteoarthritis involving multiple joints    Ms. Schroyer has either failed to respond, was unable to tolerate, or simply did not get enough benefit from other more conservative therapies including, but not limited to: 1. Over-the-counter medications 2. Anti-inflammatory medications 3. Muscle relaxants 4. Membrane stabilizers 5. Opioids 6. Physical therapy 7. Modalities (Heat, ice, etc.) 8. Invasive techniques such as nerve blocks. Ms. Lombardozzi has attained more than 50% relief of the pain from a series of diagnostic injections conducted in separate occasions.  Pain Score: Pre-procedure: 2 /10 Post-procedure: 0-No pain/10  Pre-op Assessment:  Previous date of service: 08/10/16 Service provided: Med Refill Deanna Jacobson is a 61 y.o. (year old), female patient,  seen today for interventional treatment. She  has a past surgical history that includes Breast enhancement surgery. Her primarily concern today is the Back Pain (mid)  Initial Vital Signs: Blood pressure (!) 190/93, pulse 88, temperature 97.7 F (36.5 C), temperature source Oral, resp. rate 16, height '5\' 1"'$  (1.549 m), weight 102 lb (46.3 kg), SpO2 100 %. BMI: 19.27 kg/m  Risk Assessment: Allergies: Reviewed. She is allergic to codeine.  Allergy Precautions: None required Coagulopathies: "Reviewed. None identified.  Blood-thinner therapy: None at this time Active Infection(s): Reviewed. None identified. Ms. Polhamus is afebrile  Site Confirmation: Ms. Crayton was asked to confirm the procedure and laterality before marking the site Procedure checklist: Completed Consent: Before the procedure and under the influence of no sedative(s), amnesic(s), or anxiolytics, the patient was informed of the treatment options, risks and possible complications. To fulfill our ethical and legal obligations, as recommended by the American Medical Association's Code of Ethics, I have informed the patient of my clinical impression; the nature and purpose of the treatment or procedure; the risks, benefits, and possible complications of the intervention; the alternatives, including doing nothing; the risk(s) and benefit(s) of the alternative treatment(s) or procedure(s); and the risk(s) and benefit(s) of doing nothing. The patient was provided information about the general risks and possible complications associated with the procedure. These may include, but are not limited to: failure to achieve desired goals, infection, bleeding, organ or nerve damage, allergic reactions, paralysis, and death. In addition, the patient was informed of those risks and complications associated to Spine-related procedures, such as failure to decrease pain; infection (i.e.: Meningitis, epidural or intraspinal abscess); bleeding (i.e.:  epidural hematoma, subarachnoid hemorrhage, or any other type of intraspinal or peri-dural bleeding); organ or nerve damage (i.e.: Any type of peripheral nerve, nerve root, or spinal cord  injury) with subsequent damage to sensory, motor, and/or autonomic systems, resulting in permanent pain, numbness, and/or weakness of one or several areas of the body; allergic reactions; (i.e.: anaphylactic reaction); and/or death. Furthermore, the patient was informed of those risks and complications associated with the medications. These include, but are not limited to: allergic reactions (i.e.: anaphylactic or anaphylactoid reaction(s)); adrenal axis suppression; blood sugar elevation that in diabetics may result in ketoacidosis or comma; water retention that in patients with history of congestive heart failure may result in shortness of breath, pulmonary edema, and decompensation with resultant heart failure; weight gain; swelling or edema; medication-induced neural toxicity; particulate matter embolism and blood vessel occlusion with resultant organ, and/or nervous system infarction; and/or aseptic necrosis of one or more joints. Finally, the patient was informed that Medicine is not an exact science; therefore, there is also the possibility of unforeseen or unpredictable risks and/or possible complications that may result in a catastrophic outcome. The patient indicated having understood very clearly. We have given the patient no guarantees and we have made no promises. Enough time was given to the patient to ask questions, all of which were answered to the patient's satisfaction. Ms. Heckstall has indicated that she wanted to continue with the procedure. Attestation: I, the ordering provider, attest that I have discussed with the patient the benefits, risks, side-effects, alternatives, likelihood of achieving goals, and potential problems during recovery for the procedure that I have provided informed consent. Date:  09/27/2016; Time: 11:10 AM  Pre-Procedure Preparation:  Monitoring: As per clinic protocol. Respiration, ETCO2, SpO2, BP, heart rate and rhythm monitor placed and checked for adequate function Safety Precautions: Patient was assessed for positional comfort and pressure points before starting the procedure. Time-out: I initiated and conducted the "Time-out" before starting the procedure, as per protocol. The patient was asked to participate by confirming the accuracy of the "Time Out" information. Verification of the correct person, site, and procedure were performed and confirmed by me, the nursing staff, and the patient. "Time-out" conducted as per Joint Commission's Universal Protocol (UP.01.01.01). "Time-out" Date & Time: 09/27/2016; 1135 hrs.  Description of Procedure Process:   Position: Prone Target Area: For Thoracolumbar Facet blocks, the target is the groove formed by the junction of the transverse process and superior articular process.  Approach: Paraspinal approach. Area Prepped: Entire Posterior Lumbosacral Region Prepping solution: Hibiclens (4.0% Chlorhexidine gluconate solution) Safety Precautions: Aspiration looking for blood return was conducted prior to all injections. At no point did we inject any substances, as a needle was being advanced. No attempts were made at seeking any paresthesias. Safe injection practices and needle disposal techniques used. Medications properly checked for expiration dates. SDV (single dose vial) medications used. Description of the Procedure: Protocol guidelines were followed. The patient was placed in position over the fluoroscopy table. The target area was identified and the area prepped in the usual manner. Skin desensitized using vapocoolant spray. Skin & deeper tissues infiltrated with local anesthetic. Appropriate amount of time allowed to pass for local anesthetics to take effect. Radiofrequency needles were introduced to the area of the medial  branch at the junction of the superior articular process and transverse process using fluoroscopy. Using the [employer], sensory stimulation using 50 Hz was used to locate & identify the nerve, making sure that the needle was positioned such that there was no sensory stimulation below 0.3 V or above 0.7 V. Stimulation using 2 Hz was used to evaluate the motor component. Care was taken not  to lesion any nerves that demonstrated motor stimulation of the lower extremities at an output of less than 2.5 times that of the sensory threshold, or a maximum of 2.0 V. Once satisfactory placement of the needles was achieved, the above solution was slowly injected after negative aspiration. After waiting for at least 2 minutes, the ablation was performed at 80 degrees C for 60 seconds.The needles were then removed and the area cleansed, making sure to leave some of the prepping solution back to take advantage of its long term bactericidal properties. Vitals:   09/27/16 1204 09/27/16 1214 09/27/16 1224 09/27/16 1234  BP: (!) 139/91 (!) 169/95 (!) 187/84 (!) 163/83  Pulse: 83 81 80 80  Resp: '13 15 16 14  '$ Temp:  (!) 96.6 F (35.9 C)    TempSrc:      SpO2: 94% 100% 100% 97%  Weight:      Height:        Start Time: 1135 hrs. End Time: 1204 hrs. Materials & Medications:  Needle(s) Type: Teflon-coated, curved tip, Radiofrequency needle(s) Gauge: 22G Length: 10cm Medication(s): We administered lactated ringers, midazolam, fentaNYL, dexamethasone, ropivacaine (PF) 2 mg/mL (0.2%), triamcinolone acetonide, and ropivacaine (PF) 2 mg/mL (0.2%). Please see chart orders for dosing details.  Imaging Guidance (Spinal):  Type of Imaging Technique: Fluoroscopy Guidance (Spinal) Indication(s): Assistance in needle guidance and placement for procedures requiring needle placement in or near specific anatomical locations not easily accessible without such assistance. Exposure Time: Please see nurses  notes. Contrast: None used. Fluoroscopic Guidance: I was personally present during the use of fluoroscopy. "Tunnel Vision Technique" used to obtain the best possible view of the target area. Parallax error corrected before commencing the procedure. "Direction-depth-direction" technique used to introduce the needle under continuous pulsed fluoroscopy. Once target was reached, antero-posterior, oblique, and lateral fluoroscopic projection used confirm needle placement in all planes. Images permanently stored in EMR. Interpretation: No contrast injected. I personally interpreted the imaging intraoperatively. Adequate needle placement confirmed in multiple planes. Permanent images saved into the patient's record.  Antibiotic Prophylaxis:  Indication(s): None identified Antibiotic given: None  Post-operative Assessment:  EBL: None Complications: No immediate post-treatment complications observed by team, or reported by patient. Note: The patient tolerated the entire procedure well. A repeat set of vitals were taken after the procedure and the patient was kept under observation following institutional policy, for this type of procedure. Post-procedural neurological assessment was performed, showing return to baseline, prior to discharge. The patient was provided with post-procedure discharge instructions, including a section on how to identify potential problems. Should any problems arise concerning this procedure, the patient was given instructions to immediately contact us, at any time, without hesitation. In any case, we plan to contact the patient by telephone for a follow-up status report regarding this interventional procedure. Comments:  No additional relevant information.  Plan of Care  Disposition: Discharge home  Discharge Date & Time: 09/27/2016; 1237 hrs.  Physician-requested Follow-up:  Return for Post-RFA Eval, (6 wks), by MD, in addition, Med-Mgmt, w/ MD.  Future Appointments Date Time  Provider [place]  11/08/2016 1:00 PM Milinda Pointer, MD [facility] None   Medications ordered for procedure: Meds ordered this encounter  Medications  . lactated ringers infusion 1,000 mL  . midazolam (VERSED) 5 MG/5ML injection 1-2 mg    Make sure Flumazenil is available in the pyxis when using this medication. If oversedation occurs, administer 0.2 mg IV over 15 sec. If after 45 sec no response, administer 0.2 mg again  over 1 min; may repeat at 1 min intervals; not to exceed 4 doses (1 mg)  . fentaNYL (SUBLIMAZE) injection 25-50 mcg    Make sure Narcan is available in the pyxis when using this medication. In the event of respiratory depression (RR< 8/min): Titrate NARCAN (naloxone) in increments of 0.1 to 0.2 mg IV at 2-3 minute intervals, until desired degree of reversal.  . dexamethasone (DECADRON) injection 10 mg  . lidocaine (PF) (XYLOCAINE) 1 % injection 10 mL  . ropivacaine (PF) 2 mg/mL (0.2%) (NAROPIN) injection 9 mL  . triamcinolone acetonide (KENALOG-40) injection 40 mg  . lidocaine (PF) (XYLOCAINE) 1 % injection 10 mL  . ropivacaine (PF) 2 mg/mL (0.2%) (NAROPIN) injection 9 mL  . traMADol (ULTRAM) 50 MG tablet    Sig: Take 1-2 tablets (50-100 mg total) by mouth every 6 (six) hours as needed for severe pain.    Dispense:  240 tablet    Refill:  2    Do not add this medication to the electronic "Automatic Refill" notification system. Patient may have prescription filled one day early if pharmacy is closed on scheduled refill date. Do not fill until: 09/27/16 To last until: 12/26/16  . cyclobenzaprine (FLEXERIL) 10 MG tablet    Sig: Take 1 tablet (10 mg total) by mouth every 8 (eight) hours as needed for muscle spasms.    Dispense:  90 tablet    Refill:  2    Do not add this medication to the electronic "Automatic Refill" notification system. Patient may have prescription filled one day early if pharmacy is closed on scheduled refill date.  . gabapentin (NEURONTIN)  100 MG capsule    Sig: Take 1-3 capsules (100-300 mg total) by mouth at bedtime.    Dispense:  90 capsule    Refill:  2    Do not place this medication, or any other prescription from our practice, on "Automatic Refill". Patient may have prescription filled one day early if pharmacy is closed on scheduled refill date.  . meloxicam (MOBIC) 15 MG tablet    Sig: Take 1 tablet (15 mg total) by mouth daily.    Dispense:  30 tablet    Refill:  2    Do not add this medication to the electronic "Automatic Refill" notification system. Patient may have prescription filled one day early if pharmacy is closed on scheduled refill date.   Medications administered: We administered lactated ringers, midazolam, fentaNYL, dexamethasone, ropivacaine (PF) 2 mg/mL (0.2%), triamcinolone acetonide, and ropivacaine (PF) 2 mg/mL (0.2%).  See the medical record for exact dosing, route, and time of administration.  Lab-work, Procedure(s), & Referral(s) Ordered: Orders Placed This Encounter  Procedures  . Radiofrequency,Lumbar  . Radiofrequency,Thoracic  . DG C-Arm 1-60 Min-No Report  . Informed Consent Details: Transcribe to consent form and obtain patient signature  . Provider attestation of informed consent for procedure/surgical case  . Verify informed consent  . Discharge instructions  . Follow-up   Imaging Ordered: Results for orders placed in visit on 06/30/16  DG C-Arm 1-60 Min-No Report   Narrative Fluoroscopy was utilized by the requesting physician.  No radiographic  interpretation.    New Prescriptions   No medications on file   Primary Care Physician: [place] Location: [facility] Note by: Kathlen Brunswick. Dossie Arbour, M.D, DABA, DABAPM, DABPM, DABIPP, FIPP Date: 09/27/2016; Time: 3:30 PM  Disclaimer:  Medicine is not an Chief Strategy Officer. The only guarantee in medicine is that nothing is  guaranteed. It is important to note that the decision to  proceed with this intervention was based on the information collected from the patient. The Data and conclusions were drawn from the patient's questionnaire, the interview, and the physical examination. Because the information was provided in large part by the patient, it cannot be guaranteed that it has not been purposely or unconsciously manipulated. Every effort has been made to obtain as much relevant data as possible for this evaluation. It is important to note that the conclusions that lead to this procedure are derived in large part from the available data. Always take into account that the treatment will also be dependent on availability of resources and existing treatment guidelines, considered by other Pain Management Practitioners as being common knowledge and practice, at the time of the intervention. For Medico-Legal purposes, it is also important to point out that variation in procedural techniques and pharmacological choices are the acceptable norm. The indications, contraindications, technique, and results of the above procedure should only be interpreted and judged by a Board-Certified Interventional Pain Specialist with extensive familiarity and expertise in the same exact procedure and technique.  Instructions provided at this appointment: Patient Instructions   Post-Procedure instructions Instructions:  Apply ice: Fill a plastic sandwich bag with crushed ice. Cover it with a small towel and apply to injection site. Apply for 15 minutes then remove x 15 minutes. Repeat sequence on day of procedure, until you go to bed. The purpose is to minimize swelling and discomfort after procedure.  Apply heat: Apply heat to procedure site starting the day following the procedure. The purpose is to treat any soreness and discomfort from the procedure.  Food intake: Start with clear liquids (like water) and advance to regular food, as tolerated.   Physical activities: Keep activities to a minimum  for the first 8 hours after the procedure.   Driving: If you have received any sedation, you are not allowed to drive for 24 hours after your procedure.  Blood thinner: Restart your blood thinner 6 hours after your procedure. (Only for those taking blood thinners)  Insulin: As soon as you can eat, you may resume your normal dosing schedule. (Only for those taking insulin)  Infection prevention: Keep procedure site clean and dry.  Post-procedure Pain Diary: Extremely important that this be done correctly and accurately. Recorded information will be used to determine the next step in treatment.  Pain evaluated is that of treated area only. Do not include pain from an untreated area.  Complete every hour, on the hour, for the initial 8 hours. Set an alarm to help you do this part accurately.  Do not go to sleep and have it completed later. It will not be accurate.  Follow-up appointment: Keep your follow-up appointment after the procedure. Usually 2 weeks for most procedures. (6 weeks in the case of radiofrequency.) Bring you pain diary.  Expect:  From numbing medicine (AKA: Local Anesthetics): Numbness or decrease in pain.  Onset: Full effect within 15 minutes of injected.  Duration: It will depend on the type of local anesthetic used. On the average, 1 to 8 hours.   From steroids: Decrease in swelling or inflammation. Once inflammation is improved, relief of the pain will follow.  Onset of benefits: Depends on the amount of swelling present. The more swelling, the longer it will take for the benefits to be seen.   Duration: Steroids will stay in the system x 2 weeks. Duration of benefits will depend on multiple  posibilities including persistent irritating factors.  From procedure: Some discomfort is to be expected once the numbing medicine wears off. This should be minimal if ice and heat are applied as instructed. Call if:  You experience numbness and weakness that gets worse with  time, as opposed to wearing off.  New onset bowel or bladder incontinence. (Spinal procedures only)  Emergency Numbers:  Durning business hours (Monday - Thursday, 8:00 AM - 4:00 PM) (Friday, 9:00 AM - 12:00 Noon): [phone]  After hours: [phone] _____________________________________________________________________________________________  Pain Management Discharge Instructions  General Discharge Instructions :  If you need to reach your doctor call: Monday-Friday 8:00 am - 4:00 pm at [phone] or toll free [phone].  After clinic hours [phone] to have operator reach doctor.  Bring all of your medication bottles to all your appointments in the pain clinic.  To cancel or reschedule your appointment with Pain Management please remember to call 24 hours in advance to avoid a fee.  Refer to the educational materials which you have been given on: General Risks, I had my Procedure. Discharge Instructions, Post Sedation.  Post Procedure Instructions:  The drugs you were given will stay in your system until tomorrow, so for the next 24 hours you should not drive, make any legal decisions or drink any alcoholic beverages.  You may eat anything you prefer, but it is better to start with liquids then soups and crackers, and gradually work up to solid foods.  Please notify your doctor immediately if you have any unusual bleeding, trouble breathing or pain that is not related to your normal pain.  Depending on the type of procedure that was done, some parts of your body may feel week and/or numb.  This usually clears up by tonight or the next day.  Walk with the use of an assistive device or accompanied by an adult for the 24 hours.  You may use ice on the affected area for the first 24 hours.  Put ice in a Ziploc bag and cover with a towel and place against area 15 minutes on 15 minutes off.  You may switch to heat after 24 hours.Radiofrequency Lesioning, Care  After Refer to this sheet in the next few weeks. These instructions provide you with information about caring for yourself after your procedure. Your health care provider may also give you more specific instructions. Your treatment has been planned according to current medical practices, but problems sometimes occur. Call your health care provider if you have any problems or questions after your procedure. What can I expect after the procedure? After the procedure, it is common to have:  Pain from the burned nerve.  Temporary numbness. Follow these instructions at home:  Take over-the-counter and prescription medicines only as told by your health care provider.  Return to your normal activities as told by your health care provider. Ask your health care provider what activities are safe for you.  Pay close attention to how you feel after the procedure. If you start to have pain, write down when it hurts and how it feels. This will help you and your health care provider to know if you need an additional treatment.  Check your needle insertion site every day for signs of infection. Watch for:  Redness, swelling, or pain.  Fluid, blood, or pus.  Keep all follow-up visits as told by your health care provider. This is important. Contact a health care provider if:  Your pain does not get better.  You  have redness, swelling, or pain at the needle insertion site.  You have fluid, blood, or pus coming from the needle insertion site.  You have a fever. Get help right away if:  You develop sudden, severe pain.  You develop numbness or tingling near the procedure site that does not go away. This information is not intended to replace advice given to you by your health care provider. Make sure you discuss any questions you have with your health care provider. Document Released: 01/14/2011 Document Revised: 10/23/2015 Document Reviewed: 06/24/2014 Elsevier Interactive Patient Education  2017  [place]. Radiofrequency Lesioning Radiofrequency lesioning is a procedure that is performed to relieve pain. The procedure is often used for back, neck, or arm pain. Radiofrequency lesioning involves the use of a machine that creates radio waves to make heat. During the procedure, the heat is applied to the nerve that carries the pain signal. The heat damages the nerve and interferes with the pain signal. Pain relief usually starts about 2 weeks after the procedure and lasts for 6 months to 1 year. Tell a health care provider about:  Any allergies you have.  All medicines you are taking, including vitamins, herbs, eye drops, creams, and over-the-counter medicines.  Any problems you or family members have had with anesthetic medicines.  Any blood disorders you have.  Any surgeries you have had.  Any medical conditions you have.  Whether you are pregnant or may be pregnant. What are the risks? Generally, this is a safe procedure. However, problems may occur, including:  Pain or soreness at the injection site.  Infection at the injection site.  Damage to nerves or blood vessels. What happens before the procedure?  Ask your health care provider about:  Changing or stopping your regular medicines. This is especially important if you are taking diabetes medicines or blood thinners.  Taking medicines such as aspirin and ibuprofen. These medicines can thin your blood. Do not take these medicines before your procedure if your health care provider instructs you not to.  Follow instructions from your health care provider about eating or drinking restrictions.  Plan to have someone take you home after the procedure.  If you go home right after the procedure, plan to have someone with you for 24 hours. What happens during the procedure?  You will be given one or more of the following:  A medicine to help you relax (sedative).  A medicine to numb the area (local  anesthetic).  You will be awake during the procedure. You will need to be able to talk with the health care provider during the procedure.  With the help of a type of X-ray (fluoroscopy), the health care provider will insert a radiofrequency needle into the area to be treated.  Next, a wire that carries the radio waves (electrode) will be put through the radiofrequency needle. An electrical pulse will be sent through the electrode to verify the correct nerve. You will feel a tingling sensation, and you may have muscle twitching.  Then, the tissue that is around the needle tip will be heated by an electric current that is passed using the radiofrequency machine. This will numb the nerves.  A bandage (dressing) will be put on the insertion area after the procedure is done. The procedure may vary among health care providers and hospitals. What happens after the procedure?  Your blood pressure, heart rate, breathing rate, and blood oxygen level will be monitored often until the medicines you were given have worn  off.  Return to your normal activities as directed by your health care provider. This information is not intended to replace advice given to you by your health care provider. Make sure you discuss any questions you have with your health care provider. Document Released: 01/13/2011 Document Revised: 10/23/2015 Document Reviewed: 06/24/2014 Elsevier Interactive Patient Education  2017 [place]  What are the risk, side effects and possible complications? Generally speaking, most procedures are safe.  However, with any procedure there are risks, side effects, and the possibility of complications.  The risks and complications are dependent upon the sites that are lesioned, or the type of nerve block to be performed.  The closer the procedure is to the spine, the more serious the risks are.  Great care is taken when placing the radio frequency needles, block  needles or lesioning probes, but sometimes complications can occur. 1. Infection: Any time there is an injection through the skin, there is a risk of infection.  This is why sterile conditions are used for these blocks.  There are four possible types of infection. 1. Localized skin infection. 2. Central Nervous System Infection-This can be in the form of Meningitis, which can be deadly. 3. Epidural Infections-This can be in the form of an epidural abscess, which can cause pressure inside of the spine, causing compression of the spinal cord with subsequent paralysis. This would require an emergency surgery to decompress, and there are no guarantees that the patient would recover from the paralysis. 4. Discitis-This is an infection of the intervertebral discs.  It occurs in about 1% of discography procedures.  It is difficult to treat and it may lead to surgery.        2. Pain: the needles have to go through skin and soft tissues, will cause soreness.       3. Damage to internal structures:  The nerves to be lesioned may be near blood vessels or    other nerves which can be potentially damaged.       4. Bleeding: Bleeding is more common if the patient is taking blood thinners such as  aspirin, Coumadin, Ticiid, Plavix, etc., or if he/she have some genetic predisposition  such as hemophilia. Bleeding into the spinal canal can cause compression of the spinal  cord with subsequent paralysis.  This would require an emergency surgery to  decompress and there are no guarantees that the patient would recover from the  paralysis.       5. Pneumothorax:  Puncturing of a lung is a possibility, every time a needle is introduced in  the area of the chest or upper back.  Pneumothorax refers to free air around the  collapsed lung(s), inside of the thoracic cavity (chest cavity).  Another two possible  complications related to a similar event would include: Hemothorax and Chylothorax.   These are variations of the  Pneumothorax, where instead of air around the collapsed  lung(s), you may have blood or chyle, respectively.       6. Spinal headaches: They may occur with any procedures in the area of the spine.       7. Persistent CSF (Cerebro-Spinal Fluid) leakage: This is a rare problem, but may occur  with prolonged intrathecal or epidural catheters either due to the formation of a fistulous  track or a dural tear.       8. Nerve damage: By working so close to the spinal cord, there is always a possibility of  nerve damage, which could be as serious as a permanent spinal cord injury with  paralysis.       9. Death:  Although rare, severe deadly allergic reactions known as "Anaphylactic  reaction" can occur to any of the medications used.      10. Worsening of the symptoms:  We can always make thing worse.  What are the chances of something like this happening? Chances of any of this occuring are extremely low.  By statistics, you have more of a chance of getting killed in a motor vehicle accident: while driving to the hospital than any of the above occurring .  Nevertheless, you should be aware that they are possibilities.  In general, it is similar to taking a shower.  Everybody knows that you can slip, hit your head and get killed.  Does that mean that you should not shower again?  Nevertheless always keep in mind that statistics do not mean anything if you happen to be on the wrong side of them.  Even if a procedure has a 1 (one) in a 1,000,000 (million) chance of going wrong, it you happen to be that one..Also, keep in mind that by statistics, you have more of a chance of having something go wrong when taking medications.  Who should not have this procedure? If you are on a blood thinning medication (e.g. Coumadin, Plavix, see list of "Blood Thinners"), or if you have an active infection going on, you should not have the procedure.  If you are taking any blood thinners, please inform your physician.  How  should I prepare for this procedure?  Do not eat or drink anything at least six hours prior to the procedure.  Bring a driver with you .  It cannot be a taxi.  Come accompanied by an adult that can drive you back, and that is strong enough to help you if your legs get weak or numb from the local anesthetic.  Take all of your medicines the morning of the procedure with just enough water to swallow them.  If you have diabetes, make sure that you are scheduled to have your procedure done first thing in the morning, whenever possible.  If you have diabetes, take only half of your insulin dose and notify our nurse that you have done so as soon as you arrive at the clinic.  If you are diabetic, but only take blood sugar pills (oral hypoglycemic), then do not take them on the morning of your procedure.  You may take them after you have had the procedure.  Do not take aspirin or any aspirin-containing medications, at least eleven (11) days prior to the procedure.  They may prolong bleeding.  Wear loose fitting clothing that may be easy to take off and that you would not mind if it got stained with Betadine or blood.  Do not wear any jewelry or perfume  Remove any nail coloring.  It will interfere with some of our monitoring equipment.  NOTE: Remember that this is not meant to be interpreted as a complete list of all possible complications.  Unforeseen problems may occur.  BLOOD THINNERS The following drugs contain aspirin or other products, which can cause increased bleeding during surgery and should not be taken for 2 weeks prior to and 1 week after surgery.  If you should need take something for relief of minor pain, you may take acetaminophen which is found in Tylenol,m Datril, Anacin-3 and Panadol. It is not blood thinner.  The products listed below are.  Do not take any of the products listed below in addition to any listed on your instruction sheet.  A.P.C or A.P.C with Codeine Codeine  Phosphate Capsules #3 Ibuprofen Ridaura  ABC compound Congesprin Imuran rimadil  Advil Cope Indocin Robaxisal  Alka-Seltzer Effervescent Pain Reliever and Antacid Coricidin or Coricidin-D  Indomethacin Rufen  Alka-Seltzer plus Cold Medicine Cosprin Ketoprofen S-A-C Tablets  Anacin Analgesic Tablets or Capsules Coumadin Korlgesic Salflex  Anacin Extra Strength Analgesic tablets or capsules CP-2 Tablets Lanoril Salicylate  Anaprox Cuprimine Capsules Levenox Salocol  Anexsia-D Dalteparin Magan Salsalate  Anodynos Darvon compound Magnesium Salicylate Sine-off  Ansaid Dasin Capsules Magsal Sodium Salicylate  Anturane Depen Capsules Marnal Soma  APF Arthritis pain formula Dewitt's Pills Measurin Stanback  Argesic Dia-Gesic Meclofenamic Sulfinpyrazone  Arthritis Bayer Timed Release Aspirin Diclofenac Meclomen Sulindac  Arthritis pain formula Anacin Dicumarol Medipren Supac  Analgesic (Safety coated) Arthralgen Diffunasal Mefanamic Suprofen  Arthritis Strength Bufferin Dihydrocodeine Mepro Compound Suprol  Arthropan liquid Dopirydamole Methcarbomol with Aspirin Synalgos  ASA tablets/Enseals Disalcid Micrainin Tagament  Ascriptin Doan's Midol Talwin  Ascriptin A/D Dolene Mobidin Tanderil  Ascriptin Extra Strength Dolobid Moblgesic Ticlid  Ascriptin with Codeine Doloprin or Doloprin with Codeine Momentum Tolectin  Asperbuf Duoprin Mono-gesic Trendar  Aspergum Duradyne Motrin or Motrin IB Triminicin  Aspirin plain, buffered or enteric coated Durasal Myochrisine Trigesic  Aspirin Suppositories Easprin Nalfon Trillsate  Aspirin with Codeine Ecotrin Regular or Extra Strength Naprosyn Uracel  Atromid-S Efficin Naproxen Ursinus  Auranofin Capsules Elmiron Neocylate Vanquish  Axotal Emagrin Norgesic Verin  Azathioprine Empirin or Empirin with Codeine Normiflo Vitamin E  Azolid Emprazil Nuprin Voltaren  Bayer Aspirin plain, buffered or children's or timed BC Tablets or powders Encaprin Orgaran  Warfarin Sodium  Buff-a-Comp Enoxaparin Orudis Zorpin  Buff-a-Comp with Codeine Equegesic Os-Cal-Gesic   Buffaprin Excedrin plain, buffered or Extra Strength Oxalid   Bufferin Arthritis Strength Feldene Oxphenbutazone   Bufferin plain or Extra Strength Feldene Capsules Oxycodone with Aspirin   Bufferin with Codeine Fenoprofen Fenoprofen Pabalate or Pabalate-SF   Buffets II Flogesic Panagesic   Buffinol plain or Extra Strength Florinal or Florinal with Codeine Panwarfarin   Buf-Tabs Flurbiprofen Penicillamine   Butalbital Compound Four-way cold tablets Penicillin   Butazolidin Fragmin Pepto-Bismol   Carbenicillin Geminisyn Percodan   Carna Arthritis Reliever Geopen Persantine   Carprofen Gold's salt Persistin   Chloramphenicol Goody's Phenylbutazone   Chloromycetin Haltrain Piroxlcam   Clmetidine heparin Plaquenil   Cllnoril Hyco-pap Ponstel   Clofibrate Hydroxy chloroquine Propoxyphen         Before stopping any of these medications, be sure to consult the physician who ordered them.  Some, such as Coumadin (Warfarin) are ordered to prevent or treat serious conditions such as "deep thrombosis", "pumonary embolisms", and other heart problems.  The amount of time that you may need off of the medication may also vary with the medication and the reason for which you were taking it.  If you are taking any of these medications, please make sure you notify your pain physician before you undergo any procedures. Post-Procedure instructions Instructions:  Apply ice: Fill a plastic sandwich bag with crushed ice. Cover it with a small towel and apply to injection site. Apply for 15 minutes then remove x 15 minutes. Repeat sequence on day of procedure, until you go to bed. The purpose is to minimize swelling and discomfort after procedure.  Apply heat: Apply heat to procedure site starting the day following the procedure. The purpose  is to treat any soreness and discomfort from the procedure.  Food  intake: Start with clear liquids (like water) and advance to regular food, as tolerated.   Physical activities: Keep activities to a minimum for the first 8 hours after the procedure.   Driving: If you have received any sedation, you are not allowed to drive for 24 hours after your procedure.  Blood thinner: Restart your blood thinner 6 hours after your procedure. (Only for those taking blood thinners)  Insulin: As soon as you can eat, you may resume your normal dosing schedule. (Only for those taking insulin)  Infection prevention: Keep procedure site clean and dry.  Post-procedure Pain Diary: Extremely important that this be done correctly and accurately. Recorded information will be used to determine the next step in treatment.  Pain evaluated is that of treated area only. Do not include pain from an untreated area.  Complete every hour, on the hour, for the initial 8 hours. Set an alarm to help you do this part accurately.  Do not go to sleep and have it completed later. It will not be accurate.  Follow-up appointment: Keep your follow-up appointment after the procedure. Usually 2 weeks for most procedures. (6 weeks in the case of radiofrequency.) Bring you pain diary.  Expect:  From numbing medicine (AKA: Local Anesthetics): Numbness or decrease in pain.  Onset: Full effect within 15 minutes of injected.  Duration: It will depend on the type of local anesthetic used. On the average, 1 to 8 hours.   From steroids: Decrease in swelling or inflammation. Once inflammation is improved, relief of the pain will follow.  Onset of benefits: Depends on the amount of swelling present. The more swelling, the longer it will take for the benefits to be seen.   Duration: Steroids will stay in the system x 2 weeks. Duration of benefits will depend on multiple posibilities including persistent irritating factors.  From procedure: Some discomfort is to be expected once the numbing medicine  wears off. This should be minimal if ice and heat are applied as instructed. Call if:  You experience numbness and weakness that gets worse with time, as opposed to wearing off.  New onset bowel or bladder incontinence. (Spinal procedures only)  Emergency Numbers:  Deport hours (Monday - Thursday, 8:00 AM - 4:00 PM) (Friday, 9:00 AM - 12:00 Noon): [phone]  After hours: [phone] _____________________________________________________________________________________________  Initial Gabapentin Titration  Medication used: Gabapentin (Generic Name) or Neurontin (Brand Name) 100 mg tablets/capsules  Reasons to stop increasing the dose:  Reason 1: You get good relief of symptoms, in which case there is no need to increase the daily dose any further.    Reason 2: You develop some side effects, such as sleeping all of the time, difficulty concentrating, or becoming disoriented, in which case you need to go down on the dose, to the prior level, where you were not experiencing any side effects. Stay on that dose longer, to allow more time for your body to get use it, before attempting to increase it again.   Steps: Step 1: Start by taking 1 (one) tablet at bedtime x 7 (seven) days.  Step 2: After being on 1 (one) tablet for 7 (seven) days, then increase it to 2 (two) tablets at bedtime for another 7 (seven) days.  Step 3: Next, after being on 2 (two) tablets at bedtime for 7 (seven) days, then increase it to 3 (three) tablets at bedtime, and stay on  that dose until you see your doctor.  Reasons to stop increasing the dose: Reason 1: You get good relief of symptoms, in which case there is no need to increase the daily dose any further.  Reason 2: You develop some side effects, such as sleeping all of the time, difficulty concentrating, or becoming disoriented, in which case you need to go down on the dose, to the prior level, where you were not experiencing any side  effects. Stay on that dose longer, to allow more time for your body to get use it, before attempting to increase it again.  Endpoint: Once you have reached the maximum dose you can tolerate without side-effects, contact your physician so as to evaluate the results of the regimen.   Questions: Feel free to contact us for any questions or problems at [phone] ______________________________________________________________________________________________

## 2016-09-27 NOTE — Progress Notes (Signed)
Safety precautions to be maintained throughout the outpatient stay will include: orient to surroundings, keep bed in low position, maintain call bell within reach at all times, provide assistance with transfer out of bed and ambulation.  

## 2016-09-27 NOTE — Patient Instructions (Addendum)
Post-Procedure instructions Instructions:  Apply ice: Fill a plastic sandwich bag with crushed ice. Cover it with a small towel and apply to injection site. Apply for 15 minutes then remove x 15 minutes. Repeat sequence on day of procedure, until you go to bed. The purpose is to minimize swelling and discomfort after procedure.  Apply heat: Apply heat to procedure site starting the day following the procedure. The purpose is to treat any soreness and discomfort from the procedure.  Food intake: Start with clear liquids (like water) and advance to regular food, as tolerated.   Physical activities: Keep activities to a minimum for the first 8 hours after the procedure.   Driving: If you have received any sedation, you are not allowed to drive for 24 hours after your procedure.  Blood thinner: Restart your blood thinner 6 hours after your procedure. (Only for those taking blood thinners)  Insulin: As soon as you can eat, you may resume your normal dosing schedule. (Only for those taking insulin)  Infection prevention: Keep procedure site clean and dry.  Post-procedure Pain Diary: Extremely important that this be done correctly and accurately. Recorded information will be used to determine the next step in treatment.  Pain evaluated is that of treated area only. Do not include pain from an untreated area.  Complete every hour, on the hour, for the initial 8 hours. Set an alarm to help you do this part accurately.  Do not go to sleep and have it completed later. It will not be accurate.  Follow-up appointment: Keep your follow-up appointment after the procedure. Usually 2 weeks for most procedures. (6 weeks in the case of radiofrequency.) Bring you pain diary.  Expect:  From numbing medicine (AKA: Local Anesthetics): Numbness or decrease in pain.  Onset: Full effect within 15 minutes of injected.  Duration: It will depend on the type of local anesthetic used. On the average, 1 to 8  hours.   From steroids: Decrease in swelling or inflammation. Once inflammation is improved, relief of the pain will follow.  Onset of benefits: Depends on the amount of swelling present. The more swelling, the longer it will take for the benefits to be seen.   Duration: Steroids will stay in the system x 2 weeks. Duration of benefits will depend on multiple posibilities including persistent irritating factors.  From procedure: Some discomfort is to be expected once the numbing medicine wears off. This should be minimal if ice and heat are applied as instructed. Call if:  You experience numbness and weakness that gets worse with time, as opposed to wearing off.  New onset bowel or bladder incontinence. (Spinal procedures only)  Emergency Numbers:  Durning business hours (Monday - Thursday, 8:00 AM - 4:00 PM) (Friday, 9:00 AM - 12:00 Noon): [phone]  After hours: [phone] _____________________________________________________________________________________________  Pain Management Discharge Instructions  General Discharge Instructions :  If you need to reach your doctor call: Monday-Friday 8:00 am - 4:00 pm at [phone] or toll free [phone].  After clinic hours [phone] to have operator reach doctor.  Bring all of your medication bottles to all your appointments in the pain clinic.  To cancel or reschedule your appointment with Pain Management please remember to call 24 hours in advance to avoid a fee.  Refer to the educational materials which you have been given on: General Risks, I had my Procedure. Discharge Instructions, Post Sedation.  Post Procedure Instructions:  The drugs you were given will stay in your system until  tomorrow, so for the next 24 hours you should not drive, make any legal decisions or drink any alcoholic beverages.  You may eat anything you prefer, but it is better to start with liquids then soups and crackers, and gradually work  up to solid foods.  Please notify your doctor immediately if you have any unusual bleeding, trouble breathing or pain that is not related to your normal pain.  Depending on the type of procedure that was done, some parts of your body may feel week and/or numb.  This usually clears up by tonight or the next day.  Walk with the use of an assistive device or accompanied by an adult for the 24 hours.  You may use ice on the affected area for the first 24 hours.  Put ice in a Ziploc bag and cover with a towel and place against area 15 minutes on 15 minutes off.  You may switch to heat after 24 hours.Radiofrequency Lesioning, Care After Refer to this sheet in the next few weeks. These instructions provide you with information about caring for yourself after your procedure. Your health care provider may also give you more specific instructions. Your treatment has been planned according to current medical practices, but problems sometimes occur. Call your health care provider if you have any problems or questions after your procedure. What can I expect after the procedure? After the procedure, it is common to have:  Pain from the burned nerve.  Temporary numbness. Follow these instructions at home:  Take over-the-counter and prescription medicines only as told by your health care provider.  Return to your normal activities as told by your health care provider. Ask your health care provider what activities are safe for you.  Pay close attention to how you feel after the procedure. If you start to have pain, write down when it hurts and how it feels. This will help you and your health care provider to know if you need an additional treatment.  Check your needle insertion site every day for signs of infection. Watch for:  Redness, swelling, or pain.  Fluid, blood, or pus.  Keep all follow-up visits as told by your health care provider. This is important. Contact a health care provider if:  Your  pain does not get better.  You have redness, swelling, or pain at the needle insertion site.  You have fluid, blood, or pus coming from the needle insertion site.  You have a fever. Get help right away if:  You develop sudden, severe pain.  You develop numbness or tingling near the procedure site that does not go away. This information is not intended to replace advice given to you by your health care provider. Make sure you discuss any questions you have with your health care provider. Document Released: 01/14/2011 Document Revised: 10/23/2015 Document Reviewed: 06/24/2014 Elsevier Interactive Patient Education  2017 [place]. Radiofrequency Lesioning Radiofrequency lesioning is a procedure that is performed to relieve pain. The procedure is often used for back, neck, or arm pain. Radiofrequency lesioning involves the use of a machine that creates radio waves to make heat. During the procedure, the heat is applied to the nerve that carries the pain signal. The heat damages the nerve and interferes with the pain signal. Pain relief usually starts about 2 weeks after the procedure and lasts for 6 months to 1 year. Tell a health care provider about:  Any allergies you have.  All medicines you are taking, including vitamins, herbs, eye drops,  creams, and over-the-counter medicines.  Any problems you or family members have had with anesthetic medicines.  Any blood disorders you have.  Any surgeries you have had.  Any medical conditions you have.  Whether you are pregnant or may be pregnant. What are the risks? Generally, this is a safe procedure. However, problems may occur, including:  Pain or soreness at the injection site.  Infection at the injection site.  Damage to nerves or blood vessels. What happens before the procedure?  Ask your health care provider about:  Changing or stopping your regular medicines. This is especially important if you are taking diabetes  medicines or blood thinners.  Taking medicines such as aspirin and ibuprofen. These medicines can thin your blood. Do not take these medicines before your procedure if your health care provider instructs you not to.  Follow instructions from your health care provider about eating or drinking restrictions.  Plan to have someone take you home after the procedure.  If you go home right after the procedure, plan to have someone with you for 24 hours. What happens during the procedure?  You will be given one or more of the following:  A medicine to help you relax (sedative).  A medicine to numb the area (local anesthetic).  You will be awake during the procedure. You will need to be able to talk with the health care provider during the procedure.  With the help of a type of X-ray (fluoroscopy), the health care provider will insert a radiofrequency needle into the area to be treated.  Next, a wire that carries the radio waves (electrode) will be put through the radiofrequency needle. An electrical pulse will be sent through the electrode to verify the correct nerve. You will feel a tingling sensation, and you may have muscle twitching.  Then, the tissue that is around the needle tip will be heated by an electric current that is passed using the radiofrequency machine. This will numb the nerves.  A bandage (dressing) will be put on the insertion area after the procedure is done. The procedure may vary among health care providers and hospitals. What happens after the procedure?  Your blood pressure, heart rate, breathing rate, and blood oxygen level will be monitored often until the medicines you were given have worn off.  Return to your normal activities as directed by your health care provider. This information is not intended to replace advice given to you by your health care provider. Make sure you discuss any questions you have with your health care provider. Document Released:  01/13/2011 Document Revised: 10/23/2015 Document Reviewed: 06/24/2014 Elsevier Interactive Patient Education  2017 [place]  What are the risk, side effects and possible complications? Generally speaking, most procedures are safe.  However, with any procedure there are risks, side effects, and the possibility of complications.  The risks and complications are dependent upon the sites that are lesioned, or the type of nerve block to be performed.  The closer the procedure is to the spine, the more serious the risks are.  Great care is taken when placing the radio frequency needles, block needles or lesioning probes, but sometimes complications can occur. 1. Infection: Any time there is an injection through the skin, there is a risk of infection.  This is why sterile conditions are used for these blocks.  There are four possible types of infection. 1. Localized skin infection. 2. Central Nervous System Infection-This can be in the form of Meningitis,  which can be deadly. 3. Epidural Infections-This can be in the form of an epidural abscess, which can cause pressure inside of the spine, causing compression of the spinal cord with subsequent paralysis. This would require an emergency surgery to decompress, and there are no guarantees that the patient would recover from the paralysis. 4. Discitis-This is an infection of the intervertebral discs.  It occurs in about 1% of discography procedures.  It is difficult to treat and it may lead to surgery.        2. Pain: the needles have to go through skin and soft tissues, will cause soreness.       3. Damage to internal structures:  The nerves to be lesioned may be near blood vessels or    other nerves which can be potentially damaged.       4. Bleeding: Bleeding is more common if the patient is taking blood thinners such as  aspirin, Coumadin, Ticiid, Plavix, etc., or if he/she have some genetic predisposition  such as  hemophilia. Bleeding into the spinal canal can cause compression of the spinal  cord with subsequent paralysis.  This would require an emergency surgery to  decompress and there are no guarantees that the patient would recover from the  paralysis.       5. Pneumothorax:  Puncturing of a lung is a possibility, every time a needle is introduced in  the area of the chest or upper back.  Pneumothorax refers to free air around the  collapsed lung(s), inside of the thoracic cavity (chest cavity).  Another two possible  complications related to a similar event would include: Hemothorax and Chylothorax.   These are variations of the Pneumothorax, where instead of air around the collapsed  lung(s), you may have blood or chyle, respectively.       6. Spinal headaches: They may occur with any procedures in the area of the spine.       7. Persistent CSF (Cerebro-Spinal Fluid) leakage: This is a rare problem, but may occur  with prolonged intrathecal or epidural catheters either due to the formation of a fistulous  track or a dural tear.       8. Nerve damage: By working so close to the spinal cord, there is always a possibility of  nerve damage, which could be as serious as a permanent spinal cord injury with  paralysis.       9. Death:  Although rare, severe deadly allergic reactions known as "Anaphylactic  reaction" can occur to any of the medications used.      10. Worsening of the symptoms:  We can always make thing worse.  What are the chances of something like this happening? Chances of any of this occuring are extremely low.  By statistics, you have more of a chance of getting killed in a motor vehicle accident: while driving to the hospital than any of the above occurring .  Nevertheless, you should be aware that they are possibilities.  In general, it is similar to taking a shower.  Everybody knows that you can slip, hit your head and get killed.  Does that mean that you should not shower again?  Nevertheless  always keep in mind that statistics do not mean anything if you happen to be on the wrong side of them.  Even if a procedure has a 1 (one) in a 1,000,000 (million) chance of going wrong, it you happen to be that one..Also, keep in mind that by statistics,  you have more of a chance of having something go wrong when taking medications.  Who should not have this procedure? If you are on a blood thinning medication (e.g. Coumadin, Plavix, see list of "Blood Thinners"), or if you have an active infection going on, you should not have the procedure.  If you are taking any blood thinners, please inform your physician.  How should I prepare for this procedure?  Do not eat or drink anything at least six hours prior to the procedure.  Bring a driver with you .  It cannot be a taxi.  Come accompanied by an adult that can drive you back, and that is strong enough to help you if your legs get weak or numb from the local anesthetic.  Take all of your medicines the morning of the procedure with just enough water to swallow them.  If you have diabetes, make sure that you are scheduled to have your procedure done first thing in the morning, whenever possible.  If you have diabetes, take only half of your insulin dose and notify our nurse that you have done so as soon as you arrive at the clinic.  If you are diabetic, but only take blood sugar pills (oral hypoglycemic), then do not take them on the morning of your procedure.  You may take them after you have had the procedure.  Do not take aspirin or any aspirin-containing medications, at least eleven (11) days prior to the procedure.  They may prolong bleeding.  Wear loose fitting clothing that may be easy to take off and that you would not mind if it got stained with Betadine or blood.  Do not wear any jewelry or perfume  Remove any nail coloring.  It will interfere with some of our monitoring equipment.  NOTE: Remember that this is not meant to be  interpreted as a complete list of all possible complications.  Unforeseen problems may occur.  BLOOD THINNERS The following drugs contain aspirin or other products, which can cause increased bleeding during surgery and should not be taken for 2 weeks prior to and 1 week after surgery.  If you should need take something for relief of minor pain, you may take acetaminophen which is found in Tylenol,m Datril, Anacin-3 and Panadol. It is not blood thinner. The products listed below are.  Do not take any of the products listed below in addition to any listed on your instruction sheet.  A.P.C or A.P.C with Codeine Codeine Phosphate Capsules #3 Ibuprofen Ridaura  ABC compound Congesprin Imuran rimadil  Advil Cope Indocin Robaxisal  Alka-Seltzer Effervescent Pain Reliever and Antacid Coricidin or Coricidin-D  Indomethacin Rufen  Alka-Seltzer plus Cold Medicine Cosprin Ketoprofen S-A-C Tablets  Anacin Analgesic Tablets or Capsules Coumadin Korlgesic Salflex  Anacin Extra Strength Analgesic tablets or capsules CP-2 Tablets Lanoril Salicylate  Anaprox Cuprimine Capsules Levenox Salocol  Anexsia-D Dalteparin Magan Salsalate  Anodynos Darvon compound Magnesium Salicylate Sine-off  Ansaid Dasin Capsules Magsal Sodium Salicylate  Anturane Depen Capsules Marnal Soma  APF Arthritis pain formula Dewitt's Pills Measurin Stanback  Argesic Dia-Gesic Meclofenamic Sulfinpyrazone  Arthritis Bayer Timed Release Aspirin Diclofenac Meclomen Sulindac  Arthritis pain formula Anacin Dicumarol Medipren Supac  Analgesic (Safety coated) Arthralgen Diffunasal Mefanamic Suprofen  Arthritis Strength Bufferin Dihydrocodeine Mepro Compound Suprol  Arthropan liquid Dopirydamole Methcarbomol with Aspirin Synalgos  ASA tablets/Enseals Disalcid Micrainin Tagament  Ascriptin Doan's Midol Talwin  Ascriptin A/D Dolene Mobidin Tanderil  Ascriptin Extra Strength Dolobid Moblgesic Ticlid  Ascriptin with Codeine Doloprin  or Doloprin  with Codeine Momentum Tolectin  Asperbuf Duoprin Mono-gesic Trendar  Aspergum Duradyne Motrin or Motrin IB Triminicin  Aspirin plain, buffered or enteric coated Durasal Myochrisine Trigesic  Aspirin Suppositories Easprin Nalfon Trillsate  Aspirin with Codeine Ecotrin Regular or Extra Strength Naprosyn Uracel  Atromid-S Efficin Naproxen Ursinus  Auranofin Capsules Elmiron Neocylate Vanquish  Axotal Emagrin Norgesic Verin  Azathioprine Empirin or Empirin with Codeine Normiflo Vitamin E  Azolid Emprazil Nuprin Voltaren  Bayer Aspirin plain, buffered or children's or timed BC Tablets or powders Encaprin Orgaran Warfarin Sodium  Buff-a-Comp Enoxaparin Orudis Zorpin  Buff-a-Comp with Codeine Equegesic Os-Cal-Gesic   Buffaprin Excedrin plain, buffered or Extra Strength Oxalid   Bufferin Arthritis Strength Feldene Oxphenbutazone   Bufferin plain or Extra Strength Feldene Capsules Oxycodone with Aspirin   Bufferin with Codeine Fenoprofen Fenoprofen Pabalate or Pabalate-SF   Buffets II Flogesic Panagesic   Buffinol plain or Extra Strength Florinal or Florinal with Codeine Panwarfarin   Buf-Tabs Flurbiprofen Penicillamine   Butalbital Compound Four-way cold tablets Penicillin   Butazolidin Fragmin Pepto-Bismol   Carbenicillin Geminisyn Percodan   Carna Arthritis Reliever Geopen Persantine   Carprofen Gold's salt Persistin   Chloramphenicol Goody's Phenylbutazone   Chloromycetin Haltrain Piroxlcam   Clmetidine heparin Plaquenil   Cllnoril Hyco-pap Ponstel   Clofibrate Hydroxy chloroquine Propoxyphen         Before stopping any of these medications, be sure to consult the physician who ordered them.  Some, such as Coumadin (Warfarin) are ordered to prevent or treat serious conditions such as "deep thrombosis", "pumonary embolisms", and other heart problems.  The amount of time that you may need off of the medication may also vary with the medication and the reason for which you were taking it.   If you are taking any of these medications, please make sure you notify your pain physician before you undergo any procedures. Post-Procedure instructions Instructions:  Apply ice: Fill a plastic sandwich bag with crushed ice. Cover it with a small towel and apply to injection site. Apply for 15 minutes then remove x 15 minutes. Repeat sequence on day of procedure, until you go to bed. The purpose is to minimize swelling and discomfort after procedure.  Apply heat: Apply heat to procedure site starting the day following the procedure. The purpose is to treat any soreness and discomfort from the procedure.  Food intake: Start with clear liquids (like water) and advance to regular food, as tolerated.   Physical activities: Keep activities to a minimum for the first 8 hours after the procedure.   Driving: If you have received any sedation, you are not allowed to drive for 24 hours after your procedure.  Blood thinner: Restart your blood thinner 6 hours after your procedure. (Only for those taking blood thinners)  Insulin: As soon as you can eat, you may resume your normal dosing schedule. (Only for those taking insulin)  Infection prevention: Keep procedure site clean and dry.  Post-procedure Pain Diary: Extremely important that this be done correctly and accurately. Recorded information will be used to determine the next step in treatment.  Pain evaluated is that of treated area only. Do not include pain from an untreated area.  Complete every hour, on the hour, for the initial 8 hours. Set an alarm to help you do this part accurately.  Do not go to sleep and have it completed later. It will not be accurate.  Follow-up appointment: Keep your follow-up appointment after the procedure. Usually 2 weeks for  most procedures. (6 weeks in the case of radiofrequency.) Bring you pain diary.  Expect:  From numbing medicine (AKA: Local Anesthetics): Numbness or decrease in pain.  Onset: Full  effect within 15 minutes of injected.  Duration: It will depend on the type of local anesthetic used. On the average, 1 to 8 hours.   From steroids: Decrease in swelling or inflammation. Once inflammation is improved, relief of the pain will follow.  Onset of benefits: Depends on the amount of swelling present. The more swelling, the longer it will take for the benefits to be seen.   Duration: Steroids will stay in the system x 2 weeks. Duration of benefits will depend on multiple posibilities including persistent irritating factors.  From procedure: Some discomfort is to be expected once the numbing medicine wears off. This should be minimal if ice and heat are applied as instructed. Call if:  You experience numbness and weakness that gets worse with time, as opposed to wearing off.  New onset bowel or bladder incontinence. (Spinal procedures only)  Emergency Numbers:  [place] hours (Monday - Thursday, 8:00 AM - 4:00 PM) (Friday, 9:00 AM - 12:00 Noon): [phone]  After hours: [phone] _____________________________________________________________________________________________  Initial Gabapentin Titration  Medication used: Gabapentin (Generic Name) or Neurontin (Brand Name) 100 mg tablets/capsules  Reasons to stop increasing the dose:  Reason 1: You get good relief of symptoms, in which case there is no need to increase the daily dose any further.    Reason 2: You develop some side effects, such as sleeping all of the time, difficulty concentrating, or becoming disoriented, in which case you need to go down on the dose, to the prior level, where you were not experiencing any side effects. Stay on that dose longer, to allow more time for your body to get use it, before attempting to increase it again.   Steps: Step 1: Start by taking 1 (one) tablet at bedtime x 7 (seven) days.  Step 2: After being on 1 (one) tablet for 7 (seven) days, then increase it to 2  (two) tablets at bedtime for another 7 (seven) days.  Step 3: Next, after being on 2 (two) tablets at bedtime for 7 (seven) days, then increase it to 3 (three) tablets at bedtime, and stay on that dose until you see your doctor.  Reasons to stop increasing the dose: Reason 1: You get good relief of symptoms, in which case there is no need to increase the daily dose any further.  Reason 2: You develop some side effects, such as sleeping all of the time, difficulty concentrating, or becoming disoriented, in which case you need to go down on the dose, to the prior level, where you were not experiencing any side effects. Stay on that dose longer, to allow more time for your body to get use it, before attempting to increase it again.  Endpoint: Once you have reached the maximum dose you can tolerate without side-effects, contact your physician so as to evaluate the results of the regimen.   Questions: Feel free to contact us for any questions or problems at [phone] [id number] ______________________________________________________________________________________________

## 2016-09-28 ENCOUNTER — Telehealth: Payer: Self-pay | Admitting: *Deleted

## 2016-09-28 NOTE — Telephone Encounter (Signed)
Denies complications post procedure. 

## 2016-11-08 ENCOUNTER — Ambulatory Visit: Payer: BLUE CROSS/BLUE SHIELD | Admitting: Pain Medicine

## 2016-11-09 ENCOUNTER — Encounter: Payer: Self-pay | Admitting: Pain Medicine

## 2016-11-09 ENCOUNTER — Ambulatory Visit: Payer: BLUE CROSS/BLUE SHIELD | Attending: Pain Medicine | Admitting: Pain Medicine

## 2016-11-09 VITALS — BP 183/90 | HR 85 | Temp 98.0°F | Resp 18 | Ht 61.0 in | Wt 102.0 lb

## 2016-11-09 DIAGNOSIS — F1290 Cannabis use, unspecified, uncomplicated: Secondary | ICD-10-CM | POA: Diagnosis not present

## 2016-11-09 DIAGNOSIS — M545 Low back pain: Secondary | ICD-10-CM | POA: Diagnosis present

## 2016-11-09 DIAGNOSIS — Z79891 Long term (current) use of opiate analgesic: Secondary | ICD-10-CM | POA: Diagnosis not present

## 2016-11-09 DIAGNOSIS — M4694 Unspecified inflammatory spondylopathy, thoracic region: Secondary | ICD-10-CM

## 2016-11-09 DIAGNOSIS — N179 Acute kidney failure, unspecified: Secondary | ICD-10-CM | POA: Diagnosis not present

## 2016-11-09 DIAGNOSIS — I10 Essential (primary) hypertension: Secondary | ICD-10-CM | POA: Insufficient documentation

## 2016-11-09 DIAGNOSIS — G8929 Other chronic pain: Secondary | ICD-10-CM | POA: Diagnosis not present

## 2016-11-09 DIAGNOSIS — M47816 Spondylosis without myelopathy or radiculopathy, lumbar region: Secondary | ICD-10-CM

## 2016-11-09 DIAGNOSIS — M47894 Other spondylosis, thoracic region: Secondary | ICD-10-CM

## 2016-11-09 DIAGNOSIS — M1288 Other specific arthropathies, not elsewhere classified, other specified site: Secondary | ICD-10-CM | POA: Insufficient documentation

## 2016-11-09 DIAGNOSIS — M4696 Unspecified inflammatory spondylopathy, lumbar region: Secondary | ICD-10-CM

## 2016-11-09 DIAGNOSIS — M546 Pain in thoracic spine: Secondary | ICD-10-CM | POA: Diagnosis not present

## 2016-11-09 DIAGNOSIS — M47814 Spondylosis without myelopathy or radiculopathy, thoracic region: Secondary | ICD-10-CM | POA: Diagnosis not present

## 2016-11-09 DIAGNOSIS — F17210 Nicotine dependence, cigarettes, uncomplicated: Secondary | ICD-10-CM | POA: Insufficient documentation

## 2016-11-09 NOTE — Progress Notes (Signed)
Patient's Name: Deanna Jacobson  MRN: [id number]  Referring Provider: [facility], [facility]  DOB: 1955/07/16  PCP: [facility], [place]  DOS: 11/09/2016  Note by: Kathlen Brunswick. Dossie Arbour, MD  Service setting: Ambulatory outpatient  Specialty: Interventional Pain Management  Location: [facility] (AMB) Pain Management Facility    Patient type: Established   Primary Reason(s) for Visit: Encounter for post-procedure evaluation of chronic illness with mild to moderate exacerbation CC: Back Pain (low right)  HPI  Deanna Jacobson is a 61 y.o. year old, female patient, who comes today for a post-procedure evaluation. She has Acute renal failure (ARF) ([place]); Long term current use of opiate analgesic; Long term prescription opiate use; Opiate use; Encounter for therapeutic drug level monitoring; Encounter for pain management planning; Chronic mid back pain; Chronic thoracic spine pain (midline); Thoracic facet syndrome (Bilateral); Lumbar facet arthropathy ([place]); Musculoskeletal pain; Neurogenic pain; Osteoarthritis; Chronic pain syndrome; Thoracic spondylosis; Thoracolumbar spondylosis; and Marijuana use on her problem list. Her primarily concern today is the Back Pain (low right)  Pain Assessment: Self-Reported Pain Score: 1 /10             Reported level is compatible with observation.          Deanna Jacobson comes in today for post-procedure evaluation after the treatment done on 09/27/2016.  Further details on both, my assessment(s), as well as the proposed treatment plan, please see below.  Post-Procedure Assessment  09/27/2016 Procedure: T12, L1, L2, &L3Medial Branch RFA, under fluoro and IV sedation. Pre-procedure pain score:  2/10 Post-procedure pain score: 0/10 (100% relief) Influential Factors: BMI: 19.27 kg/m Intra-procedural challenges: None observed Assessment challenges: None detected         Post-procedural side-effects, adverse reactions, or complications: None  reported Reported issues: None  Sedation: Sedation provided. When no sedatives are used, the analgesic levels obtained are directly associated to the effectiveness of the local anesthetics. However, when sedation is provided, the level of analgesia obtained during the initial 1 hour following the intervention, is believed to be the result of a combination of factors. These factors may include, but are not limited to: 1. The effectiveness of the local anesthetics used. 2. The effects of the analgesic(s) and/or anxiolytic(s) used. 3. The degree of discomfort experienced by the patient at the time of the procedure. 4. The patients ability and reliability in recalling and recording the events. 5. The presence and influence of possible secondary gains and/or psychosocial factors. Reported result: Relief experienced during the 1st hour after the procedure: 100 % (Ultra-Short Term Relief) Interpretative annotation: Analgesia during this period is likely to be Local Anesthetic and/or IV Sedative (Analgesic/Anxiolitic) related.          Effects of local anesthetic: The analgesic effects attained during this period are directly associated to the localized infiltration of local anesthetics and therefore cary significant diagnostic value as to the etiological location, or anatomical origin, of the pain. Expected duration of relief is directly dependent on the pharmacodynamics of the local anesthetic used. Long-acting (4-6 hours) anesthetics used.  Reported result: Relief during the next 4 to 6 hour after the procedure: 100 % (Short-Term Relief) Interpretative annotation: Complete relief would suggest area to be the source of the pain.          Long-term benefit: Defined as the period of time past the expected duration of local anesthetics. With the possible exception of prolonged sympathetic blockade from the local anesthetics, benefits during this period are typically attributed to, or associated  with, other  factors such as analgesic sensory neuropraxia, antiinflammatory effects, or beneficial biochemical changes provided by agents other than the local anesthetics Reported result: Extended relief following procedure: 75 % (still having pain when sitting, some in the morning when getting up.) (Long-Term Relief) Interpretative annotation: Good relief. Possible therapeutic success. Benefit could signal adequate RF ablation  Current benefits: Defined as persistent relief that continues at this point in time.   Reported results: Treated area: 75 % Deanna Jacobson reports improvement in function Interpretative annotation: Long-term benefit would suggest adequate RF ablation  Interpretation: Results would suggest a successful therapeutic intervention.          Laboratory Chemistry  Inflammation Markers Lab Results  Component Value Date   CRP 0.5 12/05/2015   ESRSEDRATE 25 12/05/2015   (CRP: Acute Phase) (ESR: Chronic Phase) Renal Function Markers Lab Results  Component Value Date   BUN 13 02/01/2016   CREATININE 1.12 (H) 02/01/2016   GFRAA >60 02/01/2016   GFRNONAA 52 (L) 02/01/2016   Hepatic Function Markers Lab Results  Component Value Date   AST 16 10/28/2015   ALT 11 (L) 10/28/2015   ALBUMIN 3.7 10/28/2015   ALKPHOS 51 10/28/2015   Electrolytes Lab Results  Component Value Date   NA 137 02/01/2016   K 3.6 02/01/2016   CL 107 02/01/2016   CALCIUM 8.1 (L) 02/01/2016   MG 1.8 12/05/2015   Neuropathy Markers Lab Results  Component Value Date   VITAMINB12 354 12/05/2015   Bone Pathology Markers Lab Results  Component Value Date   ALKPHOS 51 10/28/2015   25OHVITD1 59 12/05/2015   25OHVITD2 <1.0 12/05/2015   25OHVITD3 59 12/05/2015   CALCIUM 8.1 (L) 02/01/2016   Coagulation Parameters Lab Results  Component Value Date   PLT 196 02/01/2016   Cardiovascular Markers Lab Results  Component Value Date   HGB 11.0 (L) 02/01/2016   HCT 30.7 (L) 02/01/2016   Note: Lab  results reviewed.  Recent Diagnostic Imaging Review  Dg C-arm 1-60 Min-no Report  Result Date: 09/27/2016 Fluoroscopy was utilized by the requesting physician.  No radiographic interpretation.   Note: Imaging results reviewed.          Meds  The patient has a current medication list which includes the following prescription(s): cyclobenzaprine, gabapentin, lisinopril, meloxicam, tramadol, trazodone, and triamcinolone cream.  Current Outpatient Prescriptions on File Prior to Visit  Medication Sig  . cyclobenzaprine (FLEXERIL) 10 MG tablet Take 1 tablet (10 mg total) by mouth every 8 (eight) hours as needed for muscle spasms.  Marland Kitchen gabapentin (NEURONTIN) 100 MG capsule Take 1-3 capsules (100-300 mg total) by mouth at bedtime.  Marland Kitchen lisinopril (PRINIVIL,ZESTRIL) 20 MG tablet Take 1 tablet (20 mg total) by mouth daily.  . meloxicam (MOBIC) 15 MG tablet Take 1 tablet (15 mg total) by mouth daily.  . traMADol (ULTRAM) 50 MG tablet Take 1-2 tablets (50-100 mg total) by mouth every 6 (six) hours as needed for severe pain.  . traZODone (DESYREL) 100 MG tablet Take 100 mg by mouth at bedtime.    No current facility-administered medications on file prior to visit.    ROS  Constitutional: Denies any fever or chills Gastrointestinal: No reported hemesis, hematochezia, vomiting, or acute GI distress Musculoskeletal: Denies any acute onset joint swelling, redness, loss of ROM, or weakness Neurological: No reported episodes of acute onset apraxia, aphasia, dysarthria, agnosia, amnesia, paralysis, loss of coordination, or loss of consciousness  Allergies  Ms. Olkowski is allergic to codeine.  [place]  Drug: Ms. Beretta  reports that she uses drugs, including Marijuana. Alcohol:  reports that she drinks alcohol. Tobacco:  reports that she has been smoking Cigarettes.  She has been smoking about 1.00 pack per day. She has never used smokeless tobacco. Medical:  has a past medical history of Chronic back  pain and Hypertension. Family: family history includes Cancer in her mother; Hypertension in her father.  Past Surgical History:  Procedure Laterality Date  . BREAST ENHANCEMENT SURGERY     Constitutional Exam  General appearance: Well nourished, well developed, and well hydrated. In no apparent acute distress Vitals:   11/09/16 1027  BP: (!) 183/90  Pulse: 85  Resp: 18  Temp: 98 F (36.7 C)  SpO2: 100%  Weight: 102 lb (46.3 kg)  Height: '5\' 1"'$  (1.549 m)   BMI Assessment: Estimated body mass index is 19.27 kg/m as calculated from the following:   Height as of this encounter: '5\' 1"'$  (1.549 m).   Weight as of this encounter: 102 lb (46.3 kg).  BMI interpretation table: BMI level Category Range association with higher incidence of chronic pain  <18 kg/m2 Underweight   18.5-24.9 kg/m2 Ideal body weight   25-29.9 kg/m2 Overweight Increased incidence by 20%  30-34.9 kg/m2 Obese (Class I) Increased incidence by 68%  35-39.9 kg/m2 Severe obesity (Class II) Increased incidence by 136%  >40 kg/m2 Extreme obesity (Class III) Increased incidence by 254%   BMI Readings from Last 4 Encounters:  11/09/16 19.27 kg/m  09/27/16 19.27 kg/m  08/10/16 22.11 kg/m  06/30/16 22.11 kg/m   Wt Readings from Last 4 Encounters:  11/09/16 102 lb (46.3 kg)  09/27/16 102 lb (46.3 kg)  08/10/16 117 lb (53.1 kg)  06/30/16 117 lb (53.1 kg)  Psych/Mental status: Alert, oriented x 3 (person, place, & time)       Eyes: PERLA Respiratory: No evidence of acute respiratory distress  Cervical Spine Exam  Inspection: No masses, redness, or swelling Alignment: Symmetrical Functional ROM: Unrestricted ROM      Stability: No instability detected Muscle strength & Tone: Functionally intact Sensory: Unimpaired Palpation: No palpable anomalies              Upper Extremity (UE) Exam    Side: Right upper extremity  Side: Left upper extremity  Inspection: No masses, redness, swelling, or asymmetry. No  contractures  Inspection: No masses, redness, swelling, or asymmetry. No contractures  Functional ROM: Unrestricted ROM          Functional ROM: Unrestricted ROM          Muscle strength & Tone: Functionally intact  Muscle strength & Tone: Functionally intact  Sensory: Unimpaired  Sensory: Unimpaired  Palpation: No palpable anomalies              Palpation: No palpable anomalies              Specialized Test(s): Deferred         Specialized Test(s): Deferred          Thoracic Spine Exam  Inspection: No masses, redness, or swelling Alignment: Symmetrical Functional ROM: Improved after treatment Stability: No instability detected Sensory: Unimpaired Muscle strength & Tone: No palpable anomalies  Lumbar Spine Exam  Inspection: No masses, redness, or swelling Alignment: Symmetrical Functional ROM: Improved after treatment      Stability: No instability detected Muscle strength & Tone: Functionally intact Sensory: Unimpaired Palpation: No palpable anomalies       Provocative Tests: Lumbar Hyperextension and rotation test:  evaluation deferred today       Patrick's Maneuver: evaluation deferred today                    Gait & Posture Assessment  Ambulation: Unassisted Gait: Relatively normal for age and body habitus Posture: WNL   Lower Extremity Exam    Side: Right lower extremity  Side: Left lower extremity  Inspection: No masses, redness, swelling, or asymmetry. No contractures  Inspection: No masses, redness, swelling, or asymmetry. No contractures  Functional ROM: Unrestricted ROM          Functional ROM: Unrestricted ROM          Muscle strength & Tone: Functionally intact  Muscle strength & Tone: Functionally intact  Sensory: Unimpaired  Sensory: Unimpaired  Palpation: No palpable anomalies  Palpation: No palpable anomalies   Assessment  Primary Diagnosis & Pertinent Problem List: The primary encounter diagnosis was Chronic thoracic spine pain (midline). Diagnoses of  Thoracic facet syndrome (Bilateral), Lumbar facet arthropathy ([place]), Thoracic spondylosis, and Marijuana use were also pertinent to this visit.  Status Diagnosis  Improved Resolved Controlled 1. Chronic thoracic spine pain (midline)   2. Thoracic facet syndrome (Bilateral)   3. Lumbar facet arthropathy ([place])   4. Thoracic spondylosis   5. Marijuana use     Problems updated and reviewed during this visit: Problem  Marijuana Use   Plan of Care  Pharmacotherapy (Medications Ordered): No orders of the defined types were placed in this encounter.  New Prescriptions   No medications on file   Medications administered today: Ms. Lorino had no medications administered during this visit. Lab-work, procedure(s), and/or referral(s): No orders of the defined types were placed in this encounter.  Imaging and/or referral(s): None  Interventional therapies: Planned, scheduled, and/or pending:   Not at this time.   Considering:   Palliative bilateral T12, L1, L2, & L3 Thoracolumbar Facet MBB   Palliative PRN treatment(s):   Palliative Left T12, L1, L2, & L3Thoracolumbar Facet RFA (Last done on 06/30/16) Palliative Right T12, L1, L2, & L3Thoracolumbar Facet RFA  (Last done on 09/27/16)   Provider-requested follow-up: Return for Med-Mgmt, by NP, in addition, PRN procedure(s), w/ MD.  Future Appointments Date Time Provider [place]  12/22/2016 9:00 AM Vevelyn Francois, NP [facility] None   Primary Care Physician: [facility], [place] [facility] Location: [facility] Note by: Kathlen Brunswick. Dossie Arbour, M.D, DABA, DABAPM, DABPM, DABIPP, FIPP Date: 11/09/2016; Time: 6:44 PM  Patient instructions provided during this appointment: There are no Patient Instructions on file for this visit.

## 2016-12-19 ENCOUNTER — Other Ambulatory Visit: Payer: Self-pay | Admitting: Pain Medicine

## 2016-12-19 DIAGNOSIS — M792 Neuralgia and neuritis, unspecified: Secondary | ICD-10-CM

## 2016-12-22 ENCOUNTER — Encounter: Payer: Self-pay | Admitting: Nurse Practitioner

## 2016-12-22 ENCOUNTER — Ambulatory Visit: Payer: BLUE CROSS/BLUE SHIELD | Attending: Nurse Practitioner | Admitting: Nurse Practitioner

## 2016-12-22 VITALS — BP 154/97 | HR 95 | Temp 97.9°F | Resp 14 | Ht 61.0 in | Wt 105.0 lb

## 2016-12-22 DIAGNOSIS — M792 Neuralgia and neuritis, unspecified: Secondary | ICD-10-CM

## 2016-12-22 DIAGNOSIS — M545 Low back pain: Secondary | ICD-10-CM | POA: Insufficient documentation

## 2016-12-22 DIAGNOSIS — M4696 Unspecified inflammatory spondylopathy, lumbar region: Secondary | ICD-10-CM | POA: Diagnosis not present

## 2016-12-22 DIAGNOSIS — G894 Chronic pain syndrome: Secondary | ICD-10-CM | POA: Diagnosis not present

## 2016-12-22 DIAGNOSIS — I10 Essential (primary) hypertension: Secondary | ICD-10-CM | POA: Diagnosis not present

## 2016-12-22 DIAGNOSIS — M1991 Primary osteoarthritis, unspecified site: Secondary | ICD-10-CM | POA: Diagnosis not present

## 2016-12-22 DIAGNOSIS — M47816 Spondylosis without myelopathy or radiculopathy, lumbar region: Secondary | ICD-10-CM

## 2016-12-22 DIAGNOSIS — Z5181 Encounter for therapeutic drug level monitoring: Secondary | ICD-10-CM | POA: Diagnosis present

## 2016-12-22 DIAGNOSIS — F17210 Nicotine dependence, cigarettes, uncomplicated: Secondary | ICD-10-CM | POA: Insufficient documentation

## 2016-12-22 DIAGNOSIS — Z79891 Long term (current) use of opiate analgesic: Secondary | ICD-10-CM

## 2016-12-22 DIAGNOSIS — Z79899 Other long term (current) drug therapy: Secondary | ICD-10-CM | POA: Diagnosis not present

## 2016-12-22 DIAGNOSIS — M546 Pain in thoracic spine: Secondary | ICD-10-CM | POA: Diagnosis not present

## 2016-12-22 DIAGNOSIS — M791 Myalgia: Secondary | ICD-10-CM | POA: Insufficient documentation

## 2016-12-22 DIAGNOSIS — Z809 Family history of malignant neoplasm, unspecified: Secondary | ICD-10-CM | POA: Insufficient documentation

## 2016-12-22 DIAGNOSIS — M150 Primary generalized (osteo)arthritis: Secondary | ICD-10-CM | POA: Diagnosis not present

## 2016-12-22 DIAGNOSIS — M7918 Myalgia, other site: Secondary | ICD-10-CM

## 2016-12-22 DIAGNOSIS — M159 Polyosteoarthritis, unspecified: Secondary | ICD-10-CM

## 2016-12-22 MED ORDER — TRAMADOL HCL 50 MG PO TABS: 50.0000 mg | ORAL_TABLET | Freq: Four times a day (QID) | ORAL | 2 refills | Status: DC | PRN

## 2016-12-22 MED ORDER — GABAPENTIN 100 MG PO CAPS: 100.0000 mg | ORAL_CAPSULE | Freq: Every day | ORAL | 2 refills | Status: DC

## 2016-12-22 MED ORDER — MELOXICAM 15 MG PO TABS: 15.0000 mg | ORAL_TABLET | Freq: Every day | ORAL | 2 refills | Status: DC

## 2016-12-22 MED ORDER — CYCLOBENZAPRINE HCL 10 MG PO TABS: 10.0000 mg | ORAL_TABLET | Freq: Three times a day (TID) | ORAL | 2 refills | Status: DC | PRN

## 2016-12-22 NOTE — Progress Notes (Signed)
Nursing Pain Medication Assessment:  Safety precautions to be maintained throughout the outpatient stay will include: orient to surroundings, keep bed in low position, maintain call bell within reach at all times, provide assistance with transfer out of bed and ambulation.  Medication Inspection Compliance: Pill count conducted under aseptic conditions, in front of the patient. Neither the pills nor the bottle was removed from the patient's sight at any time. Once count was completed pills were immediately returned to the patient in their original bottle.  Medication: Tramadol (Ultram) Pill/Patch Count: 2 of 240 pills remain Pill/Patch Appearance: Markings consistent with prescribed medication Bottle Appearance: Standard pharmacy container. Clearly labeled. Filled Date: 04/30 / 2018 Last Medication intake:  Today

## 2016-12-22 NOTE — Patient Instructions (Signed)
  ____________________________________________________________________________________________  Appointment Policy Summary  It is our goal and responsibility to provide the medical community with assistance in the evaluation and management of patients with chronic pain. Unfortunately our resources are limited. Because we do not have an unlimited amount of time, or available appointments, we are required to closely monitor and manage their use. The following rules exist to maximize their use:  Patient's responsibilities: 1. Punctuality:  At what time should I arrive? You should be physically present in our office 30 minutes before your scheduled appointment. Your scheduled appointment is with your assigned healthcare provider. However, it takes 5-10 minutes to be "checked-in", and another 15 minutes for the nurses to do the admission. If you arrive to our office at the time you were given for your appointment, you will end up being at least 20-25 minutes late to your appointment with the provider. 2. Tardiness:  What happens if I arrive only a few minutes after my scheduled appointment time? You will need to reschedule your appointment. The cutoff is your appointment time. This is why it is so important that you arrive at least 30 minutes before that appointment. If you have an appointment scheduled for 10:00 AM and you arrive at 10:01, you will be required to reschedule your appointment.  3. Plan ahead:  Always assume that you will encounter traffic on your way in. Plan for it. If you are dependent on a driver, make sure they understand these rules and the need to arrive early. 4. Other appointments and responsibilities:  Avoid scheduling any other appointments before or after your pain clinic appointments.  5. Be prepared:  Write down everything that you need to discuss with your healthcare provider and give this information to the admitting nurse. Write down the medications that you will need  refilled. Bring your pills and bottles (even the empty ones), to all of your appointments, except for those where a procedure is scheduled. 6. No children or pets:  Find someone to take care of them. It is not appropriate to bring them in. 7. Scheduling changes:  We request "advanced notification" of any changes or cancellations. 8. Advanced notification:  Defined as a time period of more than 24 hours prior to the originally scheduled appointment. This allows for the appointment to be offered to other patients. 9. Rescheduling:  When a visit is rescheduled, it will require the cancellation of the original appointment. For this reason they both fall within the category of "Cancellations".  10. Cancellations:  They require advanced notification. Any cancellation less than 24 hours before the  appointment will be recorded as a "No Show". 11. No Show:  Defined as an unkept appointment where the patient failed to notify or declare to the practice their intention or inability to keep the appointment.  Corrective process for repeat offenders:  1. Tardiness: Three (3) episodes of rescheduling due to late arrivals will be recorded as one (1) "No Show". 2. Cancellation or reschedule: Three (3) cancellations or rescheduling will be recorded as one (1) "No Show". 3. "No Shows": Three (3) "No Shows" within a 12 month period will result in discharge from the practice.  ____________________________________________________________________________________________

## 2016-12-22 NOTE — Progress Notes (Addendum)
Patient's Name: Deanna Jacobson  MRN: [id number]  Referring Provider: Center, [facility]  DOB: [birth year]  PCP: [facility], [place]  DOS: 12/22/2016  Note by: Vevelyn Francois NP  Service setting: Ambulatory outpatient  Specialty: Interventional Pain Management  Location: [facility] (AMB) Pain Management Facility    Patient type: Established    Primary Reason(s) for Visit: Encounter for prescription drug management. (Level of risk: moderate)  CC: Back Pain (lower)  HPI  [name] is a 61 y.o. year old, female patient, who comes today for a medication management evaluation. She has Acute renal failure (ARF) ([place]); Long term current use of opiate analgesic; Long term prescription opiate use; Opiate use; Encounter for therapeutic drug level monitoring; Encounter for pain management planning; Chronic mid back pain; Chronic thoracic spine pain (midline); Thoracic facet syndrome (Bilateral); Lumbar facet arthropathy ([place]); Musculoskeletal pain; Neurogenic pain; Osteoarthritis; Chronic pain syndrome; Thoracic spondylosis; Thoracolumbar spondylosis; and Marijuana use on her problem list. Her primarily concern today is the Back Pain (lower)  Pain Assessment: Location: Lower Back Radiating: n/a Onset: More than a month ago Duration: Chronic pain Quality: Aching Severity: 6 /10 (self-reported pain score)  Note: Reported level is compatible with observation.                   Effect on ADL:   Timing: Intermittent Modifying factors: ice  [name] was last scheduled for an appointment on Visit date not found for medication management. During today's appointment we reviewed [name]'s chronic pain status, as well as her outpatient medication regimen.  She admits that her step brother was found dead on her last visit. She states that the RFA was not as successful as she thought. She states that it did stop the stinging and burning. She is using ice pack. She states that  she is sitting more lately and this seems to make the pain worse. 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 She feels like working will help her more than sitting.   The patient  reports that she uses drugs, including Marijuana. Her body mass index is 19.84 kg/m.  Further details on both, my assessment(s), as well as the proposed treatment plan, please see below.  Controlled Substance Pharmacotherapy Assessment REMS (Risk Evaluation and Mitigation Strategy)  Analgesic:Tramadol 50 mg 1 every 6 hours (200 mg/day of tramadol) MME/day:20 mg/day Landis Martins, RN  12/22/2016  2:27 PM  Signed Nursing Pain Medication Assessment:  Safety precautions to be maintained throughout the outpatient stay will include: orient to surroundings, keep bed in low position, maintain call bell within reach at all times, provide assistance with transfer out of bed and ambulation.  Medication Inspection Compliance: Pill count conducted under aseptic conditions, in front of the patient. Neither the pills nor the bottle was removed from the patient's sight at any time. Once count was completed pills were immediately returned to the patient in their original bottle.  Medication: Tramadol (Ultram) Pill/Patch Count: 2 of 240 pills remain Pill/Patch Appearance: Markings consistent with prescribed medication Bottle Appearance: Standard pharmacy container. Clearly labeled. Filled Date: 04/30 / 2018 Last Medication intake:  Today   Pharmacokinetics: Liberation and absorption (onset of action): WNL Distribution (time to peak effect): WNL Metabolism and excretion (duration of action): WNL         Pharmacodynamics: Desired effects: Analgesia: [name] reports >50% benefit. Functional ability: Patient reports that medication allows her to accomplish basic ADLs Clinically meaningful improvement in  function (CMIF): Sustained CMIF goals met Perceived effectiveness: Described as relatively effective,  allowing for increase in activities of daily living (ADL) Undesirable effects: Side-effects or Adverse reactions: None reported Monitoring: [place] PMP: Online review of the past 48-monthperiod conducted. Compliant with practice rules and regulations List of all UDS test(s) done:  Lab Results  Component Value Date   TOXASSSELUR FINAL 08/10/2016   TOXASSSELUR FINAL 01/27/2016   SUMMARY FINAL 11/20/2015   Last UDS on record: ToxAssure Select 13  Date Value Ref Range Status  08/10/2016 FINAL  Final    Comment:    ==================================================================== TOXASSURE SELECT 13 (MW) ==================================================================== Test                             Result       Flag       Units Drug Present not Declared for Prescription Verification   Carboxy-THC                    44           UNEXPECTED ng/mg creat    Carboxy-THC is a metabolite of tetrahydrocannabinol  (THC).    Source of [facility] most commonly illicit, but THC is also present    in a scheduled prescription medication. Drug Absent but Declared for Prescription Verification   Tramadol                       Not Detected UNEXPECTED ==================================================================== Test                      Result    Flag   Units      Ref Range   Creatinine              57               mg/dL      >=20 ==================================================================== Declared Medications:  The flagging and interpretation on this report are based on the  following declared medications.  Unexpected results may arise from  inaccuracies in the declared medications.  **Note: The testing scope of this panel includes these medications:  Tramadol (Ultram)  **Note: The testing scope of this panel does not include following  reported medications:  Cyclobenzaprine (Flexeril)  Gabapentin (Neurontin)  Lisinopril  Meloxicam (Mobic)  Trazodone  (Desyrel) ==================================================================== For clinical consultation, please call [phone] ====================================================================    Summary  Date Value Ref Range Status  11/20/2015 FINAL  Final    Comment:    ==================================================================== TOXASSURE COMP DRUG ANALYSIS,UR ==================================================================== Test                             Result       Flag       Units Drug Present and Declared for Prescription Verification   Tramadol                       PRESENT      EXPECTED   O-Desmethyltramadol            PRESENT      EXPECTED   N-Desmethyltramadol            PRESENT      EXPECTED    Source of tramadol is a prescription medication.    O-desmethyltramadol and N-desmethyltramadol are expected    metabolites of  tramadol.   Pregabalin                     PRESENT      EXPECTED   Cyclobenzaprine                PRESENT      EXPECTED   Trazodone                      PRESENT      EXPECTED   1,3 chlorophenyl piperazine    PRESENT      EXPECTED    1,3-chlorophenyl piperazine is an expected metabolite of    trazodone. Drug Absent but Declared for Prescription Verification   Diazepam                       Not Detected UNEXPECTED ng/mg creat   Ibuprofen                      Not Detected UNEXPECTED    Ibuprofen, as indicated in the declared medication list, is not    always detected even when used as directed. ==================================================================== Test                      Result    Flag   Units      Ref Range   Creatinine              42               mg/dL      >=20 ==================================================================== Declared Medications:  The flagging and interpretation on this report are based on the  following declared medications.  Unexpected results may arise from  inaccuracies in the  declared medications.  **Note: The testing scope of this panel includes these medications:  Cyclobenzaprine (Flexeril)  Diazepam (Valium)  Pregabalin (Lyrica)  Tramadol (Ultram)  Trazodone (Desyrel)  **Note: The testing scope of this panel does not include small to  moderate amounts of these reported medications:  Ibuprofen  **Note: The testing scope of this panel does not include following  reported medications:  Hydrochlorothiazide (Hydrodiuril)  Hydrochlorothiazide (Lisinopril-HCTZ)  Lisinopril  Lisinopril (Lisinopril-HCTZ)  Meloxicam (Mobic)  Prednisone (Deltasone)  Terbinafine (Lamisil) ==================================================================== For clinical consultation, please call [phone]. ====================================================================    UDS interpretation: Compliant          Medication Assessment Form: Reviewed. Patient indicates being compliant with therapy Treatment compliance: Compliant Risk Assessment Profile: Aberrant behavior: See prior evaluations. None observed or detected today Comorbid factors increasing risk of overdose: See prior notes. No additional risks detected today Risk of substance use disorder (SUD): Low Opioid Risk Tool (ORT) Total Score: 3  Interpretation Table:  Score <3 = Low Risk for SUD  Score between 4-7 = Moderate Risk for SUD  Score >8 = High Risk for Opioid Abuse   Risk Mitigation Strategies:  Patient Counseling: Covered Patient-Prescriber Agreement (PPA): Present and active  Notification to other healthcare providers: Done  Pharmacologic Plan: No change in therapy, at this time  Laboratory Chemistry  Inflammation Markers (CRP: Acute Phase) (ESR: Chronic Phase) Lab Results  Component Value Date   CRP 0.5 12/05/2015   ESRSEDRATE 25 12/05/2015                 Renal Function Markers Lab Results  Component Value Date   BUN 13 02/01/2016   CREATININE 1.12 (H) 02/01/2016   GFRAA >  60  02/01/2016   GFRNONAA 52 (L) 02/01/2016                 Hepatic Function Markers Lab Results  Component Value Date   AST 16 10/28/2015   ALT 11 (L) 10/28/2015   ALBUMIN 3.7 10/28/2015   ALKPHOS 51 10/28/2015                 Electrolytes Lab Results  Component Value Date   NA 137 02/01/2016   K 3.6 02/01/2016   CL 107 02/01/2016   CALCIUM 8.1 (L) 02/01/2016   MG 1.8 12/05/2015                 Neuropathy Markers Lab Results  Component Value Date   VITAMINB12 354 12/05/2015                 Bone Pathology Markers Lab Results  Component Value Date   ALKPHOS 51 10/28/2015   25OHVITD1 59 12/05/2015   25OHVITD2 <1.0 12/05/2015   25OHVITD3 59 12/05/2015   CALCIUM 8.1 (L) 02/01/2016                 Coagulation Parameters Lab Results  Component Value Date   PLT 196 02/01/2016                 Cardiovascular Markers Lab Results  Component Value Date   HGB 11.0 (L) 02/01/2016   HCT 30.7 (L) 02/01/2016                 Note: Lab results reviewed.  Recent Diagnostic Imaging Review  Dg C-arm 1-60 Min-no Report  Result Date: 09/27/2016 Fluoroscopy was utilized by the requesting physician.  No radiographic interpretation.   Note: Imaging results reviewed.          Meds   Current Meds  Medication Sig  . cyclobenzaprine (FLEXERIL) 10 MG tablet Take 1 tablet (10 mg total) by mouth every 8 (eight) hours as needed for muscle spasms.  Marland Kitchen gabapentin (NEURONTIN) 100 MG capsule Take 1-3 capsules (100-300 mg total) by mouth at bedtime.  Marland Kitchen lisinopril (PRINIVIL,ZESTRIL) 20 MG tablet Take 1 tablet (20 mg total) by mouth daily.  . meloxicam (MOBIC) 15 MG tablet Take 1 tablet (15 mg total) by mouth daily.  Derrill Memo ON 12/26/2016] traMADol (ULTRAM) 50 MG tablet Take 1-2 tablets (50-100 mg total) by mouth every 6 (six) hours as needed for severe pain.  . traZODone (DESYREL) 100 MG tablet Take 100 mg by mouth at bedtime.   . triamcinolone cream (KENALOG) 0.1 % APPLY TO AFFECTED AREA  TWICE DAILY FOR UP TO 2 WEEKS  . [DISCONTINUED] cyclobenzaprine (FLEXERIL) 10 MG tablet Take 1 tablet (10 mg total) by mouth every 8 (eight) hours as needed for muscle spasms.  . [DISCONTINUED] gabapentin (NEURONTIN) 100 MG capsule Take 1-3 capsules (100-300 mg total) by mouth at bedtime.  . [DISCONTINUED] meloxicam (MOBIC) 15 MG tablet Take 1 tablet (15 mg total) by mouth daily.  . [DISCONTINUED] traMADol (ULTRAM) 50 MG tablet Take 1-2 tablets (50-100 mg total) by mouth every 6 (six) hours as needed for severe pain.    ROS  Constitutional: Denies any fever or chills Gastrointestinal: No reported hemesis, hematochezia, vomiting, or acute GI distress Musculoskeletal: Denies any acute onset joint swelling, redness, loss of ROM, or weakness Neurological: No reported episodes of acute onset apraxia, aphasia, dysarthria, agnosia, amnesia, paralysis, loss of coordination, or loss of consciousness  Allergies  Ms. [place] is allergic to codeine.  [place]  Drug: Ms. Ranum  reports that she uses drugs, including Marijuana. Alcohol:  reports that she drinks alcohol. Tobacco:  reports that she has been smoking Cigarettes.  She has been smoking about 1.00 pack per day. She has never used smokeless tobacco. Medical:  has a past medical history of Chronic back pain and Hypertension. Surgical: Ms. Holsonback  has a past surgical history that includes Breast enhancement surgery. Family: family history includes Cancer in her mother; Hypertension in her father.  Constitutional Exam  General appearance: Well nourished, well developed, and well hydrated. In no apparent acute distress Vitals:   12/22/16 0917  BP: (!) 154/97  Pulse: 95  Resp: 14  Temp: 97.9 F (36.6 C)  TempSrc: Oral  SpO2: 100%  Weight: 105 lb (47.6 kg)  Height: '5\' 1"'  (1.549 m)   BMI Assessment: Estimated body mass index is 19.84 kg/m as calculated from the following:   Height as of this encounter: '5\' 1"'  (1.549 m).   Weight  as of this encounter: 105 lb (47.6 kg).  BMI interpretation table: BMI level Category Range association with higher incidence of chronic pain  <18 kg/m2 Underweight   18.5-24.9 kg/m2 Ideal body weight   25-29.9 kg/m2 Overweight Increased incidence by 20%  30-34.9 kg/m2 Obese (Class I) Increased incidence by 68%  35-39.9 kg/m2 Severe obesity (Class II) Increased incidence by 136%  >40 kg/m2 Extreme obesity (Class III) Increased incidence by 254%   BMI Readings from Last 4 Encounters:  12/22/16 19.84 kg/m  11/09/16 19.27 kg/m  09/27/16 19.27 kg/m  08/10/16 22.11 kg/m   Wt Readings from Last 4 Encounters:  12/22/16 105 lb (47.6 kg)  11/09/16 102 lb (46.3 kg)  09/27/16 102 lb (46.3 kg)  08/10/16 117 lb (53.1 kg)  Psych/Mental status: Alert, oriented x 3 (person, place, & time)       Eyes: PERLA Respiratory: No evidence of acute respiratory distress  Cervical Spine Exam  Inspection: No masses, redness, or swelling Alignment: Symmetrical Functional ROM: Unrestricted ROM      Stability: No instability detected Muscle strength & Tone: Functionally intact Sensory: Unimpaired Palpation: No palpable anomalies              Upper Extremity (UE) Exam    Side: Right upper extremity  Side: Left upper extremity  Inspection: No masses, redness, swelling, or asymmetry. No contractures  Inspection: No masses, redness, swelling, or asymmetry. No contractures  Functional ROM: Unrestricted ROM          Functional ROM: Unrestricted ROM          Muscle strength & Tone: Functionally intact  Muscle strength & Tone: Functionally intact  Sensory: Unimpaired  Sensory: Unimpaired  Palpation: No palpable anomalies              Palpation: No palpable anomalies              Specialized Test(s): Deferred         Specialized Test(s): Deferred          Thoracic Spine Exam  Inspection: No masses, redness, or swelling Alignment: Symmetrical Functional ROM: Unrestricted ROM Stability: No instability  detected Sensory: Unimpaired Muscle strength & Tone: No palpable anomalies  Lumbar Spine Exam  Inspection: No masses, redness, or swelling Alignment: Symmetrical Functional ROM: Unrestricted ROM      Stability: No instability detected Muscle strength & Tone: Functionally intact Sensory: Unimpaired Palpation: Complains of area being tender to palpation       Provocative Tests:  Lumbar Hyperextension and rotation test: evaluation deferred today       Lumbar Lateral bending test: evaluation deferred today       Patrick's Maneuver: evaluation deferred today                    Gait & Posture Assessment  Ambulation: Unassisted Gait: Relatively normal for age and body habitus Posture: WNL   Lower Extremity Exam    Side: Right lower extremity  Side: Left lower extremity  Inspection: No masses, redness, swelling, or asymmetry. No contractures  Inspection: No masses, redness, swelling, or asymmetry. No contractures  Functional ROM: Unrestricted ROM          Functional ROM: Unrestricted ROM          Muscle strength & Tone: Functionally intact  Muscle strength & Tone: Functionally intact  Sensory: Unimpaired  Sensory: Unimpaired  Palpation: No palpable anomalies  Palpation: No palpable anomalies   Assessment  Primary Diagnosis & Pertinent Problem List: The primary encounter diagnosis was Lumbar facet arthropathy ([place]). Diagnoses of Primary osteoarthritis involving multiple joints, Musculoskeletal pain, Neurogenic pain, Chronic pain syndrome, and Long term current use of opiate analgesic were also pertinent to this visit.  Status Diagnosis  Controlled Controlled Controlled 1. Lumbar facet arthropathy ([place])   2. Primary osteoarthritis involving multiple joints   3. Musculoskeletal pain   4. Neurogenic pain   5. Chronic pain syndrome   6. Long term current use of opiate analgesic     Problems updated and reviewed during this visit: No problems updated. Plan of Care  Pharmacotherapy  (Medications Ordered): Meds ordered this encounter  Medications  . cyclobenzaprine (FLEXERIL) 10 MG tablet    Sig: Take 1 tablet (10 mg total) by mouth every 8 (eight) hours as needed for muscle spasms.    Dispense:  90 tablet    Refill:  2    Do not add this medication to the electronic "Automatic Refill" notification system. Patient may have prescription filled one day early if pharmacy is closed on scheduled refill date.    Order Specific Question:   Supervising Provider    Answer:   Milinda Pointer [phone]  . gabapentin (NEURONTIN) 100 MG capsule    Sig: Take 1-3 capsules (100-300 mg total) by mouth at bedtime.    Dispense:  90 capsule    Refill:  2    Do not place this medication, or any other prescription from our practice, on "Automatic Refill". Patient may have prescription filled one day early if pharmacy is closed on scheduled refill date.    Order Specific Question:   Supervising Provider    Answer:   Milinda Pointer [phone]  . meloxicam (MOBIC) 15 MG tablet    Sig: Take 1 tablet (15 mg total) by mouth daily.    Dispense:  30 tablet    Refill:  2    Do not add this medication to the electronic "Automatic Refill" notification system. Patient may have prescription filled one day early if pharmacy is closed on scheduled refill date.    Order Specific Question:   Supervising Provider    Answer:   Milinda Pointer [phone]  . traMADol (ULTRAM) 50 MG tablet    Sig: Take 1-2 tablets (50-100 mg total) by mouth every 6 (six) hours as needed for severe pain.    Dispense:  240 tablet    Refill:  2    Do not add this medication to the electronic "Automatic Refill" notification system. Patient  may have prescription filled one day early if pharmacy is closed on scheduled refill date. Do not fill until: 12/26/2016 To last until:03/26/17    Order Specific Question:   Supervising Provider    Answer:   Milinda Pointer [phone]   New Prescriptions   No medications on file    Medications administered today: Ms. Bellevue had no medications administered during this visit. Lab-work, procedure(s), and/or referral(s): Orders Placed This Encounter  Procedures  . ToxASSURE Select 13 (MW), Urine  . Comprehensive metabolic panel  . Magnesium  . 25-Hydroxyvitamin D Lcms D2+D3  . Vitamin B12  . CBC  . C-reactive protein  . Sedimentation rate  PT unable to have labs completed on apt date came in today to have completed Imaging and/or referral(s): None  Interventional therapies: Planned, scheduled, and/or pending:   Right T12, L1, L2, & L3Medial Branch Level(s)RFA   Considering:   Palliative Left T12, L1, L2, & L3Medial Branch Level(s)RFA (Last done on 06/30/16)   Palliative PRN treatment(s):   Palliative T12, L1, L2, & L3Medial Branch Level(s)Block Palliative T12, L1, L2, & L3Medial Branch Level(s)radiofrequency abla   Provider-requested follow-up: Return in about 3 months (around 03/24/2017) for MedMgmt.  Future Appointments Date Time Provider [place]  03/24/2017 9:00 AM Vevelyn Francois, NP [facility] None   Primary Care Physician: Center, [place] Location: [facility] Outpatient Pain Management Facility Note by: Vevelyn Francois NP Date: 12/22/2016; Time: 1:26 PM  Pain Score Disclaimer: We use the NRS-11 scale. This is a self-reported, subjective measurement of pain severity with only modest accuracy. It is used primarily to identify changes within a particular patient. It must be understood that outpatient pain scales are significantly less accurate that those used for research, where they can be applied under ideal controlled circumstances with minimal exposure to variables. In reality, the score is likely to be a combination of pain intensity and pain affect, where pain affect describes the degree of emotional arousal or changes in action readiness caused by the sensory experience of pain. Factors such as social and  work situation, setting, emotional state, anxiety levels, expectation, and prior pain experience may influence pain perception and show large inter-individual differences that may also be affected by time variables.  Patient instructions provided during this appointment: Patient Instructions   ____________________________________________________________________________________________  Appointment Policy Summary  It is our goal and responsibility to provide the medical community with assistance in the evaluation and management of patients with chronic pain. Unfortunately our resources are limited. Because we do not have an unlimited amount of time, or available appointments, we are required to closely monitor and manage their use. The following rules exist to maximize their use:  Patient's responsibilities: 1. Punctuality:  At what time should I arrive? You should be physically present in our office 30 minutes before your scheduled appointment. Your scheduled appointment is with your assigned healthcare provider. However, it takes 5-10 minutes to be "checked-in", and another 15 minutes for the nurses to do the admission. If you arrive to our office at the time you were given for your appointment, you will end up being at least 20-25 minutes late to your appointment with the provider. 2. Tardiness:  What happens if I arrive only a few minutes after my scheduled appointment time? You will need to reschedule your appointment. The cutoff is your appointment time. This is why it is so important that you arrive at least 30 minutes before that appointment. If you have an appointment scheduled  for 10:00 AM and you arrive at 10:01, you will be required to reschedule your appointment.  3. Plan ahead:  Always assume that you will encounter traffic on your way in. Plan for it. If you are dependent on a driver, make sure they understand these rules and the need to arrive early. 4. Other appointments and  responsibilities:  Avoid scheduling any other appointments before or after your pain clinic appointments.  5. Be prepared:  Write down everything that you need to discuss with your healthcare provider and give this information to the admitting nurse. Write down the medications that you will need refilled. Bring your pills and bottles (even the empty ones), to all of your appointments, except for those where a procedure is scheduled. 6. No children or pets:  Find someone to take care of them. It is not appropriate to bring them in. 7. Scheduling changes:  We request "advanced notification" of any changes or cancellations. 8. Advanced notification:  Defined as a time period of more than 24 hours prior to the originally scheduled appointment. This allows for the appointment to be offered to other patients. 9. Rescheduling:  When a visit is rescheduled, it will require the cancellation of the original appointment. For this reason they both fall within the category of "Cancellations".  10. Cancellations:  They require advanced notification. Any cancellation less than 24 hours before the  appointment will be recorded as a "No Show". 11. No Show:  Defined as an unkept appointment where the patient failed to notify or declare to the practice their intention or inability to keep the appointment.  Corrective process for repeat offenders:  1. Tardiness: Three (3) episodes of rescheduling due to late arrivals will be recorded as one (1) "No Show". 2. Cancellation or reschedule: Three (3) cancellations or rescheduling will be recorded as one (1) "No Show". 3. "No Shows": Three (3) "No Shows" within a 12 month period will result in discharge from the practice.  ____________________________________________________________________________________________

## 2016-12-23 ENCOUNTER — Other Ambulatory Visit: Payer: Self-pay | Admitting: Nurse Practitioner

## 2016-12-23 DIAGNOSIS — Z79891 Long term (current) use of opiate analgesic: Secondary | ICD-10-CM

## 2016-12-23 NOTE — Addendum Note (Signed)
Addended by: Vevelyn Francois on: 12/23/2016 01:27 PM   Modules accepted: Orders

## 2016-12-30 LAB — TOXASSURE SELECT 13 (MW), URINE

## 2016-12-31 LAB — C-REACTIVE PROTEIN: CRP: 0.4 mg/L (ref 0.0–4.9)

## 2016-12-31 LAB — 25-HYDROXYVITAMIN D LCMS D2+D3
25-HYDROXY, VITAMIN D-2: 3.2 ng/mL
25-HYDROXY, VITAMIN D-3: 46 ng/mL
25-HYDROXY, VITAMIN D: 49 ng/mL

## 2016-12-31 LAB — CBC WITH DIFFERENTIAL/PLATELET
BASOS ABS: 0 10*3/uL (ref 0.0–0.2)
Basos: 1 %
EOS (ABSOLUTE): 0.3 10*3/uL (ref 0.0–0.4)
EOS: 4 %
Hematocrit: 39.9 % (ref 34.0–46.6)
Hemoglobin: 12.8 g/dL (ref 11.1–15.9)
IMMATURE GRANULOCYTES: 0 %
Immature Grans (Abs): 0 10*3/uL (ref 0.0–0.1)
LYMPHS ABS: 1.5 10*3/uL (ref 0.7–3.1)
Lymphs: 24 %
MCH: 29.9 pg (ref 26.6–33.0)
MCHC: 32.1 g/dL (ref 31.5–35.7)
MCV: 93 fL (ref 79–97)
MONOS ABS: 0.5 10*3/uL (ref 0.1–0.9)
Monocytes: 8 %
NEUTROS PCT: 63 %
Neutrophils Absolute: 4.2 10*3/uL (ref 1.4–7.0)
PLATELETS: 325 10*3/uL (ref 150–379)
RBC: 4.28 x10E6/uL (ref 3.77–5.28)
RDW: 13.4 % (ref 12.3–15.4)
WBC: 6.5 10*3/uL (ref 3.4–10.8)

## 2016-12-31 LAB — MAGNESIUM: Magnesium: 1.9 mg/dL (ref 1.6–2.3)

## 2016-12-31 LAB — SEDIMENTATION RATE: Sed Rate: 7 mm/hr (ref 0–40)

## 2016-12-31 LAB — VITAMIN B12: Vitamin B-12: 734 pg/mL (ref 232–1245)

## 2017-01-11 NOTE — Progress Notes (Signed)
  THC-DISCLOSED NOTE: This forensic urine drug screen (UDS) test was conducted using a state-of-the-art ultra high performance liquid chromatography and mass spectrometry system (UPLC/MS-MS), the most sophisticated and accurate method available. UPLC/MS-MS is 1,000 times more precise and accurate than standard gas chromatography and mass spectrometry (GC/MS). This system can analyze 26 drug categories and 180 drug compounds.  This patient's UDT results were (+) for Cannabinoids. The patient has been informed of our "Zero Tolerance" towards the use of any illegal substances. Because the patient did disclose the use of cannabinoids at the time of collection, we may not discontinue the posibility of using controlled substances, but we will limit them contingent on the patient's discontinuation of all illegal substances. We will also limit the interval between refills to no more that 30 days, with frequent unanounced UDTs. As long as the quantitative levels are seen to be decreasing, we may continue therapy. The instant they increase or remain the same, we will discontinue the opioid management option.

## 2017-02-02 ENCOUNTER — Ambulatory Visit: Payer: BLUE CROSS/BLUE SHIELD | Attending: Nurse Practitioner | Admitting: Nurse Practitioner

## 2017-02-02 ENCOUNTER — Other Ambulatory Visit
Admission: RE | Admit: 2017-02-02 | Discharge: 2017-02-02 | Disposition: A | Discharge status: Home / Self Care | Payer: BLUE CROSS/BLUE SHIELD | Source: Ambulatory Visit | Attending: Nurse Practitioner | Admitting: Nurse Practitioner

## 2017-02-02 ENCOUNTER — Encounter: Payer: Self-pay | Admitting: Nurse Practitioner

## 2017-02-02 ENCOUNTER — Telehealth: Payer: Self-pay | Admitting: *Deleted

## 2017-02-02 VITALS — BP 209/90 | HR 84 | Temp 98.4°F | Resp 16 | Ht 61.0 in | Wt 101.8 lb

## 2017-02-02 DIAGNOSIS — G8929 Other chronic pain: Secondary | ICD-10-CM | POA: Diagnosis not present

## 2017-02-02 DIAGNOSIS — M546 Pain in thoracic spine: Secondary | ICD-10-CM | POA: Insufficient documentation

## 2017-02-02 DIAGNOSIS — Z79891 Long term (current) use of opiate analgesic: Secondary | ICD-10-CM | POA: Diagnosis not present

## 2017-02-02 DIAGNOSIS — G894 Chronic pain syndrome: Secondary | ICD-10-CM | POA: Insufficient documentation

## 2017-02-02 DIAGNOSIS — F1190 Opioid use, unspecified, uncomplicated: Secondary | ICD-10-CM | POA: Diagnosis not present

## 2017-02-02 DIAGNOSIS — M791 Myalgia: Secondary | ICD-10-CM | POA: Diagnosis not present

## 2017-02-02 DIAGNOSIS — M7918 Myalgia, other site: Secondary | ICD-10-CM

## 2017-02-02 DIAGNOSIS — Z79899 Other long term (current) drug therapy: Secondary | ICD-10-CM | POA: Diagnosis not present

## 2017-02-02 DIAGNOSIS — M47814 Spondylosis without myelopathy or radiculopathy, thoracic region: Secondary | ICD-10-CM | POA: Insufficient documentation

## 2017-02-02 DIAGNOSIS — F17210 Nicotine dependence, cigarettes, uncomplicated: Secondary | ICD-10-CM | POA: Insufficient documentation

## 2017-02-02 DIAGNOSIS — I10 Essential (primary) hypertension: Secondary | ICD-10-CM | POA: Diagnosis not present

## 2017-02-02 DIAGNOSIS — M4696 Unspecified inflammatory spondylopathy, lumbar region: Secondary | ICD-10-CM | POA: Insufficient documentation

## 2017-02-02 DIAGNOSIS — M47816 Spondylosis without myelopathy or radiculopathy, lumbar region: Secondary | ICD-10-CM

## 2017-02-02 DIAGNOSIS — Z8249 Family history of ischemic heart disease and other diseases of the circulatory system: Secondary | ICD-10-CM | POA: Insufficient documentation

## 2017-02-02 DIAGNOSIS — Z885 Allergy status to narcotic agent status: Secondary | ICD-10-CM | POA: Insufficient documentation

## 2017-02-02 DIAGNOSIS — Z809 Family history of malignant neoplasm, unspecified: Secondary | ICD-10-CM | POA: Insufficient documentation

## 2017-02-02 LAB — COMPREHENSIVE METABOLIC PANEL
ALK PHOS: 75 U/L (ref 38–126)
ALT: 12 U/L — AB (ref 14–54)
AST: 15 U/L (ref 15–41)
Albumin: 3.2 g/dL — ABNORMAL LOW (ref 3.5–5.0)
Anion gap: 7 (ref 5–15)
BUN: 9 mg/dL (ref 6–20)
CALCIUM: 9 mg/dL (ref 8.9–10.3)
CHLORIDE: 104 mmol/L (ref 101–111)
CO2: 27 mmol/L (ref 22–32)
CREATININE: 0.69 mg/dL (ref 0.44–1.00)
GFR calc non Af Amer: >60 mL/min (ref >60)
Glucose, Bld: 129 mg/dL — ABNORMAL HIGH (ref 65–99)
Potassium: 3.9 mmol/L (ref 3.5–5.1)
SODIUM: 138 mmol/L (ref 135–145)
Total Bilirubin: 0.3 mg/dL (ref 0.3–1.2)
Total Protein: 7 g/dL (ref 6.5–8.1)

## 2017-02-02 MED ORDER — ORPHENADRINE CITRATE 30 MG/ML IJ SOLN
60.0000 mg | Freq: Once | INTRAMUSCULAR | Status: AC
  Administered 2017-02-02: 60 mg via INTRAMUSCULAR
  Filled 2017-02-02: qty 2

## 2017-02-02 NOTE — Progress Notes (Signed)
Patient's Name: Deanna Jacobson  MRN: [id number]  Referring Provider: [facility], [facility]  DOB: 04/23/1956  PCP: [facility] [place]  DOS: 02/02/2017  Note by: Vevelyn Francois NP  Service setting: Ambulatory outpatient  Specialty: Interventional Pain Management  Location: [facility] (AMB) Pain Management Facility    Patient type: Established    Primary Reason(s) for Visit: Evaluation of chronic illnesses with exacerbation, or progression (Level of risk: moderate) CC: Back Pain (right lower)  HPI  Deanna Jacobson is a 62 y.o. year old, female patient, who comes today for a follow-up evaluation. She has Acute renal failure (ARF) ([place]); Long term current use of opiate analgesic; Long term prescription opiate use; Opiate use; Encounter for therapeutic drug level monitoring; Encounter for pain management planning; Chronic mid back pain; Chronic thoracic spine pain (midline); Thoracic facet syndrome (Bilateral); Lumbar facet arthropathy ([place]); Musculoskeletal pain; Neurogenic pain; Osteoarthritis; Chronic pain syndrome; Thoracic spondylosis; Thoracolumbar spondylosis; and Marijuana use on her problem list. Deanna Jacobson was last seen on 12/22/2016. Her primarily concern today is the Back Pain (right lower)  Pain Assessment: Location: Right, Lower Back Radiating: na Onset: More than a month ago Duration: Chronic pain Quality: Aching Severity: 3 /10 (self-reported pain score)  Note: Reported level is compatible with observation.                   Effect on ADL: patient c/o nerves being bad for some reason and hasn't been able to eat and very tearful at work.  states that she thinks part of her pain is coming from her anxiety and being so tense.   Timing: Intermittent Modifying factors: goody powders, ice packs.  hasn't been able to take pain medication because she hasn't been able to eat.   Further details on both, my assessment(s), as well as the proposed treatment plan, please see  below. She is complaining of increased pain and anxiety. She states that she was having increased nausea and vomiting on last week. She states that this has improved and she is eating small portions. She admits that she was on Valium in the past. She admits that was effective. She has started back to work. She admits that she there has been some changes at work. She is feeling anxious all over.   Laboratory Chemistry  Inflammation Markers (CRP: Acute Phase) (ESR: Chronic Phase) Lab Results  Component Value Date   CRP 0.4 12/23/2016   ESRSEDRATE 7 12/23/2016                 Renal Function Markers Lab Results  Component Value Date   BUN 13 02/01/2016   CREATININE 1.12 (H) 02/01/2016   GFRAA >60 02/01/2016   GFRNONAA 52 (L) 02/01/2016                 Hepatic Function Markers Lab Results  Component Value Date   AST 16 10/28/2015   ALT 11 (L) 10/28/2015   ALBUMIN 3.7 10/28/2015   ALKPHOS 51 10/28/2015                 Electrolytes Lab Results  Component Value Date   NA 137 02/01/2016   K 3.6 02/01/2016   CL 107 02/01/2016   CALCIUM 8.1 (L) 02/01/2016   MG 1.9 12/23/2016                 Neuropathy Markers Lab Results  Component Value Date   VITAMINB12 734 12/23/2016  Bone Pathology Markers Lab Results  Component Value Date   ALKPHOS 51 10/28/2015   25OHVITD1 49 12/23/2016   25OHVITD2 3.2 12/23/2016   25OHVITD3 46 12/23/2016   CALCIUM 8.1 (L) 02/01/2016                 Coagulation Parameters Lab Results  Component Value Date   PLT 325 12/23/2016                 Cardiovascular Markers Lab Results  Component Value Date   HGB 12.8 12/23/2016   HCT 39.9 12/23/2016                 Note: Lab results reviewed.  Recent Diagnostic Imaging Review  Dg C-arm 1-60 Min-no Report  Result Date: 09/27/2016 Fluoroscopy was utilized by the requesting physician.  No radiographic interpretation.   Thoracic Imaging:  Thoracic DG 2-3 views:  Results for  orders placed in visit on 06/06/08  DG Thoracic Spine 2 View   Narrative * PRIOR REPORT IMPORTED FROM AN EXTERNAL SYSTEM    PRIOR REPORT IMPORTED FROM THE [facility] [place] FOR EXAM:    BACK PAIN  COMMENTS:   PROCEDURE:     DXR - DXR THORACIC  AP AND LATERAL  - Jun 06 2008 11:20AM   RESULT:     Images of the thoracic spine demonstrate the vertebral body  heights and intervertebral disc spaces appear to be normal. Spinal  alignment  is normal. Some mild degenerative end-plate spurring is present. The  included ribs show no gross bony abnormality.   IMPRESSION:      Mild degenerative changes of the thoracic spine. MRI  follow-up is available if desired. No definite fracture is seen. Alignment  is maintained.   Thank you for this opportunity to contribute to the care of your patient.       Thoracic DG:  Results for orders placed in visit on 11/25/15  DG THORACOLUMABAR SPINE    Lumbosacral Imaging: Lumbar MR wo contrast:  Results for orders placed in visit on 11/25/15  MR Lumbar Spine Wo Contrast  MR Lumbar Spine Wo Contrast     Note: Results of ordered imaging test(s) reviewed and explained to patient in Layman's terms. Copy of results provided to patient  Meds   Current Outpatient Prescriptions:  .  cyclobenzaprine (FLEXERIL) 10 MG tablet, Take 1 tablet (10 mg total) by mouth every 8 (eight) hours as needed for muscle spasms., Disp: 90 tablet, Rfl: 2 .  gabapentin (NEURONTIN) 100 MG capsule, Take 1-3 capsules (100-300 mg total) by mouth at bedtime., Disp: 90 capsule, Rfl: 2 .  lisinopril (PRINIVIL,ZESTRIL) 20 MG tablet, Take 1 tablet (20 mg total) by mouth daily., Disp: 30 tablet, Rfl: 2 .  meloxicam (MOBIC) 15 MG tablet, Take 1 tablet (15 mg total) by mouth daily., Disp: 30 tablet, Rfl: 2 .  traMADol (ULTRAM) 50 MG tablet, Take 1-2 tablets (50-100 mg total) by mouth every 6 (six) hours as needed for severe pain., Disp: 240 tablet, Rfl: 2 .  traZODone  (DESYREL) 100 MG tablet, Take 100 mg by mouth at bedtime. , Disp: , Rfl: 4  Current Facility-Administered Medications:  .  orphenadrine (NORFLEX) injection 60 mg, 60 mg, Intramuscular, Once, Ollin Hochmuth, [employer], NP  ROS  Constitutional: Denies any fever or chills Gastrointestinal: No reported hemesis, hematochezia, vomiting, or acute GI distress Musculoskeletal: Denies any acute onset joint swelling, redness, loss of ROM, or weakness Neurological: No reported episodes  of acute onset apraxia, aphasia, dysarthria, agnosia, amnesia, paralysis, loss of coordination, or loss of consciousness  Allergies  Deanna Jacobson is allergic to codeine.  [place]  Drug: Deanna Jacobson  reports that she uses drugs, including Marijuana. Alcohol:  reports that she drinks alcohol. Tobacco:  reports that she has been smoking Cigarettes.  She has been smoking about 1.00 pack per day. She has never used smokeless tobacco. Medical:  has a past medical history of Chronic back pain and Hypertension. Surgical: Deanna Jacobson  has a past surgical history that includes Breast enhancement surgery. Family: family history includes Cancer in her mother; Hypertension in her father.  Constitutional Exam  General appearance: Well nourished, well developed, and well hydrated. In no apparent acute distress Vitals:   02/02/17 0916  BP: (!) 209/90  Pulse: 84  Resp: 16  Temp: 98.4 F (36.9 C)  TempSrc: Oral  SpO2: 100%  Weight: 101 lb 12.8 oz (46.2 kg)  Height: '5\' 1"'  (1.549 m)   BMI Assessment: Estimated body mass index is 19.23 kg/m as calculated from the following:   Height as of this encounter: '5\' 1"'  (1.549 m).   Weight as of this encounter: 101 lb 12.8 oz (46.2 kg). Psych/Mental status: Alert, oriented x 3 (person, place, & time)       Eyes: PERLA Respiratory: No evidence of acute respiratory distress  Cervical Spine Area Exam  Skin & Axial Inspection: No masses, redness, edema, swelling, or associated skin  lesions Alignment: Symmetrical Functional ROM: Unrestricted ROM      Stability: No instability detected Muscle Tone/Strength: Functionally intact. No obvious neuro-muscular anomalies detected. Sensory (Neurological): Unimpaired Palpation: No palpable anomalies              Upper Extremity (UE) Exam    Side: Right upper extremity  Side: Left upper extremity  Skin & Extremity Inspection: Skin color, temperature, and hair growth are WNL. No peripheral edema or cyanosis. No masses, redness, swelling, asymmetry, or associated skin lesions. No contractures.  Skin & Extremity Inspection: Skin color, temperature, and hair growth are WNL. No peripheral edema or cyanosis. No masses, redness, swelling, asymmetry, or associated skin lesions. No contractures.  Functional ROM: Unrestricted ROM          Functional ROM: Unrestricted ROM          Muscle Tone/Strength: Functionally intact. No obvious neuro-muscular anomalies detected.  Muscle Tone/Strength: Functionally intact. No obvious neuro-muscular anomalies detected.  Sensory (Neurological): Unimpaired          Sensory (Neurological): Unimpaired          Palpation: No palpable anomalies              Palpation: No palpable anomalies              Specialized Test(s): Deferred         Specialized Test(s): Deferred          Thoracic Spine Area Exam  Skin & Axial Inspection: No masses, redness, or swelling Alignment: Symmetrical Functional ROM: Unrestricted ROM Stability: No instability detected Muscle Tone/Strength: Functionally intact. No obvious neuro-muscular anomalies detected. Sensory (Neurological): Unimpaired Muscle strength & Tone: No palpable anomalies  Lumbar Spine Area Exam  Skin & Axial Inspection: No masses, redness, or swelling Alignment: Symmetrical Functional ROM: Unrestricted ROM      Stability: No instability detected Muscle Tone/Strength: Functionally intact. No obvious neuro-muscular anomalies detected. Sensory (Neurological):  Unimpaired Palpation: Complains of area being tender to palpation  Provocative Tests: Lumbar Hyperextension and rotation test: Positive on the right for facet joint pain. Lumbar Lateral bending test: evaluation deferred today       Patrick's Maneuver: evaluation deferred today                    Gait & Posture Assessment  Ambulation: Unassisted Gait: Relatively normal for age and body habitus Posture: WNL   Lower Extremity Exam    Side: Right lower extremity  Side: Left lower extremity  Skin & Extremity Inspection: Skin color, temperature, and hair growth are WNL. No peripheral edema or cyanosis. No masses, redness, swelling, asymmetry, or associated skin lesions. No contractures.  Skin & Extremity Inspection: Skin color, temperature, and hair growth are WNL. No peripheral edema or cyanosis. No masses, redness, swelling, asymmetry, or associated skin lesions. No contractures.  Functional ROM: Unrestricted ROM          Functional ROM: Unrestricted ROM          Muscle Tone/Strength: Functionally intact. No obvious neuro-muscular anomalies detected.  Muscle Tone/Strength: Functionally intact. No obvious neuro-muscular anomalies detected.  Sensory (Neurological): Unimpaired  Sensory (Neurological): Unimpaired  Palpation: No palpable anomalies  Palpation: No palpable anomalies   Assessment  Primary Diagnosis & Pertinent Problem List: The primary encounter diagnosis was Chronic thoracic spine pain (midline). Diagnoses of Lumbar facet arthropathy ([place]), Musculoskeletal pain, Opiate use, Chronic pain syndrome, and Long term current use of opiate analgesic were also pertinent to this visit.  Status Diagnosis  Controlled Controlled Controlled 1. Chronic thoracic spine pain (midline)   2. Lumbar facet arthropathy ([facility])   3. Musculoskeletal pain   4. Opiate use   5. Chronic pain syndrome   6. Long term current use of opiate analgesic     Problems updated and reviewed during this visit: No  problems updated. Plan of Care  Pharmacotherapy (Medications Ordered): Meds ordered this encounter  Medications  . orphenadrine (NORFLEX) injection 60 mg   New Prescriptions   No medications on file   Medications administered today: Deanna Jacobson had no medications administered during this visit. Lab-work, procedure(s), and/or referral(s): Orders Placed This Encounter  Procedures  . Radiofrequency,Lumbar  . Comprehensive metabolic panel  . ToxASSURE Select 13 (MW), Urine   Imaging and/or referral(s): None  Interventional therapies: Planned, scheduled, and/or pending:  RightT12, L1, L2, &L3Medial Branch Level(s)RFA Instructed pt to call PCP and set up an apt to discuss her depression and anxiety Instructed patient that she can not use illegal substances and narcotics    Considering:  Palliative LeftT12, L1, L2, &L3Medial Branch Level(s)RFA (Last done on 06/30/16)   Palliative PRN treatment(s):  PalliativeT12, L1, L2, &L3Medial Branch Level(s)Block PalliativeT12, L1, L2, &L3Medial Branch Level(s)radiofrequency abla   Provider-requested follow-up: Return for Procedure(w/Sedation), w/ Dr. Dossie Arbour, ([facility]).  Future Appointments Date Time Provider [place]  03/24/2017 9:00 AM Vevelyn Francois, NP [facility] None   Primary Care Physician: [facility], [place] Location: [facility] Out[facility] Facility Note by: Vevelyn Francois NP Date: 02/02/2017; Time: 11:27 AM  Pain Score Disclaimer: We use the NRS-11 scale. This is a self-reported, subjective measurement of pain severity with only modest accuracy. It is used primarily to identify changes within a particular patient. It must be understood that outpatient pain scales are significantly less accurate that those used for research, where they can be applied under ideal controlled circumstances with minimal exposure to variables. In reality, the score is likely to be a combination  of pain  intensity and pain affect, where pain affect describes the degree of emotional arousal or changes in action readiness caused by the sensory experience of pain. Factors such as social and work situation, setting, emotional state, anxiety levels, expectation, and prior pain experience may influence pain perception and show large inter-individual differences that may also be affected by time variables.  Patient instructions provided during this appointment: Patient Instructions   ____________________________________________________________________________________________  Medication Rules  Applies to: All patients receiving prescriptions (written or electronic).  Pharmacy of record: Pharmacy where electronic prescriptions will be sent. If written prescriptions are taken to a different pharmacy, please inform the nursing staff. The pharmacy listed in the electronic medical record should be the one where you would like electronic prescriptions to be sent.  Prescription refills: Only during scheduled appointments. Applies to both, written and electronic prescriptions.  NOTE: The following applies primarily to controlled substances (Opioid* Pain Medications).   Patient's responsibilities: 1. Pain Pills: Bring all pain pills to every appointment (except for procedure appointments). 2. Pill Bottles: Bring pills in original pharmacy bottle. Always bring newest bottle. Bring bottle, even if empty. 3. Medication refills: You are responsible for knowing and keeping track of what medications you need refilled. The day before your appointment, write a list of all prescriptions that need to be refilled. Bring that list to your appointment and give it to the admitting nurse. Prescriptions will be written only during appointments. If you forget a medication, it will not be "Called in", "Faxed", or "electronically sent". You will need to get another appointment to get these prescribed. 4. Prescription  Accuracy: You are responsible for carefully inspecting your prescriptions before leaving our office. Have the discharge nurse carefully go over each prescription with you, before taking them home. Make sure that your name is accurately spelled, that your address is correct. Check the name and dose of your medication to make sure it is accurate. Check the number of pills, and the written instructions to make sure they are clear and accurate. Make sure that you are given enough medication to last until your next medication refill appointment. 5. Taking Medication: Take medication as prescribed. Never take more pills than instructed. Never take medication more frequently than prescribed. Taking less pills or less frequently is permitted and encouraged, when it comes to controlled substances (written prescriptions).  6. Inform other Doctors: Always inform, all of your healthcare providers, of all the medications you take. 7. Pain Medication from other Providers: You are not allowed to accept any additional pain medication from any other Doctor or Healthcare provider. There are two exceptions to this rule. (see below) In the event that you require additional pain medication, you are responsible for notifying us, as stated below. 8. Medication Agreement: You are responsible for carefully reading and following our Medication Agreement. This must be signed before receiving any prescriptions from our practice. Safely store a copy of your signed Agreement. Violations to the Agreement will result in no further prescriptions. (Additional copies of our Medication Agreement are available upon request.) 9. Laws, Rules, & Regulations: All patients are expected to follow all Federal and [employer], Rules, [employer]. Ignorance of the Laws does not constitute a valid excuse. The use of any illegal substances is prohibited. 10. Adopted CDC guidelines & recommendations: Target dosing levels will be at or below 60  MME/day. Use of benzodiazepines** is not recommended.  Exceptions: There are only two exceptions to the rule of not receiving pain medications from other Healthcare  Providers. 1. Exception #1 (Emergencies): In the event of an emergency (i.e.: accident requiring emergency care), you are allowed to receive additional pain medication. However, you are responsible for: As soon as you are able, call our office [phone], at any time of the day or night, and leave a message stating your name, the date and nature of the emergency, and the name and dose of the medication prescribed. In the event that your call is answered by a member of our staff, make sure to document and save the date, time, and the name of the person that took your information.  2. Exception #2 (Planned Surgery): In the event that you are scheduled by another doctor or dentist to have any type of surgery or procedure, you are allowed (for a period no longer than 30 days), to receive additional pain medication, for the acute post-op pain. However, in this case, you are responsible for picking up a copy of our "Post-op Pain Management for Surgeons" handout, and giving it to your surgeon or dentist. This document is available at our office, and does not require an appointment to obtain it. Simply go to our office during business hours (Monday-Thursday from 8:00 AM to 4:00 PM) (Friday 8:00 AM to 12:00 Noon) or if you have a scheduled appointment with [place], prior to your surgery, and ask for it by name. In addition, you will need to provide [place] with your name, name of your surgeon, type of surgery, and date of procedure or surgery.  *Opioid medications include: morphine, codeine, oxycodone, oxymorphone, hydrocodone, hydromorphone, meperidine, tramadol, tapentadol, buprenorphine, fentanyl, methadone. **Benzodiazepine medications include: diazepam (Valium), alprazolam (Xanax), clonazepam (Klonopine), lorazepam (Ativan), clorazepate (Tranxene),  chlordiazepoxide (Librium), estazolam (Prosom), oxazepam (Serax), temazepam (Restoril), triazolam (Halcion)  ____________________________________________________________________________________________

## 2017-02-02 NOTE — Patient Instructions (Signed)

## 2017-02-02 NOTE — Telephone Encounter (Signed)
Called patient to let her know that a procedure has been scheduled for her and that we need to give her instructions and get her insurance information in order to send for PA,  Voicemail left with this information and to please call our office.

## 2017-02-02 NOTE — Progress Notes (Signed)
Safety precautions to be maintained throughout the outpatient stay will include: orient to surroundings, keep bed in low position, maintain call bell within reach at all times, provide assistance with transfer out of bed and ambulation.  

## 2017-02-08 LAB — TOXASSURE SELECT 13 (MW), URINE

## 2017-03-15 ENCOUNTER — Ambulatory Visit: Payer: BLUE CROSS/BLUE SHIELD | Admitting: Pain Medicine

## 2017-03-24 ENCOUNTER — Encounter: Payer: Self-pay | Admitting: Nurse Practitioner

## 2017-03-24 ENCOUNTER — Ambulatory Visit: Payer: BLUE CROSS/BLUE SHIELD | Attending: Nurse Practitioner | Admitting: Nurse Practitioner

## 2017-03-24 VITALS — BP 155/75 | HR 92 | Temp 98.7°F | Resp 16 | Ht 61.0 in | Wt 104.0 lb

## 2017-03-24 DIAGNOSIS — M792 Neuralgia and neuritis, unspecified: Secondary | ICD-10-CM | POA: Diagnosis not present

## 2017-03-24 DIAGNOSIS — F17210 Nicotine dependence, cigarettes, uncomplicated: Secondary | ICD-10-CM | POA: Insufficient documentation

## 2017-03-24 DIAGNOSIS — M546 Pain in thoracic spine: Secondary | ICD-10-CM

## 2017-03-24 DIAGNOSIS — M1991 Primary osteoarthritis, unspecified site: Secondary | ICD-10-CM | POA: Diagnosis not present

## 2017-03-24 DIAGNOSIS — N179 Acute kidney failure, unspecified: Secondary | ICD-10-CM | POA: Diagnosis not present

## 2017-03-24 DIAGNOSIS — M7918 Myalgia, other site: Secondary | ICD-10-CM

## 2017-03-24 DIAGNOSIS — Z5181 Encounter for therapeutic drug level monitoring: Secondary | ICD-10-CM | POA: Diagnosis not present

## 2017-03-24 DIAGNOSIS — Z79899 Other long term (current) drug therapy: Secondary | ICD-10-CM | POA: Diagnosis not present

## 2017-03-24 DIAGNOSIS — F1290 Cannabis use, unspecified, uncomplicated: Secondary | ICD-10-CM

## 2017-03-24 DIAGNOSIS — Z791 Long term (current) use of non-steroidal anti-inflammatories (NSAID): Secondary | ICD-10-CM | POA: Insufficient documentation

## 2017-03-24 DIAGNOSIS — Z79891 Long term (current) use of opiate analgesic: Secondary | ICD-10-CM | POA: Insufficient documentation

## 2017-03-24 DIAGNOSIS — Z809 Family history of malignant neoplasm, unspecified: Secondary | ICD-10-CM | POA: Insufficient documentation

## 2017-03-24 DIAGNOSIS — G894 Chronic pain syndrome: Secondary | ICD-10-CM

## 2017-03-24 DIAGNOSIS — G8929 Other chronic pain: Secondary | ICD-10-CM

## 2017-03-24 DIAGNOSIS — M150 Primary generalized (osteo)arthritis: Secondary | ICD-10-CM

## 2017-03-24 DIAGNOSIS — F329 Major depressive disorder, single episode, unspecified: Secondary | ICD-10-CM | POA: Diagnosis not present

## 2017-03-24 DIAGNOSIS — Z885 Allergy status to narcotic agent status: Secondary | ICD-10-CM | POA: Insufficient documentation

## 2017-03-24 DIAGNOSIS — M159 Polyosteoarthritis, unspecified: Secondary | ICD-10-CM

## 2017-03-24 DIAGNOSIS — I10 Essential (primary) hypertension: Secondary | ICD-10-CM | POA: Diagnosis not present

## 2017-03-24 MED ORDER — MELOXICAM 15 MG PO TABS: 15.0000 mg | ORAL_TABLET | Freq: Every day | ORAL | 2 refills | Status: DC

## 2017-03-24 MED ORDER — CYCLOBENZAPRINE HCL 10 MG PO TABS: 10.0000 mg | ORAL_TABLET | Freq: Three times a day (TID) | ORAL | 2 refills | Status: DC | PRN

## 2017-03-24 MED ORDER — GABAPENTIN 100 MG PO CAPS: 100.0000 mg | ORAL_CAPSULE | Freq: Every day | ORAL | 2 refills | Status: DC

## 2017-03-24 NOTE — Progress Notes (Signed)
Nursing Pain Medication Assessment:  Safety precautions to be maintained throughout the outpatient stay will include: orient to surroundings, keep bed in low position, maintain call bell within reach at all times, provide assistance with transfer out of bed and ambulation.  Medication Inspection Compliance: Pill count conducted under aseptic conditions, in front of the patient. Neither the pills nor the bottle was removed from the patient's sight at any time. Once count was completed pills were immediately returned to the patient in their original bottle.  Medication: Tramadol (Ultram) Pill/Patch Count: 12 of 240 pills remain Pill/Patch Appearance: Markings consistent with prescribed medication Bottle Appearance: Standard pharmacy container. Clearly labeled. Filled Date:07/30/ 2018 Last Medication intake:  Yesterday   Now taking hemp oil twice per day, states it helps pain

## 2017-03-24 NOTE — Patient Instructions (Addendum)
____________________________________________________________________________________________  Medication Rules  Applies to: All patients receiving prescriptions (written or electronic).  Pharmacy of record: Pharmacy where electronic prescriptions will be sent. If written prescriptions are taken to a different pharmacy, please inform the nursing staff. The pharmacy listed in the electronic medical record should be the one where you would like electronic prescriptions to be sent.  Prescription refills: Only during scheduled appointments. Applies to both, written and electronic prescriptions.  NOTE: The following applies primarily to controlled substances (Opioid* Pain Medications).   Patient's responsibilities: 1. Pain Pills: Bring all pain pills to every appointment (except for procedure appointments). 2. Pill Bottles: Bring pills in original pharmacy bottle. Always bring newest bottle. Bring bottle, even if empty. 3. Medication refills: You are responsible for knowing and keeping track of what medications you need refilled. The day before your appointment, write a list of all prescriptions that need to be refilled. Bring that list to your appointment and give it to the admitting nurse. Prescriptions will be written only during appointments. If you forget a medication, it will not be "Called in", "Faxed", or "electronically sent". You will need to get another appointment to get these prescribed. 4. Prescription Accuracy: You are responsible for carefully inspecting your prescriptions before leaving our office. Have the discharge nurse carefully go over each prescription with you, before taking them home. Make sure that your name is accurately spelled, that your address is correct. Check the name and dose of your medication to make sure it is accurate. Check the number of pills, and the written instructions to make sure they are clear and accurate. Make sure that you are given enough medication to  last until your next medication refill appointment. 5. Taking Medication: Take medication as prescribed. Never take more pills than instructed. Never take medication more frequently than prescribed. Taking less pills or less frequently is permitted and encouraged, when it comes to controlled substances (written prescriptions).  6. Inform other Doctors: Always inform, all of your healthcare providers, of all the medications you take. 7. Pain Medication from other Providers: You are not allowed to accept any additional pain medication from any other Doctor or Healthcare provider. There are two exceptions to this rule. (see below) In the event that you require additional pain medication, you are responsible for notifying us, as stated below. 8. Medication Agreement: You are responsible for carefully reading and following our Medication Agreement. This must be signed before receiving any prescriptions from our practice. Safely store a copy of your signed Agreement. Violations to the Agreement will result in no further prescriptions. (Additional copies of our Medication Agreement are available upon request.) 9. Laws, Rules, & Regulations: All patients are expected to follow all Federal and State Laws, Statutes, Rules, & Regulations. Ignorance of the Laws does not constitute a valid excuse. The use of any illegal substances is prohibited. 10. Adopted CDC guidelines & recommendations: Target dosing levels will be at or below 60 MME/day. Use of benzodiazepines** is not recommended.  Exceptions: There are only two exceptions to the rule of not receiving pain medications from other Healthcare Providers. 1. Exception #1 (Emergencies): In the event of an emergency (i.e.: accident requiring emergency care), you are allowed to receive additional pain medication. However, you are responsible for: As soon as you are able, call our office [phone], at any time of the day or night, and leave a message stating your  name, the date and nature of the emergency, and the name and dose of the medication   prescribed. In the event that your call is answered by a member of our staff, make sure to document and save the date, time, and the name of the person that took your information.  2. Exception #2 (Planned Surgery): In the event that you are scheduled by another doctor or dentist to have any type of surgery or procedure, you are allowed (for a period no longer than 30 days), to receive additional pain medication, for the acute post-op pain. However, in this case, you are responsible for picking up a copy of our "Post-op Pain Management for Surgeons" handout, and giving it to your surgeon or dentist. This document is available at our office, and does not require an appointment to obtain it. Simply go to our office during business hours (Monday-Thursday from 8:00 AM to 4:00 PM) (Friday 8:00 AM to 12:00 Noon) or if you have a scheduled appointment with [place], prior to your surgery, and ask for it by name. In addition, you will need to provide [place] with your name, name of your surgeon, type of surgery, and date of procedure or surgery.  *Opioid medications include: morphine, codeine, oxycodone, oxymorphone, hydrocodone, hydromorphone, meperidine, tramadol, tapentadol, buprenorphine, fentanyl, methadone. **Benzodiazepine medications include: diazepam (Valium), alprazolam (Xanax), clonazepam (Klonopine), lorazepam (Ativan), clorazepate (Tranxene), chlordiazepoxide (Librium), estazolam (Prosom), oxazepam (Serax), temazepam (Restoril), triazolam (Halcion)  ____________________________________________________________________________________________ Patient's Name: Deanna Jacobson  03/24/2017  [place] [id number]    DOB: February 17, 1956    MRN: [id number]        Re: Urine Drug Screening Test Results.     Dear Deanna Jacobson:  The Test: We do not use a POC (Point-of-care) immunoassay testing urine drug screen (UDS)  test, due to the fact that for illicit drugs, the false-positive rate is 0% for cocaine, 2% for marijuana, 0.9% for amphetamines, and 1.2% for methamphetamines. Instead, our facility uses a forensic state-of-the-art ultra-high performance liquid chromatography and mass spectrometry system (UPLC/MS-MS), the most sophisticated and accurate method currently available. It is 1,000 times more precise and accurate than standard gas chromatography and mass spectrometry (GC/MS) testing. This is the test used to detect false positive and false negative results in immunoassay testing. The system can analyze 26 drug categories and 180 drug compounds. It will not only detect the presence of a parent compound its break down products (metabolites), but it will also provide the exact amounts, down to nanograms (1/1,000,000 of a milligram).  Findings: Findings (results) of this test are reported as abnormal or unexpected when there is a discrepancy or inconsistency between the expected results and those detected during testing. Expectations are based on the medication history provided by the patient at the time of sample collection.  Abnormal Results:  These generally fall under one of two primary categories: Category 1: Substances reported to be taken, but found to be absent: If you are being prescribed a pain medication, it should be detected in your urine sample. Not detecting the medication indicates that the medication is not in the system and has not been taken for a couple of days. Abnormal results under this category may suggest one of the following two possible medication agreement violation(s): 1). Opioid diversion:  (Selling, lending, or giving medications to an individual for whom the medication was not intended or prescribed.) 2). Non-compliance with prescription instructions on how to take the medication. (Taking more medication than allowed.) Category 2: An unreported substance is found to be  present: These results may suggest the following medication agreement violation(s): 1).  The illegal use of a substance. This includes the use of illegal substances (such as cocaine, marijuana, PCP, etc.) or the illegal use of a legal prescription drug (such as using a medication that was not prescribed to you or for you; or purchasing a prescription drug from a street dealer or a friend). 2). Obtaining medications from multiple sources. (i.e.: "Doctor shopping")  Medication Policy: When it comes to illicit or illegal use, I have a "Zero Tolerance" policy.  Your Results: THC  Determination: The findings of this UDT are unacceptable and incompatible with appropriate, safe, and responsible use of controlled substances. These results are  incompatible with our medication policy, as well as our opioid treatment agreement. This represents non-compliance with the safe use of controlled substances.  To ensure your safety, I am modifying you treatment plan as follows:  New Treatment Plan: Effective immediately, you are no longer a candidate for opioid, or opioid-related medication management. This means that effective today, I will not be prescribing any opioid or opioid-related pain medication as part of your treatment. Your new treatment plan will exclude the use of any and all controlled substances. This decision  is based on my belief that the risks outweigh the benefits.  Appeals: This decision is final, irreversible, and not open for debate. This represents a change in your treatment plan and not a discharge from our practice. Should you decide that what we have to offer is not in accordance to your wishes, you have the right to transfer your care to another practice or facility. From this point on, we will remain available to you for injection therapies only.  If you are already taking pain medications, I recommend that you immediately begin tapering them down, so as to stop them. I recommend  decreasing the number of pills you take on a daily basis, by one pill every seven (7) days. If you do not believe that you can do this on your own, please contact one of the following drug rehabilitation centers below:  [facility] [address]  [place], [place] [address] Tel.: [phone]  [facility] [address] [place], [place] [address] Tel.: [phone]      [facility]) [place], [place], [place] [address] Tel.: [phone]  [facility] [address] [place], [place] [address] Tel.: [phone]      [facility] [address], [place], [place] [address] Tel.: [phone]  [place], [place] [address] Tel.: [phone]    Alternatively, if you would like to get our assistance in tapering down and stopping your pain medications, please contact Cherly Anderson, RN, our practice coordinator, to make arrangements for this. Tel.: [phone].  Most sincerely,  ________________________ Dionisio David NP

## 2017-03-24 NOTE — Progress Notes (Signed)
Patient's Name: Deanna Jacobson  MRN: [id number]  Referring Provider: [facility]  DOB: 27-Dec-1955  PCP: [facility], [place]: 03/24/2017  Note by: Vevelyn Francois NP  Service setting: Ambulatory outpatient  Specialty: Interventional Pain Management  Location: [facility] (AMB) Pain Management Facility    Patient type: Established    Primary Reason(s) for Visit: Encounter for prescription drug management. (Level of risk: moderate)  CC: Back Pain (lower, worse on right side)  HPI  Deanna Jacobson is a 61 y.o. year old, female patient, who comes today for a medication management evaluation. She has Acute renal failure (ARF) ([place]); Long term current use of opiate analgesic; Long term prescription opiate use; Opiate use; Encounter for therapeutic drug level monitoring; Encounter for pain management planning; Chronic mid back pain; Chronic thoracic spine pain (midline); Thoracic facet syndrome (B[place]); Lumbar facet arthropathy ([place]); Musculoskeletal pain; Neurogenic pain; Osteoarthritis; Chronic pain syndrome; Thoracic spondylosis; Thoracolumbar spondylosis; and Marijuana use on her problem list. Her primarily concern today is the Back Pain (lower, worse on right side)  Pain Assessment: Location: Lower Back Radiating: n/a Onset: More than a month ago Duration: Chronic pain Quality: Aching, Burning (stinging) Severity: 0-No pain/10 (self-reported pain score)  Note: Reported level is compatible with observation.                   When using our objective Pain Scale, levels between 6 and 10/10 are said to belong in an emergency room, as it progressively worsens from a 6/10, described as severely limiting, requiring emergency care not usually available at an outpatient pain management facility. At a 6/10 level, communication becomes difficult and requires great effort. Assistance to reach the emergency department may be required. Facial flushing and profuse sweating along  with potentially dangerous increases in heart rate and blood pressure will be evident. Effect on ADL:   Timing: Intermittent Modifying factors: Hemp oil  Deanna Jacobson was last scheduled for an appointment on 02/02/2017 for medication management. During today's appointment we reviewed Ms. Detweiler's chronic pain status, as well as her outpatient medication regimen. She denies any radicluar pain in her legs. She is going to have a right RFA. She admits that she is on Hemp oil that is helping. She admits that this is helping with pain, sleep and mood. She states that she has had some changes with her BP medication and she has been placed on medication for her depression.   The patient  reports that she uses drugs, including Marijuana. Her body mass index is 19.65 kg/m.  Further details on both, my assessment(s), as well as the proposed treatment plan, please see below.  Controlled Substance Pharmacotherapy Assessment REMS (Risk Evaluation and Mitigation Strategy)  Analgesic:Tramadol 50 mg 1 every 6 hours (200 mg/day of tramadol) MME/day:20 mg/day Landis Martins, RN  03/24/2017  9:43 AM  Sign at close encounter Nursing Pain Medication Assessment:  Safety precautions to be maintained throughout the outpatient stay will include: orient to surroundings, keep bed in low position, maintain call bell within reach at all times, provide assistance with transfer out of bed and ambulation.  Medication Inspection Compliance: Pill count conducted under aseptic conditions, in front of the patient. Neither the pills nor the bottle was removed from the patient's sight at any time. Once count was completed pills were immediately returned to the patient in their original bottle.  Medication: Tramadol (Ultram) Pill/Patch Count: 12 of 240 pills remain Pill/Patch Appearance: Markings consistent with prescribed medication  Bottle Appearance: Standard pharmacy container. Clearly labeled. Filled Date:07/30/  2018 Last Medication intake:  Yesterday   Now taking hemp oil twice per day, states it helps pain   Pharmacokinetics: Liberation and absorption (onset of action): WNL Distribution (time to peak effect): WNL Metabolism and excretion (duration of action): WNL         Pharmacodynamics: Desired effects: Analgesia: Deanna Jacobson reports >50% benefit. Functional ability: Patient reports that medication allows her to accomplish basic ADLs Clinically meaningful improvement in function (CMIF): Sustained CMIF goals met Perceived effectiveness: Described as relatively effective, allowing for increase in activities of daily living (ADL) Undesirable effects: Side-effects or Adverse reactions: None reported Monitoring: [place] PMP: Online review of the past 11-monthperiod conducted. Compliant with practice rules and regulations Last UDS on record: Summary  Date Value Ref Range Status  02/02/2017 FINAL  Final    Comment:    ==================================================================== TOXASSURE SELECT 13 (MW) ==================================================================== Test                             Result       Flag       Units Drug Present and Declared for Prescription Verification   Tramadol                       >4000        EXPECTED   ng/mg creat   O-Desmethyltramadol            >4000        EXPECTED   ng/mg creat   N-Desmethyltramadol            421          EXPECTED   ng/mg creat    Source of tramadol is a prescription medication.    O-desmethyltramadol and N-desmethyltramadol are expected    metabolites of tramadol. Drug Present not Declared for Prescription Verification   Carboxy-THC                    33           UNEXPECTED ng/mg creat    Carboxy-THC is a metabolite of tetrahydrocannabinol  (THC).    [facility] of [facility] commonly [facility], but THC is also present    in a scheduled prescription  medication. ==================================================================== Test                      Result    Flag   Units      Ref Range   Creatinine              125              mg/dL      >=20 ==================================================================== Declared Medications:  The flagging and interpretation on this report are based on the  following declared medications.  Unexpected results may arise from  inaccuracies in the declared medications.  **Note: The testing scope of this panel includes these medications:  Tramadol  **Note: The testing scope of this panel does not include following  reported medications:  Cyclobenzaprine  Gabapentin  Lisinopril  Meloxicam  Trazodone ==================================================================== For clinical consultation, please call [phone]====    UDS interpretation: Non-Compliant illicit substance detected Medication Assessment Form: Reviewed. Abnormalities discussed Treatment compliance: Non-compliant. Steps taken to remind the patient of the seriousness of adequate therapy compliance No additional narcotics to be given Risk Assessment Profile:  Aberrant behavior: See prior evaluations. None observed or detected today Comorbid factors increasing risk of overdose: See prior notes. No additional risks detected today Risk of substance use disorder (SUD): Low     Opioid Risk Tool - 02/02/17 0921      Family History of Substance Abuse   Alcohol Negative   Illegal Drugs Negative   Rx Drugs Negative     Personal History of Substance Abuse   Alcohol Positive Female or Female   Illegal Drugs Negative   Rx Drugs Negative     Psychological Disease   Psychological Disease Negative   Depression Negative     Total Score   Opioid Risk Tool Scoring 3   Opioid Risk Interpretation Low Risk     ORT Scoring interpretation table:  Score <3 = Low  Risk for SUD  Score between 4-7 = Moderate Risk for SUD  Score >8 = High Risk for Opioid Abuse   Risk Mitigation Strategies:  Patient Counseling: Covered Patient-Prescriber Agreement (PPA): Present and active  Notification to other healthcare providers: Done  Pharmacologic Plan: No change in therapy, at this time  Laboratory Chemistry  Inflammation Markers (CRP: Acute Phase) (ESR: Chronic Phase) Lab Results  Component Value Date   CRP 0.4 12/23/2016   ESRSEDRATE 7 12/23/2016                 Renal Function Markers Lab Results  Component Value Date   BUN 9 02/02/2017   CREATININE 0.69 02/02/2017   GFRAA >60 02/02/2017   GFRNONAA >60 02/02/2017                 Hepatic Function Markers Lab Results  Component Value Date   AST 15 02/02/2017   ALT 12 (L) 02/02/2017   ALBUMIN 3.2 (L) 02/02/2017   ALKPHOS 75 02/02/2017                 Electrolytes Lab Results  Component Value Date   NA 138 02/02/2017   K 3.9 02/02/2017   CL 104 02/02/2017   CALCIUM 9.0 02/02/2017   MG 1.9 12/23/2016                 Neuropathy Markers Lab Results  Component Value Date   VITAMINB12 734 12/23/2016                 Bone Pathology Markers Lab Results  Component Value Date   ALKPHOS 75 02/02/2017   25OHVITD1 49 12/23/2016   25OHVITD2 3.2 12/23/2016   25OHVITD3 46 12/23/2016   CALCIUM 9.0 02/02/2017                 Coagulation Parameters Lab Results  Component Value Date   PLT 325 12/23/2016                 Cardiovascular Markers Lab Results  Component Value Date   HGB 12.8 12/23/2016   HCT 39.9 12/23/2016                 Note: Lab results reviewed.  Recent Diagnostic Imaging Results  DG C-Arm 1-60 Min-No Report Fluoroscopy was utilized by the requesting physician.  No radiographic  interpretation.   Complexity Note: Imaging results reviewed. Results shared with Deanna Jacobson, using Layman's terms.                         Meds   Current Outpatient  Prescriptions:  .  amLODipine (NORVASC) 2.5 MG tablet,  Take 2.5 mg by mouth., Disp: , Rfl:  .  DULoxetine (CYMBALTA) 30 MG capsule, Take 30 mg by mouth daily., Disp: , Rfl:  .  hydrOXYzine (ATARAX/VISTARIL) 10 MG tablet, Take 10 mg by mouth., Disp: , Rfl:  .  lisinopril (PRINIVIL,ZESTRIL) 20 MG tablet, Take 1 tablet (20 mg total) by mouth daily., Disp: 30 tablet, Rfl: 2 .  traMADol (ULTRAM) 50 MG tablet, Take 1-2 tablets (50-100 mg total) by mouth every 6 (six) hours as needed for severe pain., Disp: 240 tablet, Rfl: 2 .  traZODone (DESYREL) 100 MG tablet, Take 100 mg by mouth at bedtime. , Disp: , Rfl: 4 .  cyclobenzaprine (FLEXERIL) 10 MG tablet, Take 1 tablet (10 mg total) by mouth every 8 (eight) hours as needed for muscle spasms., Disp: 90 tablet, Rfl: 2 .  gabapentin (NEURONTIN) 100 MG capsule, Take 1-3 capsules (100-300 mg total) by mouth at bedtime., Disp: 90 capsule, Rfl: 2 .  meloxicam (MOBIC) 15 MG tablet, Take 1 tablet (15 mg total) by mouth daily., Disp: 30 tablet, Rfl: 2  ROS  Constitutional: Denies any fever or chills Gastrointestinal: No reported hemesis, hematochezia, vomiting, or acute GI distress Musculoskeletal: Denies any acute onset joint swelling, redness, loss of ROM, or weakness Neurological: No reported episodes of acute onset apraxia, aphasia, dysarthria, agnosia, amnesia, paralysis, loss of coordination, or loss of consciousness  Allergies  Deanna Jacobson is allergic to codeine.  [place]  Drug: Deanna Jacobson  reports that she uses drugs, including Marijuana. Alcohol:  reports that she drinks alcohol. Tobacco:  reports that she has been smoking Cigarettes.  She has been smoking about 1.00 pack per day. She has never used smokeless tobacco. Medical:  has a past medical history of Chronic back pain and Hypertension. Surgical: Ms. Wannamaker  has a past surgical history that includes Breast enhancement surgery. Family: family history includes Cancer in her mother;  Hypertension in her father.  Constitutional Exam  General appearance: Well nourished, well developed, and well hydrated. In no apparent acute distress Vitals:   03/24/17 0933  BP: (!) 155/75  Pulse: 92  Resp: 16  Temp: 98.7 F (37.1 C)  TempSrc: Oral  SpO2: 100%  Weight: 104 lb (47.2 kg)  Height: '5\' 1"'  (1.549 m)   BMI Assessment: Estimated body mass index is 19.65 kg/m as calculated from the following:   Height as of this encounter: '5\' 1"'  (1.549 m).   Weight as of this encounter: 104 lb (47.2 kg). Psych/Mental status: Alert, oriented x 3 (person, place, & time)       Eyes: PERLA Respiratory: No evidence of acute respiratory distress  Cervical Spine Area Exam  Skin & Axial Inspection: No masses, redness, edema, swelling, or associated skin lesions Alignment: Symmetrical Functional ROM: Unrestricted ROM      Stability: No instability detected Muscle Tone/Strength: Functionally intact. No obvious neuro-muscular anomalies detected. Sensory (Neurological): Unimpaired Palpation: No palpable anomalies              Upper Extremity (UE) Exam    Side: Right upper extremity  Side: Left upper extremity  Skin & Extremity Inspection: Skin color, temperature, and hair growth are WNL. No peripheral edema or cyanosis. No masses, redness, swelling, asymmetry, or associated skin lesions. No contractures.  Skin & Extremity Inspection: Skin color, temperature, and hair growth are WNL. No peripheral edema or cyanosis. No masses, redness, swelling, asymmetry, or associated skin lesions. No contractures.  Functional ROM: Unrestricted ROM  Functional ROM: Unrestricted ROM          Muscle Tone/Strength: Functionally intact. No obvious neuro-muscular anomalies detected.  Muscle Tone/Strength: Functionally intact. No obvious neuro-muscular anomalies detected.  Sensory (Neurological): Unimpaired          Sensory (Neurological): Unimpaired          Palpation: No palpable anomalies               Palpation: No palpable anomalies              Specialized Test(s): Deferred         Specialized Test(s): Deferred          Thoracic Spine Area Exam  Skin & Axial Inspection: No masses, redness, or swelling Alignment: Symmetrical Functional ROM: Unrestricted ROM Stability: No instability detected Muscle Tone/Strength: Functionally intact. No obvious neuro-muscular anomalies detected. Sensory (Neurological): Unimpaired Muscle strength & Tone: No palpable anomalies  Lumbar Spine Area Exam  Skin & Axial Inspection: No masses, redness, or swelling Alignment: Symmetrical Functional ROM: Unrestricted ROM      Stability: No instability detected Muscle Tone/Strength: Functionally intact. No obvious neuro-muscular anomalies detected. Sensory (Neurological): Unimpaired Palpation: No palpable anomalies       Provocative Tests: Lumbar Hyperextension and rotation test: evaluation deferred today       Lumbar Lateral bending test: evaluation deferred today       Patrick's Maneuver: evaluation deferred today                    Gait & Posture Assessment  Ambulation: Unassisted Gait: Relatively normal for age and body habitus Posture: WNL   Lower Extremity Exam    Side: Right lower extremity  Side: Left lower extremity  Skin & Extremity Inspection: Skin color, temperature, and hair growth are WNL. No peripheral edema or cyanosis. No masses, redness, swelling, asymmetry, or associated skin lesions. No contractures.  Skin & Extremity Inspection: Skin color, temperature, and hair growth are WNL. No peripheral edema or cyanosis. No masses, redness, swelling, asymmetry, or associated skin lesions. No contractures.  Functional ROM: Unrestricted ROM          Functional ROM: Unrestricted ROM          Muscle Tone/Strength: Functionally intact. No obvious neuro-muscular anomalies detected.  Muscle Tone/Strength: Functionally intact. No obvious neuro-muscular anomalies detected.  Sensory (Neurological):  Unimpaired  Sensory (Neurological): Unimpaired  Palpation: No palpable anomalies  Palpation: No palpable anomalies   Assessment  Primary Diagnosis & Pertinent Problem List: The primary encounter diagnosis was Chronic thoracic back pain, unspecified back pain laterality. Diagnoses of Chronic thoracic spine pain (midline), Chronic pain syndrome, Musculoskeletal pain, Marijuana use, Neurogenic pain, and Primary osteoarthritis involving multiple joints were also pertinent to this visit.  Status Diagnosis  Controlled Controlled Controlled 1. Chronic thoracic back pain, unspecified back pain laterality   2. Chronic thoracic spine pain (midline)   3. Chronic pain syndrome   4. Musculoskeletal pain   5. Marijuana use   6. Neurogenic pain   7. Primary osteoarthritis involving multiple joints     Problems updated and reviewed during this visit: No problems updated. Plan of Care  Pharmacotherapy (Medications Ordered): Meds ordered this encounter  Medications  . cyclobenzaprine (FLEXERIL) 10 MG tablet    Sig: Take 1 tablet (10 mg total) by mouth every 8 (eight) hours as needed for muscle spasms.    Dispense:  90 tablet    Refill:  2    Do not add this medication  to the electronic "Automatic Refill" notification system. Patient may have prescription filled one day early if pharmacy is closed on scheduled refill date.    Order Specific Question:   Supervising Provider    Answer:   Milinda Pointer [phone]  . gabapentin (NEURONTIN) 100 MG capsule    Sig: Take 1-3 capsules (100-300 mg total) by mouth at bedtime.    Dispense:  90 capsule    Refill:  2    Do not place this medication, or any other prescription from our practice, on "Automatic Refill". Patient may have prescription filled one day early if pharmacy is closed on scheduled refill date.    Order Specific Question:   Supervising Provider    Answer:   Milinda Pointer [phone]  . meloxicam (MOBIC) 15 MG tablet    Sig: Take 1  tablet (15 mg total) by mouth daily.    Dispense:  30 tablet    Refill:  2    Do not add this medication to the electronic "Automatic Refill" notification system. Patient may have prescription filled one day early if pharmacy is closed on scheduled refill date.    Order Specific Question:   Supervising Provider    Answer:   Milinda Pointer [[id number]]   New Prescriptions   No medications on file   Medications administered today: Ms. Bas had no medications administered during this visit. Lab-work, procedure(s), and/or referral(s): No orders of the defined types were placed in this encounter.  Imaging and/or referral(s): None  Interventional therapies: Planned, scheduled, and/or pending:  RightT12, L1, L2, &L3Medial Branch Level(s)RFA as scheduled   Considering:  Palliative LeftT12, L1, L2, &L3Medial Branch Level(s)RFA (Last done on 06/30/16)   Palliative PRN treatment(s):  PalliativeT12, L1, L2, &L3Medial Branch Level(s)Block PalliativeT12, L1, L2, &L3Medial Branch Level(s)radiofrequency abla   Provider-requested follow-up: Return in about 3 months (around 06/24/2017) for MedMgmt.  Future Appointments Date Time Provider [facility]  04/14/2017 10:30 AM Milinda Pointer, MD [facility] None  06/20/2017 8:30 AM Vevelyn Francois, NP [facility] None   Primary Care Physician: [facility] [place] Location: [facility] Note by: Vevelyn Francois NP Date: 03/24/2017; Time: 10:50 AM  Pain Score Disclaimer: We use the NRS-11 scale. This is a self-reported, subjective measurement of pain severity with only modest accuracy. It is used primarily to identify changes within a particular patient. It must be understood that outpatient pain scales are significantly less accurate that those used for research, where they can be applied under ideal controlled circumstances with minimal exposure to variables. In reality,  the score is likely to be a combination of pain intensity and pain affect, where pain affect describes the degree of emotional arousal or changes in action readiness caused by the sensory experience of pain. Factors such as social and work situation, setting, emotional state, anxiety levels, expectation, and prior pain experience may influence pain perception and show large inter-individual differences that may also be affected by time variables.  Patient instructions provided during this appointment: Patient Instructions    ____________________________________________________________________________________________  Medication Rules  Applies to: All patients receiving prescriptions (written or electronic).  Pharmacy of record: Pharmacy where electronic prescriptions will be sent. If written prescriptions are taken to a different pharmacy, please inform the nursing staff. The pharmacy listed in the electronic medical record should be the one where you would like electronic prescriptions to be sent.  Prescription refills: Only during scheduled appointments. Applies to both, written and electronic prescriptions.  NOTE: The following  applies primarily to controlled substances (Opioid* Pain Medications).   Patient's responsibilities: 1. Pain Pills: Bring all pain pills to every appointment (except for procedure appointments). 2. Pill Bottles: Bring pills in original pharmacy bottle. Always bring newest bottle. Bring bottle, even if empty. 3. Medication refills: You are responsible for knowing and keeping track of what medications you need refilled. The day before your appointment, write a list of all prescriptions that need to be refilled. Bring that list to your appointment and give it to the admitting nurse. Prescriptions will be written only during appointments. If you forget a medication, it will not be "Called in", "Faxed", or "electronically sent". You will need to get another appointment to  get these prescribed. 4. Prescription Accuracy: You are responsible for carefully inspecting your prescriptions before leaving our office. Have the discharge nurse carefully go over each prescription with you, before taking them home. Make sure that your name is accurately spelled, that your address is correct. Check the name and dose of your medication to make sure it is accurate. Check the number of pills, and the written instructions to make sure they are clear and accurate. Make sure that you are given enough medication to last until your next medication refill appointment. 5. Taking Medication: Take medication as prescribed. Never take more pills than instructed. Never take medication more frequently than prescribed. Taking less pills or less frequently is permitted and encouraged, when it comes to controlled substances (written prescriptions).  6. Inform other Doctors: Always inform, all of your healthcare providers, of all the medications you take. 7. Pain Medication from other Providers: You are not allowed to accept any additional pain medication from any other Doctor or Healthcare provider. There are two exceptions to this rule. (see below) In the event that you require additional pain medication, you are responsible for notifying us, as stated below. 8. Medication Agreement: You are responsible for carefully reading and following our Medication Agreement. This must be signed before receiving any prescriptions from our practice. Safely store a copy of your signed Agreement. Violations to the Agreement will result in no further prescriptions. (Additional copies of our Medication Agreement are available upon request.) 9. Laws, Rules, & Regulations: All patients are expected to follow all Federal and [employer], Rules, [employer]. Ignorance of the Laws does not constitute a valid excuse. The use of any illegal substances is prohibited. 10. Adopted CDC guidelines & recommendations: Target  dosing levels will be at or below 60 MME/day. Use of benzodiazepines** is not recommended.  Exceptions: There are only two exceptions to the rule of not receiving pain medications from other Healthcare Providers. 1. Exception #1 (Emergencies): In the event of an emergency (i.e.: accident requiring emergency care), you are allowed to receive additional pain medication. However, you are responsible for: As soon as you are able, call our office [phone], at any time of the day or night, and leave a message stating your name, the date and nature of the emergency, and the name and dose of the medication prescribed. In the event that your call is answered by a member of our staff, make sure to document and save the date, time, and the name of the person that took your information.  2. Exception #2 (Planned Surgery): In the event that you are scheduled by another doctor or dentist to have any type of surgery or procedure, you are allowed (for a period no longer than 30 days), to receive additional pain medication, for the  acute post-op pain. However, in this case, you are responsible for picking up a copy of our "Post-op Pain Management for Surgeons" handout, and giving it to your surgeon or dentist. This document is available at our office, and does not require an appointment to obtain it. Simply go to our office during business hours (Monday-Thursday from 8:00 AM to 4:00 PM) (Friday 8:00 AM to 12:00 Noon) or if you have a scheduled appointment with [place], prior to your surgery, and ask for it by name. In addition, you will need to provide [place] with your name, name of your surgeon, type of surgery, and date of procedure or surgery.  *Opioid medications include: morphine, codeine, oxycodone, oxymorphone, hydrocodone, hydromorphone, meperidine, tramadol, tapentadol, buprenorphine, fentanyl, methadone. **Benzodiazepine medications include: diazepam (Valium), alprazolam (Xanax), clonazepam (Klonopine), lorazepam  (Ativan), clorazepate (Tranxene), chlordiazepoxide (Librium), estazolam (Prosom), oxazepam (Serax), temazepam (Restoril), triazolam (Halcion)  ____________________________________________________________________________________________ Patient's Name: Omeka Holben  03/24/2017  [place] [address]    DOB: 1955-10-28    MRN: [id number]        Re: Urine Drug Screening Test Results.     Dear Deanna Jacobson:  The Test: We do not use a POC (Point-of-care) immunoassay testing urine drug screen (UDS) test, due to the fact that for illicit drugs, the false-positive rate is 0% for cocaine, 2% for marijuana, 0.9% for amphetamines, and 1.2% for methamphetamines. Instead, our facility uses a forensic state-of-the-art ultra-high performance liquid chromatography and mass spectrometry system (UPLC/MS-MS), the most sophisticated and accurate method currently available. It is 1,000 times more precise and accurate than standard gas chromatography and mass spectrometry (GC/MS) testing. This is the test used to detect false positive and false negative results in immunoassay testing. The system can analyze 26 drug categories and 180 drug compounds. It will not only detect the presence of a parent compound its break down products (metabolites), but it will also provide the exact amounts, down to nanograms (1/1,000,000 of a milligram).  Findings: Findings (results) of this test are reported as abnormal or unexpected when there is a discrepancy or inconsistency between the expected results and those detected during testing. Expectations are based on the medication history provided by the patient at the time of sample collection.  Abnormal Results:  These generally fall under one of two primary categories: Category 1: Substances reported to be taken, but found to be absent: If you are being prescribed a pain medication, it should be detected in your urine sample. Not detecting the medication  indicates that the medication is not in the system and has not been taken for a couple of days. Abnormal results under this category may suggest one of the following two possible medication agreement violation(s): 1). Opioid diversion:  (Selling, lending, or giving medications to an individual for whom the medication was not intended or prescribed.) 2). Non-compliance with prescription instructions on how to take the medication. (Taking more medication than allowed.) Category 2: An unreported substance is found to be present: These results may suggest the following medication agreement violation(s): 1). The illegal use of a substance. This includes the use of illegal substances (such as cocaine, marijuana, PCP, etc.) or the illegal use of a legal prescription drug (such as using a medication that was not prescribed to you or for you; or purchasing a prescription drug from a street dealer or a friend). 2). Obtaining medications from multiple sources. (i.e.: "Doctor shopping")  Medication Policy: When it comes to illicit or illegal use, I have a "Zero  Tolerance" policy.  Your Results: THC  Determination: The findings of this UDT are unacceptable and incompatible with appropriate, safe, and responsible use of controlled substances. These results are  incompatible with our medication policy, as well as our opioid treatment agreement. This represents non-compliance with the safe use of controlled substances.  To ensure your safety, I am modifying you treatment plan as follows:  New Treatment Plan: Effective immediately, you are no longer a candidate for opioid, or opioid-related medication management. This means that effective today, I will not be prescribing any opioid or opioid-related pain medication as part of your treatment. Your new treatment plan will exclude the use of any and all controlled substances. This decision  is based on my belief that the risks outweigh the benefits.  Appeals: This  decision is final, irreversible, and not open for debate. This represents a change in your treatment plan and not a discharge from our practice. Should you decide that what we have to offer is not in accordance to your wishes, you have the right to transfer your care to another practice or facility. From this point on, we will remain available to you for injection therapies only.  If you are already taking pain medications, I recommend that you immediately begin tapering them down, so as to stop them. I recommend decreasing the number of pills you take on a daily basis, by one pill every seven (7) days. If you do not believe that you can do this on your own, please contact one of the following drug rehabilitation centers below:  [facility] [address],  [place], [place] [address] Tel.: [phone]  [facility] [address], [place], [place] [address] Tel.: [phone]      [facility]) [place], [place], [place] [address] Tel.: [phone]  [facility] [address] [place], [place] [address] Tel.: [phone]      [facility] [address], [place], [place] [address] Tel.: [phone]  [place], [place] [address] Tel.: [phone]    Alternatively, if you would like to get our assistance in tapering down and stopping your pain medications, please contact Cherly Anderson, RN, our practice coordinator, to make arrangements for this. Tel.: [phone].  Most sincerely,  ________________________ Dionisio David NP

## 2017-03-29 ENCOUNTER — Ambulatory Visit: Payer: BLUE CROSS/BLUE SHIELD | Admitting: Pain Medicine

## 2017-04-14 ENCOUNTER — Ambulatory Visit: Payer: BLUE CROSS/BLUE SHIELD | Admitting: Pain Medicine

## 2017-04-14 DIAGNOSIS — M47816 Spondylosis without myelopathy or radiculopathy, lumbar region: Secondary | ICD-10-CM | POA: Insufficient documentation

## 2017-04-14 NOTE — Progress Notes (Deleted)
Patient's Name: Deanna Jacobson  MRN: [id number]  Referring Provider: Vevelyn Francois, NP  DOB: 24-Aug-1955  [facility]  DOS: 04/14/2017  Note by: Gaspar Cola, MD  Service setting: Ambulatory outpatient  Specialty: Interventional Pain Management  Patient type: Established  Location: [facility] (AMB) Pain Management Facility  Visit type: Interventional Procedure   Primary Reason for Visit: Interventional Pain Management Treatment. CC: No chief complaint on file.  Procedure:  Anesthesia, Analgesia, Anxiolysis:  Type: Therapeutic Thoracic Medial Nerve Radiofrequency Ablation Region: Posterior Thoracolumbar Level: T12, L1, L2, &L3Medial Branch Level(s) Laterality: Right-Sided Paraspinal  Type: Local Anesthesia with Moderate (Conscious) Sedation Local Anesthetic: Lidocaine 1% Route: Intravenous (IV) IV Access: Secured Sedation: Meaningful verbal contact was maintained at all times during the procedure  Indication(s): Analgesia and Anxiety   Indications: No diagnosis found. Deanna Jacobson has either failed to respond, was unable to tolerate, or simply did not get enough benefit from other more conservative therapies including, but not limited to: 1. Over-the-counter medications 2. Anti-inflammatory medications 3. Muscle relaxants 4. Membrane stabilizers 5. Opioids 6. Physical therapy 7. Modalities (Heat, ice, etc.) 8. Invasive techniques such as nerve blocks. Deanna Jacobson has attained more than 50% relief of the pain from a series of diagnostic injections conducted in separate occasions.  Pain Score: Pre-procedure:  /10 Post-procedure:  /10  Pre-op Assessment:  Deanna Jacobson is a 61 y.o. (year old), female patient, seen today for interventional treatment. She  has a past surgical history that includes Breast enhancement surgery. Deanna Jacobson has a current medication list which includes the following prescription(s): amlodipine,  cyclobenzaprine, duloxetine, gabapentin, hydroxyzine, lisinopril, meloxicam, tramadol, and trazodone. Her primarily concern today is the No chief complaint on file.  Initial Vital Signs: There were no vitals taken for this visit. BMI: Estimated body mass index is 19.65 kg/m as calculated from the following:   Height as of 03/24/17: 5\' 1"  (1.549 m).   Weight as of 03/24/17: 104 lb (47.2 kg).  Risk Assessment: Allergies: Reviewed. She is allergic to codeine.  Allergy Precautions: None required Coagulopathies: Reviewed. None identified.  Blood-thinner therapy: None at this time Active Infection(s): Reviewed. None identified. Deanna Jacobson is afebrile  Site Confirmation: Deanna Jacobson was asked to confirm the procedure and laterality before marking the site Procedure checklist: Completed Consent: Before the procedure and under the influence of no sedative(s), amnesic(s), or anxiolytics, the patient was informed of the treatment options, risks and possible complications. To fulfill our ethical and legal obligations, as recommended by the American Medical Association's Code of Ethics, I have informed the patient of my clinical impression; the nature and purpose of the treatment or procedure; the risks, benefits, and possible complications of the intervention; the alternatives, including doing nothing; the risk(s) and benefit(s) of the alternative treatment(s) or procedure(s); and the risk(s) and benefit(s) of doing nothing. The patient was provided information about the general risks and possible complications associated with the procedure. These may include, but are not limited to: failure to achieve desired goals, infection, bleeding, organ or nerve damage, allergic reactions, paralysis, and death. In addition, the patient was informed of those risks and complications associated to Spine-related procedures, such as failure to decrease pain; infection (i.e.: Meningitis, epidural or intraspinal  abscess); bleeding (i.e.: epidural hematoma, subarachnoid hemorrhage, or any other type of intraspinal or peri-dural bleeding); organ or nerve damage (i.e.: Any type of peripheral nerve, nerve root, or spinal cord injury) with subsequent damage to sensory, motor, and/or autonomic systems, resulting in  permanent pain, numbness, and/or weakness of one or several areas of the body; allergic reactions; (i.e.: anaphylactic reaction); and/or death. Furthermore, the patient was informed of those risks and complications associated with the medications. These include, but are not limited to: allergic reactions (i.e.: anaphylactic or anaphylactoid reaction(s)); adrenal axis suppression; blood sugar elevation that in diabetics may result in ketoacidosis or comma; water retention that in patients with history of congestive heart failure may result in shortness of breath, pulmonary edema, and decompensation with resultant heart failure; weight gain; swelling or edema; medication-induced neural toxicity; particulate matter embolism and blood vessel occlusion with resultant organ, and/or nervous system infarction; and/or aseptic necrosis of one or more joints. Finally, the patient was informed that Medicine is not an exact science; therefore, there is also the possibility of unforeseen or unpredictable risks and/or possible complications that may result in a catastrophic outcome. The patient indicated having understood very clearly. We have given the patient no guarantees and we have made no promises. Enough time was given to the patient to ask questions, all of which were answered to the patient's satisfaction. Deanna Jacobson has indicated that she wanted to continue with the procedure. Attestation: I, the ordering provider, attest that I have discussed with the patient the benefits, risks, side-effects, alternatives, likelihood of achieving goals, and potential problems during recovery for the procedure that I have provided  informed consent. Date: 04/14/2017; Time: 7:48 AM  Pre-Procedure Preparation:  Monitoring: As per clinic protocol. Respiration, ETCO2, SpO2, BP, heart rate and rhythm monitor placed and checked for adequate function Safety Precautions: Patient was assessed for positional comfort and pressure points before starting the procedure. Time-out: I initiated and conducted the "Time-out" before starting the procedure, as per protocol. The patient was asked to participate by confirming the accuracy of the "Time Out" information. Verification of the correct person, site, and procedure were performed and confirmed by me, the nursing staff, and the patient. "Time-out" conducted as per Joint Commission's Universal Protocol (UP.01.01.01). "Time-out" Date & Time: 04/14/2017;   hrs.  Description of Procedure Process:   Position: Prone Target Area: For Lumbar Facet block(s), the target is the groove formed by the junction of the transverse process and superior articular process. For the L5 dorsal ramus, the target is the notch between superior articular process and sacral ala. For the S1 dorsal ramus, the target is the superior and lateral edge of the posterior S1 Sacral foramen. Approach: Paraspinal approach. Area Prepped: Entire Upper Back Area Prepping solution: Hibiclens (4.0% Chlorhexidine gluconate solution) Safety Precautions: Aspiration looking for blood return was conducted prior to all injections. At no point did we inject any substances, as a needle was being advanced. No attempts were made at seeking any paresthesias. Safe injection practices and needle disposal techniques used. Medications properly checked for expiration dates. SDV (single dose vial) medications used. Description of the Procedure: Protocol guidelines were followed. The patient was placed in position over the procedure table. The target area was identified and the area prepped in the usual manner. The skin and muscle were infiltrated with  local anesthetic. Appropriate amount of time allowed to pass for local anesthetics to take effect. Radiofrequency needles were introduced to the target area using fluoroscopic guidance. Using the NeuroTherm NT1100 Radiofrequency Generator, sensory stimulation using 50 Hz was used to locate & identify the nerve, making sure that the needle was positioned such that there was no sensory stimulation below 0.3 V or above 0.7 V. Stimulation using 2 Hz was used to  evaluate the motor component. Care was taken not to lesion any nerves that demonstrated motor stimulation of the lower extremities at an output of less than 2.5 times that of the sensory threshold, or a maximum of 2.0 V. Once satisfactory placement of the needles was achieved, the numbing solution was slowly injected after negative aspiration. After waiting for at least 2 minutes, the ablation was performed at 80 degrees C for 60 seconds, using regular Radiofrequency settings. Once the procedure was completed, the needles were then removed and the area cleansed, making sure to leave some of the prepping solution back to take advantage of its long term bactericidal properties. Intra-operative Compliance: Compliant There were no vitals filed for this visit.  Start Time:   hrs. End Time:   hrs. Materials & Medications:  Needle(s) Type: Teflon-coated, curved tip, Radiofrequency needle(s) Gauge: 22G Length: 10cm Medication(s): Deanna Jacobson had no medications administered during this visit. Please see chart orders for dosing details.  Imaging Guidance (Spinal):  Type of Imaging Technique: Fluoroscopy Guidance (Spinal) Indication(s): Assistance in needle guidance and placement for procedures requiring needle placement in or near specific anatomical locations not easily accessible without such assistance. Exposure Time: Please see nurses notes. Contrast: None used. Fluoroscopic Guidance: I was personally present during the use of fluoroscopy. "Tunnel  Vision Technique" used to obtain the best possible view of the target area. Parallax error corrected before commencing the procedure. "Direction-depth-direction" technique used to introduce the needle under continuous pulsed fluoroscopy. Once target was reached, antero-posterior, oblique, and lateral fluoroscopic projection used confirm needle placement in all planes. Images permanently stored in EMR. Interpretation: No contrast injected. I personally interpreted the imaging intraoperatively. Adequate needle placement confirmed in multiple planes. Permanent images saved into the patient's record.  Antibiotic Prophylaxis:  Indication(s): None identified Antibiotic given: None  Post-operative Assessment:  EBL: None Complications: No immediate post-treatment complications observed by team, or reported by patient. Note: The patient tolerated the entire procedure well. A repeat set of vitals were taken after the procedure and the patient was kept under observation following institutional policy, for this type of procedure. Post-procedural neurological assessment was performed, showing return to baseline, prior to discharge. The patient was provided with post-procedure discharge instructions, including a section on how to identify potential problems. Should any problems arise concerning this procedure, the patient was given instructions to immediately contact us, at any time, without hesitation. In any case, we plan to contact the patient by telephone for a follow-up status report regarding this interventional procedure. Comments:  No additional relevant information.  Plan of Care   Imaging Orders  No imaging studies ordered today   Procedure Orders    No procedure(s) ordered today    Medications ordered for procedure: No orders of the defined types were placed in this encounter.  Medications administered: Deanna Jacobson had no medications administered during this visit.  See the medical  record for exact dosing, route, and time of administration.  This SmartLink is deprecated. Use AVSMEDLIST instead to display the medication list for a patient. Disposition: Discharge home  Discharge Date & Time: 04/14/2017;   hrs.   Physician-requested Follow-up: No Follow-up on file. Future Appointments  Date Time Provider [place]  04/14/2017 10:30 AM Milinda Pointer, MD [facility] None  06/20/2017  8:30 AM Vevelyn Francois, NP [facility] None   Primary Care Physician: [facility] [place] Location: [facility] Note by: Gaspar Cola, MD Date: 04/14/2017; Time: 7:48 AM  Disclaimer:  Medicine is not an Chief Strategy Officer. The only guarantee in medicine is that nothing is guaranteed. It is important to note that the decision to proceed with this intervention was based on the information collected from the patient. The Data and conclusions were drawn from the patient's questionnaire, the interview, and the physical examination. Because the information was provided in large part by the patient, it cannot be guaranteed that it has not been purposely or unconsciously manipulated. Every effort has been made to obtain as much relevant data as possible for this evaluation. It is important to note that the conclusions that lead to this procedure are derived in large part from the available data. Always take into account that the treatment will also be dependent on availability of resources and existing treatment guidelines, considered by other Pain Management Practitioners as being common knowledge and practice, at the time of the intervention. For Medico-Legal purposes, it is also important to point out that variation in procedural techniques and pharmacological choices are the acceptable norm. The indications, contraindications, technique, and results of the above procedure should only be interpreted and judged by a Board-Certified Interventional  Pain Specialist with extensive familiarity and expertise in the same exact procedure and technique.

## 2017-06-20 ENCOUNTER — Ambulatory Visit: Payer: BLUE CROSS/BLUE SHIELD | Admitting: Nurse Practitioner

## 2017-06-21 ENCOUNTER — Encounter: Payer: Self-pay | Admitting: Nurse Practitioner

## 2017-06-21 ENCOUNTER — Ambulatory Visit: Payer: BLUE CROSS/BLUE SHIELD | Attending: Nurse Practitioner | Admitting: Nurse Practitioner

## 2017-06-21 ENCOUNTER — Other Ambulatory Visit: Payer: Self-pay

## 2017-06-21 DIAGNOSIS — M47814 Spondylosis without myelopathy or radiculopathy, thoracic region: Secondary | ICD-10-CM | POA: Insufficient documentation

## 2017-06-21 DIAGNOSIS — Z8249 Family history of ischemic heart disease and other diseases of the circulatory system: Secondary | ICD-10-CM | POA: Insufficient documentation

## 2017-06-21 DIAGNOSIS — Z79899 Other long term (current) drug therapy: Secondary | ICD-10-CM | POA: Diagnosis not present

## 2017-06-21 DIAGNOSIS — N179 Acute kidney failure, unspecified: Secondary | ICD-10-CM | POA: Insufficient documentation

## 2017-06-21 DIAGNOSIS — Z79891 Long term (current) use of opiate analgesic: Secondary | ICD-10-CM | POA: Insufficient documentation

## 2017-06-21 DIAGNOSIS — Z791 Long term (current) use of non-steroidal anti-inflammatories (NSAID): Secondary | ICD-10-CM | POA: Insufficient documentation

## 2017-06-21 DIAGNOSIS — M150 Primary generalized (osteo)arthritis: Secondary | ICD-10-CM

## 2017-06-21 DIAGNOSIS — Z885 Allergy status to narcotic agent status: Secondary | ICD-10-CM | POA: Insufficient documentation

## 2017-06-21 DIAGNOSIS — M7918 Myalgia, other site: Secondary | ICD-10-CM

## 2017-06-21 DIAGNOSIS — G894 Chronic pain syndrome: Secondary | ICD-10-CM | POA: Diagnosis not present

## 2017-06-21 DIAGNOSIS — M1288 Other specific arthropathies, not elsewhere classified, other specified site: Secondary | ICD-10-CM | POA: Insufficient documentation

## 2017-06-21 DIAGNOSIS — M546 Pain in thoracic spine: Secondary | ICD-10-CM | POA: Diagnosis not present

## 2017-06-21 DIAGNOSIS — M549 Dorsalgia, unspecified: Secondary | ICD-10-CM | POA: Diagnosis present

## 2017-06-21 DIAGNOSIS — M47815 Spondylosis without myelopathy or radiculopathy, thoracolumbar region: Secondary | ICD-10-CM | POA: Diagnosis not present

## 2017-06-21 DIAGNOSIS — M488X4 Other specified spondylopathies, thoracic region: Secondary | ICD-10-CM | POA: Insufficient documentation

## 2017-06-21 DIAGNOSIS — M792 Neuralgia and neuritis, unspecified: Secondary | ICD-10-CM

## 2017-06-21 DIAGNOSIS — I10 Essential (primary) hypertension: Secondary | ICD-10-CM | POA: Insufficient documentation

## 2017-06-21 DIAGNOSIS — Z5181 Encounter for therapeutic drug level monitoring: Secondary | ICD-10-CM | POA: Diagnosis not present

## 2017-06-21 DIAGNOSIS — M159 Polyosteoarthritis, unspecified: Secondary | ICD-10-CM

## 2017-06-21 DIAGNOSIS — F17210 Nicotine dependence, cigarettes, uncomplicated: Secondary | ICD-10-CM | POA: Diagnosis not present

## 2017-06-21 MED ORDER — CYCLOBENZAPRINE HCL 10 MG PO TABS: 10.0000 mg | ORAL_TABLET | Freq: Three times a day (TID) | ORAL | 2 refills | Status: DC | PRN

## 2017-06-21 MED ORDER — MELOXICAM 15 MG PO TABS: 15.0000 mg | ORAL_TABLET | Freq: Every day | ORAL | 2 refills | Status: DC

## 2017-06-21 MED ORDER — GABAPENTIN 100 MG PO CAPS: 100.0000 mg | ORAL_CAPSULE | Freq: Every day | ORAL | 2 refills | Status: DC

## 2017-06-21 NOTE — Progress Notes (Signed)
Safety precautions to be maintained throughout the outpatient stay will include: orient to surroundings, keep bed in low position, maintain call bell within reach at all times, provide assistance with transfer out of bed and ambulation.  

## 2017-06-21 NOTE — Addendum Note (Signed)
Addended by: Vevelyn Francois on: 06/21/2017 03:25 PM   Modules accepted: Level of Service

## 2017-06-21 NOTE — Progress Notes (Signed)
Patient's Name: Deanna Jacobson  MRN: [id number]  Referring Provider: [facility]  DOB: 1956-02-08  PCP: [facility], [place]  DOS: 06/21/2017  Note by: Vevelyn Francois NP  Service setting: Ambulatory outpatient  Specialty: Interventional Pain Management  Location: [facility] (AMB) Pain Management Facility    Patient type: Established    Primary Reason(s) for Visit: Encounter for prescription drug management. (Level of risk: moderate)  CC: Back Pain (mid)  HPI  Deanna Jacobson is a 62 y.o. year old, female patient, who comes today for a medication management evaluation. She has Acute renal failure (ARF) ([place]); Long term current use of opiate analgesic; Long term prescription opiate use; Opiate use; Encounter for therapeutic drug level monitoring; Encounter for pain management planning; Chronic mid back pain; Chronic thoracic spine pain (midline); Thoracic facet syndrome (Bilateral); Lumbar facet arthropathy ([place]); Musculoskeletal pain; Neurogenic pain; Osteoarthritis; Chronic pain syndrome; Thoracic spondylosis; Thoracolumbar spondylosis; Marijuana use; and Lumbar facet joint syndrome (Bilateral) on their problem list. Her primarily concern today is the Back Pain (mid)  Pain Assessment: Location: Medial, Mid Back Radiating: denies Onset: More than a month ago Duration: Chronic pain Severity: 2 /10 (self-reported pain score)  Note: Reported level is compatible with observation.                          Effect on ADL:   Timing: Constant Modifying factors: heat, not currently taking any pain medication  Deanna Jacobson was last scheduled for an appointment on 03/24/2017 for medication management. During today's appointment we reviewed Deanna Jacobson's chronic pain status, as well as her outpatient medication regimen. She states that she is not using any pain medications. She was not aware that she had refills on her Gabapentin , Cyclobenzaprine and Meloxicam.  She was  taken off to the Tramadol secondary to continued THC use. She states that she continues to use the Hemp Oil. She states that she is able to work however the month of December she was off and was in a lot of pain.   The patient  reports that she uses drugs. Drug: Marijuana. Her body mass index is 19.46 kg/m.  Further details on both, my assessment(s), as well as the proposed treatment plan, please see below.  Controlled Substance Pharmacotherapy Assessment REMS (Risk Evaluation and Mitigation Strategy)  Analgesic: None MME/day: 0 mg/day.  Rise Patience  06/21/2017 10:24 AM  Signed Safety precautions to be maintained throughout the outpatient stay will include: orient to surroundings, keep bed in low position, maintain call bell within reach at all times, provide assistance with transfer out of bed and ambulation.    Pharmacokinetics: Liberation and absorption (onset of action): WNL Distribution (time to peak effect): WNL Metabolism and excretion (duration of action): WNL         Pharmacodynamics: Desired effects: Analgesia: Deanna Jacobson reports >50% benefit. Functional ability: Patient reports that medication allows her to accomplish basic ADLs Clinically meaningful improvement in function (CMIF): Sustained CMIF goals met Perceived effectiveness: Described as relatively effective, allowing for increase in activities of daily living (ADL) Undesirable effects: Side-effects or Adverse reactions: None reported Monitoring: [place] PMP: Online review of the past 57-monthperiod conducted. Compliant with practice rules and regulations Last UDS on record: Summary  Date Value Ref Range Status  02/02/2017 FINAL  Final    Comment:    ==================================================================== TOXASSURE SELECT 13 (MW) ==================================================================== Test  Result       Flag       Units Drug Present and Declared for  Prescription Verification   Tramadol                       >4000        EXPECTED   ng/mg creat   O-Desmethyltramadol            >4000        EXPECTED   ng/mg creat   N-Desmethyltramadol            421          EXPECTED   ng/mg creat    Source of tramadol is a prescription medication.    O-desmethyltramadol and N-desmethyltramadol are expected    metabolites of tramadol. Drug Present not Declared for Prescription Verification   Carboxy-THC                    33           UNEXPECTED ng/mg creat    Carboxy-THC is a metabolite of tetrahydrocannabinol  (THC).    Source of [facility] is most commonly illicit, but THC is also present    in a scheduled prescription medication. ==================================================================== Test                      Result    Flag   Units      Ref Range   Creatinine              125              mg/dL      >=20 ==================================================================== Declared Medications:  The flagging and interpretation on this report are based on the  following declared medications.  Unexpected results may arise from  inaccuracies in the declared medications.  **Note: The testing scope of this panel includes these medications:  Tramadol  **Note: The testing scope of this panel does not include following  reported medications:  Cyclobenzaprine  Gabapentin  Lisinopril  Meloxicam  Trazodone ==================================================================== For clinical consultation, please call [phone]. ====================================================================    UDS interpretation: Compliant          Medication Assessment Form: Reviewed. Patient indicates being compliant with therapy Treatment compliance: Compliant Risk Assessment Profile: Aberrant behavior: See prior evaluations. None observed or detected today Comorbid factors increasing risk of overdose: See prior notes. No additional risks detected  today Risk of substance use disorder (SUD): Low Opioid Risk Tool - 06/21/17 1021      Family History of Substance Abuse   Alcohol  Negative    Illegal Drugs  Negative    Rx Drugs  Negative      Personal History of Substance Abuse   Alcohol  Negative    Illegal Drugs  Negative    Rx Drugs  Negative      Age   Age between 80-45 years   No      Psychological Disease   Psychological Disease  Negative    Depression  Negative      Total Score   Opioid Risk Tool Scoring  0    Opioid Risk Interpretation  Low Risk      ORT Scoring interpretation table:  Score <3 = Low Risk for SUD  Score between 4-7 = Moderate Risk for SUD  Score >8 = High Risk for Opioid Abuse   Risk Mitigation Strategies:  Patient Counseling: Covered  Patient-Prescriber Agreement (PPA): Present and active  Notification to other healthcare providers: Done  Pharmacologic Plan: No change in therapy, at this time.             Laboratory Chemistry  Inflammation Markers (CRP: Acute Phase) (ESR: Chronic Phase) Lab Results  Component Value Date   CRP 0.4 12/23/2016   ESRSEDRATE 7 12/23/2016                 Rheumatology Markers No results found for: Elayne Guerin, [facility]              Renal Function Markers Lab Results  Component Value Date   BUN 9 02/02/2017   CREATININE 0.69 02/02/2017   GFRAA >60 02/02/2017   GFRNONAA >60 02/02/2017                 Hepatic Function Markers Lab Results  Component Value Date   AST 15 02/02/2017   ALT 12 (L) 02/02/2017   ALBUMIN 3.2 (L) 02/02/2017   ALKPHOS 75 02/02/2017                 Electrolytes Lab Results  Component Value Date   NA 138 02/02/2017   K 3.9 02/02/2017   CL 104 02/02/2017   CALCIUM 9.0 02/02/2017   MG 1.9 12/23/2016                 Neuropathy Markers Lab Results  Component Value Date   VITAMINB12 734 12/23/2016                 Bone Pathology Markers Lab Results  Component Value Date   25OHVITD1 49  12/23/2016   25OHVITD2 3.2 12/23/2016   25OHVITD3 46 12/23/2016                 Coagulation Parameters Lab Results  Component Value Date   PLT 325 12/23/2016                 Cardiovascular Markers Lab Results  Component Value Date   TROPONINI <0.03 02/01/2016   HGB 12.8 12/23/2016   HCT 39.9 12/23/2016                 CA Markers No results found for: CEA, CA125, LABCA2               Note: Lab results reviewed.  Recent Diagnostic Imaging Results  DG C-Arm 1-60 Min-No Report Fluoroscopy was utilized by the requesting physician.  No radiographic  interpretation.   Complexity Note: Imaging results reviewed. Results shared with Ms. Steinberg, using Layman's terms.                         Meds   Current Outpatient Medications:  .  amLODipine (NORVASC) 2.5 MG tablet, Take 2.5 mg by mouth., Disp: , Rfl:  .  Aspirin-Acetaminophen (GOODYS BODY PAIN PO), Take by mouth 2 (two) times daily as needed., Disp: , Rfl:  .  cyclobenzaprine (FLEXERIL) 10 MG tablet, Take 1 tablet (10 mg total) by mouth every 8 (eight) hours as needed for muscle spasms., Disp: 90 tablet, Rfl: 2 .  gabapentin (NEURONTIN) 100 MG capsule, Take 1-3 capsules (100-300 mg total) by mouth at bedtime., Disp: 90 capsule, Rfl: 2 .  hydrOXYzine (ATARAX/VISTARIL) 10 MG tablet, Take 10 mg by mouth., Disp: , Rfl:  .  lisinopril (PRINIVIL,ZESTRIL) 20 MG tablet, Take 1 tablet (20 mg total) by mouth daily., Disp: 30 tablet, Rfl:  2 .  traZODone (DESYREL) 100 MG tablet, Take 100 mg by mouth at bedtime. , Disp: , Rfl: 4 .  meloxicam (MOBIC) 15 MG tablet, Take 1 tablet (15 mg total) by mouth daily., Disp: 30 tablet, Rfl: 2  ROS  Constitutional: Denies any fever or chills Gastrointestinal: No reported hemesis, hematochezia, vomiting, or acute GI distress Musculoskeletal: Denies any acute onset joint swelling, redness, loss of ROM, or weakness Neurological: No reported episodes of acute onset apraxia, aphasia, dysarthria,  agnosia, amnesia, paralysis, loss of coordination, or loss of consciousness  Allergies  Ms. Greulich is allergic to codeine.  [place]  Drug: Ms. Finken  reports that she uses drugs. Drug: Marijuana. Alcohol:  reports that she drinks alcohol. Tobacco:  reports that she has been smoking cigarettes.  She has been smoking about 1.00 pack per day. she has never used smokeless tobacco. Medical:  has a past medical history of Chronic back pain and Hypertension. Surgical: Ms. Bloch  has a past surgical history that includes Breast enhancement surgery. Family: family history includes Cancer in her mother; Hypertension in her father.  Constitutional Exam  General appearance: Well nourished, well developed, and well hydrated. In no apparent acute distress Vitals:   06/21/17 1010  BP: 128/63  Pulse: 81  Resp: 16  Temp: 98.1 F (36.7 C)  TempSrc: Oral  SpO2: 100%  Weight: 103 lb (46.7 kg)  Height: '5\' 1"'  (1.549 m)   BMI Assessment: Estimated body mass index is 19.46 kg/m as calculated from the following:   Height as of this encounter: '5\' 1"'  (1.549 m).   Weight as of this encounter: 103 lb (46.7 kg). Psych/Mental status: Alert, oriented x 3 (person, place, & time)       Eyes: PERLA Respiratory: No evidence of acute respiratory distress  Lumbar Spine Area Exam  Skin & Axial Inspection: No masses, redness, or swelling Alignment: Symmetrical Functional ROM: Unrestricted ROM      Stability: No instability detected Muscle Tone/Strength: Functionally intact. No obvious neuro-muscular anomalies detected. Sensory (Neurological): Unimpaired Palpation: Complains of area being tender to palpation       Provocative Tests: Lumbar Hyperextension and rotation test: evaluation deferred today       Lumbar Lateral bending test: evaluation deferred today       Patrick's Maneuver: evaluation deferred today                    Gait & Posture Assessment  Ambulation: Unassisted Gait: Relatively  normal for age and body habitus Posture: WNL   Lower Extremity Exam    Side: Right lower extremity  Side: Left lower extremity  Skin & Extremity Inspection: Skin color, temperature, and hair growth are WNL. No peripheral edema or cyanosis. No masses, redness, swelling, asymmetry, or associated skin lesions. No contractures.  Skin & Extremity Inspection: Skin color, temperature, and hair growth are WNL. No peripheral edema or cyanosis. No masses, redness, swelling, asymmetry, or associated skin lesions. No contractures.  Functional ROM: Unrestricted ROM          Functional ROM: Unrestricted ROM          Muscle Tone/Strength: Functionally intact. No obvious neuro-muscular anomalies detected.  Muscle Tone/Strength: Functionally intact. No obvious neuro-muscular anomalies detected.  Sensory (Neurological): Unimpaired  Sensory (Neurological): Unimpaired  Palpation: No palpable anomalies  Palpation: No palpable anomalies   Assessment  Primary Diagnosis & Pertinent Problem List: Diagnoses of Musculoskeletal pain, Neurogenic pain, and Primary osteoarthritis involving multiple joints were pertinent to this visit.  Status Diagnosis  Controlled Controlled Controlled 1. Musculoskeletal pain   2. Neurogenic pain   3. Primary osteoarthritis involving multiple joints     Problems updated and reviewed during this visit: No problems updated. Plan of Care  Pharmacotherapy (Medications Ordered): Meds ordered this encounter  Medications  . cyclobenzaprine (FLEXERIL) 10 MG tablet    Sig: Take 1 tablet (10 mg total) by mouth every 8 (eight) hours as needed for muscle spasms.    Dispense:  90 tablet    Refill:  2    Do not add this medication to the electronic "Automatic Refill" notification system. Patient may have prescription filled one day early if pharmacy is closed on scheduled refill date.    Order Specific Question:   Supervising Provider    Answer:   Milinda Pointer [phone]  . gabapentin  (NEURONTIN) 100 MG capsule    Sig: Take 1-3 capsules (100-300 mg total) by mouth at bedtime.    Dispense:  90 capsule    Refill:  2    Do not place this medication, or any other prescription from our practice, on "Automatic Refill". Patient may have prescription filled one day early if pharmacy is closed on scheduled refill date.    Order Specific Question:   Supervising Provider    Answer:   Milinda Pointer [phone]  . meloxicam (MOBIC) 15 MG tablet    Sig: Take 1 tablet (15 mg total) by mouth daily.    Dispense:  30 tablet    Refill:  2    Do not add this medication to the electronic "Automatic Refill" notification system. Patient may have prescription filled one day early if pharmacy is closed on scheduled refill date.    Order Specific Question:   Supervising Provider    Answer:   Milinda Pointer [[id number]]   New Prescriptions   No medications on file   Medications administered today: Asjia Berrios had no medications administered during this visit. Lab-work, procedure(s), and/or referral(s): No orders of the defined types were placed in this encounter.  Imaging and/or referral(s): None  Interventional therapies: Planned, scheduled, and/or pending:  Not at this time.   Considering:  Palliative LeftT12, L1, L2, &L3Medial Branch Level(s)RFA (Last done on 06/30/16)   Palliative PRN treatment(s):  PalliativeT12, L1, L2, &L3Medial Branch Level(s)Block PalliativeT12, L1, L2, &L3Medial Branch Level(s)radiofrequency abla      Provider-requested follow-up: Return in about 3 months (around 09/19/2017) for MedMgmt with Me Donella Stade [facility]).  Future Appointments  Date Time Provider [place]  09/19/2017  8:45 AM Vevelyn Francois, NP [facility] None   Primary Care Physician: [facility], [place] Location: [facility] Outpatient Pain Management Facility Note by: Vevelyn Francois NP Date: 06/21/2017; Time: 11:11 AM  Pain Score Disclaimer: We  use the NRS-11 scale. This is a self-reported, subjective measurement of pain severity with only modest accuracy. It is used primarily to identify changes within a particular patient. It must be understood that outpatient pain scales are significantly less accurate that those used for research, where they can be applied under ideal controlled circumstances with minimal exposure to variables. In reality, the score is likely to be a combination of pain intensity and pain affect, where pain affect describes the degree of emotional arousal or changes in action readiness caused by the sensory experience of pain. Factors such as social and work situation, setting, emotional state, anxiety levels, expectation, and prior pain experience may influence pain perception and show large inter-individual differences that may also be  affected by time variables.  Patient instructions provided during this appointment: Patient Instructions   ____________________________________________________________________________________________  Medication Rules  Applies to: All patients receiving prescriptions (written or electronic).  Pharmacy of record: Pharmacy where electronic prescriptions will be sent. If written prescriptions are taken to a different pharmacy, please inform the nursing staff. The pharmacy listed in the electronic medical record should be the one where you would like electronic prescriptions to be sent.  Prescription refills: Only during scheduled appointments. Applies to both, written and electronic prescriptions.  NOTE: The following applies primarily to controlled substances (Opioid* Pain Medications).   Patient's responsibilities: 1. Pain Pills: Bring all pain pills to every appointment (except for procedure appointments). 2. Pill Bottles: Bring pills in original pharmacy bottle. Always bring newest bottle. Bring bottle, even if empty. 3. Medication refills: You are responsible for knowing and  keeping track of what medications you need refilled. The day before your appointment, write a list of all prescriptions that need to be refilled. Bring that list to your appointment and give it to the admitting nurse. Prescriptions will be written only during appointments. If you forget a medication, it will not be "Called in", "Faxed", or "electronically sent". You will need to get another appointment to get these prescribed. 4. Prescription Accuracy: You are responsible for carefully inspecting your prescriptions before leaving our office. Have the discharge nurse carefully go over each prescription with you, before taking them home. Make sure that your name is accurately spelled, that your address is correct. Check the name and dose of your medication to make sure it is accurate. Check the number of pills, and the written instructions to make sure they are clear and accurate. Make sure that you are given enough medication to last until your next medication refill appointment. 5. Taking Medication: Take medication as prescribed. Never take more pills than instructed. Never take medication more frequently than prescribed. Taking less pills or less frequently is permitted and encouraged, when it comes to controlled substances (written prescriptions).  6. Inform other Doctors: Always inform, all of your healthcare providers, of all the medications you take. 7. Pain Medication from other Providers: You are not allowed to accept any additional pain medication from any other Doctor or Healthcare provider. There are two exceptions to this rule. (see below) In the event that you require additional pain medication, you are responsible for notifying us, as stated below. 8. Medication Agreement: You are responsible for carefully reading and following our Medication Agreement. This must be signed before receiving any prescriptions from our practice. Safely store a copy of your signed Agreement. Violations to the  Agreement will result in no further prescriptions. (Additional copies of our Medication Agreement are available upon request.) 9. Laws, Rules, & Regulations: All patients are expected to follow all Federal and [employer], [employer], Rules, [employer]. Ignorance of the Laws does not constitute a valid excuse. The use of any illegal substances is prohibited. 10. Adopted CDC guidelines & recommendations: Target dosing levels will be at or below 60 MME/day. Use of benzodiazepines** is not recommended.  Exceptions: There are only two exceptions to the rule of not receiving pain medications from other Healthcare Providers. 1. Exception #1 (Emergencies): In the event of an emergency (i.e.: accident requiring emergency care), you are allowed to receive additional pain medication. However, you are responsible for: As soon as you are able, call our office [phone], at any time of the day or night, and leave a message stating your name,  the date and nature of the emergency, and the name and dose of the medication prescribed. In the event that your call is answered by a member of our staff, make sure to document and save the date, time, and the name of the person that took your information.  2. Exception #2 (Planned Surgery): In the event that you are scheduled by another doctor or dentist to have any type of surgery or procedure, you are allowed (for a period no longer than 30 days), to receive additional pain medication, for the acute post-op pain. However, in this case, you are responsible for picking up a copy of our "Post-op Pain Management for Surgeons" handout, and giving it to your surgeon or dentist. This document is available at our office, and does not require an appointment to obtain it. Simply go to our office during business hours (Monday-Thursday from 8:00 AM to 4:00 PM) (Friday 8:00 AM to 12:00 Noon) or if you have a scheduled appointment with [place], prior to your surgery, and ask for it by name. In  addition, you will need to provide [place] with your name, name of your surgeon, type of surgery, and date of procedure or surgery.  *Opioid medications include: morphine, codeine, oxycodone, oxymorphone, hydrocodone, hydromorphone, meperidine, tramadol, tapentadol, buprenorphine, fentanyl, methadone. **Benzodiazepine medications include: diazepam (Valium), alprazolam (Xanax), clonazepam (Klonopine), lorazepam (Ativan), clorazepate (Tranxene), chlordiazepoxide (Librium), estazolam (Prosom), oxazepam (Serax), temazepam (Restoril), triazolam (Halcion)  ____________________________________________________________________________________________

## 2017-06-21 NOTE — Patient Instructions (Signed)

## 2017-09-13 ENCOUNTER — Encounter: Payer: Self-pay | Admitting: Nurse Practitioner

## 2017-09-13 ENCOUNTER — Ambulatory Visit: Payer: BLUE CROSS/BLUE SHIELD | Attending: Nurse Practitioner | Admitting: Nurse Practitioner

## 2017-09-13 ENCOUNTER — Other Ambulatory Visit: Payer: Self-pay

## 2017-09-13 VITALS — BP 118/67 | HR 94 | Temp 98.4°F | Resp 16 | Ht 61.0 in | Wt 105.0 lb

## 2017-09-13 DIAGNOSIS — G894 Chronic pain syndrome: Secondary | ICD-10-CM | POA: Diagnosis not present

## 2017-09-13 DIAGNOSIS — Z79891 Long term (current) use of opiate analgesic: Secondary | ICD-10-CM | POA: Diagnosis not present

## 2017-09-13 DIAGNOSIS — F17210 Nicotine dependence, cigarettes, uncomplicated: Secondary | ICD-10-CM | POA: Diagnosis not present

## 2017-09-13 DIAGNOSIS — Z8249 Family history of ischemic heart disease and other diseases of the circulatory system: Secondary | ICD-10-CM | POA: Diagnosis not present

## 2017-09-13 DIAGNOSIS — M150 Primary generalized (osteo)arthritis: Secondary | ICD-10-CM

## 2017-09-13 DIAGNOSIS — M47816 Spondylosis without myelopathy or radiculopathy, lumbar region: Secondary | ICD-10-CM

## 2017-09-13 DIAGNOSIS — I10 Essential (primary) hypertension: Secondary | ICD-10-CM | POA: Diagnosis not present

## 2017-09-13 DIAGNOSIS — Z5181 Encounter for therapeutic drug level monitoring: Secondary | ICD-10-CM | POA: Diagnosis not present

## 2017-09-13 DIAGNOSIS — M549 Dorsalgia, unspecified: Secondary | ICD-10-CM | POA: Diagnosis present

## 2017-09-13 DIAGNOSIS — M47815 Spondylosis without myelopathy or radiculopathy, thoracolumbar region: Secondary | ICD-10-CM | POA: Insufficient documentation

## 2017-09-13 DIAGNOSIS — Z79899 Other long term (current) drug therapy: Secondary | ICD-10-CM | POA: Diagnosis not present

## 2017-09-13 DIAGNOSIS — Z885 Allergy status to narcotic agent status: Secondary | ICD-10-CM | POA: Insufficient documentation

## 2017-09-13 DIAGNOSIS — M47814 Spondylosis without myelopathy or radiculopathy, thoracic region: Secondary | ICD-10-CM | POA: Diagnosis not present

## 2017-09-13 DIAGNOSIS — M1288 Other specific arthropathies, not elsewhere classified, other specified site: Secondary | ICD-10-CM | POA: Insufficient documentation

## 2017-09-13 DIAGNOSIS — M792 Neuralgia and neuritis, unspecified: Secondary | ICD-10-CM

## 2017-09-13 DIAGNOSIS — Z791 Long term (current) use of non-steroidal anti-inflammatories (NSAID): Secondary | ICD-10-CM | POA: Diagnosis not present

## 2017-09-13 DIAGNOSIS — M7918 Myalgia, other site: Secondary | ICD-10-CM | POA: Diagnosis not present

## 2017-09-13 DIAGNOSIS — M159 Polyosteoarthritis, unspecified: Secondary | ICD-10-CM

## 2017-09-13 DIAGNOSIS — M546 Pain in thoracic spine: Secondary | ICD-10-CM | POA: Insufficient documentation

## 2017-09-13 MED ORDER — CYCLOBENZAPRINE HCL 10 MG PO TABS: 10.0000 mg | ORAL_TABLET | Freq: Three times a day (TID) | ORAL | 2 refills | Status: DC | PRN

## 2017-09-13 MED ORDER — GABAPENTIN 100 MG PO CAPS: 100.0000 mg | ORAL_CAPSULE | Freq: Every day | ORAL | 2 refills | Status: DC

## 2017-09-13 MED ORDER — MELOXICAM 15 MG PO TABS: 15.0000 mg | ORAL_TABLET | Freq: Every day | ORAL | 2 refills | Status: AC

## 2017-09-13 MED ORDER — KETOROLAC TROMETHAMINE 60 MG/2ML IM SOLN
60.0000 mg | Freq: Once | INTRAMUSCULAR | Status: AC
  Administered 2017-09-13: 60 mg via INTRAMUSCULAR
  Filled 2017-09-13: qty 2

## 2017-09-13 MED ORDER — ORPHENADRINE CITRATE 30 MG/ML IJ SOLN
60.0000 mg | Freq: Once | INTRAMUSCULAR | Status: AC
  Administered 2017-09-13: 60 mg via INTRAMUSCULAR
  Filled 2017-09-13: qty 2

## 2017-09-13 NOTE — Progress Notes (Signed)
Patient's Name: Deanna Jacobson  MRN: [id number]  Referring Provider: [facility]  DOB: 1956-02-18  PCP: [facility], [place]  DOS: 09/13/2017  Note by: Vevelyn Francois NP  Service setting: Ambulatory outpatient  Specialty: Interventional Pain Management  Location: [facility] (AMB) Pain Management Facility    Patient type: Established    Primary Reason(s) for Visit: Encounter for prescription drug management. (Level of risk: moderate)  CC: Back Pain  HPI  Deanna Jacobson is a 62 y.o. year old, adult patient, who comes today for a medication management evaluation. She has Acute renal failure (ARF) ([place]); Long term current use of opiate analgesic; Long term prescription opiate use; Opiate use; Encounter for therapeutic drug level monitoring; Encounter for pain management planning; Chronic mid back pain; Chronic thoracic spine pain (midline); Thoracic facet syndrome (Bilateral); Lumbar facet arthropathy ([place]); Musculoskeletal pain; Neurogenic pain; Osteoarthritis; Chronic pain syndrome; Thoracic spondylosis; Thoracolumbar spondylosis; Marijuana use; and Lumbar facet joint syndrome (Bilateral) on their problem list. Her primarily concern today is the Back Pain  Pain Assessment: Location: Lower, Mid Back Radiating: no radiating pain  Onset: More than a month ago Duration: Chronic pain Quality: Shooting, Penetrating Severity: 4 /10 (self-reported pain score)  Note: Reported level is compatible with observation.                          Timing: Constant Modifying factors: Ice and medications   Deanna Jacobson was last scheduled for an appointment on 06/21/2017 for medication management. During today's appointment we reviewed Deanna Jacobson's chronic pain status, as well as her outpatient medication regimen. She states that her back pain has returned. She admits that it just stays in the back. She does continue to work but will be off this summer. She admits that this will help  her pain. She admits that with the increased pain she has problems completing her ADL's. She is not eating as well.      The patient  reports that she has current or past drug history. Drug: Marijuana. Her body mass index is 19.84 kg/m.  Further details on both, my assessment(s), as well as the proposed treatment plan, please see below.  Controlled Substance Pharmacotherapy Assessment REMS (Risk Evaluation and Mitigation Strategy)  Analgesic: None MME/day: 0 mg/day.    No notes on file Pharmacokinetics: Liberation and absorption (onset of action): WNL Distribution (time to peak effect): WNL Metabolism and excretion (duration of action): WNL         Pharmacodynamics: Desired effects: Analgesia: Deanna Jacobson reports >50% benefit. Functional ability: Patient reports that medication allows her to accomplish basic ADLs Clinically meaningful improvement in function (CMIF): Sustained CMIF goals met Perceived effectiveness: Described as relatively effective, allowing for increase in activities of daily living (ADL) Undesirable effects: Side-effects or Adverse reactions: None reported Monitoring: [place] PMP: Online review of the past 88-monthperiod conducted. Compliant with practice rules and regulations Last UDS on record: Summary  Date Value Ref Range Status  02/02/2017 FINAL  Final    Comment:    ==================================================================== TOXASSURE SELECT 13 (MW) ==================================================================== Test                             Result       Flag       Units Drug Present and Declared for Prescription Verification   Tramadol                       >  4000        EXPECTED   ng/mg creat   O-Desmethyltramadol            >4000        EXPECTED   ng/mg creat   N-Desmethyltramadol            421          EXPECTED   ng/mg creat    Source of tramadol is a prescription medication.    O-desmethyltramadol and N-desmethyltramadol are  expected    metabolites of tramadol. Drug Present not Declared for Prescription Verification   Carboxy-THC                    33           UNEXPECTED ng/mg creat    Carboxy-THC is a metabolite of tetrahydrocannabinol  (THC).    Source of [facility] is most commonly illicit, but THC is also present    in a scheduled prescription medication. ==================================================================== Test                      Result    Flag   Units      Ref Range   Creatinine              125              mg/dL      >=20 ==================================================================== Declared Medications:  The flagging and interpretation on this report are based on the  following declared medications.  Unexpected results may arise from  inaccuracies in the declared medications.  **Note: The testing scope of this panel includes these medications:  Tramadol  **Note: The testing scope of this panel does not include following  reported medications:  Cyclobenzaprine  Gabapentin  Lisinopril  Meloxicam  Trazodone ==================================================================== For clinical consultation, please call [phone]. ====================================================================    UDS interpretation: Non-Compliant          Medication Assessment Form: Reviewed. Patient indicates being compliant with therapy Treatment compliance: Non-compliant Risk Assessment Profile: Aberrant behavior: See prior evaluations. None observed or detected today Comorbid factors increasing risk of overdose: See prior notes. No additional risks detected today Risk of substance use disorder (SUD): Very High Opioid Risk Tool - 09/13/17 0939      Family History of Substance Abuse   Alcohol  Negative    Illegal Drugs  Negative    Rx Drugs  Negative      Personal History of Substance Abuse   Alcohol  Negative    Illegal Drugs  Positive Female or Female previous marajuana use;  denies current use   previous marajuana use; denies current use   Rx Drugs  Negative      Age   Age between 77-45 years   No      History of Preadolescent Sexual Abuse   History of Preadolescent Sexual Abuse  Negative or Female      Psychological Disease   Psychological Disease  Negative    Depression  Negative      Total Score   Opioid Risk Tool Scoring  4    Opioid Risk Interpretation  Moderate Risk      ORT Scoring interpretation table:  Score <3 = Low Risk for SUD  Score between 4-7 = Moderate Risk for SUD  Score >8 = High Risk for Opioid Abuse   Risk Mitigation Strategies:  Patient Counseling: Covered Patient-Prescriber Agreement (PPA): Present and active  Notification to  other healthcare providers: Done  Pharmacologic Plan: Deanna Jacobson is not a candidate for opioid therapy at this time.             Laboratory Chemistry  Inflammation Markers (CRP: Acute Phase) (ESR: Chronic Phase) Lab Results  Component Value Date   CRP 0.4 12/23/2016   ESRSEDRATE 7 12/23/2016                         Rheumatology Markers No results found for: Elayne Guerin, [facility]                      Renal Function Markers Lab Results  Component Value Date   BUN 9 02/02/2017   CREATININE 0.69 02/02/2017   GFRAA >60 02/02/2017   GFRNONAA >60 02/02/2017                              Hepatic Function Markers Lab Results  Component Value Date   AST 15 02/02/2017   ALT 12 (L) 02/02/2017   ALBUMIN 3.2 (L) 02/02/2017   ALKPHOS 75 02/02/2017                        Electrolytes Lab Results  Component Value Date   NA 138 02/02/2017   K 3.9 02/02/2017   CL 104 02/02/2017   CALCIUM 9.0 02/02/2017   MG 1.9 12/23/2016                        Neuropathy Markers Lab Results  Component Value Date   VITAMINB12 734 12/23/2016                        Bone Pathology Markers Lab Results  Component Value Date   25OHVITD1 49 12/23/2016   25OHVITD2 3.2  12/23/2016   25OHVITD3 46 12/23/2016                         Coagulation Parameters Lab Results  Component Value Date   PLT 325 12/23/2016                        Cardiovascular Markers Lab Results  Component Value Date   TROPONINI <0.03 02/01/2016   HGB 12.8 12/23/2016   HCT 39.9 12/23/2016                         CA Markers No results found for: CEA, CA125, LABCA2                      Note: Lab results reviewed.  Recent Diagnostic Imaging Results  DG C-Arm 1-60 Min-No Report Fluoroscopy was utilized by the requesting physician.  No radiographic  interpretation.   Complexity Note: Imaging results reviewed. Results shared with Deanna Jacobson, using Layman's terms.                         Meds   Current Outpatient Medications:  .  amLODipine (NORVASC) 2.5 MG tablet, Take 2.5 mg by mouth., Disp: , Rfl:  .  Aspirin-Acetaminophen (GOODYS BODY PAIN PO), Take by mouth 2 (two) times daily as needed., Disp: , Rfl:  .  cyclobenzaprine (FLEXERIL) 10 MG tablet, Take 1 tablet (10  mg total) by mouth every 8 (eight) hours as needed for muscle spasms., Disp: 90 tablet, Rfl: 2 .  gabapentin (NEURONTIN) 100 MG capsule, Take 1-3 capsules (100-300 mg total) by mouth at bedtime., Disp: 90 capsule, Rfl: 2 .  hydrOXYzine (ATARAX/VISTARIL) 10 MG tablet, Take 10 mg by mouth., Disp: , Rfl:  .  lisinopril (PRINIVIL,ZESTRIL) 20 MG tablet, Take 1 tablet (20 mg total) by mouth daily., Disp: 30 tablet, Rfl: 2 .  meloxicam (MOBIC) 15 MG tablet, Take 1 tablet (15 mg total) by mouth daily., Disp: 30 tablet, Rfl: 2 .  traZODone (DESYREL) 100 MG tablet, Take 100 mg by mouth at bedtime. , Disp: , Rfl: 4  ROS  Constitutional: Denies any fever or chills Gastrointestinal: No reported hemesis, hematochezia, vomiting, or acute GI distress Musculoskeletal: Denies any acute onset joint swelling, redness, loss of ROM, or weakness Neurological: No reported episodes of acute onset apraxia, aphasia, dysarthria,  agnosia, amnesia, paralysis, loss of coordination, or loss of consciousness  Allergies  Deanna Jacobson is allergic to codeine.  [place]  Drug: Deanna Jacobson  reports that she has current or past drug history. Drug: Marijuana. Alcohol:  reports that she drinks alcohol. Tobacco:  reports that she has been smoking cigarettes.  She has been smoking about 1.00 pack per day. She has never used smokeless tobacco. Medical:  has a past medical history of Chronic back pain and Hypertension. Surgical: Deanna Jacobson  has a past surgical history that includes Breast enhancement surgery. Family: family history includes Cancer in her mother; Hypertension in her father.  Constitutional Exam  General appearance: Well nourished, well developed, and well hydrated. In no apparent acute distress Vitals:   09/13/17 0934  BP: 118/67  Pulse: 94  Resp: 16  Temp: 98.4 F (36.9 C)  SpO2: 97%  Weight: 105 lb (47.6 kg)  Height: '5\' 1"'  (1.549 m)  Psych/Mental status: Alert, oriented x 3 (person, place, & time)       Eyes: PERLA Respiratory: No evidence of acute respiratory distress   Lumbar Spine Area Exam  Skin & Axial Inspection: No masses, redness, or swelling Alignment: Symmetrical Functional ROM: Unrestricted ROM      Stability: No instability detected Muscle Tone/Strength: Functionally intact. No obvious neuro-muscular anomalies detected. Sensory (Neurological): Unimpaired Palpation: Complains of area being tender to palpation       Provocative Tests: Lumbar Hyperextension and rotation test: evaluation deferred today       Lumbar Lateral bending test: evaluation deferred today       Patrick's Maneuver: evaluation deferred today                    Gait & Posture Assessment  Ambulation: Unassisted Gait: Relatively normal for age and body habitus Posture: WNL   Lower Extremity Exam    Side: Right lower extremity  Side: Left lower extremity  Skin & Extremity Inspection: Skin color, temperature,  and hair growth are WNL. No peripheral edema or cyanosis. No masses, redness, swelling, asymmetry, or associated skin lesions. No contractures.  Skin & Extremity Inspection: Skin color, temperature, and hair growth are WNL. No peripheral edema or cyanosis. No masses, redness, swelling, asymmetry, or associated skin lesions. No contractures.  Functional ROM: Unrestricted ROM          Functional ROM: Unrestricted ROM          Muscle Tone/Strength: Functionally intact. No obvious neuro-muscular anomalies detected.  Muscle Tone/Strength: Functionally intact. No obvious neuro-muscular anomalies detected.  Sensory (Neurological): Unimpaired  Sensory (Neurological): Unimpaired  Palpation: No palpable anomalies  Palpation: No palpable anomalies   Assessment  Primary Diagnosis & Pertinent Problem List: The primary encounter diagnosis was Lumbar facet arthropathy ([place]). Diagnoses of Lumbar facet joint syndrome (Bilateral), Thoracic spondylosis, Primary osteoarthritis involving multiple joints, Neurogenic pain, Musculoskeletal pain, and Chronic pain syndrome were also pertinent to this visit.  Status Diagnosis  Worsening Worsening Controlled 1. Lumbar facet arthropathy ([place])   2. Lumbar facet joint syndrome (Bilateral)   3. Thoracic spondylosis   4. Primary osteoarthritis involving multiple joints   5. Neurogenic pain   6. Musculoskeletal pain   7. Chronic pain syndrome     Problems updated and reviewed during this visit: No problems updated. Plan of Care  Pharmacotherapy (Medications Ordered): Meds ordered this encounter  Medications  . cyclobenzaprine (FLEXERIL) 10 MG tablet    Sig: Take 1 tablet (10 mg total) by mouth every 8 (eight) hours as needed for muscle spasms.    Dispense:  90 tablet    Refill:  2    Do not add this medication to the electronic "Automatic Refill" notification system. Patient may have prescription filled one day early if pharmacy is closed on scheduled refill date.     Order Specific Question:   Supervising Provider    Answer:   Milinda Pointer [phone]  . gabapentin (NEURONTIN) 100 MG capsule    Sig: Take 1-3 capsules (100-300 mg total) by mouth at bedtime.    Dispense:  90 capsule    Refill:  2    Do not place this medication, or any other prescription from our practice, on "Automatic Refill". Patient may have prescription filled one day early if pharmacy is closed on scheduled refill date.    Order Specific Question:   Supervising Provider    Answer:   Milinda Pointer [phone]  . meloxicam (MOBIC) 15 MG tablet    Sig: Take 1 tablet (15 mg total) by mouth daily.    Dispense:  30 tablet    Refill:  2    Do not add this medication to the electronic "Automatic Refill" notification system. Patient may have prescription filled one day early if pharmacy is closed on scheduled refill date.    Order Specific Question:   Supervising Provider    Answer:   Milinda Pointer [phone]  . orphenadrine (NORFLEX) injection 60 mg  . ketorolac (TORADOL) injection 60 mg   New Prescriptions   No medications on file   Medications administered today: We administered orphenadrine and ketorolac. Lab-work, procedure(s), and/or referral(s): No orders of the defined types were placed in this encounter.  Imaging and/or referral(s): None  Interventional therapies: Planned, scheduled, and/or pending:  Not at this time.   Considering:  Palliative LeftT12, L1, L2, &L3Medial Branch Level(s)RFA (Last done on 06/30/16)   Palliative PRN treatment(s):  PalliativeT12, L1, L2, &L3Medial Branch Level(s)Block PalliativeT12, L1, L2, &L3Medial Branch Level(s)RFA     Provider-requested follow-up: Return in about 3 months (around 12/13/2017) for MedMgmt with Me Donella Stade [facility]).  Future Appointments  Date Time Provider [place]  12/13/2017  8:45 AM Vevelyn Francois, NP [facility] None   Primary Care Physician: [facility], [place] Location: [facility] Outpatient Pain Management Facility Note by: Vevelyn Francois NP Date: 09/13/2017; Time: 1:06 PM  Pain Score Disclaimer: We use the NRS-11 scale. This is a self-reported, subjective measurement of pain severity with only modest accuracy. It is used primarily to identify changes within a particular patient. It must be  understood that outpatient pain scales are significantly less accurate that those used for research, where they can be applied under ideal controlled circumstances with minimal exposure to variables. In reality, the score is likely to be a combination of pain intensity and pain affect, where pain affect describes the degree of emotional arousal or changes in action readiness caused by the sensory experience of pain. Factors such as social and work situation, setting, emotional state, anxiety levels, expectation, and prior pain experience may influence pain perception and show large inter-individual differences that may also be affected by time variables.  Patient instructions provided during this appointment: Patient Instructions  _You were given 3 prescriptions sent to your pharmacy. ___________________________________________________________________________________________  Medication Rules  Applies to: All patients receiving prescriptions (written or electronic).  Pharmacy of record: Pharmacy where electronic prescriptions will be sent. If written prescriptions are taken to a different pharmacy, please inform the nursing staff. The pharmacy listed in the electronic medical record should be the one where you would like electronic prescriptions to be sent.  Prescription refills: Only during scheduled appointments. Applies to both, written and electronic prescriptions.  NOTE: The following applies primarily to controlled substances (Opioid* Pain Medications).   Patient's responsibilities: 1. Pain Pills: Bring all pain pills to every appointment (except for  procedure appointments). 2. Pill Bottles: Bring pills in original pharmacy bottle. Always bring newest bottle. Bring bottle, even if empty. 3. Medication refills: You are responsible for knowing and keeping track of what medications you need refilled. The day before your appointment, write a list of all prescriptions that need to be refilled. Bring that list to your appointment and give it to the admitting nurse. Prescriptions will be written only during appointments. If you forget a medication, it will not be "Called in", "Faxed", or "electronically sent". You will need to get another appointment to get these prescribed. 4. Prescription Accuracy: You are responsible for carefully inspecting your prescriptions before leaving our office. Have the discharge nurse carefully go over each prescription with you, before taking them home. Make sure that your name is accurately spelled, that your address is correct. Check the name and dose of your medication to make sure it is accurate. Check the number of pills, and the written instructions to make sure they are clear and accurate. Make sure that you are given enough medication to last until your next medication refill appointment. 5. Taking Medication: Take medication as prescribed. Never take more pills than instructed. Never take medication more frequently than prescribed. Taking less pills or less frequently is permitted and encouraged, when it comes to controlled substances (written prescriptions).  6. Inform other Doctors: Always inform, all of your healthcare providers, of all the medications you take. 7. Pain Medication from other Providers: You are not allowed to accept any additional pain medication from any other Doctor or Healthcare provider. There are two exceptions to this rule. (see below) In the event that you require additional pain medication, you are responsible for notifying us, as stated below. 8. Medication Agreement: You are responsible for  carefully reading and following our Medication Agreement. This must be signed before receiving any prescriptions from our practice. Safely store a copy of your signed Agreement. Violations to the Agreement will result in no further prescriptions. (Additional copies of our Medication Agreement are available upon request.) 9. Laws, Rules, & Regulations: All patients are expected to follow all Federal and [employer], Rules, [employer]. Ignorance of the Laws does not constitute a valid excuse.  The use of any illegal substances is prohibited. 10. Adopted CDC guidelines & recommendations: Target dosing levels will be at or below 60 MME/day. Use of benzodiazepines** is not recommended.  Exceptions: There are only two exceptions to the rule of not receiving pain medications from other Healthcare Providers. 1. Exception #1 (Emergencies): In the event of an emergency (i.e.: accident requiring emergency care), you are allowed to receive additional pain medication. However, you are responsible for: As soon as you are able, call our office [phone], at any time of the day or night, and leave a message stating your name, the date and nature of the emergency, and the name and dose of the medication prescribed. In the event that your call is answered by a member of our staff, make sure to document and save the date, time, and the name of the person that took your information.  2. Exception #2 (Planned Surgery): In the event that you are scheduled by another doctor or dentist to have any type of surgery or procedure, you are allowed (for a period no longer than 30 days), to receive additional pain medication, for the acute post-op pain. However, in this case, you are responsible for picking up a copy of our "Post-op Pain Management for Surgeons" handout, and giving it to your surgeon or dentist. This document is available at our office, and does not require an appointment to obtain it. Simply go to our  office during business hours (Monday-Thursday from 8:00 AM to 4:00 PM) (Friday 8:00 AM to 12:00 Noon) or if you have a scheduled appointment with [place], prior to your surgery, and ask for it by name. In addition, you will need to provide [place] with your name, name of your surgeon, type of surgery, and date of procedure or surgery.  *Opioid medications include: morphine, codeine, oxycodone, oxymorphone, hydrocodone, hydromorphone, meperidine, tramadol, tapentadol, buprenorphine, fentanyl, methadone. **Benzodiazepine medications include: diazepam (Valium), alprazolam (Xanax), clonazepam (Klonopine), lorazepam (Ativan), clorazepate (Tranxene), chlordiazepoxide (Librium), estazolam (Prosom), oxazepam (Serax), temazepam (Restoril), triazolam (Halcion) (Last updated: 07/28/2017) ____________________________________________________________________________________________

## 2017-09-13 NOTE — Patient Instructions (Addendum)
_You were given 3 prescriptions sent to your pharmacy. ___________________________________________________________________________________________  Medication Rules  Applies to: All patients receiving prescriptions (written or electronic).  Pharmacy of record: Pharmacy where electronic prescriptions will be sent. If written prescriptions are taken to a different pharmacy, please inform the nursing staff. The pharmacy listed in the electronic medical record should be the one where you would like electronic prescriptions to be sent.  Prescription refills: Only during scheduled appointments. Applies to both, written and electronic prescriptions.  NOTE: The following applies primarily to controlled substances (Opioid* Pain Medications).   Patient's responsibilities: 1. Pain Pills: Bring all pain pills to every appointment (except for procedure appointments). 2. Pill Bottles: Bring pills in original pharmacy bottle. Always bring newest bottle. Bring bottle, even if empty. 3. Medication refills: You are responsible for knowing and keeping track of what medications you need refilled. The day before your appointment, write a list of all prescriptions that need to be refilled. Bring that list to your appointment and give it to the admitting nurse. Prescriptions will be written only during appointments. If you forget a medication, it will not be "Called in", "Faxed", or "electronically sent". You will need to get another appointment to get these prescribed. 4. Prescription Accuracy: You are responsible for carefully inspecting your prescriptions before leaving our office. Have the discharge nurse carefully go over each prescription with you, before taking them home. Make sure that your name is accurately spelled, that your address is correct. Check the name and dose of your medication to make sure it is accurate. Check the number of pills, and the written instructions to make sure they are clear and accurate.  Make sure that you are given enough medication to last until your next medication refill appointment. 5. Taking Medication: Take medication as prescribed. Never take more pills than instructed. Never take medication more frequently than prescribed. Taking less pills or less frequently is permitted and encouraged, when it comes to controlled substances (written prescriptions).  6. Inform other Doctors: Always inform, all of your healthcare providers, of all the medications you take. 7. Pain Medication from other Providers: You are not allowed to accept any additional pain medication from any other Doctor or Healthcare provider. There are two exceptions to this rule. (see below) In the event that you require additional pain medication, you are responsible for notifying us, as stated below. 8. Medication Agreement: You are responsible for carefully reading and following our Medication Agreement. This must be signed before receiving any prescriptions from our practice. Safely store a copy of your signed Agreement. Violations to the Agreement will result in no further prescriptions. (Additional copies of our Medication Agreement are available upon request.) 9. Laws, Rules, & Regulations: All patients are expected to follow all Federal and [employer], Rules, [employer]. Ignorance of the Laws does not constitute a valid excuse. The use of any illegal substances is prohibited. 10. Adopted CDC guidelines & recommendations: Target dosing levels will be at or below 60 MME/day. Use of benzodiazepines** is not recommended.  Exceptions: There are only two exceptions to the rule of not receiving pain medications from other Healthcare Providers. 1. Exception #1 (Emergencies): In the event of an emergency (i.e.: accident requiring emergency care), you are allowed to receive additional pain medication. However, you are responsible for: As soon as you are able, call our office [phone], at any time of the  day or night, and leave a message stating your name, the date and nature of the  emergency, and the name and dose of the medication prescribed. In the event that your call is answered by a member of our staff, make sure to document and save the date, time, and the name of the person that took your information.  2. Exception #2 (Planned Surgery): In the event that you are scheduled by another doctor or dentist to have any type of surgery or procedure, you are allowed (for a period no longer than 30 days), to receive additional pain medication, for the acute post-op pain. However, in this case, you are responsible for picking up a copy of our "Post-op Pain Management for Surgeons" handout, and giving it to your surgeon or dentist. This document is available at our office, and does not require an appointment to obtain it. Simply go to our office during business hours (Monday-Thursday from 8:00 AM to 4:00 PM) (Friday 8:00 AM to 12:00 Noon) or if you have a scheduled appointment with [place], prior to your surgery, and ask for it by name. In addition, you will need to provide [place] with your name, name of your surgeon, type of surgery, and date of procedure or surgery.  *Opioid medications include: morphine, codeine, oxycodone, oxymorphone, hydrocodone, hydromorphone, meperidine, tramadol, tapentadol, buprenorphine, fentanyl, methadone. **Benzodiazepine medications include: diazepam (Valium), alprazolam (Xanax), clonazepam (Klonopine), lorazepam (Ativan), clorazepate (Tranxene), chlordiazepoxide (Librium), estazolam (Prosom), oxazepam (Serax), temazepam (Restoril), triazolam (Halcion) (Last updated: 07/28/2017) ____________________________________________________________________________________________

## 2017-09-15 ENCOUNTER — Encounter: Payer: BLUE CROSS/BLUE SHIELD | Admitting: Nurse Practitioner

## 2017-09-19 ENCOUNTER — Encounter: Payer: BLUE CROSS/BLUE SHIELD | Admitting: Nurse Practitioner

## 2017-12-13 ENCOUNTER — Ambulatory Visit: Payer: BLUE CROSS/BLUE SHIELD | Attending: Nurse Practitioner | Admitting: Nurse Practitioner

## 2017-12-13 ENCOUNTER — Encounter: Payer: Self-pay | Admitting: Nurse Practitioner

## 2017-12-13 ENCOUNTER — Other Ambulatory Visit: Payer: Self-pay

## 2017-12-13 VITALS — BP 147/75 | HR 93 | Temp 97.7°F | Resp 16 | Ht 61.0 in | Wt 107.0 lb

## 2017-12-13 DIAGNOSIS — I10 Essential (primary) hypertension: Secondary | ICD-10-CM | POA: Insufficient documentation

## 2017-12-13 DIAGNOSIS — M47816 Spondylosis without myelopathy or radiculopathy, lumbar region: Secondary | ICD-10-CM | POA: Diagnosis not present

## 2017-12-13 DIAGNOSIS — Z885 Allergy status to narcotic agent status: Secondary | ICD-10-CM | POA: Diagnosis not present

## 2017-12-13 DIAGNOSIS — M546 Pain in thoracic spine: Secondary | ICD-10-CM | POA: Insufficient documentation

## 2017-12-13 DIAGNOSIS — Z79899 Other long term (current) drug therapy: Secondary | ICD-10-CM | POA: Insufficient documentation

## 2017-12-13 DIAGNOSIS — M7918 Myalgia, other site: Secondary | ICD-10-CM | POA: Insufficient documentation

## 2017-12-13 DIAGNOSIS — Z7982 Long term (current) use of aspirin: Secondary | ICD-10-CM | POA: Insufficient documentation

## 2017-12-13 DIAGNOSIS — Z79891 Long term (current) use of opiate analgesic: Secondary | ICD-10-CM | POA: Diagnosis not present

## 2017-12-13 DIAGNOSIS — F1290 Cannabis use, unspecified, uncomplicated: Secondary | ICD-10-CM | POA: Insufficient documentation

## 2017-12-13 DIAGNOSIS — G894 Chronic pain syndrome: Secondary | ICD-10-CM | POA: Insufficient documentation

## 2017-12-13 DIAGNOSIS — M792 Neuralgia and neuritis, unspecified: Secondary | ICD-10-CM

## 2017-12-13 DIAGNOSIS — M47815 Spondylosis without myelopathy or radiculopathy, thoracolumbar region: Secondary | ICD-10-CM | POA: Diagnosis not present

## 2017-12-13 DIAGNOSIS — F17210 Nicotine dependence, cigarettes, uncomplicated: Secondary | ICD-10-CM | POA: Diagnosis not present

## 2017-12-13 DIAGNOSIS — M47814 Spondylosis without myelopathy or radiculopathy, thoracic region: Secondary | ICD-10-CM | POA: Diagnosis not present

## 2017-12-13 MED ORDER — KETOROLAC TROMETHAMINE 60 MG/2ML IM SOLN
INTRAMUSCULAR | Status: AC
  Filled 2017-12-13: qty 2

## 2017-12-13 MED ORDER — ORPHENADRINE CITRATE 30 MG/ML IJ SOLN
INTRAMUSCULAR | Status: AC
  Filled 2017-12-13: qty 2

## 2017-12-13 MED ORDER — CYCLOBENZAPRINE HCL 10 MG PO TABS: 10.0000 mg | ORAL_TABLET | Freq: Three times a day (TID) | ORAL | 2 refills | Status: DC | PRN

## 2017-12-13 MED ORDER — KETOROLAC TROMETHAMINE 60 MG/2ML IM SOLN
60.0000 mg | Freq: Once | INTRAMUSCULAR | Status: AC
  Administered 2017-12-13: 60 mg via INTRAMUSCULAR

## 2017-12-13 MED ORDER — ORPHENADRINE CITRATE 30 MG/ML IJ SOLN
60.0000 mg | Freq: Once | INTRAMUSCULAR | Status: AC
  Administered 2017-12-13: 60 mg via INTRAMUSCULAR

## 2017-12-13 MED ORDER — GABAPENTIN 100 MG PO CAPS: 100.0000 mg | ORAL_CAPSULE | Freq: Every day | ORAL | 2 refills | Status: DC

## 2017-12-13 NOTE — Progress Notes (Signed)
Patient's Name: Oris Calmes  MRN: [id number]  Referring Provider: [facility]  DOB: 07-02-1955  PCP: [facility], [place]  DOS: 12/13/2017  Note by: Vevelyn Francois NP  Service setting: Ambulatory outpatient  Specialty: Interventional Pain Management  Location: [facility] (AMB) Pain Management Facility    Patient type: Established    Primary Reason(s) for Visit: Encounter for prescription drug management. (Level of risk: moderate)  CC: Back Pain (mid)  HPI  Ms. Senne is a 62 y.o. year old, adult patient, who comes today for a medication management evaluation. She has Acute renal failure (ARF) ([place]); Long term current use of opiate analgesic; Long term prescription opiate use; Opiate use; Encounter for therapeutic drug level monitoring; Encounter for pain management planning; Chronic mid back pain; Chronic thoracic spine pain (midline); Thoracic facet syndrome (Bilateral); Lumbar facet arthropathy ([place]); Musculoskeletal pain; Neurogenic pain; Osteoarthritis; Chronic pain syndrome; Thoracic spondylosis; Thoracolumbar spondylosis; Marijuana use; Lumbar facet joint syndrome (Bilateral); and Lumbar spondylosis on their problem list. Her primarily concern today is the Back Pain (mid)  Pain Assessment: Location: Mid Back Radiating: denies Onset: More than a month ago Duration: Chronic pain Quality: Burning(stinging) Severity: 2 /10 (subjective, self-reported pain score)  Note: Reported level is compatible with observation.                          Effect on ADL: Does not affect daily activities Timing: Constant Modifying factors: medications BP: (!) 147/75  HR: 93  Ms. Gover was last scheduled for an appointment on 09/13/2017 for medication management. During today's appointment we reviewed Ms. Mcneill's chronic pain status, as well as her outpatient medication regimen. She is SP Bilateral Lumbar RFA. She admits that they are effective. However her pain is  midline. She would like to have have both sides treated instead of just one side. She is requesting that she will also need pain medication at the completion of the procedure because she does have increased pain for about one month.   The patient  reports that she has current or past drug history. Drug: Marijuana. Her body mass index is 20.22 kg/m.  Further details on both, my assessment(s), as well as the proposed treatment plan, please see below.  Laboratory Chemistry  Inflammation Markers (CRP: Acute Phase) (ESR: Chronic Phase) Lab Results  Component Value Date   CRP 0.4 12/23/2016   ESRSEDRATE 7 12/23/2016                         Rheumatology Markers No results found for: RF, ANA, LABURIC, URICUR, LYMEIGGIGMAB, LYMEABIGMQN, HLAB27                      Renal Function Markers Lab Results  Component Value Date   BUN 9 02/02/2017   CREATININE 0.69 02/02/2017   GFRAA >60 02/02/2017   GFRNONAA >60 02/02/2017                             Hepatic Function Markers Lab Results  Component Value Date   AST 15 02/02/2017   ALT 12 (L) 02/02/2017   ALBUMIN 3.2 (L) 02/02/2017   ALKPHOS 75 02/02/2017                        Electrolytes Lab Results  Component Value Date   NA 138 02/02/2017  K 3.9 02/02/2017   CL 104 02/02/2017   CALCIUM 9.0 02/02/2017   MG 1.9 12/23/2016                        Neuropathy Markers Lab Results  Component Value Date   VQMGQQPY19 509 12/23/2016                        Bone Pathology Markers Lab Results  Component Value Date   25OHVITD1 49 12/23/2016   25OHVITD2 3.2 12/23/2016   25OHVITD3 46 12/23/2016                         Coagulation Parameters Lab Results  Component Value Date   PLT 325 12/23/2016                        Cardiovascular Markers Lab Results  Component Value Date   TROPONINI <0.03 02/01/2016   HGB 12.8 12/23/2016   HCT 39.9 12/23/2016                         CA Markers No results found for: CEA, CA125, LABCA2                       Note: Lab results reviewed.  Recent Diagnostic Imaging Results  DG C-Arm 1-60 Min-No Report Fluoroscopy was utilized by the requesting physician.  No radiographic  interpretation.   Complexity Note: Imaging results reviewed. Results shared with Ms. Okazaki, using Layman's terms.                         Meds   Current Outpatient Medications:  .  amLODipine (NORVASC) 2.5 MG tablet, Take 2.5 mg by mouth., Disp: , Rfl:  .  Aspirin-Acetaminophen (GOODYS BODY PAIN PO), Take by mouth 2 (two) times daily as needed., Disp: , Rfl:  .  cyclobenzaprine (FLEXERIL) 10 MG tablet, Take 1 tablet (10 mg total) by mouth every 8 (eight) hours as needed for muscle spasms., Disp: 90 tablet, Rfl: 2 .  gabapentin (NEURONTIN) 100 MG capsule, Take 1-3 capsules (100-300 mg total) by mouth at bedtime., Disp: 90 capsule, Rfl: 2 .  hydrOXYzine (ATARAX/VISTARIL) 10 MG tablet, Take 10 mg by mouth., Disp: , Rfl:  .  lisinopril (PRINIVIL,ZESTRIL) 20 MG tablet, Take 1 tablet (20 mg total) by mouth daily., Disp: 30 tablet, Rfl: 2 .  traZODone (DESYREL) 100 MG tablet, Take 100 mg by mouth at bedtime. , Disp: , Rfl: 4 .  ibuprofen (ADVIL,MOTRIN) 800 MG tablet, TAKE 1 TABLET (800 MG TOTAL) BY MOUTH 3 (THREE) TIMES DAILY AS NEEDED FOR PAIN WITH FOOD, Disp: , Rfl: 1  ROS  Constitutional: Denies any fever or chills Gastrointestinal: No reported hemesis, hematochezia, vomiting, or acute GI distress Musculoskeletal: Denies any acute onset joint swelling, redness, loss of ROM, or weakness Neurological: No reported episodes of acute onset apraxia, aphasia, dysarthria, agnosia, amnesia, paralysis, loss of coordination, or loss of consciousness  Allergies  Ms. Menefee is allergic to codeine.  [place]  Drug: Ms. Dearmas  reports that she has current or past drug history. Drug: Marijuana. Alcohol:  reports that she drinks alcohol. Tobacco:  reports that she has been smoking cigarettes.  She has been  smoking about 1.00 pack per day. She has never used smokeless tobacco. Medical:  has a  past medical history of Chronic back pain and Hypertension. Surgical: Ms. Sherburne  has a past surgical history that includes Breast enhancement surgery. Family: family history includes Cancer in her mother; Hypertension in her father.  Constitutional Exam  General appearance: Well nourished, well developed, and well hydrated. In no apparent acute distress Vitals:   12/13/17 0852  BP: (!) 147/75  Pulse: 93  Resp: 16  Temp: 97.7 F (36.5 C)  SpO2: 100%  Weight: 107 lb (48.5 kg)  Height: '5\' 1"'  (1.549 m)   BMI Assessment: Estimated body mass index is 20.22 kg/m as calculated from the following:   Height as of this encounter: '5\' 1"'  (1.549 m).   Weight as of this encounter: 107 lb (48.5 kg). Psych/Mental status: Alert, oriented x 3 (person, place, & time)       Eyes: PERLA Respiratory: No evidence of acute respiratory distress   Lumbar Spine Area Exam  Skin & Axial Inspection: No masses, redness, or swelling Alignment: Symmetrical Functional ROM: Unrestricted ROM       Stability: No instability detected Muscle Tone/Strength: Functionally intact. No obvious neuro-muscular anomalies detected. Sensory (Neurological): Unimpaired Palpation: Complains of area being tender to palpation       Provocative Tests: Lumbar Hyperextension/rotation test: Positive bilaterally for facet joint pain.  Gait & Posture Assessment  Ambulation: Unassisted Gait: Relatively normal for age and body habitus Posture: WNL   Lower Extremity Exam    Side: Right lower extremity  Side: Left lower extremity  Stability: No instability observed          Stability: No instability observed          Skin & Extremity Inspection: Skin color, temperature, and hair growth are WNL. No peripheral edema or cyanosis. No masses, redness, swelling, asymmetry, or associated skin lesions. No contractures.  Skin & Extremity Inspection: Skin  color, temperature, and hair growth are WNL. No peripheral edema or cyanosis. No masses, redness, swelling, asymmetry, or associated skin lesions. No contractures.  Functional ROM: Unrestricted ROM                  Functional ROM: Unrestricted ROM                  Muscle Tone/Strength: Functionally intact. No obvious neuro-muscular anomalies detected.  Muscle Tone/Strength: Functionally intact. No obvious neuro-muscular anomalies detected.  Sensory (Neurological): Unimpaired  Sensory (Neurological): Unimpaired  Palpation: No palpable anomalies  Palpation: No palpable anomalies   Assessment  Primary Diagnosis & Pertinent Problem List: The primary encounter diagnosis was Thoracic spondylosis. Diagnoses of Lumbar spondylosis, Lumbar facet arthropathy (HCC), Lumbar facet joint syndrome (Bilateral), Musculoskeletal pain, Neurogenic pain, and Long term prescription opiate use were also pertinent to this visit.  Status Diagnosis  Worsening Worsening Controlled 1. Thoracic spondylosis   2. Lumbar spondylosis   3. Lumbar facet arthropathy ([place])   4. Lumbar facet joint syndrome (Bilateral)   5. Musculoskeletal pain   6. Neurogenic pain   7. Long term prescription opiate use     Problems updated and reviewed during this visit: Problem  Lumbar Spondylosis   Plan of Care  Pharmacotherapy (Medications Ordered): Meds ordered this encounter  Medications  . cyclobenzaprine (FLEXERIL) 10 MG tablet    Sig: Take 1 tablet (10 mg total) by mouth every 8 (eight) hours as needed for muscle spasms.    Dispense:  90 tablet    Refill:  2    Do not add this medication to the electronic "Automatic Refill" notification  system. Patient may have prescription filled one day early if pharmacy is closed on scheduled refill date.    Order Specific Question:   Supervising Provider    Answer:   Milinda Pointer [phone]  . gabapentin (NEURONTIN) 100 MG capsule    Sig: Take 1-3 capsules (100-300 mg total) by mouth  at bedtime.    Dispense:  90 capsule    Refill:  2    Do not place this medication, or any other prescription from our practice, on "Automatic Refill". Patient may have prescription filled one day early if pharmacy is closed on scheduled refill date.    Order Specific Question:   Supervising Provider    Answer:   Milinda Pointer [phone]  . orphenadrine (NORFLEX) injection 60 mg  . ketorolac (TORADOL) injection 60 mg   New Prescriptions   No medications on file   Medications administered today: We administered orphenadrine and ketorolac. Lab-work, procedure(s), and/or referral(s): Orders Placed This Encounter  Procedures  . LUMBAR FACET(MEDIAL BRANCH NERVE BLOCK) MBNB  . Comp. Metabolic Panel (12)   Imaging and/or referral(s): None  Interventional therapies: Planned, scheduled, and/or pending:  Palliative Bilateral T12, L1, L2, &L3Medial Branch Level(s)Block. She is having midline pain and would like both sides treated and then is willing to repeat the RFA   Considering:  Palliative LeftT12, L1, L2, &L3Medial Branch Level(s)RFA (Last done on 06/30/16)   Palliative PRN treatment(s):  PalliativeT12, L1, L2, &L3Medial Branch Level(s)Block PalliativeT12, L1, L2, &L3Medial Branch Level(s)RFA      Provider-requested follow-up: Return in about 3 months (around 03/15/2018) for MedMgmt with Me Dionisio David), in addition, w/ Dr. Dossie Arbour, Procedure(w/Sedation), (ASAA).  Future Appointments  Date Time Provider [place]  12/27/2017  9:00 AM Milinda Pointer, MD [facility] None  03/14/2018  9:00 AM Vevelyn Francois, NP [facility] None   Primary Care Physician: [facility], [place] Location: [facility] Management Facility Note by: Vevelyn Francois NP Date: 12/13/2017; Time: 9:37 AM  Pain Score Disclaimer: We use the NRS-11 scale. This is a self-reported, subjective measurement of pain severity with only modest accuracy. It is  used primarily to identify changes within a particular patient. It must be understood that outpatient pain scales are significantly less accurate that those used for research, where they can be applied under ideal controlled circumstances with minimal exposure to variables. In reality, the score is likely to be a combination of pain intensity and pain affect, where pain affect describes the degree of emotional arousal or changes in action readiness caused by the sensory experience of pain. Factors such as social and work situation, setting, emotional state, anxiety levels, expectation, and prior pain experience may influence pain perception and show large inter-individual differences that may also be affected by time variables.  Patient instructions provided during this appointment: Patient Instructions   Pain Management Discharge Instructions  General Discharge Instructions :  If you need to reach your doctor call: Monday-Friday 8:00 am - 4:00 pm at [phone] or toll free [phone].  After clinic hours [phone] to have operator reach doctor.  Bring all of your medication bottles to all your appointments in the pain clinic.  To cancel or reschedule your appointment with Pain Management please remember to call 24 hours in advance to avoid a fee.  Refer to the educational materials which you have been given on: General Risks, I had my Procedure. Discharge Instructions, Post Sedation.  Post Procedure Instructions:  The drugs you were given will stay in your  system until tomorrow, so for the next 24 hours you should not drive, make any legal decisions or drink any alcoholic beverages.  You may eat anything you prefer, but it is better to start with liquids then soups and crackers, and gradually work up to solid foods.  Please notify your doctor immediately if you have any unusual bleeding, trouble breathing or pain that is not related to your normal pain.  Depending on the type of  procedure that was done, some parts of your body may feel week and/or numb.  This usually clears up by tonight or the next day.  Walk with the use of an assistive device or accompanied by an adult for the 24 hours.  You may use ice on the affected area for the first 24 hours.  Put ice in a Ziploc bag and cover with a towel and place against area 15 minutes on 15 minutes off.  You may switch to heat after 24 hours.GENERAL RISKS AND COMPLICATIONS  What are the risk, side effects and possible complications? Generally speaking, most procedures are safe.  However, with any procedure there are risks, side effects, and the possibility of complications.  The risks and complications are dependent upon the sites that are lesioned, or the type of nerve block to be performed.  The closer the procedure is to the spine, the more serious the risks are.  Great care is taken when placing the radio frequency needles, block needles or lesioning probes, but sometimes complications can occur. 1. Infection: Any time there is an injection through the skin, there is a risk of infection.  This is why sterile conditions are used for these blocks.  There are four possible types of infection. 1. Localized skin infection. 2. Central Nervous System Infection-This can be in the form of Meningitis, which can be deadly. 3. Epidural Infections-This can be in the form of an epidural abscess, which can cause pressure inside of the spine, causing compression of the spinal cord with subsequent paralysis. This would require an emergency surgery to decompress, and there are no guarantees that the patient would recover from the paralysis. 4. Discitis-This is an infection of the intervertebral discs.  It occurs in about 1% of discography procedures.  It is difficult to treat and it may lead to surgery.        2. Pain: the needles have to go through skin and soft tissues, will cause soreness.       3. Damage to internal structures:  The nerves  to be lesioned may be near blood vessels or    other nerves which can be potentially damaged.       4. Bleeding: Bleeding is more common if the patient is taking blood thinners such as  aspirin, Coumadin, Ticiid, Plavix, etc., or if he/she have some genetic predisposition  such as hemophilia. Bleeding into the spinal canal can cause compression of the spinal  cord with subsequent paralysis.  This would require an emergency surgery to  decompress and there are no guarantees that the patient would recover from the  paralysis.       5. Pneumothorax:  Puncturing of a lung is a possibility, every time a needle is introduced in  the area of the chest or upper back.  Pneumothorax refers to free air around the  collapsed lung(s), inside of the thoracic cavity (chest cavity).  Another two possible  complications related to a similar event would include: Hemothorax and Chylothorax.   These are variations  of the Pneumothorax, where instead of air around the collapsed  lung(s), you may have blood or chyle, respectively.       6. Spinal headaches: They may occur with any procedures in the area of the spine.       7. Persistent CSF (Cerebro-Spinal Fluid) leakage: This is a rare problem, but may occur  with prolonged intrathecal or epidural catheters either due to the formation of a fistulous  track or a dural tear.       8. Nerve damage: By working so close to the spinal cord, there is always a possibility of  nerve damage, which could be as serious as a permanent spinal cord injury with  paralysis.       9. Death:  Although rare, severe deadly allergic reactions known as "Anaphylactic  reaction" can occur to any of the medications used.      10. Worsening of the symptoms:  We can always make thing worse.  What are the chances of something like this happening? Chances of any of this occuring are extremely low.  By statistics, you have more of a chance of getting killed in a motor vehicle accident: while driving to the  hospital than any of the above occurring .  Nevertheless, you should be aware that they are possibilities.  In general, it is similar to taking a shower.  Everybody knows that you can slip, hit your head and get killed.  Does that mean that you should not shower again?  Nevertheless always keep in mind that statistics do not mean anything if you happen to be on the wrong side of them.  Even if a procedure has a 1 (one) in a 1,000,000 (million) chance of going wrong, it you happen to be that one..Also, keep in mind that by statistics, you have more of a chance of having something go wrong when taking medications.  Who should not have this procedure? If you are on a blood thinning medication (e.g. Coumadin, Plavix, see list of "Blood Thinners"), or if you have an active infection going on, you should not have the procedure.  If you are taking any blood thinners, please inform your physician.  How should I prepare for this procedure?  Do not eat or drink anything at least six hours prior to the procedure.  Bring a driver with you .  It cannot be a taxi.  Come accompanied by an adult that can drive you back, and that is strong enough to help you if your legs get weak or numb from the local anesthetic.  Take all of your medicines the morning of the procedure with just enough water to swallow them.  If you have diabetes, make sure that you are scheduled to have your procedure done first thing in the morning, whenever possible.  If you have diabetes, take only half of your insulin dose and notify our nurse that you have done so as soon as you arrive at the clinic.  If you are diabetic, but only take blood sugar pills (oral hypoglycemic), then do not take them on the morning of your procedure.  You may take them after you have had the procedure.  Do not take aspirin or any aspirin-containing medications, at least eleven (11) days prior to the procedure.  They may prolong bleeding.  Wear loose fitting  clothing that may be easy to take off and that you would not mind if it got stained with Betadine or blood.  Do not wear  any jewelry or perfume  Remove any nail coloring.  It will interfere with some of our monitoring equipment.  NOTE: Remember that this is not meant to be interpreted as a complete list of all possible complications.  Unforeseen problems may occur.  BLOOD THINNERS The following drugs contain aspirin or other products, which can cause increased bleeding during surgery and should not be taken for 2 weeks prior to and 1 week after surgery.  If you should need take something for relief of minor pain, you may take acetaminophen which is found in Tylenol,m Datril, Anacin-3 and Panadol. It is not blood thinner. The products listed below are.  Do not take any of the products listed below in addition to any listed on your instruction sheet.  A.P.C or A.P.C with Codeine Codeine Phosphate Capsules #3 Ibuprofen Ridaura  ABC compound Congesprin Imuran rimadil  Advil Cope Indocin Robaxisal  Alka-Seltzer Effervescent Pain Reliever and Antacid Coricidin or Coricidin-D  Indomethacin Rufen  Alka-Seltzer plus Cold Medicine Cosprin Ketoprofen S-A-C Tablets  Anacin Analgesic Tablets or Capsules Coumadin Korlgesic Salflex  Anacin Extra Strength Analgesic tablets or capsules CP-2 Tablets Lanoril Salicylate  Anaprox Cuprimine Capsules Levenox Salocol  Anexsia-D Dalteparin Magan Salsalate  Anodynos Darvon compound Magnesium Salicylate Sine-off  Ansaid Dasin Capsules Magsal Sodium Salicylate  Anturane Depen Capsules Marnal Soma  APF Arthritis pain formula Dewitt's Pills Measurin Stanback  Argesic Dia-Gesic Meclofenamic Sulfinpyrazone  Arthritis Bayer Timed Release Aspirin Diclofenac Meclomen Sulindac  Arthritis pain formula Anacin Dicumarol Medipren Supac  Analgesic (Safety coated) Arthralgen Diffunasal Mefanamic Suprofen  Arthritis Strength Bufferin Dihydrocodeine Mepro Compound Suprol   Arthropan liquid Dopirydamole Methcarbomol with Aspirin Synalgos  ASA tablets/Enseals Disalcid Micrainin Tagament  Ascriptin Doan's Midol Talwin  Ascriptin A/D Dolene Mobidin Tanderil  Ascriptin Extra Strength Dolobid Moblgesic Ticlid  Ascriptin with Codeine Doloprin or Doloprin with Codeine Momentum Tolectin  Asperbuf Duoprin Mono-gesic Trendar  Aspergum Duradyne Motrin or Motrin IB Triminicin  Aspirin plain, buffered or enteric coated Durasal Myochrisine Trigesic  Aspirin Suppositories Easprin Nalfon Trillsate  Aspirin with Codeine Ecotrin Regular or Extra Strength Naprosyn Uracel  Atromid-S Efficin Naproxen Ursinus  Auranofin Capsules Elmiron Neocylate Vanquish  Axotal Emagrin Norgesic Verin  Azathioprine Empirin or Empirin with Codeine Normiflo Vitamin E  Azolid Emprazil Nuprin Voltaren  Bayer Aspirin plain, buffered or children's or timed BC Tablets or powders Encaprin Orgaran Warfarin Sodium  Buff-a-Comp Enoxaparin Orudis Zorpin  Buff-a-Comp with Codeine Equegesic Os-Cal-Gesic   Buffaprin Excedrin plain, buffered or Extra Strength Oxalid   Bufferin Arthritis Strength Feldene Oxphenbutazone   Bufferin plain or Extra Strength Feldene Capsules Oxycodone with Aspirin   Bufferin with Codeine Fenoprofen Fenoprofen Pabalate or Pabalate-SF   Buffets II Flogesic Panagesic   Buffinol plain or Extra Strength Florinal or Florinal with Codeine Panwarfarin   Buf-Tabs Flurbiprofen Penicillamine   Butalbital Compound Four-way cold tablets Penicillin   Butazolidin Fragmin Pepto-Bismol   Carbenicillin Geminisyn Percodan   Carna Arthritis Reliever Geopen Persantine   Carprofen Gold's salt Persistin   Chloramphenicol Goody's Phenylbutazone   Chloromycetin Haltrain Piroxlcam   Clmetidine heparin Plaquenil   Cllnoril Hyco-pap Ponstel   Clofibrate Hydroxy chloroquine Propoxyphen         Before stopping any of these medications, be sure to consult the physician who ordered them.  Some, such as  Coumadin (Warfarin) are ordered to prevent or treat serious conditions such as "deep thrombosis", "pumonary embolisms", and other heart problems.  The amount of time that you may need off of the medication may also  vary with the medication and the reason for which you were taking it.  If you are taking any of these medications, please make sure you notify your pain physician before you undergo any procedures.         Facet Blocks Patient Information  Description: The facets are joints in the spine between the vertebrae.  Like any joints in the body, facets can become irritated and painful.  Arthritis can also effect the facets.  By injecting steroids and local anesthetic in and around these joints, we can temporarily block the nerve supply to them.  Steroids act directly on irritated nerves and tissues to reduce selling and inflammation which often leads to decreased pain.  Facet blocks may be done anywhere along the spine from the neck to the low back depending upon the location of your pain.   After numbing the skin with local anesthetic (like Novocaine), a small needle is passed onto the facet joints under x-ray guidance.  You may experience a sensation of pressure while this is being done.  The entire block usually lasts about 15-25 minutes.   Conditions which may be treated by facet blocks:   Low back/buttock pain  Neck/shoulder pain  Certain types of headaches  Preparation for the injection:  1. Do not eat any solid food or dairy products within 8 hours of your appointment. 2. You may drink clear liquid up to 3 hours before appointment.  Clear liquids include water, black coffee, juice or soda.  No milk or cream please. 3. You may take your regular medication, including pain medications, with a sip of water before your appointment.  Diabetics should hold regular insulin (if taken separately) and take 1/2 normal NPH dose the morning of the procedure.  Carry some sugar containing  items with you to your appointment. 4. A driver must accompany you and be prepared to drive you home after your procedure. 5. Bring all your current medications with you. 6. An IV may be inserted and sedation may be given at the discretion of the physician. 7. A blood pressure cuff, EKG and other monitors will often be applied during the procedure.  Some patients may need to have extra oxygen administered for a short period. 8. You will be asked to provide medical information, including your allergies and medications, prior to the procedure.  We must know immediately if you are taking blood thinners (like Coumadin/Warfarin) or if you are allergic to IV iodine contrast (dye).  We must know if you could possible be pregnant.  Possible side-effects:   Bleeding from needle site  Infection (rare, may require surgery)  Nerve injury (rare)  Numbness & tingling (temporary)  Difficulty urinating (rare, temporary)  Spinal headache (a headache worse with upright posture)  Light-headedness (temporary)  Pain at injection site (serveral days)  Decreased blood pressure (rare, temporary)  Weakness in arm/leg (temporary)  Pressure sensation in back/neck (temporary)   Call if you experience:   Fever/chills associated with headache or increased back/neck pain  Headache worsened by an upright position  New onset, weakness or numbness of an extremity below the injection site  Hives or difficulty breathing (go to the emergency room)  Inflammation or drainage at the injection site(s)  Severe back/neck pain greater than usual  New symptoms which are concerning to you  Please note:  Although the local anesthetic injected can often make your back or neck feel good for several hours after the injection, the pain will likely return. It takes 3-7  days for steroids to work.  You may not notice any pain relief for at least one week.  If effective, we will often do a series of 2-3 injections  spaced 3-6 weeks apart to maximally decrease your pain.  After the initial series, you may be a candidate for a more permanent nerve block of the facets.  If you have any questions, please call #[phone]) [facility]

## 2017-12-13 NOTE — Patient Instructions (Signed)
Pain Management Discharge Instructions  General Discharge Instructions :  If you need to reach your doctor call: Monday-Friday 8:00 am - 4:00 pm at [phone] or toll free [phone].  After clinic hours [phone] to have operator reach doctor.  Bring all of your medication bottles to all your appointments in the pain clinic.  To cancel or reschedule your appointment with Pain Management please remember to call 24 hours in advance to avoid a fee.  Refer to the educational materials which you have been given on: General Risks, I had my Procedure. Discharge Instructions, Post Sedation.  Post Procedure Instructions:  The drugs you were given will stay in your system until tomorrow, so for the next 24 hours you should not drive, make any legal decisions or drink any alcoholic beverages.  You may eat anything you prefer, but it is better to start with liquids then soups and crackers, and gradually work up to solid foods.  Please notify your doctor immediately if you have any unusual bleeding, trouble breathing or pain that is not related to your normal pain.  Depending on the type of procedure that was done, some parts of your body may feel week and/or numb.  This usually clears up by tonight or the next day.  Walk with the use of an assistive device or accompanied by an adult for the 24 hours.  You may use ice on the affected area for the first 24 hours.  Put ice in a Ziploc bag and cover with a towel and place against area 15 minutes on 15 minutes off.  You may switch to heat after 24 hours.GENERAL RISKS AND COMPLICATIONS  What are the risk, side effects and possible complications? Generally speaking, most procedures are safe.  However, with any procedure there are risks, side effects, and the possibility of complications.  The risks and complications are dependent upon the sites that are lesioned, or the type of nerve block to be performed.  The closer the procedure is to the spine,  the more serious the risks are.  Great care is taken when placing the radio frequency needles, block needles or lesioning probes, but sometimes complications can occur. 1. Infection: Any time there is an injection through the skin, there is a risk of infection.  This is why sterile conditions are used for these blocks.  There are four possible types of infection. 1. Localized skin infection. 2. Central Nervous System Infection-This can be in the form of Meningitis, which can be deadly. 3. Epidural Infections-This can be in the form of an epidural abscess, which can cause pressure inside of the spine, causing compression of the spinal cord with subsequent paralysis. This would require an emergency surgery to decompress, and there are no guarantees that the patient would recover from the paralysis. 4. Discitis-This is an infection of the intervertebral discs.  It occurs in about 1% of discography procedures.  It is difficult to treat and it may lead to surgery.        2. Pain: the needles have to go through skin and soft tissues, will cause soreness.       3. Damage to internal structures:  The nerves to be lesioned may be near blood vessels or    other nerves which can be potentially damaged.       4. Bleeding: Bleeding is more common if the patient is taking blood thinners such as  aspirin, Coumadin, Ticiid, Plavix, etc., or if he/she have some genetic predisposition  such as   hemophilia. Bleeding into the spinal canal can cause compression of the spinal  cord with subsequent paralysis.  This would require an emergency surgery to  decompress and there are no guarantees that the patient would recover from the  paralysis.       5. Pneumothorax:  Puncturing of a lung is a possibility, every time a needle is introduced in  the area of the chest or upper back.  Pneumothorax refers to free air around the  collapsed lung(s), inside of the thoracic cavity (chest cavity).  Another two possible  complications  related to a similar event would include: Hemothorax and Chylothorax.   These are variations of the Pneumothorax, where instead of air around the collapsed  lung(s), you may have blood or chyle, respectively.       6. Spinal headaches: They may occur with any procedures in the area of the spine.       7. Persistent CSF (Cerebro-Spinal Fluid) leakage: This is a rare problem, but may occur  with prolonged intrathecal or epidural catheters either due to the formation of a fistulous  track or a dural tear.       8. Nerve damage: By working so close to the spinal cord, there is always a possibility of  nerve damage, which could be as serious as a permanent spinal cord injury with  paralysis.       9. Death:  Although rare, severe deadly allergic reactions known as "Anaphylactic  reaction" can occur to any of the medications used.      10. Worsening of the symptoms:  We can always make thing worse.  What are the chances of something like this happening? Chances of any of this occuring are extremely low.  By statistics, you have more of a chance of getting killed in a motor vehicle accident: while driving to the hospital than any of the above occurring .  Nevertheless, you should be aware that they are possibilities.  In general, it is similar to taking a shower.  Everybody knows that you can slip, hit your head and get killed.  Does that mean that you should not shower again?  Nevertheless always keep in mind that statistics do not mean anything if you happen to be on the wrong side of them.  Even if a procedure has a 1 (one) in a 1,000,000 (million) chance of going wrong, it you happen to be that one..Also, keep in mind that by statistics, you have more of a chance of having something go wrong when taking medications.  Who should not have this procedure? If you are on a blood thinning medication (e.g. Coumadin, Plavix, see list of "Blood Thinners"), or if you have an active infection going on, you should not  have the procedure.  If you are taking any blood thinners, please inform your physician.  How should I prepare for this procedure?  Do not eat or drink anything at least six hours prior to the procedure.  Bring a driver with you .  It cannot be a taxi.  Come accompanied by an adult that can drive you back, and that is strong enough to help you if your legs get weak or numb from the local anesthetic.  Take all of your medicines the morning of the procedure with just enough water to swallow them.  If you have diabetes, make sure that you are scheduled to have your procedure done first thing in the morning, whenever possible.  If you have diabetes,   take only half of your insulin dose and notify our nurse that you have done so as soon as you arrive at the clinic.  If you are diabetic, but only take blood sugar pills (oral hypoglycemic), then do not take them on the morning of your procedure.  You may take them after you have had the procedure.  Do not take aspirin or any aspirin-containing medications, at least eleven (11) days prior to the procedure.  They may prolong bleeding.  Wear loose fitting clothing that may be easy to take off and that you would not mind if it got stained with Betadine or blood.  Do not wear any jewelry or perfume  Remove any nail coloring.  It will interfere with some of our monitoring equipment.  NOTE: Remember that this is not meant to be interpreted as a complete list of all possible complications.  Unforeseen problems may occur.  BLOOD THINNERS The following drugs contain aspirin or other products, which can cause increased bleeding during surgery and should not be taken for 2 weeks prior to and 1 week after surgery.  If you should need take something for relief of minor pain, you may take acetaminophen which is found in Tylenol,m Datril, Anacin-3 and Panadol. It is not blood thinner. The products listed below are.  Do not take any of the products listed below  in addition to any listed on your instruction sheet.  A.P.C or A.P.C with Codeine Codeine Phosphate Capsules #3 Ibuprofen Ridaura  ABC compound Congesprin Imuran rimadil  Advil Cope Indocin Robaxisal  Alka-Seltzer Effervescent Pain Reliever and Antacid Coricidin or Coricidin-D  Indomethacin Rufen  Alka-Seltzer plus Cold Medicine Cosprin Ketoprofen S-A-C Tablets  Anacin Analgesic Tablets or Capsules Coumadin Korlgesic Salflex  Anacin Extra Strength Analgesic tablets or capsules CP-2 Tablets Lanoril Salicylate  Anaprox Cuprimine Capsules Levenox Salocol  Anexsia-D Dalteparin Magan Salsalate  Anodynos Darvon compound Magnesium Salicylate Sine-off  Ansaid Dasin Capsules Magsal Sodium Salicylate  Anturane Depen Capsules Marnal Soma  APF Arthritis pain formula Dewitt's Pills Measurin Stanback  Argesic Dia-Gesic Meclofenamic Sulfinpyrazone  Arthritis Bayer Timed Release Aspirin Diclofenac Meclomen Sulindac  Arthritis pain formula Anacin Dicumarol Medipren Supac  Analgesic (Safety coated) Arthralgen Diffunasal Mefanamic Suprofen  Arthritis Strength Bufferin Dihydrocodeine Mepro Compound Suprol  Arthropan liquid Dopirydamole Methcarbomol with Aspirin Synalgos  ASA tablets/Enseals Disalcid Micrainin Tagament  Ascriptin Doan's Midol Talwin  Ascriptin A/D Dolene Mobidin Tanderil  Ascriptin Extra Strength Dolobid Moblgesic Ticlid  Ascriptin with Codeine Doloprin or Doloprin with Codeine Momentum Tolectin  Asperbuf Duoprin Mono-gesic Trendar  Aspergum Duradyne Motrin or Motrin IB Triminicin  Aspirin plain, buffered or enteric coated Durasal Myochrisine Trigesic  Aspirin Suppositories Easprin Nalfon Trillsate  Aspirin with Codeine Ecotrin Regular or Extra Strength Naprosyn Uracel  Atromid-S Efficin Naproxen Ursinus  Auranofin Capsules Elmiron Neocylate Vanquish  Axotal Emagrin Norgesic Verin  Azathioprine Empirin or Empirin with Codeine Normiflo Vitamin E  Azolid Emprazil Nuprin Voltaren  Bayer  Aspirin plain, buffered or children's or timed BC Tablets or powders Encaprin Orgaran Warfarin Sodium  Buff-a-Comp Enoxaparin Orudis Zorpin  Buff-a-Comp with Codeine Equegesic Os-Cal-Gesic   Buffaprin Excedrin plain, buffered or Extra Strength Oxalid   Bufferin Arthritis Strength Feldene Oxphenbutazone   Bufferin plain or Extra Strength Feldene Capsules Oxycodone with Aspirin   Bufferin with Codeine Fenoprofen Fenoprofen Pabalate or Pabalate-SF   Buffets II Flogesic Panagesic   Buffinol plain or Extra Strength Florinal or Florinal with Codeine Panwarfarin   Buf-Tabs Flurbiprofen Penicillamine   Butalbital Compound Four-way cold tablets   Penicillin   Butazolidin Fragmin Pepto-Bismol   Carbenicillin Geminisyn Percodan   Carna Arthritis Reliever Geopen Persantine   Carprofen Gold's salt Persistin   Chloramphenicol Goody's Phenylbutazone   Chloromycetin Haltrain Piroxlcam   Clmetidine heparin Plaquenil   Cllnoril Hyco-pap Ponstel   Clofibrate Hydroxy chloroquine Propoxyphen         Before stopping any of these medications, be sure to consult the physician who ordered them.  Some, such as Coumadin (Warfarin) are ordered to prevent or treat serious conditions such as "deep thrombosis", "pumonary embolisms", and other heart problems.  The amount of time that you may need off of the medication may also vary with the medication and the reason for which you were taking it.  If you are taking any of these medications, please make sure you notify your pain physician before you undergo any procedures.         Facet Blocks Patient Information  Description: The facets are joints in the spine between the vertebrae.  Like any joints in the body, facets can become irritated and painful.  Arthritis can also effect the facets.  By injecting steroids and local anesthetic in and around these joints, we can temporarily block the nerve supply to them.  Steroids act directly on irritated nerves and tissues  to reduce selling and inflammation which often leads to decreased pain.  Facet blocks may be done anywhere along the spine from the neck to the low back depending upon the location of your pain.   After numbing the skin with local anesthetic (like Novocaine), a small needle is passed onto the facet joints under x-ray guidance.  You may experience a sensation of pressure while this is being done.  The entire block usually lasts about 15-25 minutes.   Conditions which may be treated by facet blocks:   Low back/buttock pain  Neck/shoulder pain  Certain types of headaches  Preparation for the injection:  1. Do not eat any solid food or dairy products within 8 hours of your appointment. 2. You may drink clear liquid up to 3 hours before appointment.  Clear liquids include water, black coffee, juice or soda.  No milk or cream please. 3. You may take your regular medication, including pain medications, with a sip of water before your appointment.  Diabetics should hold regular insulin (if taken separately) and take 1/2 normal NPH dose the morning of the procedure.  Carry some sugar containing items with you to your appointment. 4. A driver must accompany you and be prepared to drive you home after your procedure. 5. Bring all your current medications with you. 6. An IV may be inserted and sedation may be given at the discretion of the physician. 7. A blood pressure cuff, EKG and other monitors will often be applied during the procedure.  Some patients may need to have extra oxygen administered for a short period. 8. You will be asked to provide medical information, including your allergies and medications, prior to the procedure.  We must know immediately if you are taking blood thinners (like Coumadin/Warfarin) or if you are allergic to IV iodine contrast (dye).  We must know if you could possible be pregnant.  Possible side-effects:   Bleeding from needle site  Infection (rare, may require  surgery)  Nerve injury (rare)  Numbness & tingling (temporary)  Difficulty urinating (rare, temporary)  Spinal headache (a headache worse with upright posture)  Light-headedness (temporary)  Pain at injection site (serveral days)  Decreased blood pressure (rare,  temporary)  Weakness in arm/leg (temporary)  Pressure sensation in back/neck (temporary)   Call if you experience:   Fever/chills associated with headache or increased back/neck pain  Headache worsened by an upright position  New onset, weakness or numbness of an extremity below the injection site  Hives or difficulty breathing (go to the emergency room)  Inflammation or drainage at the injection site(s)  Severe back/neck pain greater than usual  New symptoms which are concerning to you  Please note:  Although the local anesthetic injected can often make your back or neck feel good for several hours after the injection, the pain will likely return. It takes 3-7 days for steroids to work.  You may not notice any pain relief for at least one week.  If effective, we will often do a series of 2-3 injections spaced 3-6 weeks apart to maximally decrease your pain.  After the initial series, you may be a candidate for a more permanent nerve block of the facets.  If you have any questions, please call #[phone]) [facility]

## 2017-12-13 NOTE — Progress Notes (Signed)
Safety precautions to be maintained throughout the outpatient stay will include: orient to surroundings, keep bed in low position, maintain call bell within reach at all times, provide assistance with transfer out of bed and ambulation.  

## 2017-12-14 LAB — COMP. METABOLIC PANEL (12)
ALBUMIN: 4.1 g/dL (ref 3.6–4.8)
AST: 18 IU/L (ref 0–40)
Albumin/Globulin Ratio: 2 (ref 1.2–2.2)
Alkaline Phosphatase: 89 IU/L (ref 39–117)
BUN/Creatinine Ratio: 9 — ABNORMAL LOW (ref 12–28)
BUN: 7 mg/dL — ABNORMAL LOW (ref 8–27)
Bilirubin Total: <0.2 mg/dL (ref 0.0–1.2)
CALCIUM: 9.3 mg/dL (ref 8.7–10.3)
Chloride: 97 mmol/L (ref 96–106)
Creatinine, Ser: 0.75 mg/dL (ref 0.57–1.00)
GFR, EST AFRICAN AMERICAN: 98 mL/min/{1.73_m2} (ref >59)
GFR, EST NON AFRICAN AMERICAN: 85 mL/min/{1.73_m2} (ref >59)
GLOBULIN, TOTAL: 2.1 g/dL (ref 1.5–4.5)
GLUCOSE: 76 mg/dL (ref 65–99)
POTASSIUM: 5.4 mmol/L — AB (ref 3.5–5.2)
SODIUM: 135 mmol/L (ref 134–144)
TOTAL PROTEIN: 6.2 g/dL (ref 6.0–8.5)

## 2017-12-27 ENCOUNTER — Encounter: Payer: Self-pay | Admitting: Pain Medicine

## 2017-12-27 ENCOUNTER — Ambulatory Visit (HOSPITAL_BASED_OUTPATIENT_CLINIC_OR_DEPARTMENT_OTHER): Payer: BLUE CROSS/BLUE SHIELD | Admitting: Pain Medicine

## 2017-12-27 ENCOUNTER — Other Ambulatory Visit: Payer: Self-pay

## 2017-12-27 ENCOUNTER — Ambulatory Visit
Admission: RE | Admit: 2017-12-27 | Discharge: 2017-12-27 | Disposition: A | Discharge status: Home / Self Care | Payer: BLUE CROSS/BLUE SHIELD | Source: Ambulatory Visit | Attending: Pain Medicine | Admitting: Pain Medicine

## 2017-12-27 VITALS — BP 112/96 | HR 91 | Temp 97.1°F | Resp 16 | Ht 61.0 in | Wt 107.0 lb

## 2017-12-27 DIAGNOSIS — Z885 Allergy status to narcotic agent status: Secondary | ICD-10-CM | POA: Diagnosis not present

## 2017-12-27 DIAGNOSIS — M546 Pain in thoracic spine: Secondary | ICD-10-CM | POA: Insufficient documentation

## 2017-12-27 DIAGNOSIS — M47815 Spondylosis without myelopathy or radiculopathy, thoracolumbar region: Secondary | ICD-10-CM | POA: Insufficient documentation

## 2017-12-27 DIAGNOSIS — G8929 Other chronic pain: Secondary | ICD-10-CM | POA: Diagnosis present

## 2017-12-27 DIAGNOSIS — M47816 Spondylosis without myelopathy or radiculopathy, lumbar region: Secondary | ICD-10-CM | POA: Diagnosis not present

## 2017-12-27 DIAGNOSIS — M47894 Other spondylosis, thoracic region: Secondary | ICD-10-CM

## 2017-12-27 DIAGNOSIS — M47814 Spondylosis without myelopathy or radiculopathy, thoracic region: Secondary | ICD-10-CM

## 2017-12-27 MED ORDER — MIDAZOLAM HCL 5 MG/5ML IJ SOLN
1.0000 mg | INTRAMUSCULAR | Status: DC | PRN
  Administered 2017-12-27: 2 mg via INTRAVENOUS

## 2017-12-27 MED ORDER — ROPIVACAINE HCL 2 MG/ML IJ SOLN
INTRAMUSCULAR | Status: AC
  Filled 2017-12-27: qty 10

## 2017-12-27 MED ORDER — LACTATED RINGERS IV SOLN: 1000.0000 mL | Freq: Once | INTRAVENOUS | Status: DC

## 2017-12-27 MED ORDER — LIDOCAINE HCL 2 % IJ SOLN
20.0000 mL | Freq: Once | INTRAMUSCULAR | Status: AC
  Administered 2017-12-27: 200 mg

## 2017-12-27 MED ORDER — MIDAZOLAM HCL 5 MG/5ML IJ SOLN
INTRAMUSCULAR | Status: AC
  Filled 2017-12-27: qty 5

## 2017-12-27 MED ORDER — TRIAMCINOLONE ACETONIDE 40 MG/ML IJ SUSP
80.0000 mg | Freq: Once | INTRAMUSCULAR | Status: AC
  Administered 2017-12-27: 80 mg

## 2017-12-27 MED ORDER — LIDOCAINE HCL (PF) 2 % IJ SOLN
INTRAMUSCULAR | Status: AC
  Filled 2017-12-27: qty 10

## 2017-12-27 MED ORDER — ROPIVACAINE HCL 2 MG/ML IJ SOLN
18.0000 mL | Freq: Once | INTRAMUSCULAR | Status: AC
  Administered 2017-12-27: 10 mL via PERINEURAL

## 2017-12-27 MED ORDER — FENTANYL CITRATE (PF) 100 MCG/2ML IJ SOLN
INTRAMUSCULAR | Status: AC
  Filled 2017-12-27: qty 2

## 2017-12-27 MED ORDER — FENTANYL CITRATE (PF) 100 MCG/2ML IJ SOLN: 25.0000 ug | INTRAMUSCULAR | Status: DC | PRN

## 2017-12-27 MED ORDER — TRIAMCINOLONE ACETONIDE 40 MG/ML IJ SUSP
INTRAMUSCULAR | Status: AC
  Filled 2017-12-27: qty 2

## 2017-12-27 NOTE — Patient Instructions (Signed)

## 2017-12-27 NOTE — Progress Notes (Signed)
Safety precautions to be maintained throughout the outpatient stay will include: orient to surroundings, keep bed in low position, maintain call bell within reach at all times, provide assistance with transfer out of bed and ambulation.  

## 2017-12-27 NOTE — Progress Notes (Signed)
Patient's Name: Deanna Jacobson  MRN: [id number]  Referring Provider: [facility], [facility]  DOB: 08-01-1955  PCP: [facility], [place]  DOS: 12/27/2017  Note by: Gaspar Cola, MD  Service setting: Ambulatory outpatient  Specialty: Interventional Pain Management  Patient type: Established  Location: [facility] (AMB) Pain Management Facility  Visit type: Interventional Procedure   Primary Reason for Visit: Interventional Pain Management Treatment. CC: Procedure (Lumbar facet block )  Procedure:          Anesthesia, Analgesia, Anxiolysis:  Type: Lumbar Facet, Medial Branch Block(s)          Primary Purpose: Diagnostic Region: Posterolateral Lumbosacral Spine Level: T12, L1, L2, &L3 Medial Branch Level(s). Laterality: Bilateral   Type: Moderate (Conscious) Sedation combined with Local Anesthesia Indication(s): Analgesia and Anxiety Route: Intravenous (IV) IV Access: Secured Sedation: Meaningful verbal contact was maintained at all times during the procedure  Local Anesthetic: Lidocaine 1-2%   Indications: 1. Spondylosis without myelopathy or radiculopathy, thoracolumbar region   2. Thoracic facet syndrome (Bilateral)   3. Thoracic spondylosis   4. Lumbar facet joint syndrome (Bilateral)   5. Lumbar facet arthropathy (HCC)   6. Chronic bilateral thoracic back pain    Pain Score: Pre-procedure: 2 /10 Post-procedure: 0-No pain/10  Pre-op Assessment:  Deanna Jacobson is a 62 y.o. (year old), adult patient, seen today for interventional treatment. She  has a past surgical history that includes Breast enhancement surgery. Deanna Jacobson has a current medication list which includes the following prescription(s): amlodipine, aspirin-acetaminophen, cyclobenzaprine, gabapentin, hydroxyzine, ibuprofen, lisinopril, and trazodone, and the following Facility-Administered Medications: fentanyl, lactated ringers, and midazolam. Her primarily concern today is the Procedure (Lumbar  facet block )  Initial Vital Signs:  Pulse/HCG Rate: 91ECG Heart Rate: 89 Temp: 97.9 F (36.6 C) Resp: 18 BP: (!) 178/76 SpO2: 97 %  BMI: Estimated body mass index is 20.22 kg/m as calculated from the following:   Height as of this encounter: 5\' 1"  (1.549 m).   Weight as of this encounter: 107 lb (48.5 kg).  Risk Assessment: Allergies: Reviewed. She is allergic to codeine.  Allergy Precautions: None required Coagulopathies: Reviewed. None identified.  Blood-thinner therapy: None at this time Active Infection(s): Reviewed. None identified. Deanna Jacobson is afebrile  Site Confirmation: Deanna Jacobson was asked to confirm the procedure and laterality before marking the site Procedure checklist: Completed Consent: Before the procedure and under the influence of no sedative(s), amnesic(s), or anxiolytics, the patient was informed of the treatment options, risks and possible complications. To fulfill our ethical and legal obligations, as recommended by the American Medical Association's Code of Ethics, I have informed the patient of my clinical impression; the nature and purpose of the treatment or procedure; the risks, benefits, and possible complications of the intervention; the alternatives, including doing nothing; the risk(s) and benefit(s) of the alternative treatment(s) or procedure(s); and the risk(s) and benefit(s) of doing nothing. The patient was provided information about the general risks and possible complications associated with the procedure. These may include, but are not limited to: failure to achieve desired goals, infection, bleeding, organ or nerve damage, allergic reactions, paralysis, and death. In addition, the patient was informed of those risks and complications associated to Spine-related procedures, such as failure to decrease pain; infection (i.e.: Meningitis, epidural or intraspinal abscess); bleeding (i.e.: epidural hematoma, subarachnoid hemorrhage, or any other  type of intraspinal or peri-dural bleeding); organ or nerve damage (i.e.: Any type of peripheral nerve, nerve root, or spinal cord injury) with  subsequent damage to sensory, motor, and/or autonomic systems, resulting in permanent pain, numbness, and/or weakness of one or several areas of the body; allergic reactions; (i.e.: anaphylactic reaction); and/or death. Furthermore, the patient was informed of those risks and complications associated with the medications. These include, but are not limited to: allergic reactions (i.e.: anaphylactic or anaphylactoid reaction(s)); adrenal axis suppression; blood sugar elevation that in diabetics may result in ketoacidosis or comma; water retention that in patients with history of congestive heart failure may result in shortness of breath, pulmonary edema, and decompensation with resultant heart failure; weight gain; swelling or edema; medication-induced neural toxicity; particulate matter embolism and blood vessel occlusion with resultant organ, and/or nervous system infarction; and/or aseptic necrosis of one or more joints. Finally, the patient was informed that Medicine is not an exact science; therefore, there is also the possibility of unforeseen or unpredictable risks and/or possible complications that may result in a catastrophic outcome. The patient indicated having understood very clearly. We have given the patient no guarantees and we have made no promises. Enough time was given to the patient to ask questions, all of which were answered to the patient's satisfaction. Deanna Jacobson has indicated that she wanted to continue with the procedure. Attestation: I, the ordering provider, attest that I have discussed with the patient the benefits, risks, side-effects, alternatives, likelihood of achieving goals, and potential problems during recovery for the procedure that I have provided informed consent. Date  Time: 12/27/2017  8:55 AM  Pre-Procedure Preparation:    Monitoring: As per clinic protocol. Respiration, ETCO2, SpO2, BP, heart rate and rhythm monitor placed and checked for adequate function Safety Precautions: Patient was assessed for positional comfort and pressure points before starting the procedure. Time-out: I initiated and conducted the "Time-out" before starting the procedure, as per protocol. The patient was asked to participate by confirming the accuracy of the "Time Out" information. Verification of the correct person, site, and procedure were performed and confirmed by me, the nursing staff, and the patient. "Time-out" conducted as per Joint Commission's Universal Protocol (UP.01.01.01). Time: 0952  Description of Procedure:          Position: Prone Laterality: Bilateral. The procedure was performed in identical fashion on both sides. Levels:  T12, L1, L2, &L3 Medial Branch Level(s) Area Prepped: Posterior Lumbosacral Region Prepping solution: ChloraPrep (2% chlorhexidine gluconate and 70% isopropyl alcohol) Safety Precautions: Aspiration looking for blood return was conducted prior to all injections. At no point did we inject any substances, as a needle was being advanced. Before injecting, the patient was told to immediately notify me if she was experiencing any new onset of "ringing in the ears, or metallic taste in the mouth". No attempts were made at seeking any paresthesias. Safe injection practices and needle disposal techniques used. Medications properly checked for expiration dates. SDV (single dose vial) medications used. After the completion of the procedure, all disposable equipment used was discarded in the proper designated medical waste containers. Local Anesthesia: Protocol guidelines were followed. The patient was positioned over the fluoroscopy table. The area was prepped in the usual manner. The time-out was completed. The target area was identified using fluoroscopy. A 12-in long, straight, sterile hemostat was used with  fluoroscopic guidance to locate the targets for each level blocked. Once located, the skin was marked with an approved surgical skin marker. Once all sites were marked, the skin (epidermis, dermis, and hypodermis), as well as deeper tissues (fat, connective tissue and muscle) were infiltrated with a small  amount of a short-acting local anesthetic, loaded on a 10cc syringe with a 25G, 1.5-in  Needle. An appropriate amount of time was allowed for local anesthetics to take effect before proceeding to the next step. Local Anesthetic: Lidocaine 2.0% The unused portion of the local anesthetic was discarded in the proper designated containers. Technical explanation of process:  T12 Medial Branch Nerve Block (MBB): The target area for the T12 medial branch is at the junction of the postero-lateral aspect of the superior articular process and the superior, posterior, and medial edge of the transverse process of L1. Here the medial branch is found more lateral within the transverse process. Under fluoroscopic guidance, a Quincke needle was inserted until contact was made with os over the superior postero-lateral aspect of the pedicular shadow (target area). After negative aspiration for blood, 0.5 mL of the nerve block solution was injected without difficulty or complication. The needle was removed intact. L1 Medial Branch Nerve Block (MBB): The target area for the L1 medial branch is at the junction of the postero-lateral aspect of the superior articular process and the superior, posterior, and medial edge of the transverse process of L2. Under fluoroscopic guidance, a Quincke needle was inserted until contact was made with os over the superior postero-lateral aspect of the pedicular shadow (target area). After negative aspiration for blood, 0.5 mL of the nerve block solution was injected without difficulty or complication. The needle was removed intact. L2 Medial Branch Nerve Block (MBB): The target area for the L2  medial branch is at the junction of the postero-lateral aspect of the superior articular process and the superior, posterior, and medial edge of the transverse process of L3. Under fluoroscopic guidance, a Quincke needle was inserted until contact was made with os over the superior postero-lateral aspect of the pedicular shadow (target area). After negative aspiration for blood, 0.5 mL of the nerve block solution was injected without difficulty or complication. The needle was removed intact. L3 Medial Branch Nerve Block (MBB): The target area for the L3 medial branch is at the junction of the postero-lateral aspect of the superior articular process and the superior, posterior, and medial edge of the transverse process of L4. Under fluoroscopic guidance, a Quincke needle was inserted until contact was made with os over the superior postero-lateral aspect of the pedicular shadow (target area). After negative aspiration for blood, 0.5 mL of the nerve block solution was injected without difficulty or complication. The needle was removed intact.  Procedural Needles: 22-gauge, 3.5-inch, Quincke needles used for all levels. Nerve block solution: 0.2% PF-Ropivacaine + Triamcinolone (40 mg/mL) diluted to a final concentration of 4 mg of Triamcinolone/mL of Ropivacaine The unused portion of the solution was discarded in the proper designated containers.  Once the entire procedure was completed, the treated area was cleaned, making sure to leave some of the prepping solution back to take advantage of its long term bactericidal properties.   Illustration of the posterior view of the lumbar spine and the posterior neural structures. Laminae of L2 through S1 are labeled. DPRL5, dorsal primary ramus of L5; DPRS1, dorsal primary ramus of S1; DPR3, dorsal primary ramus of L3; FJ, facet (zygapophyseal) joint L3-L4; I, inferior articular process of L4; LB1, lateral branch of dorsal primary ramus of L1; IAB, inferior articular  branches from L3 medial branch (supplies L4-L5 facet joint); IBP, intermediate branch plexus; MB3, medial branch of dorsal primary ramus of L3; NR3, third lumbar nerve root; S, superior articular process of L5; SAB, superior  articular branches from L4 (supplies L4-5 facet joint also); TP3, transverse process of L3.  Vitals:   12/27/17 1004 12/27/17 1010 12/27/17 1019 12/27/17 1031  BP: (!) 146/77 (!) 147/81 (!) 147/83 (!) 112/96  Pulse:      Resp: 20 17 11 16   Temp:  (!) 97.1 F (36.2 C)  (!) 97.1 F (36.2 C)  TempSrc:  Temporal    SpO2: 98% 100% 100% 100%  Weight:      Height:        Start Time: 0952 hrs. End Time: 1003 hrs.  Imaging Guidance (Spinal):          Type of Imaging Technique: Fluoroscopy Guidance (Spinal) Indication(s): Assistance in needle guidance and placement for procedures requiring needle placement in or near specific anatomical locations not easily accessible without such assistance. Exposure Time: Please see nurses notes. Contrast: None used. Fluoroscopic Guidance: I was personally present during the use of fluoroscopy. "Tunnel Vision Technique" used to obtain the best possible view of the target area. Parallax error corrected before commencing the procedure. "Direction-depth-direction" technique used to introduce the needle under continuous pulsed fluoroscopy. Once target was reached, antero-posterior, oblique, and lateral fluoroscopic projection used confirm needle placement in all planes. Images permanently stored in EMR. Interpretation: No contrast injected. I personally interpreted the imaging intraoperatively. Adequate needle placement confirmed in multiple planes. Permanent images saved into the patient's record.  Antibiotic Prophylaxis:   Anti-infectives (From admission, onward)   None     Indication(s): None identified  Post-operative Assessment:  Post-procedure Vital Signs:  Pulse/HCG Rate: 9185 Temp: (!) 97.1 F (36.2 C) Resp: 16 BP: (!)  112/96 SpO2: 100 %  EBL: None  Complications: No immediate post-treatment complications observed by team, or reported by patient.  Note: The patient tolerated the entire procedure well. A repeat set of vitals were taken after the procedure and the patient was kept under observation following institutional policy, for this type of procedure. Post-procedural neurological assessment was performed, showing return to baseline, prior to discharge. The patient was provided with post-procedure discharge instructions, including a section on how to identify potential problems. Should any problems arise concerning this procedure, the patient was given instructions to immediately contact us, at any time, without hesitation. In any case, we plan to contact the patient by telephone for a follow-up status report regarding this interventional procedure.  Comments:  No additional relevant information.  Plan of Care    Imaging Orders     DG C-Arm 1-60 Min-No Report  Procedure Orders     LUMBAR FACET(MEDIAL BRANCH NERVE BLOCK) MBNB     THORACIC FACET BLOCK  Medications ordered for procedure: Meds ordered this encounter  Medications  . lidocaine (XYLOCAINE) 2 % (with pres) injection 400 mg  . midazolam (VERSED) 5 MG/5ML injection 1-2 mg    Make sure Flumazenil is available in the pyxis when using this medication. If oversedation occurs, administer 0.2 mg IV over 15 sec. If after 45 sec no response, administer 0.2 mg again over 1 min; may repeat at 1 min intervals; not to exceed 4 doses (1 mg)  . fentaNYL (SUBLIMAZE) injection 25-50 mcg    Make sure Narcan is available in the pyxis when using this medication. In the event of respiratory depression (RR< 8/min): Titrate NARCAN (naloxone) in increments of 0.1 to 0.2 mg IV at 2-3 minute intervals, until desired degree of reversal.  . lactated ringers infusion 1,000 mL  . ropivacaine (PF) 2 mg/mL (0.2%) (NAROPIN) injection 18 mL  .  triamcinolone acetonide  (KENALOG-40) injection 80 mg   Medications administered: We administered lidocaine, midazolam, ropivacaine (PF) 2 mg/mL (0.2%), and triamcinolone acetonide.  See the medical record for exact dosing, route, and time of administration.  New Prescriptions   No medications on file   Disposition: Discharge home  Discharge Date & Time: 12/27/2017; 1032 hrs.   Physician-requested Follow-up: Return for post-procedure eval (2 wks).  Future Appointments  Date Time Provider [place]  01/18/2018  8:15 AM Milinda Pointer, MD [facility] None  03/14/2018  9:00 AM Vevelyn Francois, NP [facility] None   Primary Care Physician: [facility], [place] Location: [facility] Note by: Gaspar Cola, MD Date: 12/27/2017; Time: 12:06 PM  Disclaimer:  Medicine is not an Chief Strategy Officer. The only guarantee in medicine is that nothing is guaranteed. It is important to note that the decision to proceed with this intervention was based on the information collected from the patient. The Data and conclusions were drawn from the patient's questionnaire, the interview, and the physical examination. Because the information was provided in large part by the patient, it cannot be guaranteed that it has not been purposely or unconsciously manipulated. Every effort has been made to obtain as much relevant data as possible for this evaluation. It is important to note that the conclusions that lead to this procedure are derived in large part from the available data. Always take into account that the treatment will also be dependent on availability of resources and existing treatment guidelines, considered by other Pain Management Practitioners as being common knowledge and practice, at the time of the intervention. For Medico-Legal purposes, it is also important to point out that variation in procedural techniques and pharmacological choices are the acceptable norm. The  indications, contraindications, technique, and results of the above procedure should only be interpreted and judged by a Board-Certified Interventional Pain Specialist with extensive familiarity and expertise in the same exact procedure and technique.

## 2017-12-28 ENCOUNTER — Telehealth: Payer: Self-pay | Admitting: *Deleted

## 2017-12-28 NOTE — Telephone Encounter (Signed)
No problems post procedure. 

## 2018-01-17 NOTE — Progress Notes (Signed)
Patient's Name: Deanna Jacobson  MRN: [id number]  Referring Provider: Center, [facility]  DOB: 01-12-1956  PCP: Center, Ila  DOS: 01/18/2018  Note by: Gaspar Cola, MD  Service setting: Ambulatory outpatient  Specialty: Interventional Pain Management  Location: [facility] (AMB) Pain Management Facility    Patient type: Established   Primary Reason(s) for Visit: Encounter for post-procedure evaluation of chronic illness with mild to moderate exacerbation CC: Back Pain (mid)  HPI  [name] is a 62 y.o. year old, adult patient, who comes today for a post-procedure evaluation. She has Acute renal failure (ARF) ([place]); Long term current use of opiate analgesic; Long term prescription opiate use; Opiate use; Encounter for therapeutic drug level monitoring; Encounter for pain management planning; Chronic thoracic spine pain (midline); Thoracic facet syndrome (Bilateral); Lumbar facet arthropathy ([place]); Musculoskeletal pain; Neurogenic pain; Osteoarthritis; Chronic pain syndrome; Thoracic spondylosis; Marijuana use; Lumbar facet joint syndrome (Bilateral); Lumbar spondylosis; Spondylosis without myelopathy or radiculopathy, thoracolumbar region; and Chronic thoracic back pain (Bilateral) on their problem list. Her primarily concern today is the Back Pain (mid)  Pain Assessment: Location: Lower, Medial Back Radiating: Denies Onset: More than a month ago Duration: Chronic pain Quality: Burning, Aching, Constant, Discomfort Severity: 5 /10 (subjective, self-reported pain score)  Note: Reported level is compatible with observation.                         When using our objective Pain Scale, levels between 6 and 10/10 are said to belong in an emergency room, as it progressively worsens from a 6/10, described as severely limiting, requiring emergency care not usually available at an outpatient pain management facility. At a 6/10 level, communication becomes difficult and  requires great effort. Assistance to reach the emergency department may be required. Facial flushing and profuse sweating along with potentially dangerous increases in heart rate and blood pressure will be evident. Effect on ADL: sitting down brothers me the most and rainy days Timing: Constant Modifying factors: medications, CBD oil BP: (!) 159/70  HR: 86  [name] comes in today for post-procedure evaluation after the treatment done on 12/28/2017. The patient comes in today clinics today after having had a bilateral thoracolumbar facet block to determine if her radiofrequency has worn off. The results would suggest that it did and therefore we will go ahead and plan to repeat it. He has been more than a year since she had the radiofrequency. She also indicates that she is taking CBD oil, which apparently has been helping her. She was able to tolerate the gabapentin and therefore we will continue to titrate this up, as tolerated. She was provided today with written instructions on how to do so. I have also given her a new prescription so that she can continue titrating the medication up. While she is taking 3 tablets at a time, we will switch her from the 100 mg tablets 2-300 mg tablet. She has requested that we start the radiofrequency on the left side first.  Further details on both, my assessment(s), as well as the proposed treatment plan, please see below.  Post-Procedure Assessment  12/27/2017 Procedure: Diagnostic bilateral thoracolumbar T12, L1, L2, &L3 Medial Branch block #3 under fluoroscopic guidance and IV sedation  Pre-procedure pain score:  2/10 Post-procedure pain score: 0/10 (100% relief) Influential Factors: BMI: 20.22 kg/m Intra-procedural challenges: None observed.         Assessment challenges: None detected.  Reported side-effects: None.        Post-procedural adverse reactions or complications: None reported         Sedation: Sedation provided. When no  sedatives are used, the analgesic levels obtained are directly associated to the effectiveness of the local anesthetics. However, when sedation is provided, the level of analgesia obtained during the initial 1 hour following the intervention, is believed to be the result of a combination of factors. These factors may include, but are not limited to: 1. The effectiveness of the local anesthetics used. 2. The effects of the analgesic(s) and/or anxiolytic(s) used. 3. The degree of discomfort experienced by the patient at the time of the procedure. 4. The patients ability and reliability in recalling and recording the events. 5. The presence and influence of possible secondary gains and/or psychosocial factors. Reported result: Relief experienced during the 1st hour after the procedure: 100 % (Ultra-Short Term Relief)            Interpretative annotation: Clinically appropriate result. Analgesia during this period is likely to be Local Anesthetic and/or IV Sedative (Analgesic/Anxiolytic) related.          Effects of local anesthetic: The analgesic effects attained during this period are directly associated to the localized infiltration of local anesthetics and therefore cary significant diagnostic value as to the etiological location, or anatomical origin, of the pain. Expected duration of relief is directly dependent on the pharmacodynamics of the local anesthetic used. Long-acting (4-6 hours) anesthetics used.  Reported result: Relief during the next 4 to 6 hour after the procedure: 100 %(for 5 to 6 days had no pain) (Short-Term Relief)            Interpretative annotation: Clinically appropriate result. Analgesia during this period is likely to be Local Anesthetic-related.          Long-term benefit: Defined as the period of time past the expected duration of local anesthetics (1 hour for short-acting and 4-6 hours for long-acting). With the possible exception of prolonged sympathetic blockade from the  local anesthetics, benefits during this period are typically attributed to, or associated with, other factors such as analgesic sensory neuropraxia, antiinflammatory effects, or beneficial biochemical changes provided by agents other than the local anesthetics.  Reported result: Extended relief following procedure: 0 % (Long-Term Relief)            Interpretative annotation: Clinically possible results. No benefit. Therapeutic failure. Limited inflammation. Possible mechanical aggravating factors.          Current benefits: Defined as reported results that persistent at this point in time.   Analgesia: 0 %            Function: Back to baseline ROM: Back to baseline Interpretative annotation: Recurrence of symptoms. No permanent benefit expected. Effective diagnostic intervention.          Interpretation: Results would suggest a successful diagnostic intervention.                  Plan:  In view of these results, we will plan on repeating her radiofrequency. The last time that she had it done was more than 1 year ago and she attains 75% relief of the pain from the procedure. Unfortunately, it has worn off. She has requested that we start with the left side.                Laboratory Chemistry  Inflammation Markers (CRP: Acute Phase) (ESR: Chronic Phase) Lab Results  Component Value Date  CRP 0.4 12/23/2016   ESRSEDRATE 7 12/23/2016                         Renal Markers Lab Results  Component Value Date   BUN 7 (L) 12/13/2017   CREATININE 0.75 12/13/2017   BCR 9 (L) 12/13/2017   GFRAA 98 12/13/2017   GFRNONAA 85 12/13/2017                             Hepatic Markers Lab Results  Component Value Date   AST 18 12/13/2017   ALT 12 (L) 02/02/2017   ALBUMIN 4.1 12/13/2017                        Neuropathy Markers Lab Results  Component Value Date   VITAMINB12 734 12/23/2016                        Hematology Parameters Lab Results  Component Value Date   PLT 325 12/23/2016    HGB 12.8 12/23/2016   HCT 39.9 12/23/2016                        CV Markers Lab Results  Component Value Date   TROPONINI <0.03 02/01/2016                         Note: Lab results reviewed.  Recent Imaging Results   Results for orders placed in visit on 12/27/17  DG C-Arm 1-60 Min-No Report   Narrative Fluoroscopy was utilized by the requesting physician.  No radiographic  interpretation.    Interpretation Report: Fluoroscopy was used during the procedure to assist with needle guidance. The images were interpreted intraoperatively by the requesting physician.  Meds   Current Outpatient Medications:  .  amLODipine (NORVASC) 2.5 MG tablet, Take 2.5 mg by mouth., Disp: , Rfl:  .  Aspirin-Acetaminophen (GOODYS BODY PAIN PO), Take by mouth 2 (two) times daily as needed., Disp: , Rfl:  .  cyclobenzaprine (FLEXERIL) 10 MG tablet, Take 1 tablet (10 mg total) by mouth every 8 (eight) hours as needed for muscle spasms., Disp: 90 tablet, Rfl: 2 .  gabapentin (NEURONTIN) 100 MG capsule, Take 1-3 capsules (100-300 mg total) by mouth 4 (four) times daily. Follow the written titration schedule., Disp: 360 capsule, Rfl: 2 .  hydrOXYzine (ATARAX/VISTARIL) 10 MG tablet, Take 10 mg by mouth., Disp: , Rfl:  .  ibuprofen (ADVIL,MOTRIN) 800 MG tablet, TAKE 1 TABLET (800 MG TOTAL) BY MOUTH 3 (THREE) TIMES DAILY AS NEEDED FOR PAIN WITH FOOD, Disp: , Rfl: 1 .  lisinopril (PRINIVIL,ZESTRIL) 20 MG tablet, Take 1 tablet (20 mg total) by mouth daily., Disp: 30 tablet, Rfl: 2 .  Misc Natural Products (T-RELIEF CBD+13) SUBL, Place under the tongue., Disp: , Rfl:  .  traZODone (DESYREL) 100 MG tablet, Take 100 mg by mouth at bedtime. , Disp: , Rfl: 4  ROS  Constitutional: Denies any fever or chills Gastrointestinal: No reported hemesis, hematochezia, vomiting, or acute GI distress Musculoskeletal: Denies any acute onset joint swelling, redness, loss of ROM, or weakness Neurological: No reported episodes of  acute onset apraxia, aphasia, dysarthria, agnosia, amnesia, paralysis, loss of coordination, or loss of consciousness  Allergies  Ms. Meigs is allergic to codeine.  [place]  Drug: Ms. Seebeck  reports that  she has current or past drug history. Drug: Marijuana. Alcohol:  reports that she drinks alcohol. Tobacco:  reports that she has been smoking cigarettes. She has been smoking about 1.00 pack per day. She has never used smokeless tobacco. Medical:  has a past medical history of Chronic back pain and Hypertension. Surgical: Ms. Zeis  has a past surgical history that includes Breast enhancement surgery. Family: family history includes Cancer in her mother; Hypertension in her father.  Constitutional Exam  General appearance: Well nourished, well developed, and well hydrated. In no apparent acute distress Vitals:   01/18/18 0816  BP: (!) 159/70  Pulse: 86  Temp: 98 F (36.7 C)  SpO2: 100%  Weight: 107 lb (48.5 kg)  Height: '5\' 1"'  (1.549 m)   BMI Assessment: Estimated body mass index is 20.22 kg/m as calculated from the following:   Height as of this encounter: '5\' 1"'  (1.549 m).   Weight as of this encounter: 107 lb (48.5 kg).  BMI interpretation table: BMI level Category Range association with higher incidence of chronic pain  <18 kg/m2 Underweight   18.5-24.9 kg/m2 Ideal body weight   25-29.9 kg/m2 Overweight Increased incidence by 20%  30-34.9 kg/m2 Obese (Class I) Increased incidence by 68%  35-39.9 kg/m2 Severe obesity (Class II) Increased incidence by 136%  >40 kg/m2 Extreme obesity (Class III) Increased incidence by 254%   Patient's current BMI Ideal Body weight  Body mass index is 20.22 kg/m. Ideal body weight: 47.8 kg (105 lb 6.1 oz) Adjusted ideal body weight: 48.1 kg (106 lb 0.4 oz)   BMI Readings from Last 4 Encounters:  01/18/18 20.22 kg/m  12/27/17 20.22 kg/m  12/13/17 20.22 kg/m  09/13/17 19.84 kg/m   Wt Readings from Last 4 Encounters:   01/18/18 107 lb (48.5 kg)  12/27/17 107 lb (48.5 kg)  12/13/17 107 lb (48.5 kg)  09/13/17 105 lb (47.6 kg)  Psych/Mental status: Alert, oriented x 3 (person, place, & time)       Eyes: PERLA Respiratory: No evidence of acute respiratory distress  Cervical Spine Area Exam  Skin & Axial Inspection: No masses, redness, edema, swelling, or associated skin lesions Alignment: Symmetrical Functional ROM: Unrestricted ROM      Stability: No instability detected Muscle Tone/Strength: Functionally intact. No obvious neuro-muscular anomalies detected. Sensory (Neurological): Unimpaired Palpation: No palpable anomalies              Upper Extremity (UE) Exam    Side: Right upper extremity  Side: Left upper extremity  Skin & Extremity Inspection: Skin color, temperature, and hair growth are WNL. No peripheral edema or cyanosis. No masses, redness, swelling, asymmetry, or associated skin lesions. No contractures.  Skin & Extremity Inspection: Skin color, temperature, and hair growth are WNL. No peripheral edema or cyanosis. No masses, redness, swelling, asymmetry, or associated skin lesions. No contractures.  Functional ROM: Unrestricted ROM          Functional ROM: Unrestricted ROM          Muscle Tone/Strength: Functionally intact. No obvious neuro-muscular anomalies detected.  Muscle Tone/Strength: Functionally intact. No obvious neuro-muscular anomalies detected.  Sensory (Neurological): Unimpaired          Sensory (Neurological): Unimpaired          Palpation: No palpable anomalies              Palpation: No palpable anomalies              Provocative Test(s):  Phalen's test: deferred  Tinel's test: deferred Apley's scratch test (touch opposite shoulder):  Action 1 (Across chest): deferred Action 2 (Overhead): deferred Action 3 (LB reach): deferred   Provocative Test(s):  Phalen's test: deferred Tinel's test: deferred Apley's scratch test (touch opposite shoulder):  Action 1 (Across chest):  deferred Action 2 (Overhead): deferred Action 3 (LB reach): deferred    Thoracic Spine Area Exam  Skin & Axial Inspection: No masses, redness, or swelling Alignment: Symmetrical Functional ROM: Unrestricted ROM Stability: No instability detected Muscle Tone/Strength: Functionally intact. No obvious neuro-muscular anomalies detected. Sensory (Neurological): Unimpaired Muscle strength & Tone: No palpable anomalies  Lumbar Spine Area Exam  Skin & Axial Inspection: No masses, redness, or swelling Alignment: Symmetrical Functional ROM: Decreased ROM       Stability: No instability detected Muscle Tone/Strength: Functionally intact. No obvious neuro-muscular anomalies detected. Sensory (Neurological): Movement-associated pain Palpation: Complains of area being tender to palpation       Provocative Tests: Hyperextension/rotation test: Positive       Lumbar quadrant test (Kemp's test): (+)       Lateral bending test: deferred today       Patrick's Maneuver: deferred today                   FABER test: deferred today                   S-I anterior distraction/compression test: deferred today         S-I lateral compression test: deferred today         S-I Thigh-thrust test: deferred today         S-I Gaenslen's test: deferred today          Gait & Posture Assessment  Ambulation: Unassisted Gait: Relatively normal for age and body habitus Posture: Antalgic   Lower Extremity Exam    Side: Right lower extremity  Side: Left lower extremity  Stability: No instability observed          Stability: No instability observed          Skin & Extremity Inspection: Skin color, temperature, and hair growth are WNL. No peripheral edema or cyanosis. No masses, redness, swelling, asymmetry, or associated skin lesions. No contractures.  Skin & Extremity Inspection: Skin color, temperature, and hair growth are WNL. No peripheral edema or cyanosis. No masses, redness, swelling, asymmetry, or associated  skin lesions. No contractures.  Functional ROM: Unrestricted ROM                  Functional ROM: Unrestricted ROM                  Muscle Tone/Strength: Functionally intact. No obvious neuro-muscular anomalies detected.  Muscle Tone/Strength: Functionally intact. No obvious neuro-muscular anomalies detected.  Sensory (Neurological): Unimpaired  Sensory (Neurological): Unimpaired  Palpation: No palpable anomalies  Palpation: No palpable anomalies   Assessment  Primary Diagnosis & Pertinent Problem List: The primary encounter diagnosis was Chronic thoracic back pain (Bilateral). Diagnoses of Thoracic facet syndrome (Bilateral), Lumbar facet joint syndrome (Bilateral), Neurogenic pain, and Spondylosis without myelopathy or radiculopathy, thoracolumbar region were also pertinent to this visit.  Status Diagnosis  Recurring Recurring Recurring 1. Chronic thoracic back pain (Bilateral)   2. Thoracic facet syndrome (Bilateral)   3. Lumbar facet joint syndrome (Bilateral)   4. Neurogenic pain   5. Spondylosis without myelopathy or radiculopathy, thoracolumbar region     Problems updated and reviewed during this visit: No problems updated. Plan of Care  Pharmacotherapy (Medications Ordered): Meds ordered this encounter  Medications  . gabapentin (NEURONTIN) 100 MG capsule    Sig: Take 1-3 capsules (100-300 mg total) by mouth 4 (four) times daily. Follow the written titration schedule.    Dispense:  360 capsule    Refill:  2    Do not place this medication, or any other prescription from our practice, on "Automatic Refill". Patient may have prescription filled one day early if pharmacy is closed on scheduled refill date.   Medications administered today: Nalah Macioce had no medications administered during this visit.   Procedure Orders     Radiofrequency,Lumbar Lab Orders  No laboratory test(s) ordered today   Imaging Orders  No imaging studies ordered today   Referral Orders   No referral(s) requested today   Interventional therapies: Planned, scheduled, and/or pending:   Schedule to repeat left T12, L1, L2, & L3 Medial Branch RFA. Last done in 2018.   Considering:   Palliative bilateral T12, L1, L2, & L3 Thoracolumbar Facet MBB #4    Palliative PRN treatment(s):   Palliative LeftT12, L1, L2, &L3Thoracolumbar Facet RFA (Last done on 06/30/16) Palliative RightT12, L1, L2, &L3Thoracolumbar Facet RFA  (Last done on 09/27/16)   Provider-requested follow-up: Return for RFA (fluoro + sedation): (L) T12, L1, L2, & L3 Medial Branch RFA.  Future Appointments  Date Time Provider [place]  03/14/2018  9:00 AM Vevelyn Francois, NP [facility] None   Primary Care Physician: Center, [place] Location: [facility] Outpatient Pain Management Facility Note by: Gaspar Cola, MD Date: 01/18/2018; Time: 9:32 AM

## 2018-01-18 ENCOUNTER — Other Ambulatory Visit: Payer: Self-pay

## 2018-01-18 ENCOUNTER — Encounter: Payer: Self-pay | Admitting: Pain Medicine

## 2018-01-18 ENCOUNTER — Ambulatory Visit: Payer: BLUE CROSS/BLUE SHIELD | Attending: Pain Medicine | Admitting: Pain Medicine

## 2018-01-18 VITALS — BP 159/70 | HR 86 | Temp 98.0°F | Ht 61.0 in | Wt 107.0 lb

## 2018-01-18 DIAGNOSIS — M792 Neuralgia and neuritis, unspecified: Secondary | ICD-10-CM

## 2018-01-18 DIAGNOSIS — M47815 Spondylosis without myelopathy or radiculopathy, thoracolumbar region: Secondary | ICD-10-CM | POA: Diagnosis not present

## 2018-01-18 DIAGNOSIS — G894 Chronic pain syndrome: Secondary | ICD-10-CM | POA: Insufficient documentation

## 2018-01-18 DIAGNOSIS — M47816 Spondylosis without myelopathy or radiculopathy, lumbar region: Secondary | ICD-10-CM | POA: Diagnosis not present

## 2018-01-18 DIAGNOSIS — F1290 Cannabis use, unspecified, uncomplicated: Secondary | ICD-10-CM | POA: Insufficient documentation

## 2018-01-18 DIAGNOSIS — M47894 Other spondylosis, thoracic region: Secondary | ICD-10-CM

## 2018-01-18 DIAGNOSIS — Z79891 Long term (current) use of opiate analgesic: Secondary | ICD-10-CM | POA: Insufficient documentation

## 2018-01-18 DIAGNOSIS — Z79899 Other long term (current) drug therapy: Secondary | ICD-10-CM | POA: Diagnosis not present

## 2018-01-18 DIAGNOSIS — Z885 Allergy status to narcotic agent status: Secondary | ICD-10-CM | POA: Diagnosis not present

## 2018-01-18 DIAGNOSIS — Z5181 Encounter for therapeutic drug level monitoring: Secondary | ICD-10-CM | POA: Diagnosis not present

## 2018-01-18 DIAGNOSIS — I10 Essential (primary) hypertension: Secondary | ICD-10-CM | POA: Diagnosis not present

## 2018-01-18 DIAGNOSIS — F17210 Nicotine dependence, cigarettes, uncomplicated: Secondary | ICD-10-CM | POA: Insufficient documentation

## 2018-01-18 DIAGNOSIS — M47814 Spondylosis without myelopathy or radiculopathy, thoracic region: Secondary | ICD-10-CM | POA: Diagnosis not present

## 2018-01-18 DIAGNOSIS — Z7982 Long term (current) use of aspirin: Secondary | ICD-10-CM | POA: Diagnosis not present

## 2018-01-18 DIAGNOSIS — G8929 Other chronic pain: Secondary | ICD-10-CM

## 2018-01-18 DIAGNOSIS — M546 Pain in thoracic spine: Secondary | ICD-10-CM

## 2018-01-18 MED ORDER — GABAPENTIN 100 MG PO CAPS: 100.0000 mg | ORAL_CAPSULE | Freq: Four times a day (QID) | ORAL | 2 refills | Status: DC

## 2018-01-18 NOTE — Patient Instructions (Addendum)
____________________________________________________________________________________________  CANNABIDIOL (AKA: CBD Oil or Pills)  Applies to: All patients receiving prescriptions of controlled substances (written and/or electronic).  General Information: Cannabidiol (CBD) was discovered in 100. It is one of some 113 identified cannabinoids in cannabis (Marijuana) plants, accounting for up to 40% of the plant's extract. As of 2018, preliminary clinical research on cannabidiol included studies of anxiety, cognition, movement disorders, and pain.  Cannabidiol is consummed in multiple ways, including inhalation of cannabis smoke or vapor, as an aerosol spray into the cheek, and by mouth. It may be supplied as CBD oil containing CBD as the active ingredient (no added tetrahydrocannabinol (THC) or terpenes), a full-plant CBD-dominant hemp extract oil, capsules, dried cannabis, or as a liquid solution. CBD is thought not have the same psychoactivity as THC, and may affect the actions of THC. Studies suggest that CBD may interact with different biological targets, including cannabinoid receptors and other neurotransmitter receptors. As of 2018 the mechanism of action for its biological effects has not been determined.  In the [place], cannabidiol has a limited approval by the Food and Drug Administration (FDA) for treatment of only two types of epilepsy disorders. The side effects of long-term use of the drug include somnolence, decreased appetite, diarrhea, fatigue, malaise, weakness, sleeping problems, and others.  CBD remains a Schedule I drug prohibited for any use.  Legality: Some manufacturers ship CBD products nationally, an illegal action which the FDA has not enforced in 2018, with CBD remaining the subject of an FDA investigational new drug evaluation, and is not considered legal as a dietary supplement or food ingredient as of December 2018. Federal illegality has made it difficult  historically to conduct research on CBD. CBD is openly sold in head shops and health food stores in some states where such sales have not been explicitly legalized.  Warning: Because it is not FDA approved for general use or treatment of pain, it is not required to undergo the same manufacturing controls as prescription drugs.  This means that the available cannabidiol (CBD) may be contaminated with THC.  If this is the case, it will trigger a positive urine drug screen (UDS) test for cannabinoids (Marijuana).  Because a positive UDS for illicit substances is a violation of our medication agreement, your opioid analgesics (pain medicine) may be permanently discontinued. (Last update: 08/18/2017) ____________________________________________________________________________________________   ____________________________________________________________________________________________  Gabapentin Titration  Medication used: Gabapentin (Generic Name) or Neurontin (Brand Name) 100 mg tablets/capsules  Reasons to stop increasing the dose:  Reason 1: You get good relief of symptoms, in which case there is no need to increase the daily dose any further.    Reason 2: You develop some side effects, such as sleeping all of the time, difficulty concentrating, or becoming disoriented, in which case you need to go down on the dose, to the prior level, where you were not experiencing any side effects. Stay on that dose longer, to allow more time for your body to get use it, before attempting to increase it again.   Steps to increase medication: Step 1: Start by taking 1 (one) tablet at bedtime x 7 (seven) days.  Step 2: Increase dose to 2 (two) tablets at bedtime. Stay on this dose x 7 (seven) days.  Step 3: Next increase it to 3 (three) tablets at bedtime. Stay on this dose x another 7 (seven) days.  Step 4: Next, add 1 (one) tablet at noon with lunch. Continue this dose x another 7 (seven) days.  Step  5: Add 1 (one) tablet in the afternoon with dinner. Stay on this dose x another 7 (seven) days.  Step 6: At this point you should be taking the medicine 4 (four) times a day. This daily regimen of taking the medicine 4 (four) times a day, will be maintained from now on. You should not take any doses any sooner than every 6 (six) hours.  Step 7: After 7 (seven) days of taking 3 (three) tablet at bedtime, 1 (one) tablet at noon, 1 (one) tablet in the afternoon, and 1 (one) tablet in the morning, begin taking 2 (two) tablets at noon with lunch. Stay on this dose x another 7 (seven) days.   Step 8: After 7 (seven) days of taking 3 (three) tablet at bedtime, 2 (two) tablets at noon, 1 (one) tablet in the afternoon, and 1 (one) tablet in the morning, begin taking 2 (two) tablets in the afternoon with dinner. Stay on this dose x another 7 (seven) days.   Step 9: After 7 (seven) days of taking 3 (three) tablet at bedtime, 2 (two) tablets at noon, 2 (two) tablets in the afternoon, and 1 (one) tablet in the morning, begin taking 2 (two) tablets in the morning with breakfast. Stay on this dose x another 7 (seven) days. At this point you should be taking the medicine 4 (four) times a day, or about every 6 (six) hours. This daily regimen of taking the medicine 4 (four) times a day, will be maintained from now on. You should not take any doses any sooner than every 6 (six) hours.  Step 10: After 7 (seven) days of taking 3 (three) tablet at bedtime, 2 (two) tablets at noon, 2 (two) tablets in the afternoon, and 2 (two) tablets in the morning, begin taking 3 (three) tablets at noon with lunch. Stay on this dose x another 7 (seven) days.   Step 11: After 7 (seven) days of taking 3 (three) tablet at bedtime, 3 (three) tablets at noon, 2 (two) tablets in the afternoon, and 2 (two) tablets in the morning, begin taking 3 (three) tablets in the afternoon with dinner. Stay on this dose x another 7 (seven) days.   Step 12:  After 7 (seven) days of taking 3 (three) tablet at bedtime, 3 (three) tablets at noon, 3 (three) tablets in the afternoon, and 2 (two) tablet in the morning, begin taking 3 (three) tablets in the morning with breakfast. Stay on this dose x another 7 (seven) days. At this point you should be taking the medicine 4 (four) times a day, or about every 6 (six) hours. This daily regimen of taking the medicine 4 (four) times a day, will be maintained from now on.   Endpoint: Once you have reached the maximum dose you can tolerate without side-effects, contact your physician so as to evaluate the results of the regimen.   Questions: Feel free to contact us for any questions or problems at [phone] ____________________________________________________________________________________________  ____________________________________________________________________________________________  Preparing for Procedure with Sedation  Instructions: . Oral Intake: Do not eat or drink anything for at least 8 hours prior to your procedure. . Transportation: Public transportation is not allowed. Bring an adult driver. The driver must be physically present in our waiting room before any procedure can be started. Marland Kitchen Physical Assistance: Bring an adult physically capable of assisting you, in the event you need help. This adult should keep you company at home for at least 6 hours after the procedure. . Blood Pressure  Medicine: Take your blood pressure medicine with a sip of water the morning of the procedure. . Blood thinners: Notify our staff if you are taking any blood thinners. Depending on which one you take, there will be specific instructions on how and when to stop it. . Diabetics on insulin: Notify the staff so that you can be scheduled 1st case in the morning. If your diabetes requires high dose insulin, take only  of your normal insulin dose the morning of the procedure and notify the staff that you have done  so. . Preventing infections: Shower with an antibacterial soap the morning of your procedure. . Build-up your immune system: Take 1000 mg of Vitamin C with every meal (3 times a day) the day prior to your procedure. Marland Kitchen Antibiotics: Inform the staff if you have a condition or reason that requires you to take antibiotics before dental procedures. . Pregnancy: If you are pregnant, call and cancel the procedure. . Sickness: If you have a cold, fever, or any active infections, call and cancel the procedure. . Arrival: You must be in the facility at least 30 minutes prior to your scheduled procedure. . Children: Do not bring children with you. . Dress appropriately: Bring dark clothing that you would not mind if they get stained. . Valuables: Do not bring any jewelry or valuables.  Procedure appointments are reserved for interventional treatments only. Marland Kitchen No Prescription Refills. . No medication changes will be discussed during procedure appointments. . No disability issues will be discussed.  Reasons to call and reschedule or cancel your procedure: (Following these recommendations will minimize the risk of a serious complication.) . Surgeries: Avoid having procedures within 2 weeks of any surgery. (Avoid for 2 weeks before or after any surgery). . Flu Shots: Avoid having procedures within 2 weeks of a flu shots or . (Avoid for 2 weeks before or after immunizations). . Barium: Avoid having a procedure within 7-10 days after having had a radiological study involving the use of radiological contrast. (Myelograms, Barium swallow or enema study). . Heart attacks: Avoid any elective procedures or surgeries for the initial 6 months after a "Myocardial Infarction" (Heart Attack). . Blood thinners: It is imperative that you stop these medications before procedures. Let us know if you if you take any blood thinner.  . Infection: Avoid procedures during or within two weeks of an infection (including chest colds  or gastrointestinal problems). Symptoms associated with infections include: Localized redness, fever, chills, night sweats or profuse sweating, burning sensation when voiding, cough, congestion, stuffiness, runny nose, sore throat, diarrhea, nausea, vomiting, cold or Flu symptoms, recent or current infections. It is specially important if the infection is over the area that we intend to treat. Marland Kitchen Heart and lung problems: Symptoms that may suggest an active cardiopulmonary problem include: cough, chest pain, breathing difficulties or shortness of breath, dizziness, ankle swelling, uncontrolled high or unusually low blood pressure, and/or palpitations. If you are experiencing any of these symptoms, cancel your procedure and contact your primary care physician for an evaluation.  Remember:  Regular Business hours are:  Monday to Thursday 8:00 AM to 4:00 PM  Provider's Schedule: Milinda Pointer, MD:  Procedure days: Tuesday and Thursday 7:30 AM to 4:00 PM  Gillis Santa, MD:  Procedure days: Monday and Wednesday 7:30 AM to 4:00 PM ____________________________________________________________________________________________

## 2018-03-07 ENCOUNTER — Ambulatory Visit: Payer: BLUE CROSS/BLUE SHIELD | Admitting: Pain Medicine

## 2018-03-14 ENCOUNTER — Ambulatory Visit: Payer: BLUE CROSS/BLUE SHIELD | Attending: Nurse Practitioner | Admitting: Nurse Practitioner

## 2018-03-14 ENCOUNTER — Other Ambulatory Visit: Payer: Self-pay

## 2018-03-14 ENCOUNTER — Encounter: Payer: Self-pay | Admitting: Nurse Practitioner

## 2018-03-14 VITALS — BP 148/80 | HR 90 | Temp 97.3°F | Resp 16 | Ht 61.0 in | Wt 107.0 lb

## 2018-03-14 DIAGNOSIS — I10 Essential (primary) hypertension: Secondary | ICD-10-CM | POA: Diagnosis not present

## 2018-03-14 DIAGNOSIS — Z8249 Family history of ischemic heart disease and other diseases of the circulatory system: Secondary | ICD-10-CM | POA: Diagnosis not present

## 2018-03-14 DIAGNOSIS — F1290 Cannabis use, unspecified, uncomplicated: Secondary | ICD-10-CM | POA: Diagnosis not present

## 2018-03-14 DIAGNOSIS — M47814 Spondylosis without myelopathy or radiculopathy, thoracic region: Secondary | ICD-10-CM | POA: Diagnosis not present

## 2018-03-14 DIAGNOSIS — N179 Acute kidney failure, unspecified: Secondary | ICD-10-CM | POA: Insufficient documentation

## 2018-03-14 DIAGNOSIS — F17210 Nicotine dependence, cigarettes, uncomplicated: Secondary | ICD-10-CM | POA: Diagnosis not present

## 2018-03-14 DIAGNOSIS — Z79899 Other long term (current) drug therapy: Secondary | ICD-10-CM | POA: Insufficient documentation

## 2018-03-14 DIAGNOSIS — M792 Neuralgia and neuritis, unspecified: Secondary | ICD-10-CM

## 2018-03-14 DIAGNOSIS — M7918 Myalgia, other site: Secondary | ICD-10-CM | POA: Insufficient documentation

## 2018-03-14 DIAGNOSIS — M47816 Spondylosis without myelopathy or radiculopathy, lumbar region: Secondary | ICD-10-CM | POA: Insufficient documentation

## 2018-03-14 DIAGNOSIS — G894 Chronic pain syndrome: Secondary | ICD-10-CM | POA: Insufficient documentation

## 2018-03-14 DIAGNOSIS — Z79891 Long term (current) use of opiate analgesic: Secondary | ICD-10-CM | POA: Diagnosis not present

## 2018-03-14 DIAGNOSIS — M546 Pain in thoracic spine: Secondary | ICD-10-CM | POA: Diagnosis present

## 2018-03-14 MED ORDER — GABAPENTIN 300 MG PO CAPS: 300.0000 mg | ORAL_CAPSULE | Freq: Three times a day (TID) | ORAL | 2 refills | Status: DC

## 2018-03-14 MED ORDER — CYCLOBENZAPRINE HCL 10 MG PO TABS: 10.0000 mg | ORAL_TABLET | Freq: Three times a day (TID) | ORAL | 2 refills | Status: DC | PRN

## 2018-03-14 MED ORDER — ORPHENADRINE CITRATE 30 MG/ML IJ SOLN
INTRAMUSCULAR | Status: AC
  Filled 2018-03-14: qty 2

## 2018-03-14 MED ORDER — KETOROLAC TROMETHAMINE 60 MG/2ML IM SOLN
INTRAMUSCULAR | Status: AC
  Filled 2018-03-14: qty 2

## 2018-03-14 MED ORDER — ORPHENADRINE CITRATE 30 MG/ML IJ SOLN
60.0000 mg | Freq: Once | INTRAMUSCULAR | Status: AC
  Administered 2018-03-14: 60 mg via INTRAMUSCULAR

## 2018-03-14 MED ORDER — KETOROLAC TROMETHAMINE 60 MG/2ML IM SOLN
60.0000 mg | Freq: Once | INTRAMUSCULAR | Status: AC
  Administered 2018-03-14: 60 mg via INTRAMUSCULAR

## 2018-03-14 NOTE — Progress Notes (Signed)
Safety precautions to be maintained throughout the outpatient stay will include: orient to surroundings, keep bed in low position, maintain call bell within reach at all times, provide assistance with transfer out of bed and ambulation.  

## 2018-03-14 NOTE — Progress Notes (Signed)
Patient's Name: Deanna Jacobson  MRN: [id number]  Referring Provider: [facility]  DOB: 06/08/1955  PCP: Donnie Coffin, MD  DOS: 03/14/2018  Note by: Vevelyn Francois NP  Service setting: Ambulatory outpatient  Specialty: Interventional Pain Management  Location: [facility] (AMB) Pain Management Facility    Patient type: Established    Primary Reason(s) for Visit: Encounter for prescription drug management. (Level of risk: moderate)  CC: Back Pain (mid)  HPI  Deanna Jacobson is a 62 y.o. year old, adult patient, who comes today for a medication management evaluation. She has Acute renal failure (ARF) ([place]); Long term current use of opiate analgesic; Long term prescription opiate use; Opiate use; Encounter for therapeutic drug level monitoring; Encounter for pain management planning; Chronic thoracic spine pain (midline); Thoracic facet syndrome (Bilateral); Lumbar facet arthropathy ([place]); Musculoskeletal pain; Neurogenic pain; Osteoarthritis; Chronic pain syndrome; Thoracic spondylosis; Marijuana use; Lumbar facet joint syndrome (Bilateral); Lumbar spondylosis; Spondylosis without myelopathy or radiculopathy, thoracolumbar region; and Chronic thoracic back pain (Bilateral) on their problem list. Her primarily concern today is the Back Pain (mid)  Pain Assessment: Location: Mid Back Radiating: denies Onset: More than a month ago Duration: Chronic pain Quality: Aching, Burning, Constant, Discomfort Severity: 5 /10 (subjective, self-reported pain score)  Note: Reported level is compatible with observation.                          Effect on ADL: "I stil do what I need to" Timing: Constant Modifying factors: medications BP: (!) 148/80  HR: 90  Deanna Jacobson was last scheduled for an appointment on 12/13/2017 for medication management. During today's appointment we reviewed Deanna Jacobson's chronic pain status, as well as her outpatient medication regimen.  She admits that her back pain  is persistent.  She does feel like the gabapentin does help with that.  She admits that she continues to use over-the-counter Goody powders.  She is wondering if Aleve muscle relief will be damaging to her stomach.  She admits that when she is working and has to stand for long periods that makes her back hurt more.  She is not interested in any interventional therapy at this time.  She admits that the lumbar radiofrequency ablations are effective however the 1 month of pain after the procedure is not something she is ready to reexperience.  The patient  reports that she has current or past drug history. Drug: Marijuana. Her body mass index is 20.22 kg/m.  Further details on both, my assessment(s), as well as the proposed treatment plan, please see below.  Laboratory Chemistry  Inflammation Markers (CRP: Acute Phase) (ESR: Chronic Phase) Lab Results  Component Value Date   CRP 0.4 12/23/2016   ESRSEDRATE 7 12/23/2016                         Rheumatology Markers No results found for: RF, ANA, LABURIC, URICUR, LYMEIGGIGMAB, LYMEABIGMQN, HLAB27                      Renal Function Markers Lab Results  Component Value Date   BUN 7 (L) 12/13/2017   CREATININE 0.75 12/13/2017   BCR 9 (L) 12/13/2017   GFRAA 98 12/13/2017   GFRNONAA 85 12/13/2017                             Hepatic Function Markers  Lab Results  Component Value Date   AST 18 12/13/2017   ALT 12 (L) 02/02/2017   ALBUMIN 4.1 12/13/2017   ALKPHOS 89 12/13/2017                        Electrolytes Lab Results  Component Value Date   NA 135 12/13/2017   K 5.4 (H) 12/13/2017   CL 97 12/13/2017   CALCIUM 9.3 12/13/2017   MG 1.9 12/23/2016                        Neuropathy Markers Lab Results  Component Value Date   EEFEOFHQ19 758 12/23/2016                        CNS Tests No results found for: COLORCSF, APPEARCSF, RBCCOUNTCSF, WBCCSF, POLYSCSF, LYMPHSCSF, EOSCSF, PROTEINCSF, GLUCCSF, JCVIRUS, CSFOLI, IGGCSF                       Bone Pathology Markers Lab Results  Component Value Date   25OHVITD1 49 12/23/2016   25OHVITD2 3.2 12/23/2016   25OHVITD3 46 12/23/2016                         Coagulation Parameters Lab Results  Component Value Date   PLT 325 12/23/2016                        Cardiovascular Markers Lab Results  Component Value Date   TROPONINI <0.03 02/01/2016   HGB 12.8 12/23/2016   HCT 39.9 12/23/2016                         CA Markers No results found for: CEA, CA125, LABCA2                      Note: Lab results reviewed.  Recent Diagnostic Imaging Results  DG C-Arm 1-60 Min-No Report Fluoroscopy was utilized by the requesting physician.  No radiographic  interpretation.   Complexity Note: Imaging results reviewed. Results shared with Deanna Jacobson, using Layman's terms.                         Meds   Current Outpatient Medications:  .  amLODipine (NORVASC) 2.5 MG tablet, Take 2.5 mg by mouth., Disp: , Rfl:  .  Aspirin-Acetaminophen (GOODYS BODY PAIN PO), Take by mouth 2 (two) times daily as needed., Disp: , Rfl:  .  ibuprofen (ADVIL,MOTRIN) 800 MG tablet, TAKE 1 TABLET (800 MG TOTAL) BY MOUTH 3 (THREE) TIMES DAILY AS NEEDED FOR PAIN WITH FOOD, Disp: , Rfl: 1 .  lisinopril (PRINIVIL,ZESTRIL) 20 MG tablet, Take 1 tablet (20 mg total) by mouth daily., Disp: 30 tablet, Rfl: 2 .  Misc Natural Products (T-RELIEF CBD+13) SUBL, Place under the tongue., Disp: , Rfl:  .  traZODone (DESYREL) 100 MG tablet, Take 100 mg by mouth at bedtime. , Disp: , Rfl: 4 .  cyclobenzaprine (FLEXERIL) 10 MG tablet, Take 1 tablet (10 mg total) by mouth every 8 (eight) hours as needed for muscle spasms., Disp: 90 tablet, Rfl: 2 .  gabapentin (NEURONTIN) 300 MG capsule, Take 1 capsule (300 mg total) by mouth every 8 (eight) hours., Disp: 90 capsule, Rfl: 2  ROS  Constitutional: Denies any fever or chills Gastrointestinal:  No reported hemesis, hematochezia, vomiting, or acute GI  distress Musculoskeletal: Denies any acute onset joint swelling, redness, loss of ROM, or weakness Neurological: No reported episodes of acute onset apraxia, aphasia, dysarthria, agnosia, amnesia, paralysis, loss of coordination, or loss of consciousness  Allergies  Deanna Jacobson is allergic to codeine.  [place]  Drug: Deanna Jacobson  reports that she has current or past drug history. Drug: Marijuana. Alcohol:  reports that she drinks alcohol. Tobacco:  reports that she has been smoking cigarettes. She has been smoking about 1.00 pack per day. She has never used smokeless tobacco. Medical:  has a past medical history of Chronic back pain and Hypertension. Surgical: Deanna Jacobson  has a past surgical history that includes Breast enhancement surgery. Family: family history includes Cancer in her mother; Hypertension in her father.  Constitutional Exam  General appearance: Well nourished, well developed, and well hydrated. In no apparent acute distress Vitals:   03/14/18 0858  BP: (!) 148/80  Pulse: 90  Resp: 16  Temp: (!) 97.3 F (36.3 C)  SpO2: 100%  Weight: 107 lb (48.5 kg)  Height: '5\' 1"'  (1.549 m)   BMI Assessment: Estimated body mass index is 20.22 kg/m as calculated from the following:   Height as of this encounter: '5\' 1"'  (1.549 m).   Weight as of this encounter: 107 lb (48.5 kg). Psych/Mental status: Alert, oriented x 3 (person, place, & time)       Eyes: PERLA Respiratory: No evidence of acute respiratory distress  Thoracic Spine Area Exam  Skin & Axial Inspection: No masses, redness, or swelling Alignment: Symmetrical Functional ROM: Unrestricted ROM Stability: No instability detected Muscle Tone/Strength: Functionally intact. No obvious neuro-muscular anomalies detected. Sensory (Neurological): Unimpaired Muscle strength & Tone: No palpable anomalies  Lumbar Spine Area Exam  Skin & Axial Inspection: No masses, redness, or swelling Alignment:  Symmetrical Functional ROM: Unrestricted ROM       Stability: No instability detected Muscle Tone/Strength: Functionally intact. No obvious neuro-muscular anomalies detected. Sensory (Neurological): Unimpaired Palpation: Tender         Gait & Posture Assessment  Ambulation: Unassisted Gait: Relatively normal for age and body habitus Posture: WNL   Lower Extremity Exam    Side: Right lower extremity  Side: Left lower extremity  Stability: No instability observed          Stability: No instability observed          Skin & Extremity Inspection: Skin color, temperature, and hair growth are WNL. No peripheral edema or cyanosis. No masses, redness, swelling, asymmetry, or associated skin lesions. No contractures.  Skin & Extremity Inspection: Skin color, temperature, and hair growth are WNL. No peripheral edema or cyanosis. No masses, redness, swelling, asymmetry, or associated skin lesions. No contractures.  Functional ROM: Unrestricted ROM                  Functional ROM: Unrestricted ROM                  Muscle Tone/Strength: Functionally intact. No obvious neuro-muscular anomalies detected.  Muscle Tone/Strength: Functionally intact. No obvious neuro-muscular anomalies detected.  Sensory (Neurological): Unimpaired  Sensory (Neurological): Unimpaired  Palpation: No palpable anomalies  Palpation: No palpable anomalies   Assessment  Primary Diagnosis & Pertinent Problem List: The primary encounter diagnosis was Lumbar spondylosis. Diagnoses of Thoracic spondylosis, Lumbar facet arthropathy ([place]), Musculoskeletal pain, Chronic pain syndrome, and Neurogenic pain were also pertinent to this visit.  Status Diagnosis  Persistent Controlled Persistent  1. Lumbar spondylosis   2. Thoracic spondylosis   3. Lumbar facet arthropathy ([place])   4. Musculoskeletal pain   5. Chronic pain syndrome   6. Neurogenic pain     Problems updated and reviewed during this visit: No problems updated. Plan of  Care  Pharmacotherapy (Medications Ordered): Meds ordered this encounter  Medications  . orphenadrine (NORFLEX) injection 60 mg  . ketorolac (TORADOL) injection 60 mg  . cyclobenzaprine (FLEXERIL) 10 MG tablet    Sig: Take 1 tablet (10 mg total) by mouth every 8 (eight) hours as needed for muscle spasms.    Dispense:  90 tablet    Refill:  2    Do not add this medication to the electronic "Automatic Refill" notification system. Patient may have prescription filled one day early if pharmacy is closed on scheduled refill date.    Order Specific Question:   Supervising Provider    Answer:   Milinda Pointer [phone]  . gabapentin (NEURONTIN) 300 MG capsule    Sig: Take 1 capsule (300 mg total) by mouth every 8 (eight) hours.    Dispense:  90 capsule    Refill:  2    Do not place this medication, or any other prescription from our practice, on "Automatic Refill". Patient may have prescription filled one day early if pharmacy is closed on scheduled refill date.    Order Specific Question:   Supervising Provider    Answer:   Milinda Pointer [phone]   New Prescriptions   GABAPENTIN (NEURONTIN) 300 MG CAPSULE    Take 1 capsule (300 mg total) by mouth every 8 (eight) hours.   Medications administered today: We administered orphenadrine and ketorolac. Lab-work, procedure(s), and/or referral(s): No orders of the defined types were placed in this encounter.  Imaging and/or referral(s): None  Interventional therapies: Planned, scheduled, and/or pending:  Palliative Bilateral T12, L1, L2, &L3Medial Branch Level(s)Block. She is having midline pain and would like both sides treated and then is willing to repeat the RFA   Considering:  Palliative LeftT12, L1, L2, &L3Medial Branch Level(s)RFA (Last done on 06/30/16)   Palliative PRN treatment(s):  PalliativeT12, L1, L2, &L3Medial Branch Level(s)Block PalliativeT12, L1, L2, &L3Medial Branch Level(s)RFA     Provider-requested follow-up: Return in about 3 months (around 06/14/2018) for MedMgmt.  Future Appointments  Date Time Provider [place]  06/13/2018  9:30 AM Vevelyn Francois, NP [facility] None  09/12/2018  9:30 AM Vevelyn Francois, NP [facility] None   Primary Care Physician: Donnie Coffin, MD Location: [facility] Note by: Vevelyn Francois NP Date: 03/14/2018; Time: 11:17 AM  Pain Score Disclaimer: We use the NRS-11 scale. This is a self-reported, subjective measurement of pain severity with only modest accuracy. It is used primarily to identify changes within a particular patient. It must be understood that outpatient pain scales are significantly less accurate that those used for research, where they can be applied under ideal controlled circumstances with minimal exposure to variables. In reality, the score is likely to be a combination of pain intensity and pain affect, where pain affect describes the degree of emotional arousal or changes in action readiness caused by the sensory experience of pain. Factors such as social and work situation, setting, emotional state, anxiety levels, expectation, and prior pain experience may influence pain perception and show large inter-individual differences that may also be affected by time variables.  Patient instructions provided during this appointment: There are no Patient Instructions on file for this  visit.

## 2018-06-13 ENCOUNTER — Other Ambulatory Visit: Payer: Self-pay

## 2018-06-13 ENCOUNTER — Ambulatory Visit: Payer: BLUE CROSS/BLUE SHIELD | Attending: Nurse Practitioner | Admitting: Nurse Practitioner

## 2018-06-13 VITALS — BP 143/74 | HR 85 | Temp 97.5°F | Resp 16 | Ht 61.0 in | Wt 107.0 lb

## 2018-06-13 DIAGNOSIS — M899 Disorder of bone, unspecified: Secondary | ICD-10-CM | POA: Insufficient documentation

## 2018-06-13 DIAGNOSIS — M47814 Spondylosis without myelopathy or radiculopathy, thoracic region: Secondary | ICD-10-CM | POA: Diagnosis present

## 2018-06-13 DIAGNOSIS — M7918 Myalgia, other site: Secondary | ICD-10-CM | POA: Diagnosis not present

## 2018-06-13 DIAGNOSIS — M47816 Spondylosis without myelopathy or radiculopathy, lumbar region: Secondary | ICD-10-CM

## 2018-06-13 DIAGNOSIS — Z789 Other specified health status: Secondary | ICD-10-CM | POA: Diagnosis present

## 2018-06-13 DIAGNOSIS — Z79899 Other long term (current) drug therapy: Secondary | ICD-10-CM | POA: Diagnosis present

## 2018-06-13 DIAGNOSIS — G894 Chronic pain syndrome: Secondary | ICD-10-CM | POA: Diagnosis not present

## 2018-06-13 MED ORDER — CYCLOBENZAPRINE HCL 10 MG PO TABS: 10.0000 mg | ORAL_TABLET | Freq: Three times a day (TID) | ORAL | 2 refills | Status: DC | PRN

## 2018-06-13 MED ORDER — ORPHENADRINE CITRATE 30 MG/ML IJ SOLN
60.0000 mg | Freq: Once | INTRAMUSCULAR | Status: AC
  Administered 2018-06-13: 60 mg via INTRAMUSCULAR
  Filled 2018-06-13: qty 2

## 2018-06-13 MED ORDER — GABAPENTIN 300 MG PO CAPS: 300.0000 mg | ORAL_CAPSULE | Freq: Three times a day (TID) | ORAL | 2 refills | Status: DC

## 2018-06-13 MED ORDER — KETOROLAC TROMETHAMINE 60 MG/2ML IM SOLN
60.0000 mg | Freq: Once | INTRAMUSCULAR | Status: AC
  Administered 2018-06-13: 60 mg via INTRAMUSCULAR
  Filled 2018-06-13: qty 2

## 2018-06-13 NOTE — Progress Notes (Signed)
Safety precautions to be maintained throughout the outpatient stay will include: orient to surroundings, keep bed in low position, maintain call bell within reach at all times, provide assistance with transfer out of bed and ambulation.  

## 2018-06-13 NOTE — Patient Instructions (Signed)
____________________________________________________________________________________________  Medication Rules  Purpose: To inform patients, and their family members, of our rules and regulations.  Applies to: All patients receiving prescriptions (written or electronic).  Pharmacy of record: Pharmacy where electronic prescriptions will be sent. If written prescriptions are taken to a different pharmacy, please inform the nursing staff. The pharmacy listed in the electronic medical record should be the one where you would like electronic prescriptions to be sent.  Electronic prescriptions: In compliance with the [place]reng[place] Opioid Misuse Prevention (STOP) Act of 2017 (Session Law 2017-74/H243), effective May 31, 2018, all controlled substances must be electronically prescribed. Calling prescriptions to the pharmacy will cease to exist.  Prescription refills: Only during scheduled appointments. Applies to all prescriptions.  NOTE: The following applies primarily to controlled substances (Opioid* Pain Medications).   Patient's responsibilities: 1. Pain Pills: Bring all pain pills to every appointment (except for procedure appointments). 2. Pill Bottles: Bring pills in original pharmacy bottle. Always bring the newest bottle. Bring bottle, even if empty. 3. Medication refills: You are responsible for knowing and keeping track of what medications you take and those you need refilled. The day before your appointment: write a list of all prescriptions that need to be refilled. The day of the appointment: give the list to the admitting nurse. Prescriptions will be written only during appointments. If you forget a medication: it will not be "Called in", "Faxed", or "electronically sent". You will need to get another appointment to get these prescribed. No early refills. Do not call asking to have your prescription filled early. 4. Prescription Accuracy: You are responsible for  carefully inspecting your prescriptions before leaving our office. Have the discharge nurse carefully go over each prescription with you, before taking them home. Make sure that your name is accurately spelled, that your address is correct. Check the name and dose of your medication to make sure it is accurate. Check the number of pills, and the written instructions to make sure they are clear and accurate. Make sure that you are given enough medication to last until your next medication refill appointment. 5. Taking Medication: Take medication as prescribed. When it comes to controlled substances, taking less pills or less frequently than prescribed is permitted and encouraged. Never take more pills than instructed. Never take medication more frequently than prescribed.  6. Inform other Doctors: Always inform, all of your healthcare providers, of all the medications you take. 7. Pain Medication from other Providers: You are not allowed to accept any additional pain medication from any other Doctor or Healthcare provider. There are two exceptions to this rule. (see below) In the event that you require additional pain medication, you are responsible for notifying us, as stated below. 8. Medication Agreement: You are responsible for carefully reading and following our Medication Agreement. This must be signed before receiving any prescriptions from our practice. Safely store a copy of your signed Agreement. Violations to the Agreement will result in no further prescriptions. (Additional copies of our Medication Agreement are available upon request.) 9. Laws, Rules, & Regulations: All patients are expected to follow all Federal and State Laws, Statutes, Rules, & Regulations. Ignorance of the Laws does not constitute a valid excuse. The use of any illegal substances is prohibited. 10. Adopted CDC guidelines & recommendations: Target dosing levels will be at or below 60 MME/day. Use of benzodiazepines** is not  recommended.  Exceptions: There are only two exceptions to the rule of not receiving pain medications from other Healthcare Providers. 1.   Exception #1 (Emergencies): In the event of an emergency (i.e.: accident requiring emergency care), you are allowed to receive additional pain medication. However, you are responsible for: As soon as you are able, call our office [phone], at any time of the day or night, and leave a message stating your name, the date and nature of the emergency, and the name and dose of the medication prescribed. In the event that your call is answered by a member of our staff, make sure to document and save the date, time, and the name of the person that took your information.  2. Exception #2 (Planned Surgery): In the event that you are scheduled by another doctor or dentist to have any type of surgery or procedure, you are allowed (for a period no longer than 30 days), to receive additional pain medication, for the acute post-op pain. However, in this case, you are responsible for picking up a copy of our "Post-op Pain Management for Surgeons" handout, and giving it to your surgeon or dentist. This document is available at our office, and does not require an appointment to obtain it. Simply go to our office during business hours (Monday-Thursday from 8:00 AM to 4:00 PM) (Friday 8:00 AM to 12:00 Noon) or if you have a scheduled appointment with us, prior to your surgery, and ask for it by name. In addition, you will need to provide us with your name, name of your surgeon, type of surgery, and date of procedure or surgery.  *Opioid medications include: morphine, codeine, oxycodone, oxymorphone, hydrocodone, hydromorphone, meperidine, tramadol, tapentadol, buprenorphine, fentanyl, methadone. **Benzodiazepine medications include: diazepam (Valium), alprazolam (Xanax), clonazepam (Klonopine), lorazepam (Ativan), clorazepate (Tranxene), chlordiazepoxide (Librium), estazolam (Prosom),  oxazepam (Serax), temazepam (Restoril), triazolam (Halcion) (Last updated: 07/28/2017) ____________________________________________________________________________________________    

## 2018-06-13 NOTE — Progress Notes (Signed)
Patient's Name: Deanna Jacobson  MRN: [id number]  Referring Provider: Donnie Coffin, MD  DOB: 1955/07/08  PCP: Donnie Coffin, MD  DOS: 06/13/2018  Note by: Vevelyn Francois NP  Service setting: Ambulatory outpatient  Specialty: Interventional Pain Management  Location: [facility] (AMB) Pain Management Facility    Patient type: Established    Primary Reason(s) for Visit: Encounter for prescription drug management. (Level of risk: moderate)  CC: Back Pain (mid)  HPI  Deanna Jacobson is a 63 y.o. year old, adult patient, who comes today for a medication management evaluation. He has Acute renal failure (ARF) ([place]); Long term current use of opiate analgesic; Long term prescription opiate use; Opiate use; Encounter for therapeutic drug level monitoring; Encounter for pain management planning; Chronic thoracic spine pain (midline); Thoracic facet syndrome (Bilateral); Lumbar facet arthropathy ([place]); Musculoskeletal pain; Neurogenic pain; Osteoarthritis; Chronic pain syndrome; Thoracic spondylosis; Marijuana use; Lumbar facet joint syndrome (Bilateral); Lumbar spondylosis; Spondylosis without myelopathy or radiculopathy, thoracolumbar region; Chronic thoracic back pain (Bilateral); Pharmacologic therapy; Disorder of skeletal system; and Problems influencing health status on their problem list. His primarily concern today is the Back Pain (mid)  Pain Assessment: Location: Mid Back Radiating: denies Onset: More than a month ago Duration: Chronic pain Quality: Aching, Constant, Burning Severity: 2 /10 (subjective, self-reported pain score)  Note: Reported level is compatible with observation.                          Effect on ADL: sleep, sitting Timing: Constant Modifying factors: MJ, medications BP: (!) 143/74  HR: 85  Deanna Jacobson was last scheduled for an appointment on 03/14/2018 for medication management. During today's appointment we reviewed Deanna Jacobson's chronic pain status, as well as his  outpatient medication regimen.  She admits that her pain is worse with change in weather.  She denies any new concerns.  She denies any side effects of her current medication.  She does continue to work full-time.  The patient  reports current drug use. Drug: Marijuana. His body mass index is 20.22 kg/m.  Further details on both, my assessment(s), as well as the proposed treatment plan, please see below.  Laboratory Chemistry  Inflammation Markers (CRP: Acute Phase) (ESR: Chronic Phase) Lab Results  Component Value Date   CRP 0.4 12/23/2016   ESRSEDRATE 7 12/23/2016                         Rheumatology Markers No results found for: RF, ANA, LABURIC, URICUR, LYMEIGGIGMAB, LYMEABIGMQN, HLAB27                      Renal Function Markers Lab Results  Component Value Date   BUN 7 (L) 12/13/2017   CREATININE 0.75 12/13/2017   BCR 9 (L) 12/13/2017   GFRAA 98 12/13/2017   GFRNONAA 85 12/13/2017                             Hepatic Function Markers Lab Results  Component Value Date   AST 18 12/13/2017   ALT 12 (L) 02/02/2017   ALBUMIN 4.1 12/13/2017   ALKPHOS 89 12/13/2017                        Electrolytes Lab Results  Component Value Date   NA 135 12/13/2017   K 5.4 (H) 12/13/2017  CL 97 12/13/2017   CALCIUM 9.3 12/13/2017   MG 1.9 12/23/2016                        Neuropathy Markers Lab Results  Component Value Date   WPYKDXIP38 250 12/23/2016                        CNS Tests No results found for: COLORCSF, APPEARCSF, RBCCOUNTCSF, WBCCSF, POLYSCSF, LYMPHSCSF, EOSCSF, PROTEINCSF, GLUCCSF, JCVIRUS, CSFOLI, IGGCSF                      Bone Pathology Markers Lab Results  Component Value Date   25OHVITD1 49 12/23/2016   25OHVITD2 3.2 12/23/2016   25OHVITD3 46 12/23/2016                         Coagulation Parameters Lab Results  Component Value Date   PLT 325 12/23/2016                        Cardiovascular Markers Lab Results  Component Value Date    TROPONINI <0.03 02/01/2016   HGB 12.8 12/23/2016   HCT 39.9 12/23/2016                         CA Markers No results found for: CEA, CA125, LABCA2                      Note: Lab results reviewed.  Labs updated today.  Recent Diagnostic Imaging Results  DG C-Arm 1-60 Min-No Report Fluoroscopy was utilized by the requesting physician.  No radiographic  interpretation.   Complexity Note: Imaging results reviewed. Results shared with Deanna Jacobson, using Layman's terms.                         Meds   Current Outpatient Medications:  .  amLODipine (NORVASC) 2.5 MG tablet, Take 2.5 mg by mouth., Disp: , Rfl:  .  Aspirin-Acetaminophen (GOODYS BODY PAIN PO), Take by mouth 2 (two) times daily as needed., Disp: , Rfl:  .  Aspirin-Salicylamide-Caffeine (BC HEADACHE POWDER PO), Take by mouth., Disp: , Rfl:  .  lisinopril (PRINIVIL,ZESTRIL) 20 MG tablet, Take 1 tablet (20 mg total) by mouth daily., Disp: 30 tablet, Rfl: 2 .  Misc Natural Products (T-RELIEF CBD+13) SUBL, Place under the tongue., Disp: , Rfl:  .  naproxen sodium (ALEVE) 220 MG tablet, Take 220 mg by mouth., Disp: , Rfl:  .  traZODone (DESYREL) 100 MG tablet, Take 100 mg by mouth at bedtime. , Disp: , Rfl: 4 .  cyclobenzaprine (FLEXERIL) 10 MG tablet, Take 1 tablet (10 mg total) by mouth every 8 (eight) hours as needed for muscle spasms., Disp: 90 tablet, Rfl: 2 .  gabapentin (NEURONTIN) 300 MG capsule, Take 1 capsule (300 mg total) by mouth every 8 (eight) hours., Disp: 90 capsule, Rfl: 2  Current Facility-Administered Medications:  .  ketorolac (TORADOL) injection 60 mg, 60 mg, Intramuscular, Once, [place], [employer], NP .  orphenadrine (NORFLEX) injection 60 mg, 60 mg, Intramuscular, Once, Edison Pace, [employer], NP  ROS  Constitutional: Denies any fever or chills Gastrointestinal: No reported hemesis, hematochezia, vomiting, or acute GI distress Musculoskeletal: Denies any acute onset joint swelling, redness, loss of ROM, or  weakness Neurological: No reported episodes of acute  onset apraxia, aphasia, dysarthria, agnosia, amnesia, paralysis, loss of coordination, or loss of consciousness  Allergies  Deanna Jacobson is allergic to codeine.  [place]  Drug: Deanna Jacobson  reports current drug use. Drug: Marijuana. Alcohol:  reports current alcohol use. Tobacco:  reports that he has been smoking cigarettes. He has been smoking about 1.00 pack per day. He has never used smokeless tobacco. Medical:  has a past medical history of Chronic back pain and Hypertension. Surgical: Deanna Jacobson  has a past surgical history that includes Breast enhancement surgery. Family: family history includes Cancer in his mother; Hypertension in his father.  Constitutional Exam  General appearance: Well nourished, well developed, and well hydrated. In no apparent acute distress Vitals:   06/13/18 0857  BP: (!) 143/74  Pulse: 85  Resp: 16  Temp: (!) 97.5 F (36.4 C)  SpO2: 100%  Weight: 107 lb (48.5 kg)  Height: _0  (1.549 m)  Psych/Mental status: Alert, oriented x 3 (person, place, & time)       Eyes: PERLA Respiratory: No evidence of acute respiratory distress  Lumbar Spine Area Exam  Skin & Axial Inspection: No masses, redness, or swelling Alignment: Symmetrical Functional ROM: Unrestricted ROM       Stability: No instability detected Muscle Tone/Strength: Functionally intact. No obvious neuro-muscular anomalies detected. Sensory (Neurological): Unimpaired Palpation: Tender        Gait & Posture Assessment  Ambulation: Unassisted Gait: Relatively normal for age and body habitus Posture: WNL   Lower Extremity Exam    Side: Right lower extremity  Side: Left lower extremity  Stability: No instability observed          Stability: No instability observed          Skin & Extremity Inspection: Skin color, temperature, and hair growth are WNL. No peripheral edema or cyanosis. No masses, redness, swelling, asymmetry, or  associated skin lesions. No contractures.  Skin & Extremity Inspection: Skin color, temperature, and hair growth are WNL. No peripheral edema or cyanosis. No masses, redness, swelling, asymmetry, or associated skin lesions. No contractures.  Functional ROM: Unrestricted ROM                  Functional ROM: Unrestricted ROM                  Muscle Tone/Strength: Functionally intact. No obvious neuro-muscular anomalies detected.  Muscle Tone/Strength: Functionally intact. No obvious neuro-muscular anomalies detected.  Sensory (Neurological): Unimpaired        Sensory (Neurological): Unimpaired        Palpation: No palpable anomalies  Palpation: No palpable anomalies   Assessment  Primary Diagnosis & Pertinent Problem List: The primary encounter diagnosis was Lumbar spondylosis. Diagnoses of Thoracic spondylosis, Chronic pain syndrome, Musculoskeletal pain, Pharmacologic therapy, Disorder of skeletal system, and Problems influencing health status were also pertinent to this visit.  Status Diagnosis  Having a Flare-up Controlled Controlled 1. Lumbar spondylosis   2. Thoracic spondylosis   3. Chronic pain syndrome   4. Musculoskeletal pain   5. Pharmacologic therapy   6. Disorder of skeletal system   7. Problems influencing health status     Problems updated and reviewed during this visit: Problem  Pharmacologic Therapy  Disorder of Skeletal System  Problems Influencing Health Status   Plan of Care  Pharmacotherapy (Medications Ordered): Meds ordered this encounter  Medications  . cyclobenzaprine (FLEXERIL) 10 MG tablet    Sig: Take 1 tablet (10 mg total) by mouth every 8 (  eight) hours as needed for muscle spasms.    Dispense:  90 tablet    Refill:  2    Do not add this medication to the electronic "Automatic Refill" notification system. Patient may have prescription filled one day early if pharmacy is closed on scheduled refill date.    Order Specific Question:   Supervising  Provider    Answer:   Milinda Pointer [phone]  . gabapentin (NEURONTIN) 300 MG capsule    Sig: Take 1 capsule (300 mg total) by mouth every 8 (eight) hours.    Dispense:  90 capsule    Refill:  2    Do not place this medication, or any other prescription from our practice, on "Automatic Refill". Patient may have prescription filled one day early if pharmacy is closed on scheduled refill date.    Order Specific Question:   Supervising Provider    Answer:   Milinda Pointer [phone]  . orphenadrine (NORFLEX) injection 60 mg  . ketorolac (TORADOL) injection 60 mg   New Prescriptions   No medications on file   Medications administered today: Minaal Norkus had no medications administered during this visit. Lab-work, procedure(s), and/or referral(s): Orders Placed This Encounter  Procedures  . Comp. Metabolic Panel (12)  . Magnesium  . Vitamin B12  . Sedimentation rate  . 25-Hydroxyvitamin D Lcms D2+D3  . C-reactive protein   Imaging and/or referral(s): None  Interventional therapies: Planned, scheduled, and/or pending:  Palliative BilateralT12, L1, L2, &L3Medial Branch Level(s)Block. She is having midline pain and would like both sides treated and then is willing to repeat the RFA   Considering:  Palliative LeftT12, L1, L2, &L3Medial Branch Level(s)RFA(Last done on 06/30/16)   Palliative PRN treatment(s):  PalliativeT12, L1, L2, &L3Medial Branch Level(s)Block PalliativeT12, L1, L2, &L3Medial Branch Level(s)RFA    Provider-requested follow-up: Return for Appointment As Scheduled.  Future Appointments  Date Time Provider [place]  09/12/2018  9:30 AM Vevelyn Francois, NP [facility] None   Primary Care Physician: Donnie Coffin, MD Location: [facility] Outpatient Pain Management Facility Note by: Vevelyn Francois NP Date: 06/13/2018; Time: 10:01 AM  Pain Score Disclaimer: We use the NRS-11 scale. This is a self-reported, subjective  measurement of pain severity with only modest accuracy. It is used primarily to identify changes within a particular patient. It must be understood that outpatient pain scales are significantly less accurate that those used for research, where they can be applied under ideal controlled circumstances with minimal exposure to variables. In reality, the score is likely to be a combination of pain intensity and pain affect, where pain affect describes the degree of emotional arousal or changes in action readiness caused by the sensory experience of pain. Factors such as social and work situation, setting, emotional state, anxiety levels, expectation, and prior pain experience may influence pain perception and show large inter-individual differences that may also be affected by time variables.  Patient instructions provided during this appointment: Patient Instructions  ____________________________________________________________________________________________  Medication Rules  Purpose: To inform patients, and their family members, of our rules and regulations.  Applies to: All patients receiving prescriptions (written or electronic).  Pharmacy of record: Pharmacy where electronic prescriptions will be sent. If written prescriptions are taken to a different pharmacy, please inform the nursing staff. The pharmacy listed in the electronic medical record should be the one where you would like electronic prescriptions to be sent.  Electronic prescriptions: In compliance with the [place] (STOP) Act of 2017 (  Session Law [phone]), effective May 31, 2018, all controlled substances must be electronically prescribed. Calling prescriptions to the pharmacy will cease to exist.  Prescription refills: Only during scheduled appointments. Applies to all prescriptions.  NOTE: The following applies primarily to controlled substances (Opioid* Pain Medications).    Patient's responsibilities: 1. Pain Pills: Bring all pain pills to every appointment (except for procedure appointments). 2. Pill Bottles: Bring pills in original pharmacy bottle. Always bring the newest bottle. Bring bottle, even if empty. 3. Medication refills: You are responsible for knowing and keeping track of what medications you take and those you need refilled. The day before your appointment: write a list of all prescriptions that need to be refilled. The day of the appointment: give the list to the admitting nurse. Prescriptions will be written only during appointments. If you forget a medication: it will not be "Called in", "Faxed", or "electronically sent". You will need to get another appointment to get these prescribed. No early refills. Do not call asking to have your prescription filled early. 4. Prescription Accuracy: You are responsible for carefully inspecting your prescriptions before leaving our office. Have the discharge nurse carefully go over each prescription with you, before taking them home. Make sure that your name is accurately spelled, that your address is correct. Check the name and dose of your medication to make sure it is accurate. Check the number of pills, and the written instructions to make sure they are clear and accurate. Make sure that you are given enough medication to last until your next medication refill appointment. 5. Taking Medication: Take medication as prescribed. When it comes to controlled substances, taking less pills or less frequently than prescribed is permitted and encouraged. Never take more pills than instructed. Never take medication more frequently than prescribed.  6. Inform other Doctors: Always inform, all of your healthcare providers, of all the medications you take. 7. Pain Medication from other Providers: You are not allowed to accept any additional pain medication from any other Doctor or Healthcare provider. There are two  exceptions to this rule. (see below) In the event that you require additional pain medication, you are responsible for notifying us, as stated below. 8. Medication Agreement: You are responsible for carefully reading and following our Medication Agreement. This must be signed before receiving any prescriptions from our practice. Safely store a copy of your signed Agreement. Violations to the Agreement will result in no further prescriptions. (Additional copies of our Medication Agreement are available upon request.) 9. Laws, Rules, & Regulations: All patients are expected to follow all Federal and [employer], Rules, [employer]. Ignorance of the Laws does not constitute a valid excuse. The use of any illegal substances is prohibited. 10. Adopted CDC guidelines & recommendations: Target dosing levels will be at or below 60 MME/day. Use of benzodiazepines** is not recommended.  Exceptions: There are only two exceptions to the rule of not receiving pain medications from other Healthcare Providers. 1. Exception #1 (Emergencies): In the event of an emergency (i.e.: accident requiring emergency care), you are allowed to receive additional pain medication. However, you are responsible for: As soon as you are able, call our office [phone], at any time of the day or night, and leave a message stating your name, the date and nature of the emergency, and the name and dose of the medication prescribed. In the event that your call is answered by a member of our staff, make sure to document and save the date,  time, and the name of the person that took your information.  2. Exception #2 (Planned Surgery): In the event that you are scheduled by another doctor or dentist to have any type of surgery or procedure, you are allowed (for a period no longer than 30 days), to receive additional pain medication, for the acute post-op pain. However, in this case, you are responsible for picking up a copy of our  "Post-op Pain Management for Surgeons" handout, and giving it to your surgeon or dentist. This document is available at our office, and does not require an appointment to obtain it. Simply go to our office during business hours (Monday-Thursday from 8:00 AM to 4:00 PM) (Friday 8:00 AM to 12:00 Noon) or if you have a scheduled appointment with [place], prior to your surgery, and ask for it by name. In addition, you will need to provide [place] with your name, name of your surgeon, type of surgery, and date of procedure or surgery.  *Opioid medications include: morphine, codeine, oxycodone, oxymorphone, hydrocodone, hydromorphone, meperidine, tramadol, tapentadol, buprenorphine, fentanyl, methadone. **Benzodiazepine medications include: diazepam (Valium), alprazolam (Xanax), clonazepam (Klonopine), lorazepam (Ativan), clorazepate (Tranxene), chlordiazepoxide (Librium), estazolam (Prosom), oxazepam (Serax), temazepam (Restoril), triazolam (Halcion) (Last updated: 07/28/2017) ____________________________________________________________________________________________

## 2018-06-18 LAB — 25-HYDROXY VITAMIN D LCMS D2+D3
25-Hydroxy, Vitamin D-3: 45 ng/mL
25-Hydroxy, Vitamin D: 45 ng/mL

## 2018-06-18 LAB — COMP. METABOLIC PANEL (12)
ALBUMIN: 3.9 g/dL (ref 3.6–4.8)
AST: 12 IU/L (ref 0–40)
Albumin/Globulin Ratio: 1.7 (ref 1.2–2.2)
Alkaline Phosphatase: 83 IU/L (ref 39–117)
BUN/Creatinine Ratio: 10 — ABNORMAL LOW (ref 12–28)
BUN: 10 mg/dL (ref 8–27)
Bilirubin Total: <0.2 mg/dL (ref 0.0–1.2)
CALCIUM: 9.2 mg/dL (ref 8.7–10.3)
CHLORIDE: 98 mmol/L (ref 96–106)
Creatinine, Ser: 0.98 mg/dL (ref 0.57–1.00)
GFR calc Af Amer: 71 mL/min/{1.73_m2} (ref >59)
GFR calc non Af Amer: 61 mL/min/{1.73_m2} (ref >59)
GLOBULIN, TOTAL: 2.3 g/dL (ref 1.5–4.5)
GLUCOSE: 66 mg/dL (ref 65–99)
POTASSIUM: 4.2 mmol/L (ref 3.5–5.2)
Sodium: 136 mmol/L (ref 134–144)
Total Protein: 6.2 g/dL (ref 6.0–8.5)

## 2018-06-18 LAB — MAGNESIUM: MAGNESIUM: 1.9 mg/dL (ref 1.6–2.3)

## 2018-06-18 LAB — VITAMIN B12: Vitamin B-12: 537 pg/mL (ref 232–1245)

## 2018-06-18 LAB — C-REACTIVE PROTEIN: CRP: <1 mg/L (ref 0–10)

## 2018-06-18 LAB — 25-HYDROXYVITAMIN D LCMS D2+D3

## 2018-06-18 LAB — SEDIMENTATION RATE: Sed Rate: 13 mm/hr (ref 0–40)

## 2018-07-11 ENCOUNTER — Telehealth: Payer: Self-pay | Admitting: Nurse Practitioner

## 2018-07-11 NOTE — Telephone Encounter (Signed)
Pt states that she needs a refill of gabapentin. She uses [facility] in [place]

## 2018-07-11 NOTE — Telephone Encounter (Signed)
Called patient and informed her that it was sent to her pharm on Jan 14 with 2 refill. Can not be refilled until 07/13/2018 and to call her pharm. She stated that she received a text stating she needed a refill . Instructed patient to call her pharm. Patient with understanding.

## 2018-09-04 ENCOUNTER — Other Ambulatory Visit: Payer: Self-pay | Admitting: Nurse Practitioner

## 2018-09-12 ENCOUNTER — Other Ambulatory Visit: Payer: Self-pay

## 2018-09-12 ENCOUNTER — Ambulatory Visit: Payer: BLUE CROSS/BLUE SHIELD | Attending: Nurse Practitioner | Admitting: Nurse Practitioner

## 2018-09-12 DIAGNOSIS — G8929 Other chronic pain: Secondary | ICD-10-CM

## 2018-09-12 DIAGNOSIS — M899 Disorder of bone, unspecified: Secondary | ICD-10-CM | POA: Diagnosis not present

## 2018-09-12 DIAGNOSIS — M7918 Myalgia, other site: Secondary | ICD-10-CM | POA: Diagnosis not present

## 2018-09-12 DIAGNOSIS — M546 Pain in thoracic spine: Secondary | ICD-10-CM | POA: Diagnosis not present

## 2018-09-12 DIAGNOSIS — G894 Chronic pain syndrome: Secondary | ICD-10-CM

## 2018-09-12 DIAGNOSIS — M47816 Spondylosis without myelopathy or radiculopathy, lumbar region: Secondary | ICD-10-CM

## 2018-09-12 MED ORDER — CYCLOBENZAPRINE HCL 10 MG PO TABS: 10.0000 mg | ORAL_TABLET | Freq: Three times a day (TID) | ORAL | 2 refills | Status: DC | PRN

## 2018-09-12 MED ORDER — GABAPENTIN 300 MG PO CAPS: 300.0000 mg | ORAL_CAPSULE | Freq: Three times a day (TID) | ORAL | 0 refills | Status: DC

## 2018-09-12 NOTE — Progress Notes (Signed)
Pain Management Encounter Note - Virtual Visit via Telephone Telehealth (real-time audio visits between healthcare provider and patient).  Patient's Phone No. & Preferred Pharmacy:  [phone] (home); There is no such number on file (mobile).; (Preferred) [phone]  [id number] - [place], [place] - [address] [place] [address] Phone: [phone] Fax: [phone]   Pre-screening note:  Our staff contacted Deanna Jacobson and offered him an "in person", "face-to-face" appointment versus a telephone encounter. He indicated preferring the telephone encounter, at this time.  Reason for Virtual Visit: COVID-19*  Social distancing based on CDC and AMA recommendations.   I contacted Deanna Jacobson on 09/12/2018 at 11:03 AM by telephone and clearly identified myself as Dionisio David, NP. I verified that I was speaking with the correct person using two identifiers (Name and date of birth: 1956-04-07).  Advanced Informed Consent I sought verbal advanced consent from Deanna Jacobson for telemedicine interactions and virtual visit. I informed Deanna Jacobson of the security and privacy concerns, risks, and limitations associated with performing an evaluation and management service by telephone. I also informed Deanna Jacobson of the availability of "in person" appointments and I informed him of the possibility of a patient responsible charge related to this service. Deanna Jacobson expressed understanding and agreed to proceed.   Historic Elements   Deanna Jacobson is a 63 y.o. year old, adult patient evaluated today after his last encounter by our practice on 09/04/2018. Deanna Jacobson  has a past medical history of Chronic back pain and Hypertension. He also  has a past surgical history that includes Breast enhancement surgery. Deanna Jacobson has a current medication list which includes the following prescription(s): amlodipine, aspirin-acetaminophen,  aspirin-salicylamide-caffeine, cyclobenzaprine, gabapentin, lisinopril, t-relief cbd+13, naproxen sodium, and trazodone. He  reports that he has been smoking cigarettes. He has been smoking about 1.00 pack per day. He has never used smokeless tobacco. He reports current alcohol use. He reports current drug use. Drug: Marijuana. Deanna Jacobson is allergic to codeine.   HPI  I last saw him on 09/04/2018. He is being evaluated for medication management. She admits that she is not having pain today. She rates her pain 0/10. She has taken her medication. She has quit smoking and drinking at the same time. She was prescribed some medication by her PCP she admits that she did go through withdrawals. She admits that she was a nervous wreck. She admits that she never wants to go through this again. She admits that she is using the Flexeril and gabapentin. She has slacked off the [employer]. She has been out of since 08/09/18. She admits that she has stopped her Marijuana use.   Pharmacotherapy Assessment  Analgesic: none MME/day: 0 mg/day.   Monitoring: Pharmacotherapy: No side-effects or adverse reactions reported. [place] PMP: PDMP reviewed during this encounter.       Compliance: No problems identified. Plan: Refer to "POC".  Review of recent tests  DG C-Arm 1-60 Min-No Report Fluoroscopy was utilized by the requesting physician.  No radiographic  interpretation.    Clinical Support on 06/13/2018  Component Date Value Ref Range Status  . Glucose 06/13/2018 66  65 - 99 mg/dL Final  . BUN 06/13/2018 10  8 - 27 mg/dL Final  . Creatinine, Ser 06/13/2018 0.98  0.57 - 1.00 mg/dL Final  . GFR calc non Af Amer 06/13/2018 61  >59 mL/min/1.73 Final  . GFR calc Af Amer 06/13/2018 71  >59 mL/min/1.73 Final  . BUN/Creatinine Ratio  06/13/2018 10* 12 - 28 Final  . Sodium 06/13/2018 136  134 - 144 mmol/L Final  . Potassium 06/13/2018 4.2  3.5 - 5.2 mmol/L Final  . Chloride 06/13/2018 98  96 - 106 mmol/L Final  .  Calcium 06/13/2018 9.2  8.7 - 10.3 mg/dL Final  . Total Protein 06/13/2018 6.2  6.0 - 8.5 g/dL Final  . Albumin 06/13/2018 3.9  3.6 - 4.8 g/dL Final   Comment:     **Effective June 19, 2018 Albumin reference**       interval will be changing to:              Age                Female          Female           0 -  7 days        3.6 - 4.9      3.6 - 4.9           8 - 30 days        3.4 - 4.7      3.4 - 4.7           1 -  6 month       3.7 - 4.8      3.7 - 4.8    7 months -  2 years       3.9 - 5.0      3.9 - 5.0           3 -  5 years       4.0 - 5.0      4.0 - 5.0           6 - 12 years       4.1 - 5.0      4.0 - 5.0          13 - 30 years       4.1 - 5.2      3.9 - 5.0          31 - 50 years       4.0 - 5.0      3.8 - 4.8          51 - 60 years       3.8 - 4.9      3.8 - 4.9          61 - 70 years       3.8 - 4.8      3.8 - 4.8          71 - 80 years       3.7 - 4.7      3.7 - 4.7          81 - 89 years       3.6 - 4.6      3.6 - 4.6              >89 years       3.5 - 4.6      3.5 - 4.6   . Globulin, Total 06/13/2018 2.3  1.5 - 4.5 g/dL Final  . Albumin/Globulin Ratio 06/13/2018 1.7  1.2 - 2.2 Final  . Bilirubin Total 06/13/2018 <0.2  0.0 - 1.2 mg/dL Final  . Alkaline Phosphatase 06/13/2018 83  39 - 117 IU/L Final  . AST 06/13/2018 12  0 - 40 IU/L Final  . Magnesium 06/13/2018 1.9  1.6 - 2.3 mg/dL Final  . Vitamin B-12 06/13/2018 537  232 - 1,245 pg/mL Final  . Sed Rate 06/13/2018 13  0 - 40 mm/hr Final  . 25-Hydroxy, Vitamin D 06/13/2018 45  ng/mL Final   Comment: Reference Range: All Ages: Target levels 30 - 100   . 25-Hydroxy, Vitamin D-2 06/13/2018 <1.0  ng/mL Final  . 25-Hydroxy, Vitamin D-3 06/13/2018 45  ng/mL Final  . CRP 06/13/2018 <1  0 - 10 mg/L Final   Assessment  The primary encounter diagnosis was Lumbar spondylosis. Diagnoses of Musculoskeletal pain, Chronic thoracic back pain (Bilateral), Disorder of skeletal system, and Chronic pain syndrome were also pertinent  to this visit.  Plan of Care  I am having Deanna Jacobson maintain his traZODone, lisinopril, amLODipine, Aspirin-Acetaminophen (GOODYS BODY PAIN PO), T-Relief ZWC+58, Aspirin-Salicylamide-Caffeine (BC HEADACHE POWDER PO), naproxen sodium, cyclobenzaprine, and gabapentin.  Pharmacotherapy (Medications Ordered): Meds ordered this encounter  Medications  . cyclobenzaprine (FLEXERIL) 10 MG tablet    Sig: Take 1 tablet (10 mg total) by mouth every 8 (eight) hours as needed for muscle spasms.    Dispense:  90 tablet    Refill:  2    Do not add this medication to the electronic "Automatic Refill" notification system. Patient may have prescription filled one day early if pharmacy is closed on scheduled refill date.    Order Specific Question:   Supervising Provider    Answer:   Milinda Pointer [phone]  . gabapentin (NEURONTIN) 300 MG capsule    Sig: Take 1 capsule (300 mg total) by mouth every 8 (eight) hours.    Dispense:  270 capsule    Refill:  0    Do not place this medication, or any other prescription from our practice, on "Automatic Refill". Patient may have prescription filled one day early if pharmacy is closed on scheduled refill date.    Order Specific Question:   Supervising Provider    Answer:   Milinda Pointer [phone]   Orders:  No orders of the defined types were placed in this encounter.  Follow-up plan:   Return in about 3 months (around 12/12/2018) for MedMgmt.   I discussed the assessment and treatment plan with the patient. The patient was provided an opportunity to ask questions and all were answered. The patient agreed with the plan and demonstrated an understanding of the instructions.  Patient advised to call back or seek an in-person evaluation if the symptoms or condition worsens.  Total duration of non-face-to-face encounter: 18 minutes.  Note by: Dionisio David, NP  Disclaimer:  * Given the special circumstances of the COVID-19 pandemic, the federal  government has announced that the Office for Civil Rights (OCR) will exercise its enforcement discretion and will not impose penalties on physicians using telehealth in the event of noncompliance with regulatory requirements under the [place] and [place] (HIPAA) in connection with the good faith provision of telehealth during the RWERX-54 national public health emergency. ([place])

## 2018-10-05 ENCOUNTER — Ambulatory Visit: Payer: BLUE CROSS/BLUE SHIELD | Admitting: Psychiatry

## 2018-12-06 ENCOUNTER — Ambulatory Visit: Payer: BLUE CROSS/BLUE SHIELD | Admitting: Psychiatry

## 2018-12-06 ENCOUNTER — Other Ambulatory Visit: Payer: Self-pay

## 2018-12-07 ENCOUNTER — Encounter: Payer: Self-pay | Admitting: Pain Medicine

## 2018-12-07 NOTE — Progress Notes (Signed)
Pain Management Virtual Encounter Note - Virtual Visit via Telephone Telehealth (real-time audio visits between healthcare provider and patient).   Patient's Phone No. & Preferred Pharmacy:  [phone] (home); There is no such number on file (mobile).; (Preferred) [phone] No e-mail address on record  [id number] [place], [place] - [address] [place] [address] Phone: [phone] Fax: [phone]    Pre-screening note:  Our staff contacted Deanna Jacobson and offered him an "in person", "face-to-face" appointment versus a telephone encounter. He indicated preferring the telephone encounter, at this time.   Reason for Virtual Visit: COVID-19*  Social distancing based on CDC and AMA recommendations.   I contacted Hae Ahlers on 12/11/2018 via telephone.      I clearly identified myself as Gaspar Cola, MD. I verified that I was speaking with the correct person using two identifiers (Name: Deanna Jacobson, and date of birth: [birth year]).  Advanced Informed Consent I sought verbal advanced consent from Deanna Jacobson for virtual visit interactions. I informed Deanna Jacobson of possible security and privacy concerns, risks, and limitations associated with providing "not-in-person" medical evaluation and management services. I also informed Deanna Jacobson of the availability of "in-person" appointments. Finally, I informed him that there would be a charge for the virtual visit and that he could be  personally, fully or partially, financially responsible for it. Deanna Jacobson expressed understanding and agreed to proceed.   Historic Elements   Deanna Jacobson is a 63 y.o. year old, adult patient evaluated today after his last encounter by our practice on 09/12/2018. Deanna Jacobson  has a past medical history of Chronic back pain and Hypertension. He also  has a past surgical history that includes Breast enhancement surgery. Mr. Berland  has a current medication list which includes the following prescription(s): amlodipine, celecoxib, cyclobenzaprine, ferrous sulfate, folic acid, gabapentin, lisinopril, naproxen sodium, and trazodone. He  reports that he quit smoking about 4 months ago. His smoking use included cigarettes. He smoked 1.00 pack per day. He has never used smokeless tobacco. He reports current alcohol use. He reports current drug use. Drug: Marijuana. Deanna Jacobson is allergic to codeine.   HPI  Today, he is being contacted for medication management.  The patient has recently returned from [place] and she indicates that she still having the mid low back pain.  Previously we had done some diagnostic bilateral T12, L1, L2, & L3 medial branch facet blocks x 4.  The results showed 100% relief of the pain for the duration of the local anesthetic, but no long-term benefit.  This would suggest that we are dealing with a mechanical problem and in an effort to provide the patient with that longer lasting benefit, I have recommended to proceed with radiofrequency ablation.  Patient was informed of the plan and she has understood and agreed.  She indicates today having received a refill on her gabapentin from her primary care physician, we increased it to 600 mg p.o. 3 times daily.  I agree with this and will continue to increase it as tolerated.  Today I have also taken over her Celebrex and provided her with a muscle relaxant.  When I called the patient at home, she had just had an accident in her backyard where she lacerated the top of her left foot.  I stayed on the phone with her until she control the bleeding under running water in the tub.  Initially she was a little nervous, but she felt better  as soon as it stopped bleeding.  She indicated having experienced some cramping in her foot after that happened.  She also indicated that she recently had her tetanus shots and she had been told that it should last for 10 years so she indicates  that regarding this, she is covered.  I recommended that she clean the area well with soap and water and if she again began to bleed again, then she should go to the nearest urgent facility to have somebody take a look at this and perhaps evaluate if she needs any stitches.  She understood and agreed.  Pharmacotherapy Assessment  Analgesic: Tramadol 50 mg 1 every 6 hours (200 mg/day of tramadol) (Last written in 2018) (D/C'ed due to undisclosed THC on 02/02/2017 UDS) MME/day: 20 mg/day.   Monitoring: Pharmacotherapy: No side-effects or adverse reactions reported. [place] PMP: PDMP reviewed during this encounter.       Compliance: No problems identified. Effectiveness: Clinically acceptable. Plan: Refer to "POC".  Pertinent Labs   SAFETY SCREENING Profile No results found for: SARSCOV2NAA, COVIDSOURCE, STAPHAUREUS, MRSAPCR, HCVAB, HIV, PREGTESTUR Renal Function Lab Results  Component Value Date   BUN 10 06/13/2018   CREATININE 0.98 06/13/2018   BCR 10 (L) 06/13/2018   GFRAA 71 06/13/2018   GFRNONAA 61 06/13/2018   Hepatic Function Lab Results  Component Value Date   AST 12 06/13/2018   ALT 12 (L) 02/02/2017   ALBUMIN 3.9 06/13/2018   UDS Summary  Date Value Ref Range Status  02/02/2017 FINAL  Final    Comment:    ==================================================================== TOXASSURE SELECT 13 (MW) ==================================================================== Test                             Result       Flag       Units Drug Present and Declared for Prescription Verification   Tramadol                       >4000        EXPECTED   ng/mg creat   O-Desmethyltramadol            >4000        EXPECTED   ng/mg creat   N-Desmethyltramadol            421          EXPECTED   ng/mg creat    Source of tramadol is a prescription medication.    O-desmethyltramadol and N-desmethyltramadol are expected    metabolites of tramadol. Drug Present not Declared for Prescription  Verification   Carboxy-THC                    33           UNEXPECTED ng/mg creat    Carboxy-THC is a metabolite of tetrahydrocannabinol  (THC).    Source of [facility] is most commonly illicit, but THC is also present    in a scheduled prescription medication. ==================================================================== Test                      Result    Flag   Units      Ref Range   Creatinine              125              mg/dL      >=20 ==================================================================== Declared Medications:  The flagging and interpretation on this report are based on the  following declared medications.  Unexpected results may arise from  inaccuracies in the declared medications.  **Note: The testing scope of this panel includes these medications:  Tramadol  **Note: The testing scope of this panel does not include following  reported medications:  Cyclobenzaprine  Gabapentin  Lisinopril  Meloxicam  Trazodone ==================================================================== For clinical consultation, please call [phone]. ====================================================================    Note: Above Lab results reviewed.  Recent imaging  DG C-Arm 1-60 Min-No Report Fluoroscopy was utilized by the requesting physician.  No radiographic  interpretation.   Assessment  The primary encounter diagnosis was Chronic pain syndrome. Diagnoses of Chronic thoracic spine pain (Primary Area of Pain) (midline), Musculoskeletal pain, Neurogenic pain, Osteoarthritis involving multiple joints, Lumbar facet joint syndrome (Bilateral), and Thoracic facet syndrome (Bilateral) were also pertinent to this visit.  Plan of Care  I have discontinued Deanna Jacobson's Aspirin-Acetaminophen (GOODYS BODY PAIN PO), T-Relief UJW+11, Aspirin-Salicylamide-Caffeine (BC HEADACHE POWDER PO), and gabapentin. I have also changed his celecoxib and gabapentin. Additionally, I am  having him maintain his traZODone, lisinopril, amLODipine, naproxen sodium, ferrous sulfate, folic acid, and cyclobenzaprine.  Pharmacotherapy (Medications Ordered): Meds ordered this encounter  Medications  . cyclobenzaprine (FLEXERIL) 10 MG tablet    Sig: Take 1 tablet (10 mg total) by mouth every 8 (eight) hours as needed for muscle spasms.    Dispense:  90 tablet    Refill:  2    Fill one day early if pharmacy is closed on scheduled refill date. May substitute for generic if available.  . celecoxib (CELEBREX) 100 MG capsule    Sig: Take 1 capsule (100 mg total) by mouth 2 (two) times daily.    Dispense:  60 capsule    Refill:  5    Fill one day early if pharmacy is closed on scheduled refill date. May substitute for generic if available.  . gabapentin (NEURONTIN) 600 MG tablet    Sig: Take 1 tablet (600 mg total) by mouth 3 (three) times daily.    Dispense:  90 tablet    Refill:  4    Fill one day early if pharmacy is closed on scheduled refill date. May substitute for generic if available.   Orders:  Orders Placed This Encounter  Procedures  . Radiofrequency,Lumbar    Standing Status:   Future    Standing Expiration Date:   06/12/2020    Scheduling Instructions:     Side(s): Bilateral (starting with right-side and then moving on to left, two weeks later)     Level: T12, L1, L2, & L3 Medial Branch Nerves     Sedation: With Sedation     Scheduling Timeframe: As soon as pre-approved    Order Specific Question:   Where will this procedure be performed?    Answer:   [facility]   Follow-up plan:   Return in about 6 months (around 06/13/2019) for (VV), E/M (MM), in addition, RFA: (B) T12, L1, L2, & L3 Facet RFA #1 (w/ sedation).      Interventional therapies: Planned, scheduled, and/or pending: Schedule to repeat left T12, L1, L2, & L3 Medial Branch RFA. Last done in 2018.   Considering: Palliative bilateral T12, L1, L2, & L3 Thoracolumbar Facet MBB #4     Palliative PRN treatment(s): Palliative LeftT12, L1, L2, &L3Thoracolumbar FacetRFA(Last done on 06/30/16) PalliativeRightT12, L1, L2, &L3Thoracolumbar FacetRFA(Last done on 09/27/16)    Recent Visits Date Type Provider Dept  09/12/18  Office Visit Vevelyn Francois, NP [facility] recent visits within past 90 days and meeting all other requirements   Today's Visits Date Type Provider Dept  12/11/18 Office Visit Milinda Pointer, MD [facility]  Showing today's visits and meeting all other requirements   Future Appointments No visits were found meeting these conditions.  Showing future appointments within next 90 days and meeting all other requirements   I discussed the assessment and treatment plan with the patient. The patient was provided an opportunity to ask questions and all were answered. The patient agreed with the plan and demonstrated an understanding of the instructions.  Patient advised to call back or seek an in-person evaluation if the symptoms or condition worsens.  Total duration of non-face-to-face encounter: 23 minutes.  Note by: Gaspar Cola, MD Date: 12/11/2018; Time: 11:09 AM  Note: This dictation was prepared with Dragon dictation. Any transcriptional errors that may result from this process are unintentional.  Disclaimer:  * Given the special circumstances of the COVID-19 pandemic, the federal government has announced that the Office for Civil Rights (OCR) will exercise its enforcement discretion and will not impose penalties on physicians using telehealth in the event of noncompliance with regulatory requirements under the [place] and [place] (HIPAA) in connection with the good faith provision of telehealth during the IYMEB-58 national public health emergency. ([place])

## 2018-12-11 ENCOUNTER — Other Ambulatory Visit: Payer: Self-pay

## 2018-12-11 ENCOUNTER — Ambulatory Visit: Payer: BLUE CROSS/BLUE SHIELD | Attending: Nurse Practitioner | Admitting: Pain Medicine

## 2018-12-11 DIAGNOSIS — M150 Primary generalized (osteo)arthritis: Secondary | ICD-10-CM

## 2018-12-11 DIAGNOSIS — M47816 Spondylosis without myelopathy or radiculopathy, lumbar region: Secondary | ICD-10-CM

## 2018-12-11 DIAGNOSIS — M792 Neuralgia and neuritis, unspecified: Secondary | ICD-10-CM

## 2018-12-11 DIAGNOSIS — M546 Pain in thoracic spine: Secondary | ICD-10-CM

## 2018-12-11 DIAGNOSIS — M7918 Myalgia, other site: Secondary | ICD-10-CM | POA: Diagnosis not present

## 2018-12-11 DIAGNOSIS — G894 Chronic pain syndrome: Secondary | ICD-10-CM | POA: Diagnosis not present

## 2018-12-11 DIAGNOSIS — M159 Polyosteoarthritis, unspecified: Secondary | ICD-10-CM

## 2018-12-11 DIAGNOSIS — M47894 Other spondylosis, thoracic region: Secondary | ICD-10-CM

## 2018-12-11 DIAGNOSIS — G8929 Other chronic pain: Secondary | ICD-10-CM

## 2018-12-11 DIAGNOSIS — M47814 Spondylosis without myelopathy or radiculopathy, thoracic region: Secondary | ICD-10-CM

## 2018-12-11 MED ORDER — CELECOXIB 100 MG PO CAPS: 100.0000 mg | ORAL_CAPSULE | Freq: Two times a day (BID) | ORAL | 5 refills | Status: DC

## 2018-12-11 MED ORDER — CYCLOBENZAPRINE HCL 10 MG PO TABS: 10.0000 mg | ORAL_TABLET | Freq: Three times a day (TID) | ORAL | 2 refills | Status: DC | PRN

## 2018-12-11 MED ORDER — GABAPENTIN 600 MG PO TABS: 600.0000 mg | ORAL_TABLET | Freq: Three times a day (TID) | ORAL | 4 refills | Status: DC

## 2018-12-11 NOTE — Patient Instructions (Signed)

## 2018-12-19 ENCOUNTER — Encounter: Payer: Self-pay | Admitting: Family Medicine

## 2019-01-04 ENCOUNTER — Other Ambulatory Visit: Payer: Self-pay

## 2019-01-04 ENCOUNTER — Ambulatory Visit
Admission: RE | Admit: 2019-01-04 | Discharge: 2019-01-04 | Disposition: A | Discharge status: Home / Self Care | Payer: BLUE CROSS/BLUE SHIELD | Source: Ambulatory Visit | Attending: Pain Medicine | Admitting: Pain Medicine

## 2019-01-04 ENCOUNTER — Ambulatory Visit (HOSPITAL_BASED_OUTPATIENT_CLINIC_OR_DEPARTMENT_OTHER): Payer: BLUE CROSS/BLUE SHIELD | Admitting: Pain Medicine

## 2019-01-04 ENCOUNTER — Encounter: Payer: Self-pay | Admitting: Pain Medicine

## 2019-01-04 VITALS — BP 115/63 | HR 70 | Temp 97.8°F | Resp 16 | Ht 61.0 in | Wt 110.0 lb

## 2019-01-04 DIAGNOSIS — M47815 Spondylosis without myelopathy or radiculopathy, thoracolumbar region: Secondary | ICD-10-CM | POA: Diagnosis not present

## 2019-01-04 DIAGNOSIS — M47814 Spondylosis without myelopathy or radiculopathy, thoracic region: Secondary | ICD-10-CM | POA: Diagnosis present

## 2019-01-04 DIAGNOSIS — G8918 Other acute postprocedural pain: Secondary | ICD-10-CM | POA: Insufficient documentation

## 2019-01-04 DIAGNOSIS — M47817 Spondylosis without myelopathy or radiculopathy, lumbosacral region: Secondary | ICD-10-CM | POA: Diagnosis present

## 2019-01-04 DIAGNOSIS — M47816 Spondylosis without myelopathy or radiculopathy, lumbar region: Secondary | ICD-10-CM | POA: Diagnosis present

## 2019-01-04 DIAGNOSIS — G8929 Other chronic pain: Secondary | ICD-10-CM

## 2019-01-04 DIAGNOSIS — M546 Pain in thoracic spine: Secondary | ICD-10-CM

## 2019-01-04 DIAGNOSIS — M47894 Other spondylosis, thoracic region: Secondary | ICD-10-CM

## 2019-01-04 MED ORDER — DEXAMETHASONE SODIUM PHOSPHATE 10 MG/ML IJ SOLN
10.0000 mg | Freq: Once | INTRAMUSCULAR | Status: AC
  Administered 2019-01-04: 10 mg
  Filled 2019-01-04: qty 1

## 2019-01-04 MED ORDER — LACTATED RINGERS IV SOLN
1000.0000 mL | Freq: Once | INTRAVENOUS | Status: AC
  Administered 2019-01-04: 1000 mL via INTRAVENOUS

## 2019-01-04 MED ORDER — TRAMADOL HCL 50 MG PO TABS: 50.0000 mg | ORAL_TABLET | Freq: Four times a day (QID) | ORAL | 0 refills | Status: DC

## 2019-01-04 MED ORDER — TRIAMCINOLONE ACETONIDE 40 MG/ML IJ SUSP
40.0000 mg | Freq: Once | INTRAMUSCULAR | Status: AC
  Administered 2019-01-04: 40 mg
  Filled 2019-01-04: qty 1

## 2019-01-04 MED ORDER — ROPIVACAINE HCL 2 MG/ML IJ SOLN
9.0000 mL | Freq: Once | INTRAMUSCULAR | Status: AC
  Administered 2019-01-04: 9 mL via PERINEURAL
  Filled 2019-01-04: qty 10

## 2019-01-04 MED ORDER — MIDAZOLAM HCL 5 MG/5ML IJ SOLN
1.0000 mg | INTRAMUSCULAR | Status: DC | PRN
  Administered 2019-01-04: 11:00:00 1.5 mg via INTRAVENOUS
  Filled 2019-01-04: qty 5

## 2019-01-04 MED ORDER — LIDOCAINE HCL 2 % IJ SOLN
20.0000 mL | Freq: Once | INTRAMUSCULAR | Status: AC
  Administered 2019-01-04: 400 mg
  Filled 2019-01-04: qty 20

## 2019-01-04 MED ORDER — FENTANYL CITRATE (PF) 100 MCG/2ML IJ SOLN
25.0000 ug | INTRAMUSCULAR | Status: DC | PRN
  Administered 2019-01-04: 11:00:00 50 ug via INTRAVENOUS
  Filled 2019-01-04: qty 2

## 2019-01-04 NOTE — Progress Notes (Signed)
Patient's Name: Deanna Jacobson  MRN: [id number]  Referring Provider: Milinda Pointer, MD  DOB: 1955/06/18  PCP: Donnie Coffin, MD  DOS: 01/04/2019  Note by: Gaspar Cola, MD  Service setting: Ambulatory outpatient  Specialty: Interventional Pain Management  Patient type: Established  Location: [facility] (AMB) Pain Management Facility  Visit type: Interventional Procedure   Primary Reason for Visit: Interventional Pain Management Treatment. CC: Back Pain  Procedure:          Anesthesia, Analgesia, Anxiolysis:  Type: Thermal Lumbar Facet, Medial Branch Radiofrequency Ablation/Neurotomy  #2  Primary Purpose: Therapeutic Region: Posterolateral Lumbosacral Spine Level: T12, L1, L2, & L3 Medial Branch Level(s). Laterality: Left  Type: Moderate (Conscious) Sedation combined with Local Anesthesia Indication(s): Analgesia and Anxiety Route: Intravenous (IV) IV Access: Secured Sedation: Meaningful verbal contact was maintained at all times during the procedure  Local Anesthetic: Lidocaine 1-2%  Position: Prone   Indications: 1. Lumbar facet joint syndrome (Bilateral)   2. Spondylosis without myelopathy or radiculopathy, lumbosacral region   3. Spondylosis without myelopathy or radiculopathy, thoracolumbar region   4. Thoracic facet syndrome (Bilateral)   5. Chronic thoracic spine pain (Primary Area of Pain) (midline)    [name] has been dealing with the above chronic pain for longer than three months and has either failed to respond, was unable to tolerate, or simply did not get enough benefit from other more conservative therapies including, but not limited to: 1. Over-the-counter medications 2. Anti-inflammatory medications 3. Muscle relaxants 4. Membrane stabilizers 5. Opioids 6. Physical therapy and/or chiropractic manipulation 7. Modalities (Heat, ice, etc.) 8. Invasive techniques such as nerve blocks. [name] has attained more than 50% relief of the pain from a  series of diagnostic injections conducted in separate occasions.  Pain Score: Pre-procedure: 2 /10 Post-procedure: 0-No pain/10  Pre-op Assessment:  [name] is a 63 y.o. (year old), adult patient, seen today for interventional treatment. He  has a past surgical history that includes Breast enhancement surgery. [name] has a current medication list which includes the following prescription(s): amlodipine, celecoxib, cyclobenzaprine, ferrous sulfate, folic acid, gabapentin, lisinopril, naproxen sodium, trazodone, and tramadol, and the following Facility-Administered Medications: fentanyl and midazolam. His primarily concern today is the Back Pain  Initial Vital Signs:  Pulse/HCG Rate: 82ECG Heart Rate: 80 Temp: 98 F (36.7 C) Resp: 12 BP: (!) 148/87 SpO2: 100 %  BMI: Estimated body mass index is 20.78 kg/m as calculated from the following:   Height as of this encounter: 5\' 1"  (1.549 m).   Weight as of this encounter: 110 lb (49.9 kg).  Risk Assessment: Allergies: Reviewed. He is allergic to codeine.  Allergy Precautions: None required Coagulopathies: Reviewed. None identified.  Blood-thinner therapy: None at this time Active Infection(s): Reviewed. None identified. [name] is afebrile  Site Confirmation: [name] was asked to confirm the procedure and laterality before marking the site Procedure checklist: Completed Consent: Before the procedure and under the influence of no sedative(s), amnesic(s), or anxiolytics, the patient was informed of the treatment options, risks and possible complications. To fulfill our ethical and legal obligations, as recommended by the American Medical Association's Code of Ethics, I have informed the patient of my clinical impression; the nature and purpose of the treatment or procedure; the risks, benefits, and possible complications of the intervention; the alternatives, including doing nothing; the risk(s) and benefit(s) of the  alternative treatment(s) or procedure(s); and the risk(s) and benefit(s) of doing nothing. The patient was provided information about the general  risks and possible complications associated with the procedure. These may include, but are not limited to: failure to achieve desired goals, infection, bleeding, organ or nerve damage, allergic reactions, paralysis, and death. In addition, the patient was informed of those risks and complications associated to Spine-related procedures, such as failure to decrease pain; infection (i.e.: Meningitis, epidural or intraspinal abscess); bleeding (i.e.: epidural hematoma, subarachnoid hemorrhage, or any other type of intraspinal or peri-dural bleeding); organ or nerve damage (i.e.: Any type of peripheral nerve, nerve root, or spinal cord injury) with subsequent damage to sensory, motor, and/or autonomic systems, resulting in permanent pain, numbness, and/or weakness of one or several areas of the body; allergic reactions; (i.e.: anaphylactic reaction); and/or death. Furthermore, the patient was informed of those risks and complications associated with the medications. These include, but are not limited to: allergic reactions (i.e.: anaphylactic or anaphylactoid reaction(s)); adrenal axis suppression; blood sugar elevation that in diabetics may result in ketoacidosis or comma; water retention that in patients with history of congestive heart failure may result in shortness of breath, pulmonary edema, and decompensation with resultant heart failure; weight gain; swelling or edema; medication-induced neural toxicity; particulate matter embolism and blood vessel occlusion with resultant organ, and/or nervous system infarction; and/or aseptic necrosis of one or more joints. Finally, the patient was informed that Medicine is not an exact science; therefore, there is also the possibility of unforeseen or unpredictable risks and/or possible complications that may result in a  catastrophic outcome. The patient indicated having understood very clearly. We have given the patient no guarantees and we have made no promises. Enough time was given to the patient to ask questions, all of which were answered to the patient's satisfaction. Mr. Bucklin has indicated that he wanted to continue with the procedure. Attestation: I, the ordering provider, attest that I have discussed with the patient the benefits, risks, side-effects, alternatives, likelihood of achieving goals, and potential problems during recovery for the procedure that I have provided informed consent. Date   Time: 01/04/2019 10:26 AM  Pre-Procedure Preparation:  Monitoring: As per clinic protocol. Respiration, ETCO2, SpO2, BP, heart rate and rhythm monitor placed and checked for adequate function Safety Precautions: Patient was assessed for positional comfort and pressure points before starting the procedure. Time-out: I initiated and conducted the "Time-out" before starting the procedure, as per protocol. The patient was asked to participate by confirming the accuracy of the "Time Out" information. Verification of the correct person, site, and procedure were performed and confirmed by me, the nursing staff, and the patient. "Time-out" conducted as per Joint Commission's Universal Protocol (UP.01.01.01). Time: 1120  Description of Procedure:          Laterality: Left Levels:  T12, L1, L2, & L3 Medial Branch Level(s), at the T12-L1, L1-2, L2-3 lumbar facet joints. Area Prepped: Lumbosacral Prepping solution: DuraPrep (Iodine Povacrylex [0.7% available iodine] and Isopropyl Alcohol, 74% w/w) Safety Precautions: Aspiration looking for blood return was conducted prior to all injections. At no point did we inject any substances, as a needle was being advanced. Before injecting, the patient was told to immediately notify me if he was experiencing any new onset of "ringing in the ears, or metallic taste in the mouth". No  attempts were made at seeking any paresthesias. Safe injection practices and needle disposal techniques used. Medications properly checked for expiration dates. SDV (single dose vial) medications used. After the completion of the procedure, all disposable equipment used was discarded in the proper designated medical waste containers. Local  Anesthesia: Protocol guidelines were followed. The patient was positioned over the fluoroscopy table. The area was prepped in the usual manner. The time-out was completed. The target area was identified using fluoroscopy. A 12-in long, straight, sterile hemostat was used with fluoroscopic guidance to locate the targets for each level blocked. Once located, the skin was marked with an approved surgical skin marker. Once all sites were marked, the skin (epidermis, dermis, and hypodermis), as well as deeper tissues (fat, connective tissue and muscle) were infiltrated with a small amount of a short-acting local anesthetic, loaded on a 10cc syringe with a 25G, 1.5-in  Needle. An appropriate amount of time was allowed for local anesthetics to take effect before proceeding to the next step. Local Anesthetic: Lidocaine 2.0% The unused portion of the local anesthetic was discarded in the proper designated containers. Technical explanation of process:  Radiofrequency Ablation (RFA) T12 Medial Branch Nerve RFA: The target area for the T12 medial branch is at the junction of the postero-lateral aspect of the superior articular process and the superior, posterior, and medial edge of the transverse process of L1. Under fluoroscopic guidance, a Radiofrequency needle was inserted until contact was made with os over the superior postero-lateral aspect of the pedicular shadow (target area). Sensory and motor testing was conducted to properly adjust the position of the needle. Once satisfactory placement of the needle was achieved, the numbing solution was slowly injected after negative  aspiration for blood. 2.0 mL of the nerve block solution was injected without difficulty or complication. After waiting for at least 3 minutes, the ablation was performed. Once completed, the needle was removed intact. L1 Medial Branch Nerve RFA: The target area for the L1 medial branch is at the junction of the postero-lateral aspect of the superior articular process and the superior, posterior, and medial edge of the transverse process of L2. Under fluoroscopic guidance, a Radiofrequency needle was inserted until contact was made with os over the superior postero-lateral aspect of the pedicular shadow (target area). Sensory and motor testing was conducted to properly adjust the position of the needle. Once satisfactory placement of the needle was achieved, the numbing solution was slowly injected after negative aspiration for blood. 2.0 mL of the nerve block solution was injected without difficulty or complication. After waiting for at least 3 minutes, the ablation was performed. Once completed, the needle was removed intact. L2 Medial Branch Nerve RFA: The target area for the L2 medial branch is at the junction of the postero-lateral aspect of the superior articular process and the superior, posterior, and medial edge of the transverse process of L3. Under fluoroscopic guidance, a Radiofrequency needle was inserted until contact was made with os over the superior postero-lateral aspect of the pedicular shadow (target area). Sensory and motor testing was conducted to properly adjust the position of the needle. Once satisfactory placement of the needle was achieved, the numbing solution was slowly injected after negative aspiration for blood. 2.0 mL of the nerve block solution was injected without difficulty or complication. After waiting for at least 3 minutes, the ablation was performed. Once completed, the needle was removed intact. L3 Medial Branch Nerve RFA: The target area for the L3 medial branch is at  the junction of the postero-lateral aspect of the superior articular process and the superior, posterior, and medial edge of the transverse process of L4. Under fluoroscopic guidance, a Radiofrequency needle was inserted until contact was made with os over the superior postero-lateral aspect of the pedicular shadow (target  area). Sensory and motor testing was conducted to properly adjust the position of the needle. Once satisfactory placement of the needle was achieved, the numbing solution was slowly injected after negative aspiration for blood. 2.0 mL of the nerve block solution was injected without difficulty or complication. After waiting for at least 3 minutes, the ablation was performed. Once completed, the needle was removed intact.  Radiofrequency lesioning (ablation):  Radiofrequency Generator: NeuroTherm [id number] Sensory Stimulation Parameters: 50 Hz was used to locate & identify the nerve, making sure that the needle was positioned such that there was no sensory stimulation below 0.3 V or above 0.7 V. Motor Stimulation Parameters: 2 Hz was used to evaluate the motor component. Care was taken not to lesion any nerves that demonstrated motor stimulation of the lower extremities at an output of less than 2.5 times that of the sensory threshold, or a maximum of 2.0 V. Lesioning Technique Parameters: Standard Radiofrequency settings. (Not bipolar or pulsed.) Temperature Settings: 80 degrees C Lesioning time: 60 seconds Intra-operative Compliance: Compliant Materials & Medications: Needle(s) (Electrode/Cannula) Type: Teflon-coated, curved tip, Radiofrequency needle(s) Gauge: 22G Length: 10cm Numbing solution: 0.2% PF-Ropivacaine + Triamcinolone (40 mg/mL) diluted to a final concentration of 4 mg of Triamcinolone/mL of Ropivacaine The unused portion of the solution was discarded in the proper designated containers.  Once the entire procedure was completed, the treated area was cleaned, making sure  to leave some of the prepping solution back to take advantage of its long term bactericidal properties.  Illustration of the posterior view of the lumbar spine and the posterior neural structures. Laminae of L2 through S1 are labeled. DPRL5, dorsal primary ramus of L5; DPRS1, dorsal primary ramus of S1; DPR3, dorsal primary ramus of L3; FJ, facet (zygapophyseal) joint L3-L4; I, inferior articular process of L4; LB1, lateral branch of dorsal primary ramus of L1; IAB, inferior articular branches from L3 medial branch (supplies L4-L5 facet joint); IBP, intermediate branch plexus; MB3, medial branch of dorsal primary ramus of L3; NR3, third lumbar nerve root; S, superior articular process of L5; SAB, superior articular branches from L4 (supplies L4-5 facet joint also); TP3, transverse process of L3.  Vitals:   01/04/19 1209 01/04/19 1219 01/04/19 1229 01/04/19 1234  BP: (!) 125/98 108/77 115/63 115/63  Pulse:      Resp: 14 16 18 16   Temp:    97.8 F (36.6 C)  TempSrc:    Temporal  SpO2: 100% 100% 100% 100%  Weight:      Height:        Start Time: 1120 hrs. End Time: 1157 hrs.  Imaging Guidance (Spinal):          Type of Imaging Technique: Fluoroscopy Guidance (Spinal) Indication(s): Assistance in needle guidance and placement for procedures requiring needle placement in or near specific anatomical locations not easily accessible without such assistance. Exposure Time: Please see nurses notes. Contrast: None used. Fluoroscopic Guidance: I was personally present during the use of fluoroscopy. "Tunnel Vision Technique" used to obtain the best possible view of the target area. Parallax error corrected before commencing the procedure. "Direction-depth-direction" technique used to introduce the needle under continuous pulsed fluoroscopy. Once target was reached, antero-posterior, oblique, and lateral fluoroscopic projection used confirm needle placement in all planes. Images permanently stored in  EMR. Interpretation: No contrast injected. I personally interpreted the imaging intraoperatively. Adequate needle placement confirmed in multiple planes. Permanent images saved into the patient's record.  Antibiotic Prophylaxis:   Anti-infectives (From admission, onward)   None  Indication(s): None identified  Post-operative Assessment:  Post-procedure Vital Signs:  Pulse/HCG Rate: 7081 Temp: 97.8 F (36.6 C) Resp: 16 BP: 115/63 SpO2: 100 %  EBL: None  Complications: No immediate post-treatment complications observed by team, or reported by patient.  Note: The patient tolerated the entire procedure well. A repeat set of vitals were taken after the procedure and the patient was kept under observation following institutional policy, for this type of procedure. Post-procedural neurological assessment was performed, showing return to baseline, prior to discharge. The patient was provided with post-procedure discharge instructions, including a section on how to identify potential problems. Should any problems arise concerning this procedure, the patient was given instructions to immediately contact us, at any time, without hesitation. In any case, we plan to contact the patient by telephone for a follow-up status report regarding this interventional procedure.  Comments:  No additional relevant information.  Plan of Care  Orders:  Orders Placed This Encounter  Procedures   Radiofrequency,Lumbar    Scheduling Instructions:     Side(s): Left-sided     Level: (L1,L2,& L3 Medial Branch Nerves)     Sedation: With Sedation     Timeframe: Today    Order Specific Question:   Where will this procedure be performed?    Answer:   [facility]   Radiofrequency,Thoracic    Scheduling Instructions:     Side(s): Left-sided     Level(s): T4, T5, T6, T7, T8, T9, T10, T11, Medial Branch Nerve(s)     Sedation: With Sedation     Timeframe: Today    Order Specific Question:   Where  will this procedure be performed?    Answer:   [facility]   DG PAIN CLIN[facility] C-ARM 1-60 MIN NO REPORT    Intraoperative interpretation by procedural physician at [place].    Standing Status:   Standing    Number of Occurrences:   1    Order Specific Question:   Reason for exam:    Answer:   Assistance in needle guidance and placement for procedures requiring needle placement in or near specific anatomical locations not easily accessible without such assistance.   Provider attestation of informed consent for procedure/surgical case    I, the ordering provider, attest that I have discussed with the patient the benefits, risks, side effects, alternatives, likelihood of achieving goals and potential problems during recovery for the procedure that I have provided informed consent.    Standing Status:   Standing    Number of Occurrences:   1   Informed Consent Details: Transcribe to consent form and obtain patient signature    Consent Attestation: I, the ordering provider, attest that I have discussed with the patient the benefits, risks, side-effects, alternatives, likelihood of achieving goals, and potential problems during recovery for the procedure that I have provided informed consent.    Standing Status:   Standing    Number of Occurrences:   1    Order Specific Question:   Procedure    Answer:   Thoracic facet medial branch RFA under fluoroscopic guidance. (See notes for level and laterality.)    Order Specific Question:   Surgeon    Answer:   Abdulahad Mederos A. Dossie Arbour, MD    Order Specific Question:   Indication/Reason    Answer:   Chronic thoracic pain secondary to thoracic facet syndrome   Informed Consent Details: Transcribe to consent form and obtain patient signature    Consent Attestation: I, the ordering  provider, attest that I have discussed with the patient the benefits, risks, side-effects, alternatives, likelihood of achieving goals, and potential problems  during recovery for the procedure that I have provided informed consent.    Standing Status:   Standing    Number of Occurrences:   1    Order Specific Question:   Procedure    Answer:   Left Lumbar facet medial branch RFA under fluoroscopic guidance.    Order Specific Question:   Surgeon    Answer:   Kathlen Brunswick. Dossie Arbour, MD    Order Specific Question:   Indication/Reason    Answer:   Chronic low back pain secondary to lumbar facet syndrome   Chronic Opioid Analgesic:  Tramadol 50 mg 1 every 6 hours (200 mg/day of tramadol) (Last written in 2018) (D/C'ed due to undisclosed THC on 02/02/2017 UDS) MME/day: 20 mg/day.   Medications ordered for procedure: Meds ordered this encounter  Medications   lidocaine (XYLOCAINE) 2 % (with pres) injection 400 mg   lactated ringers infusion 1,000 mL   midazolam (VERSED) 5 MG/5ML injection 1-2 mg    Make sure Flumazenil is available in the pyxis when using this medication. If oversedation occurs, administer 0.2 mg IV over 15 sec. If after 45 sec no response, administer 0.2 mg again over 1 min; may repeat at 1 min intervals; not to exceed 4 doses (1 mg)   fentaNYL (SUBLIMAZE) injection 25-50 mcg    Make sure Narcan is available in the pyxis when using this medication. In the event of respiratory depression (RR< 8/min): Titrate NARCAN (naloxone) in increments of 0.1 to 0.2 mg IV at 2-3 minute intervals, until desired degree of reversal.   ropivacaine (PF) 2 mg/mL (0.2%) (NAROPIN) injection 9 mL   dexamethasone (DECADRON) injection 10 mg   ropivacaine (PF) 2 mg/mL (0.2%) (NAROPIN) injection 9 mL   triamcinolone acetonide (KENALOG-40) injection 40 mg   DISCONTD: traMADol (ULTRAM) 50 MG tablet    Sig: Take 1-2 tablets (50-100 mg total) by mouth 4 (four) times daily. Must last 30 days    Dispense:  240 tablet    Refill:  0    Chronic Pain: STOP Act (Not applicable) Fill 1 day early if closed on refill date. Do not fill until: 01/04/2019. To last  until: 02/03/2019. Avoid benzodiazepines within 8 hours of opioids   traMADol (ULTRAM) 50 MG tablet    Sig: Take 1-2 tablets (50-100 mg total) by mouth 4 (four) times daily. Must last 30 days    Dispense:  240 tablet    Refill:  0    Chronic Pain: STOP Act (Not applicable) Fill 1 day early if closed on refill date. Do not fill until: 01/04/2019. To last until: 02/03/2019. Avoid benzodiazepines within 8 hours of opioids   Medications administered: We administered lidocaine, lactated ringers, midazolam, fentaNYL, ropivacaine (PF) 2 mg/mL (0.2%), dexamethasone, ropivacaine (PF) 2 mg/mL (0.2%), and triamcinolone acetonide.  See the medical record for exact dosing, route, and time of administration.  Follow-up plan:   Return for (VV), 6 wk PP-F/U Eval.       Interventional therapies:  Considering: Palliative bilateral T12, L1, L2, & L3 Thoracolumbar Facet MBB #4    Palliative PRN treatment(s): Palliative LeftT12, L1, L2, &L3Thoracolumbar FacetRFA #3(Last done on 01/04/2019) PalliativeRightT12, L1, L2, &L3Thoracolumbar FacetRFA #2(Last done on 09/27/16)     Recent Visits Date Type Provider Dept  12/11/18 Office Visit Milinda Pointer, MD [facility]  Showing recent visits within past 90 days  and meeting all other requirements   Today's Visits Date Type Provider Dept  01/04/19 Procedure visit Milinda Pointer, MD [facility]  Showing today's visits and meeting all other requirements   Future Appointments Date Type Provider Dept  02/14/19 Appointment Milinda Pointer, MD [facility]  Showing future appointments within next 90 days and meeting all other requirements   Disposition: Discharge home  Discharge Date & Time: 01/04/2019; 1235 hrs.   Primary Care Physician: Donnie Coffin, MD Location: [facility] Note by: Gaspar Cola, MD Date: 01/04/2019; Time: 4:08 PM  Disclaimer:  Medicine is not an Armed forces logistics/support/administrative officer. The only guarantee in medicine is that nothing is guaranteed. It is important to note that the decision to proceed with this intervention was based on the information collected from the patient. The Data and conclusions were drawn from the patient's questionnaire, the interview, and the physical examination. Because the information was provided in large part by the patient, it cannot be guaranteed that it has not been purposely or unconsciously manipulated. Every effort has been made to obtain as much relevant data as possible for this evaluation. It is important to note that the conclusions that lead to this procedure are derived in large part from the available data. Always take into account that the treatment will also be dependent on availability of resources and existing treatment guidelines, considered by other Pain Management Practitioners as being common knowledge and practice, at the time of the intervention. For Medico-Legal purposes, it is also important to point out that variation in procedural techniques and pharmacological choices are the acceptable norm. The indications, contraindications, technique, and results of the above procedure should only be interpreted and judged by a Board-Certified Interventional Pain Specialist with extensive familiarity and expertise in the same exact procedure and technique.

## 2019-01-04 NOTE — Patient Instructions (Addendum)
___________________________________________________________________________________________  Post-Radiofrequency (RF) Discharge Instructions  You have just completed a Radiofrequency Neurotomy.  The following instructions will provide you with information and guidelines for self-care upon discharge.  If at any time you have questions or concerns please call your physician. DO NOT DRIVE YOURSELF!!  Instructions:  Apply ice: Fill a plastic sandwich bag with crushed ice. Cover it with a small towel and apply to injection site. Apply for 15 minutes then remove x 15 minutes. Repeat sequence on day of procedure, until you go to bed. The purpose is to minimize swelling and discomfort after procedure.  Apply heat: Apply heat to procedure site starting the day following the procedure. The purpose is to treat any soreness and discomfort from the procedure.  Food intake: No eating limitations, unless stipulated above.  Nevertheless, if you have had sedation, you may experience some nausea.  In this case, it may be wise to wait at least two hours prior to resuming regular diet.  Physical activities: Keep activities to a minimum for the first 8 hours after the procedure. For the first 24 hours after the procedure, do not drive a motor vehicle,  Operate heavy machinery, power tools, or handle any weapons.  Consider walking with the use of an assistive device or accompanied by an adult for the first 24 hours.  Do not drink alcoholic beverages including beer.  Do not make any important decisions or sign any legal documents. Go home and rest today.  Resume activities tomorrow, as tolerated.  Use caution in moving about as you may experience mild leg weakness.  Use caution in cooking, use of household electrical appliances and climbing steps.  Driving: If you have received any sedation, you are not allowed to drive for 24 hours after your procedure.  Blood thinner: Restart your blood thinner 6 hours after your  procedure. (Only for those taking blood thinners)  Insulin: As soon as you can eat, you may resume your normal dosing schedule. (Only for those taking insulin)  Medications: May resume pre-procedure medications.  Do not take any drugs, other than what has been prescribed to you.  Infection prevention: Keep procedure site clean and dry.  Post-procedure Pain Diary: Extremely important that this be done correctly and accurately. Recorded information will be used to determine the next step in treatment.  Pain evaluated is that of treated area only. Do not include pain from an untreated area.  Complete every hour, on the hour, for the initial 8 hours. Set an alarm to help you do this part accurately.  Do not go to sleep and have it completed later. It will not be accurate.  Follow-up appointment: Keep your follow-up appointment after the procedure. Usually 2 weeks for most procedures. (6 weeks in the case of radiofrequency.) Bring you pain diary.   Expect:  From numbing medicine (AKA: Local Anesthetics): Numbness or decrease in pain.  Onset: Full effect within 15 minutes of injected.  Duration: It will depend on the type of local anesthetic used. On the average, 1 to 8 hours.   From steroids: Decrease in swelling or inflammation. Once inflammation is improved, relief of the pain will follow.  Onset of benefits: Depends on the amount of swelling present. The more swelling, the longer it will take for the benefits to be seen. In some cases, up to 10 days.  Duration: Steroids will stay in the system x 2 weeks. Duration of benefits will depend on multiple posibilities including persistent irritating factors.  From procedure: Some  discomfort is to be expected once the numbing medicine wears off. This should be minimal if ice and heat are applied as instructed.  Call if:  You experience numbness and weakness that gets worse with time, as opposed to wearing off.  He experience any unusual  bleeding, difficulty breathing, or loss of the ability to control your bowel and bladder. (This applies to Spinal procedures only)  You experience any redness, swelling, heat, red streaks, elevated temperature, fever, or any other signs of a possible infection.  Emergency Numbers:  [facility] hours (Monday - Thursday, 8:00 AM - 4:00 PM) (Friday, 9:00 AM - 12:00 Noon): [phone]  After hours: [phone] ____________________________________________________________________________________________    Radiofrequency Lesioning Radiofrequency lesioning is a procedure that is performed to relieve pain. The procedure is often used for back, neck, or arm pain. Radiofrequency lesioning involves the use of a machine that creates radio waves to make heat. During the procedure, the heat is applied to the nerve that carries the pain signal. The heat damages the nerve and interferes with the pain signal. Pain relief usually starts about 2 weeks after the procedure and lasts for 6 months to 1 year. You will be awake during the procedure. You will need to be able to talk with the health care provider during the procedure. Tell a health care provider about:  Any allergies you have.  All medicines you are taking, including vitamins, herbs, eye drops, creams, and over-the-counter medicines.  Any problems you or family members have had with anesthetic medicines.  Any blood disorders you have.  Any surgeries you have had.  Any medical conditions you have or have had.  Whether you are pregnant or may be pregnant. What are the risks? Generally, this is a safe procedure. However, problems may occur, including:  Pain or soreness at the injection site.  Allergic reaction to medicines given during the procedure.  Bleeding.  Infection at the injection site.  Damage to nerves or blood vessels. What happens before the procedure? Staying hydrated Follow instructions from your health care  provider about hydration, which may include:  Up to 2 hours before the procedure - you may continue to drink clear liquids, such as water, clear fruit juice, black coffee, and plain tea. Eating and drinking Follow instructions from your health care provider about eating and drinking, which may include:  8 hours before the procedure - stop eating heavy meals or foods, such as meat, fried foods, or fatty foods.  6 hours before the procedure - stop eating light meals or foods, such as toast or cereal.  6 hours before the procedure - stop drinking milk or drinks that contain milk.  2 hours before the procedure - stop drinking clear liquids. Medicines Ask your health care provider about:  Changing or stopping your regular medicines. This is especially important if you are taking diabetes medicines or blood thinners.  Taking medicines such as aspirin and ibuprofen. These medicines can thin your blood. Do not take these medicines unless your health care provider tells you to take them.  Taking over-the-counter medicines, vitamins, herbs, and supplements. General instructions  Plan to have someone take you home from the hospital or clinic.  If you will be going home right after the procedure, plan to have someone with you for 24 hours.  Ask your health care provider what steps will be taken to help prevent infection. These may include: ? Removing hair at the procedure site. ? Washing skin with a germ-killing  soap. ? Taking antibiotic medicine. What happens during the procedure?   An IV will be inserted into one of your veins.  You will be given one or more of the following: ? A medicine to help you relax (sedative). ? A medicine to numb the area (local anesthetic).  Your health care provider will insert a radiofrequency needle into the area to be treated. This is done with the help of a type of X-ray (fluoroscopy).  A wire that carries the radio waves (electrode) will be put through  the radiofrequency needle.  An electrical pulse will be sent through the electrode to verify the correct nerve that is causing your pain. You will feel a tingling sensation, and you may have muscle twitching.  The tissue around the needle tip will be heated by an electric current that comes from the radiofrequency machine. This will numb the nerves.  The needle will be removed.  A bandage (dressing) will be put on the insertion area. The procedure may vary among health care providers and hospitals. What happens after the procedure?  Your blood pressure, heart rate, breathing rate, and blood oxygen level will be monitored until you leave the hospital or clinic.  Return to your normal activities as told by your health care provider. Ask your health care provider what activities are safe for you.  Do not drive for 24 hours if you were given a sedative during your procedure. Summary  Radiofrequency lesioning is a procedure that is performed to relieve pain. The procedure is often used for back, neck, or arm pain.  Radiofrequency lesioning involves the use of a machine that creates radio waves to make heat.  Plan to have someone take you home from the hospital or clinic. Do not drive for 24 hours if you were given a sedative during your procedure.  Return to your normal activities as told by your health care provider. Ask your health care provider what activities are safe for you. This information is not intended to replace advice given to you by your health care provider. Make sure you discuss any questions you have with your health care provider. Document Released: 01/13/2011 Document Revised: 02/02/2018 Document Reviewed: 02/02/2018 Elsevier Patient Education  2020 [employer].

## 2019-01-05 ENCOUNTER — Telehealth: Payer: Self-pay

## 2019-01-05 NOTE — Telephone Encounter (Signed)
Called patient back to let her know that her medication was approved and she should be able to get it today.

## 2019-01-05 NOTE — Telephone Encounter (Signed)
Patient states she is sore and waiting on the medication dr Zenaida Deed prescribed. Told her that a PA was done yesterday and to call if needed.

## 2019-02-01 ENCOUNTER — Encounter (INDEPENDENT_AMBULATORY_CARE_PROVIDER_SITE_OTHER): Payer: Self-pay

## 2019-02-01 ENCOUNTER — Other Ambulatory Visit: Payer: Self-pay

## 2019-02-01 ENCOUNTER — Encounter: Payer: Self-pay | Admitting: Gastroenterology

## 2019-02-01 ENCOUNTER — Ambulatory Visit: Payer: BLUE CROSS/BLUE SHIELD | Admitting: Gastroenterology

## 2019-02-01 VITALS — BP 174/79 | HR 71 | Temp 97.9°F | Wt 113.4 lb

## 2019-02-01 DIAGNOSIS — D509 Iron deficiency anemia, unspecified: Secondary | ICD-10-CM

## 2019-02-01 DIAGNOSIS — Z1211 Encounter for screening for malignant neoplasm of colon: Secondary | ICD-10-CM | POA: Diagnosis not present

## 2019-02-01 DIAGNOSIS — Z1212 Encounter for screening for malignant neoplasm of rectum: Secondary | ICD-10-CM

## 2019-02-01 MED ORDER — NA SULFATE-K SULFATE-MG SULF 17.5-3.13-1.6 GM/177ML PO SOLN: 354.0000 mL | Freq: Once | ORAL | 0 refills | Status: AC

## 2019-02-02 LAB — CBC
Hematocrit: 34.4 % (ref 34.0–46.6)
Hemoglobin: 11.5 g/dL (ref 11.1–15.9)
MCH: 29.7 pg (ref 26.6–33.0)
MCHC: 33.4 g/dL (ref 31.5–35.7)
MCV: 89 fL (ref 79–97)
Platelets: 274 10*3/uL (ref 150–450)
RBC: 3.87 x10E6/uL (ref 3.77–5.28)
RDW: 14.4 % (ref 11.7–15.4)
WBC: 9.6 10*3/uL (ref 3.4–10.8)

## 2019-02-04 NOTE — Progress Notes (Signed)
Deanna Jacobson [place], [place] [address]  Main: [phone]  Fax: [phone]   Gastroenterology Consultation  Referring Provider:     Donnie Coffin, MD Primary Care Physician:  Donnie Coffin, MD Reason for Consultation:     Diarrhea        HPI:    Chief Complaint  Patient presents with  . Diarrhea    every morning but the diarrhea is not black like it was when she was on iron pills,. Stopped them last week. Patient states she was going through the night but that has stopped     Deanna Jacobson is a 63 y.o. y/o adult referred for consultation & management  by Dr. Clide Deutscher, Edmonia Lynch, MD.  Patient reports 1 to 6-month history of 2-3 loose stools a day, which are sometimes dark.  No abdominal pain, no nausea or vomiting.  Recent labs in June 2020 showed low ferritin of 14 and low iron saturation of 11%.  Patient is on oral iron replacement.  No prior EGD or colonoscopy.  No dysphagia.  No weight loss.  No prior family history of colon cancer.  Past Medical History:  Diagnosis Date  . Chronic back pain   . Hypertension     Past Surgical History:  Procedure Laterality Date  . BREAST ENHANCEMENT SURGERY      Prior to Admission medications   Medication Sig Start Date End Date Taking? Authorizing Provider  amLODipine (NORVASC) 2.5 MG tablet Take 2.5 mg by mouth.    [provider]  celecoxib (CELEBREX) 100 MG capsule Take 1 capsule (100 mg total) by mouth 2 (two) times daily. 12/11/18 06/09/19  Milinda Pointer, MD  cyclobenzaprine (FLEXERIL) 10 MG tablet Take 1 tablet (10 mg total) by mouth every 8 (eight) hours as needed for muscle spasms. Patient not taking: Reported on 02/01/2019 12/11/18 06/09/19  Milinda Pointer, MD  ferrous sulfate 325 (65 FE) MG tablet Take 325 mg by mouth daily with breakfast.    [provider]  folic acid (FOLVITE) 1 MG tablet Take 1 mg by mouth daily.    [provider]  gabapentin  (NEURONTIN) 600 MG tablet Take 1 tablet (600 mg total) by mouth 3 (three) times daily. 01/09/19 06/08/19  Milinda Pointer, MD  lisinopril (PRINIVIL,ZESTRIL) 20 MG tablet Take 1 tablet (20 mg total) by mouth daily. 10/29/15   Fritzi Mandes, MD  naproxen sodium (ALEVE) 220 MG tablet Take 220 mg by mouth.    [provider]  traMADol (ULTRAM) 50 MG tablet Take 1-2 tablets (50-100 mg total) by mouth 4 (four) times daily. Must last 30 days 01/04/19 02/03/19  Milinda Pointer, MD  traZODone (DESYREL) 100 MG tablet Take 100 mg by mouth at bedtime.  10/27/15   [provider]    Family History  Problem Relation Age of Onset  . Cancer Mother   . Hypertension Father      Social History   Tobacco Use  . Smoking status: Former Smoker    Packs/day: 1.00    Types: Cigarettes    Quit date: 08/07/2018    Years since quitting: 0.4  . Smokeless tobacco: Never Used  Substance Use Topics  . Alcohol use: Yes    Alcohol/week: 0.0 standard drinks    Comment: daily  drinks 4 beers  . Drug use: Yes    Types: Marijuana    Comment: states she will take a hit off of a joint if anyone  has one    Allergies as of 02/01/2019 - Review Complete 02/01/2019  Allergen Reaction Noted  . Codeine Nausea And Vomiting 06/29/2015    Review of Systems:    All systems reviewed and negative except where noted in HPI.   Physical Exam:  BP (!) 174/79 (BP Location: Left Arm, Patient Position: Sitting, Cuff Size: Normal)   Pulse 71   Temp 97.9 F (36.6 C) (Oral)   Wt 113 lb 6 oz (51.4 kg)   BMI 21.42 kg/m  No LMP recorded. Patient is postmenopausal. Psych:  Alert and cooperative. Normal mood and affect. General:   Alert,  Well-developed, well-nourished, pleasant and cooperative in NAD Head:  Normocephalic and atraumatic. Eyes:  Sclera clear, no icterus.   Conjunctiva pink. Ears:  Normal auditory acuity. Nose:  No deformity, discharge, or lesions. Mouth:  No deformity or lesions,oropharynx pink & moist.  Neck:  Supple; no masses or thyromegaly. Abdomen:  Normal bowel sounds.  No bruits.  Soft, non-tender and non-distended without masses, hepatosplenomegaly or hernias noted.  No guarding or rebound tenderness.    Msk:  Symmetrical without gross deformities. Good, equal movement & strength bilaterally. Pulses:  Normal pulses noted. Extremities:  No clubbing or edema.  No cyanosis. Neurologic:  Alert and oriented x3;  grossly normal neurologically. Skin:  Intact without significant lesions or rashes. No jaundice. Lymph Nodes:  No significant cervical adenopathy. Psych:  Alert and cooperative. Normal mood and affect.   Labs: CBC    Component Value Date/Time   WBC 9.6 02/01/2019 1522   WBC 7.3 02/01/2016 1514   RBC 3.87 02/01/2019 1522   RBC 3.45 (L) 02/01/2016 1514   HGB 11.5 02/01/2019 1522   HCT 34.4 02/01/2019 1522   PLT 274 02/01/2019 1522   MCV 89 02/01/2019 1522   MCH 29.7 02/01/2019 1522   MCH 31.8 02/01/2016 1514   MCHC 33.4 02/01/2019 1522   MCHC 35.8 02/01/2016 1514   RDW 14.4 02/01/2019 1522   LYMPHSABS 1.5 12/23/2016 1615   MONOABS 0.7 02/01/2016 1514   EOSABS 0.3 12/23/2016 1615   BASOSABS 0.0 12/23/2016 1615   CMP     Component Value Date/Time   NA 136 06/13/2018 0945   K 4.2 06/13/2018 0945   CL 98 06/13/2018 0945   CO2 27 02/02/2017 0948   GLUCOSE 66 06/13/2018 0945   GLUCOSE 129 (H) 02/02/2017 0948   BUN 10 06/13/2018 0945   CREATININE 0.98 06/13/2018 0945   CALCIUM 9.2 06/13/2018 0945   PROT 6.2 06/13/2018 0945   ALBUMIN 3.9 06/13/2018 0945   AST 12 06/13/2018 0945   ALT 12 (L) 02/02/2017 0948   ALKPHOS 83 06/13/2018 0945   BILITOT <0.2 06/13/2018 0945   GFRNONAA 61 06/13/2018 0945   GFRAA 71 06/13/2018 0945    Imaging Studies: No results found.  Assessment and Plan:   [name] is a 63 y.o. y/o adult has been referred for diarrhea and iron deficiency anemia  Unlikely to be infectious diarrhea  Patient is due for screening  colonoscopy in addition to colonoscopy for iron deficiency anemia  EGD is also indicated for the same  I have discussed alternative options, risks & benefits,  which include, but are not limited to, bleeding, infection, perforation,respiratory complication & drug reaction.  The patient agrees with this plan & written consent will be obtained.    Continue follow-up with primary care provider to receive iron replacement as needed   Dr Deanna Jacobson  Speech recognition software was used  to dictate the above note.

## 2019-02-12 ENCOUNTER — Other Ambulatory Visit
Admission: RE | Admit: 2019-02-12 | Discharge: 2019-02-12 | Disposition: A | Discharge status: Home / Self Care | Payer: BLUE CROSS/BLUE SHIELD | Source: Ambulatory Visit | Attending: Gastroenterology | Admitting: Gastroenterology

## 2019-02-12 ENCOUNTER — Encounter: Payer: Self-pay | Admitting: Pain Medicine

## 2019-02-12 ENCOUNTER — Other Ambulatory Visit: Payer: Self-pay

## 2019-02-12 DIAGNOSIS — K635 Polyp of colon: Secondary | ICD-10-CM | POA: Insufficient documentation

## 2019-02-12 DIAGNOSIS — Z01812 Encounter for preprocedural laboratory examination: Secondary | ICD-10-CM | POA: Insufficient documentation

## 2019-02-12 DIAGNOSIS — Z20828 Contact with and (suspected) exposure to other viral communicable diseases: Secondary | ICD-10-CM | POA: Diagnosis not present

## 2019-02-12 DIAGNOSIS — D509 Iron deficiency anemia, unspecified: Secondary | ICD-10-CM | POA: Insufficient documentation

## 2019-02-12 DIAGNOSIS — K319 Disease of stomach and duodenum, unspecified: Secondary | ICD-10-CM | POA: Diagnosis not present

## 2019-02-12 DIAGNOSIS — K209 Esophagitis, unspecified: Secondary | ICD-10-CM | POA: Diagnosis not present

## 2019-02-13 LAB — SARS CORONAVIRUS 2 (TAT 6-24 HRS): SARS Coronavirus 2: NEGATIVE

## 2019-02-13 NOTE — Progress Notes (Signed)
Pain Management Virtual Encounter Note - Virtual Visit via Telephone Telehealth (real-time audio visits between healthcare provider and patient).   Patient's Phone No. & Preferred Pharmacy:  [phone] (home); There is no such number on file (mobile).; (Preferred) [phone] No e-mail address on record  [id number] [address] [id number]: [phone]: [phone]   Pre-screening note:  Our staff contacted Mr. TLaneveand offered him an "in person", "face-to-face" appointment versus a telephone encounter. He indicated preferring the telephone encounter, at this time.   Reason for Virtual Visit: COVID-19*  Social distancing based on CDC and AMA recommendations.   I contacted TLyann Hagstromon 02/14/2019 via telephone.      I clearly identified myself as FGaspar Cola MD. I verified that I was speaking with the correct person using two identifiers (Name: TNiketa Turner and date of birth: 108/20/1957.  Advanced Informed Consent I sought verbal advanced consent from THarvin Hazelfor virtual visit interactions. I informed Mr. TEnglanderof possible security and privacy concerns, risks, and limitations associated with providing "not-in-person" medical evaluation and management services. I also informed Mr. TBastienof the availability of "in-person" appointments. Finally, I informed him that there would be a charge for the virtual visit and that he could be  personally, fully or partially, financially responsible for it. Mr. TKatichexpressed understanding and agreed to proceed.   Historic Elements   Mr. TRashmi Tallentis a 63y.o. year old, adult patient evaluated today after his last encounter by our practice on 01/05/2019. Mr. TZunker has a past medical history of Chronic back pain and Hypertension. He also  has a past surgical history that includes Breast enhancement surgery. Mr. TBevilacqua has a current medication list which includes the following prescription(s): amlodipine, bupropion, celecoxib, gabapentin, lisinopril, suprep bowel prep kit, tramadol, trazodone, and cyclobenzaprine. He  reports that he quit smoking about 6 months ago. His smoking use included cigarettes. He smoked 1.00 pack per day. He has never used smokeless tobacco. He reports current alcohol use. He reports current drug use. Drug: Marijuana. Mr. TTironeis allergic to codeine.   HPI  Today, he is being contacted for a post-procedure assessment.  The patient indicates that the procedure provided her with complete relief of the pain for approximately 5 days after which the pain in the lower portion back pain back.  However in looking at the levels that we have done for her, we did that T11-L3 and we did not go any further below that level.  If she is having pain still lower, she may need to have the lower levels done as well.  She is pending to have a colonoscopy tomorrow and for the time being we will hold on any further interventions.  Today I have renewed her tramadol and Flexeril just to help her through until we can go back to treating the lower portion of her back.  Post-Procedure Evaluation  Procedure (01/04/2019): Therapeutic left-sided T12, L1, L2, and L3 medial branch radiofrequency ablation #2 under fluoroscopic guidance and IV sedation Pre-procedure pain level:  2/10 Post-procedure: 0/10 (100% relief)  Sedation: Sedation provided.  Effectiveness during initial hour after procedure(Ultra-Short Term Relief): 100 %   Local anesthetic used: Long-acting (4-6 hours) Effectiveness: Defined as any analgesic benefit obtained secondary to the administration of local anesthetics. This carries significant diagnostic value as to the etiological location, or anatomical origin, of the pain. Duration of benefit is expected  to coincide with the duration of the local anesthetic used.  Effectiveness during initial 4-6  hours after procedure(Short-Term Relief): 100 %   Long-term benefit: Defined as any relief past the pharmacologic duration of the local anesthetics.  Effectiveness past the initial 6 hours after procedure(Long-Term Relief): 100 % (lasted 5 days)   Current benefits: Defined as benefit that persist at this time.   Analgesia:  Back to baseline Function: Back to baseline ROM: Back to baseline  Pharmacotherapy Assessment  Analgesic: Tramadol 50 mg 1 every 6 hours (200 mg/day of tramadol) (Last written in 2018) (D/C'ed due to undisclosed THC on 02/02/2017 UDS) MME/day: 20 mg/day.   Monitoring: Pharmacotherapy: No side-effects or adverse reactions reported. [place] PMP: PDMP reviewed during this encounter.       Compliance: No problems identified. Effectiveness: Clinically acceptable. Plan: Refer to "POC".  UDS:  Summary  Date Value Ref Range Status  02/02/2017 FINAL  Final    Comment:    ==================================================================== TOXASSURE SELECT 13 (MW) ==================================================================== Test                             Result       Flag       Units Drug Present and Declared for Prescription Verification   Tramadol                       >4000        EXPECTED   ng/mg creat   O-Desmethyltramadol            >4000        EXPECTED   ng/mg creat   N-Desmethyltramadol            421          EXPECTED   ng/mg creat    Source of tramadol is a prescription medication.    O-desmethyltramadol and N-desmethyltramadol are expected    metabolites of tramadol. Drug Present not Declared for Prescription Verification   Carboxy-THC                    33           UNEXPECTED ng/mg creat    Carboxy-THC is a metabolite of tetrahydrocannabinol  (THC).    Source of [facility] is most commonly illicit, but THC is also present    in a scheduled prescription medication. ==================================================================== Test                       Result    Flag   Units      Ref Range   Creatinine              125              mg/dL      >=20 ==================================================================== Declared Medications:  The flagging and interpretation on this report are based on the  following declared medications.  Unexpected results may arise from  inaccuracies in the declared medications.  **Note: The testing scope of this panel includes these medications:  Tramadol  **Note: The testing scope of this panel does not include following  reported medications:  Cyclobenzaprine  Gabapentin  Lisinopril  Meloxicam  Trazodone ==================================================================== For clinical consultation, please call [phone]. ====================================================================    Laboratory Chemistry Profile (12 mo)  Renal: 06/13/2018: BUN 10; BUN/Creatinine Ratio 10; Creatinine, Ser 0.98  Lab Results  Component Value Date  GFRAA 71 06/13/2018   GFRNONAA 61 06/13/2018   Hepatic: 06/13/2018: Albumin 3.9 Lab Results  Component Value Date   AST 12 06/13/2018   ALT 12 (L) 02/02/2017   Other: 06/13/2018: 25-Hydroxy, Vitamin D 45; 25-Hydroxy, Vitamin D-2 <1.0; 25-Hydroxy, Vitamin D-3 45; CRP <1; Sed Rate 13; Vitamin B-12 537 Note: Above Lab results reviewed.  Imaging  Last 90 days:  [facility] C-arm 1-60 Min No Report  Result Date: 01/04/2019 Fluoro was used, but no Radiologist interpretation will be provided. Please refer to "NOTES" tab for provider progress note.   Assessment  The primary encounter diagnosis was Chronic pain syndrome. Diagnoses of Chronic thoracic spine pain (Primary Area of Pain) (midline), Lumbar facet joint syndrome (Bilateral), Chronic pain, and Musculoskeletal pain were also pertinent to this visit.  Plan of Care  I have discontinued Nadalie Pellot's naproxen sodium, ferrous sulfate, and folic acid. I am also having him maintain his  traZODone, lisinopril, amLODipine, celecoxib, gabapentin, Suprep Bowel Prep Kit, buPROPion, traMADol, and cyclobenzaprine.  Pharmacotherapy (Medications Ordered): Meds ordered this encounter  Medications  . traMADol (ULTRAM) 50 MG tablet    Sig: Take 1-2 tablets (50-100 mg total) by mouth 4 (four) times daily. Must last 30 days    Dispense:  240 tablet    Refill:  1    Chronic Pain: STOP Act (Not applicable) Fill 1 day early if closed on refill date. Do not fill until: 02/14/2019. To last until: 04/15/2019. Avoid benzodiazepines within 8 hours of opioids  . cyclobenzaprine (FLEXERIL) 10 MG tablet    Sig: Take 1 tablet (10 mg total) by mouth every 8 (eight) hours as needed for muscle spasms.    Dispense:  90 tablet    Refill:  5    Fill one day early if pharmacy is closed on scheduled refill date. May substitute for generic if available.   Orders:  No orders of the defined types were placed in this encounter.  Follow-up plan:   Return in about 8 weeks (around 04/11/2019) for (VV), (MM).      Interventional therapies:  Considering: Palliative bilateral T12, L1, L2, & L3 Thoracolumbar Facet MBB #4    Palliative PRN treatment(s): Palliative LeftT12, L1, L2, &L3Thoracolumbar FacetRFA #3(Last done on 01/04/2019) PalliativeRightT12, L1, L2, &L3Thoracolumbar FacetRFA #2(Last done on 09/27/16)      Recent Visits Date Type Provider Dept  01/04/19 Procedure visit Milinda Pointer, MD [facility]  12/11/18 Office Visit Milinda Pointer, MD [facility]  Showing recent visits within past 90 days and meeting all other requirements   Today's Visits Date Type Provider Dept  02/14/19 Office Visit Milinda Pointer, MD [facility]  Showing today's visits and meeting all other requirements   Future Appointments No visits were found meeting these conditions.  Showing future appointments within next 90 days and meeting all other requirements    I discussed the assessment and treatment plan with the patient. The patient was provided an opportunity to ask questions and all were answered. The patient agreed with the plan and demonstrated an understanding of the instructions.  Patient advised to call back or seek an in-person evaluation if the symptoms or condition worsens.  Total duration of non-face-to-face encounter: 13 minutes.  Note by: Gaspar Cola, MD Date: 02/14/2019; Time: 2:14 PM  Note: This dictation was prepared with Dragon dictation. Any transcriptional errors that may result from this process are unintentional.  Disclaimer:  * Given the special circumstances of the COVID-19 pandemic, the  federal government has announced that [employer] ([employer]) will exercise its enforcement discretion and will not impose penalties on physicians using telehealth in the event of noncompliance with regulatory requirements under the [employer] and [place] (HIPAA) in connection with the good faith provision of telehealth during the GXQJJ-94 national public health emergency. ([place])

## 2019-02-14 ENCOUNTER — Other Ambulatory Visit: Payer: Self-pay

## 2019-02-14 ENCOUNTER — Encounter: Payer: Self-pay | Admitting: Anesthesiology

## 2019-02-14 ENCOUNTER — Ambulatory Visit: Payer: BLUE CROSS/BLUE SHIELD | Attending: Pain Medicine | Admitting: Pain Medicine

## 2019-02-14 DIAGNOSIS — G8929 Other chronic pain: Secondary | ICD-10-CM

## 2019-02-14 DIAGNOSIS — G894 Chronic pain syndrome: Secondary | ICD-10-CM | POA: Diagnosis not present

## 2019-02-14 DIAGNOSIS — M546 Pain in thoracic spine: Secondary | ICD-10-CM

## 2019-02-14 DIAGNOSIS — M47816 Spondylosis without myelopathy or radiculopathy, lumbar region: Secondary | ICD-10-CM | POA: Diagnosis not present

## 2019-02-14 DIAGNOSIS — M7918 Myalgia, other site: Secondary | ICD-10-CM

## 2019-02-14 MED ORDER — CYCLOBENZAPRINE HCL 10 MG PO TABS: 10.0000 mg | ORAL_TABLET | Freq: Three times a day (TID) | ORAL | 5 refills | Status: DC | PRN

## 2019-02-14 MED ORDER — TRAMADOL HCL 50 MG PO TABS: 50.0000 mg | ORAL_TABLET | Freq: Four times a day (QID) | ORAL | 1 refills | Status: DC

## 2019-02-15 ENCOUNTER — Ambulatory Visit: Payer: BLUE CROSS/BLUE SHIELD | Admitting: Pain Medicine

## 2019-02-15 ENCOUNTER — Ambulatory Visit: Payer: BLUE CROSS/BLUE SHIELD | Admitting: Anesthesiology

## 2019-02-15 ENCOUNTER — Encounter
Admission: RE | Disposition: A | Discharge status: Home / Self Care | Payer: Self-pay | Source: Home / Self Care | Attending: Gastroenterology

## 2019-02-15 ENCOUNTER — Ambulatory Visit
Admission: RE | Admit: 2019-02-15 | Discharge: 2019-02-15 | Disposition: A | Discharge status: Home / Self Care | Payer: BLUE CROSS/BLUE SHIELD | Attending: Gastroenterology | Admitting: Gastroenterology

## 2019-02-15 ENCOUNTER — Encounter: Payer: Self-pay | Admitting: *Deleted

## 2019-02-15 DIAGNOSIS — K2100 Gastro-esophageal reflux disease with esophagitis, without bleeding: Secondary | ICD-10-CM

## 2019-02-15 DIAGNOSIS — K6289 Other specified diseases of anus and rectum: Secondary | ICD-10-CM | POA: Diagnosis not present

## 2019-02-15 DIAGNOSIS — B9681 Helicobacter pylori [H. pylori] as the cause of diseases classified elsewhere: Secondary | ICD-10-CM | POA: Diagnosis not present

## 2019-02-15 DIAGNOSIS — Z1211 Encounter for screening for malignant neoplasm of colon: Secondary | ICD-10-CM

## 2019-02-15 DIAGNOSIS — K3189 Other diseases of stomach and duodenum: Secondary | ICD-10-CM | POA: Diagnosis not present

## 2019-02-15 DIAGNOSIS — K449 Diaphragmatic hernia without obstruction or gangrene: Secondary | ICD-10-CM | POA: Diagnosis not present

## 2019-02-15 DIAGNOSIS — I10 Essential (primary) hypertension: Secondary | ICD-10-CM | POA: Diagnosis not present

## 2019-02-15 DIAGNOSIS — K635 Polyp of colon: Secondary | ICD-10-CM | POA: Diagnosis not present

## 2019-02-15 DIAGNOSIS — Z79899 Other long term (current) drug therapy: Secondary | ICD-10-CM | POA: Diagnosis not present

## 2019-02-15 DIAGNOSIS — Z87891 Personal history of nicotine dependence: Secondary | ICD-10-CM | POA: Diagnosis not present

## 2019-02-15 DIAGNOSIS — Z1212 Encounter for screening for malignant neoplasm of rectum: Secondary | ICD-10-CM | POA: Diagnosis not present

## 2019-02-15 DIAGNOSIS — D509 Iron deficiency anemia, unspecified: Secondary | ICD-10-CM

## 2019-02-15 DIAGNOSIS — K2950 Unspecified chronic gastritis without bleeding: Secondary | ICD-10-CM | POA: Insufficient documentation

## 2019-02-15 DIAGNOSIS — Z791 Long term (current) use of non-steroidal anti-inflammatories (NSAID): Secondary | ICD-10-CM | POA: Diagnosis not present

## 2019-02-15 HISTORY — PX: COLONOSCOPY WITH PROPOFOL: SHX5780

## 2019-02-15 HISTORY — PX: ESOPHAGOGASTRODUODENOSCOPY (EGD) WITH PROPOFOL: SHX5813

## 2019-02-15 SURGERY — COLONOSCOPY WITH PROPOFOL
Anesthesia: General

## 2019-02-15 MED ORDER — SODIUM CHLORIDE 0.9 % IV SOLN
INTRAVENOUS | Status: DC
  Administered 2019-02-15: 10:00:00 via INTRAVENOUS

## 2019-02-15 MED ORDER — FENTANYL CITRATE (PF) 100 MCG/2ML IJ SOLN
INTRAMUSCULAR | Status: AC
  Filled 2019-02-15: qty 2

## 2019-02-15 MED ORDER — SUCCINYLCHOLINE CHLORIDE 20 MG/ML IJ SOLN
INTRAMUSCULAR | Status: AC
  Filled 2019-02-15: qty 1

## 2019-02-15 MED ORDER — PROPOFOL 500 MG/50ML IV EMUL
INTRAVENOUS | Status: DC | PRN
  Administered 2019-02-15: 150 ug/kg/min via INTRAVENOUS

## 2019-02-15 MED ORDER — PROPOFOL 10 MG/ML IV BOLUS
INTRAVENOUS | Status: DC | PRN
  Administered 2019-02-15: 20 mg via INTRAVENOUS
  Administered 2019-02-15 (×2): 30 mg via INTRAVENOUS
  Administered 2019-02-15: 20 mg via INTRAVENOUS

## 2019-02-15 MED ORDER — FENTANYL CITRATE (PF) 100 MCG/2ML IJ SOLN
INTRAMUSCULAR | Status: DC | PRN
  Administered 2019-02-15 (×4): 25 ug via INTRAVENOUS

## 2019-02-15 MED ORDER — PROPOFOL 500 MG/50ML IV EMUL
INTRAVENOUS | Status: AC
  Filled 2019-02-15: qty 50

## 2019-02-15 MED ORDER — LIDOCAINE HCL (CARDIAC) PF 100 MG/5ML IV SOSY
PREFILLED_SYRINGE | INTRAVENOUS | Status: DC | PRN
  Administered 2019-02-15: 50 mg via INTRATRACHEAL

## 2019-02-15 MED ORDER — EPHEDRINE SULFATE 50 MG/ML IJ SOLN
INTRAMUSCULAR | Status: AC
  Filled 2019-02-15: qty 1

## 2019-02-15 MED ORDER — LIDOCAINE HCL (PF) 2 % IJ SOLN
INTRAMUSCULAR | Status: AC
  Filled 2019-02-15: qty 10

## 2019-02-15 MED ORDER — OMEPRAZOLE 20 MG PO CPDR: 20.0000 mg | DELAYED_RELEASE_CAPSULE | Freq: Every day | ORAL | 0 refills | Status: DC

## 2019-02-15 MED ORDER — PHENYLEPHRINE HCL (PRESSORS) 10 MG/ML IV SOLN
INTRAVENOUS | Status: AC
  Filled 2019-02-15: qty 1

## 2019-02-15 MED ORDER — PHENYLEPHRINE HCL (PRESSORS) 10 MG/ML IV SOLN
INTRAVENOUS | Status: DC | PRN
  Administered 2019-02-15: 100 ug via INTRAVENOUS

## 2019-02-15 NOTE — Anesthesia Postprocedure Evaluation (Signed)
Anesthesia Post Note  Patient: Deanna Jacobson  Procedure(s) Performed: COLONOSCOPY WITH PROPOFOL (N/A ) ESOPHAGOGASTRODUODENOSCOPY (EGD) WITH PROPOFOL (N/A )  Patient location during evaluation: Endoscopy Anesthesia Type: General Level of consciousness: awake and alert Pain management: pain level controlled Vital Signs Assessment: post-procedure vital signs reviewed and stable Respiratory status: spontaneous breathing, nonlabored ventilation, respiratory function stable and patient connected to nasal cannula oxygen Cardiovascular status: blood pressure returned to baseline and stable Postop Assessment: no apparent nausea or vomiting Anesthetic complications: no     Last Vitals:  Vitals:   02/15/19 1051 02/15/19 1101  BP: 125/73 135/61  Pulse: 75 70  Resp: 18 13  Temp:    SpO2: 100% 98%    Last Pain:  Vitals:   02/15/19 1101  TempSrc:   PainSc: 0-No pain                 [name] S

## 2019-02-15 NOTE — Anesthesia Post-op Follow-up Note (Signed)
Anesthesia QCDR form completed.        

## 2019-02-15 NOTE — Anesthesia Preprocedure Evaluation (Signed)
Anesthesia Evaluation  Patient identified by MRN, date of birth, ID band Patient awake    Reviewed: Allergy & Precautions, NPO status , Patient's Chart, lab work & pertinent test results, reviewed documented beta blocker date and time   Airway Mallampati: II  TM Distance: >3 FB     Dental  (+) Chipped   Pulmonary former smoker,           Cardiovascular hypertension, Pt. on medications      Neuro/Psych    GI/Hepatic   Endo/Other    Renal/GU Renal disease     Musculoskeletal  (+) Arthritis ,   Abdominal   Peds  Hematology   Anesthesia Other Findings MJ use. EKG ok from 3 yr ago.Chronic pain.  Reproductive/Obstetrics                             Anesthesia Physical Anesthesia Plan  ASA: III  Anesthesia Plan: General   Post-op Pain Management:    Induction: Intravenous  PONV Risk Score and Plan:   Airway Management Planned:   Additional Equipment:   Intra-op Plan:   Post-operative Plan:   Informed Consent: I have reviewed the patients History and Physical, chart, labs and discussed the procedure including the risks, benefits and alternatives for the proposed anesthesia with the patient or authorized representative who has indicated his/her understanding and acceptance.       Plan Discussed with: CRNA  Anesthesia Plan Comments:         Anesthesia Quick Evaluation

## 2019-02-15 NOTE — H&P (Signed)
Deanna Antigua, MD [address], [place], [place], [place], [address] [address], [place], [place], [place], [address] Phone: [phone]  Fax: [phone]  Primary Care Physician:  Donnie Coffin, MD   Pre-Procedure History & Physical: HPI:  Deanna Jacobson is a 63 y.o. adult is here for a colonoscopy and EGD.   Past Medical History:  Diagnosis Date  . Chronic back pain   . Hypertension     Past Surgical History:  Procedure Laterality Date  . BREAST ENHANCEMENT SURGERY    . BREAST SURGERY      Prior to Admission medications   Medication Sig Start Date End Date Taking? Authorizing Provider  amLODipine (NORVASC) 2.5 MG tablet Take 2.5 mg by mouth.   Yes [provider]  celecoxib (CELEBREX) 100 MG capsule Take 1 capsule (100 mg total) by mouth 2 (two) times daily. 12/11/18 06/09/19 Yes Milinda Pointer, MD  gabapentin (NEURONTIN) 600 MG tablet Take 1 tablet (600 mg total) by mouth 3 (three) times daily. 01/09/19 06/08/19 Yes Milinda Pointer, MD  lisinopril (PRINIVIL,ZESTRIL) 20 MG tablet Take 1 tablet (20 mg total) by mouth daily. 10/29/15  Yes Fritzi Mandes, MD  SUPREP BOWEL PREP KIT 17.5-3.13-1.6 GM/177ML SOLN TAKE 354 MLS BY MOUTH ONCE FOR 1 DOSE. 02/01/19  Yes [provider]  traMADol (ULTRAM) 50 MG tablet Take 1-2 tablets (50-100 mg total) by mouth 4 (four) times daily. Must last 30 days 02/14/19 04/15/19 Yes Milinda Pointer, MD  traZODone (DESYREL) 100 MG tablet Take 100 mg by mouth at bedtime.  10/27/15  Yes [provider]  buPROPion (ZYBAN) 150 MG 12 hr tablet PLEASE SEE ATTACHED FOR DETAILED DIRECTIONS 12/25/18   [provider]  cyclobenzaprine (FLEXERIL) 10 MG tablet Take 1 tablet (10 mg total) by mouth every 8 (eight) hours as needed for muscle spasms. Patient not taking: Reported on 02/15/2019 02/14/19 08/13/19  Milinda Pointer, MD    Allergies as of 02/02/2019 - Review Complete 02/01/2019  Allergen Reaction Noted  .  Codeine Nausea And Vomiting 06/29/2015    Family History  Problem Relation Age of Onset  . Cancer Mother   . Hypertension Father     Social History   Socioeconomic History  . Marital status: Single    Spouse name: Not on file  . Number of children: Not on file  . Years of education: Not on file  . Highest education level: Not on file  Occupational History  . Not on file  Social Needs  . Financial resource strain: Not on file  . Food insecurity    Worry: Not on file    Inability: Not on file  . Transportation needs    Medical: Not on file    Non-medical: Not on file  Tobacco Use  . Smoking status: Former Smoker    Packs/day: 1.00    Types: Cigarettes    Quit date: 08/07/2018    Years since quitting: 0.5  . Smokeless tobacco: Never Used  Substance and Sexual Activity  . Alcohol use: Yes    Alcohol/week: 0.0 standard drinks    Comment: daily  drinks 4 beers  . Drug use: Yes    Types: Marijuana    Comment: states she will take a hit off of a joint if anyone has one  . Sexual activity: Not on file  Lifestyle  . Physical activity    Days per week: Not on file    Minutes per session: Not on file  . Stress: Not on  file  Relationships  . Social Herbalist on phone: Not on file    Gets together: Not on file    Attends religious service: Not on file    Active member of club or organization: Not on file    Attends meetings of clubs or organizations: Not on file    Relationship status: Not on file  . Intimate partner violence    Fear of current or ex partner: Not on file    Emotionally abused: Not on file    Physically abused: Not on file    Forced sexual activity: Not on file  Other Topics Concern  . Not on file  Social History Narrative  . Not on file    Review of Systems: See HPI, otherwise negative ROS  Physical Exam: BP (!) 159/70   Pulse 78   Temp 97.8 F (36.6 C) (Tympanic)   Resp 16   Ht _0  (1.549 m)   Wt 51.3 kg   SpO2 99%   BMI  21.35 kg/m  General:   Alert,  pleasant and cooperative in NAD Head:  Normocephalic and atraumatic. Neck:  Supple; no masses or thyromegaly. Lungs:  Clear throughout to auscultation, normal respiratory effort.    Heart:  +S1, +S2, Regular rate and rhythm, No edema. Abdomen:  Soft, nontender and nondistended. Normal bowel sounds, without guarding, and without rebound.   Neurologic:  Alert and  oriented x4;  grossly normal neurologically.  Impression/Plan: Deanna Jacobson is here for a colonoscopy to be performed for average risk screening and EGD for iron deficiency anemia  Risks, benefits, limitations, and alternatives regarding the procedures have been reviewed with the patient.  Questions have been answered.  All parties agreeable.   [name], MD  02/15/2019, 9:34 AM

## 2019-02-15 NOTE — Transfer of Care (Signed)
Immediate Anesthesia Transfer of Care Note  Patient: Deanna Jacobson  Procedure(s) Performed: COLONOSCOPY WITH PROPOFOL (N/A ) ESOPHAGOGASTRODUODENOSCOPY (EGD) WITH PROPOFOL (N/A )  Patient Location: PACU  Anesthesia Type:General  Level of Consciousness: awake, alert  and oriented  Airway & Oxygen Therapy: Patient Spontanous Breathing and Patient connected to nasal cannula oxygen  Post-op Assessment: Report given to RN and Post -op Vital signs reviewed and stable  Post vital signs: Reviewed and stable  Last Vitals:  Vitals Value Taken Time  BP 121/72 02/15/19 1032  Temp 36.3 C 02/15/19 1031  Pulse 72 02/15/19 1033  Resp 15 02/15/19 1033  SpO2 100 % 02/15/19 1033  Vitals shown include unvalidated device data.  Last Pain:  Vitals:   02/15/19 1031  TempSrc: Tympanic  PainSc: Asleep      Patients Stated Pain Goal: 0 (01/56/15 3794)  Complications: No apparent anesthesia complications

## 2019-02-15 NOTE — Op Note (Signed)
[facility] Gastroenterology Patient Name: Milka Windholz Procedure Date: 02/15/2019 9:35 AM MRN: [id number] Account #: [id number] Date of Birth: 19-Feb-1956 Admit Type: Outpatient Age: 63 Room: [facility] ENDO ROOM 4 Gender: Female Note Status: Finalized Procedure:            Colonoscopy Indications:          Screening for colorectal malignant neoplasm Providers:            Lamyah Creed B. Bonna Gains MD, MD Referring MD:         Edmonia Lynch. Aycock MD (Referring MD) Medicines:            Monitored Anesthesia Care Complications:        No immediate complications. Procedure:            Pre-Anesthesia Assessment:                       - ASA Grade Assessment: II - A patient with mild                        systemic disease.                       - Prior to the procedure, a History and Physical was                        performed, and patient medications, allergies and                        sensitivities were reviewed. The patient's tolerance of                        previous anesthesia was reviewed.                       - The risks and benefits of the procedure and the                        sedation options and risks were discussed with the                        patient. All questions were answered and informed                        consent was obtained.                       - Patient identification and proposed procedure were                        verified prior to the procedure by the physician, the                        nurse, the anesthesiologist, the anesthetist and the                        technician. The procedure was verified in the procedure                        room.  After obtaining informed consent, the colonoscope was                        passed under direct vision. Throughout the procedure,                        the patient's blood pressure, pulse, and oxygen                        saturations were monitored continuously. The                        Colonoscope was introduced through the anus and                        advanced to the the cecum, identified by appendiceal                        orifice and ileocecal valve. The colonoscopy was                        performed with ease. The patient tolerated the                        procedure well. The quality of the bowel preparation                        was good. Findings:      The perianal and digital rectal examinations were normal.      Five sessile polyps were found in the sigmoid colon and cecum. The       polyps were 3 to 4 mm in size. These polyps were removed with a cold       biopsy forceps. Resection and retrieval were complete.      The exam was otherwise without abnormality.      The rectum, sigmoid colon, descending colon, transverse colon, ascending       colon and cecum appeared normal.      Anal papilla(e) were hypertrophied.      The retroflexed view of the distal rectum and anal verge was normal and       showed no anal or rectal abnormalities. Impression:           - Five 3 to 4 mm polyps in the sigmoid colon and in the                        cecum, removed with a cold biopsy forceps. Resected and                        retrieved.                       - The examination was otherwise normal.                       - The rectum, sigmoid colon, descending colon,                        transverse colon, ascending colon and cecum are normal.                       -  Anal papilla(e) were hypertrophied.                       - The distal rectum and anal verge are normal on                        retroflexion view. Recommendation:       - Discharge patient to home (with escort).                       - Advance diet as tolerated.                       - Continue present medications.                       - Await pathology results.                       - Repeat colonoscopy in 5 years.                       - The findings and recommendations were  discussed with                        the patient.                       - The findings and recommendations were discussed with                        the patient's family.                       - Return to primary care physician as previously                        scheduled. Procedure Code(s):    --- Professional ---                       [phone], Colonoscopy, flexible; with biopsy, single or                        multiple Diagnosis Code(s):    --- Professional ---                       Z12.11, Encounter for screening for malignant neoplasm                        of colon                       K63.5, Polyp of colon                       K62.89, Other specified diseases of anus and rectum CPT copyright 2019 American Medical Association. All rights reserved. The codes documented in this report are preliminary and upon coder review may  be revised to meet current compliance requirements.  Vonda Antigua, MD Margretta Sidle B. Bonna Gains MD, MD 02/15/2019 10:52:03 AM This report has been signed electronically. Number of Addenda: 0 Note Initiated On: 02/15/2019 9:35 AM Scope Withdrawal Time: 0 hours 18 minutes 59 seconds  Total Procedure Duration: 0 hours  29 minutes 51 seconds  Estimated Blood Loss: Estimated blood loss: none.      [facility]

## 2019-02-15 NOTE — Op Note (Signed)
[facility] Gastroenterology Patient Name: Deanna Jacobson Procedure Date: 02/15/2019 9:36 AM MRN: [id number] Account #: [id number] Date of Birth: [birth year] Admit Type: Outpatient Age: 63 Room: [facility] ENDO ROOM 4 Gender: Female Note Status: Finalized Procedure:            Upper GI endoscopy Indications:          Iron deficiency anemia Providers:            Raistlin Gum B. Bonna Gains MD, MD Referring MD:         Edmonia Lynch. Aycock MD (Referring MD) Medicines:            Monitored Anesthesia Care Complications:        No immediate complications. Procedure:            Pre-Anesthesia Assessment:                       - Prior to the procedure, a History and Physical was                        performed, and patient medications, allergies and                        sensitivities were reviewed. The patient's tolerance of                        previous anesthesia was reviewed.                       - The risks and benefits of the procedure and the                        sedation options and risks were discussed with the                        patient. All questions were answered and informed                        consent was obtained.                       - Patient identification and proposed procedure were                        verified prior to the procedure by the physician, the                        nurse, the anesthesiologist, the anesthetist and the                        technician. The procedure was verified in the procedure                        room.                       - ASA Grade Assessment: II - A patient with mild                        systemic disease.  After obtaining informed consent, the endoscope was                        passed under direct vision. Throughout the procedure,                        the patient's blood pressure, pulse, and oxygen                        saturations were monitored continuously. The Endoscope                  was introduced through the mouth, and advanced to the                        second part of duodenum. The upper GI endoscopy was                        accomplished with ease. The patient tolerated the                        procedure well. Findings:      LA Grade A (one or more mucosal breaks less than 5 mm, not extending       between tops of 2 mucosal folds) esophagitis with no bleeding was found       in the distal esophagus.      The exam of the esophagus was otherwise normal.      Patchy mildly erythematous mucosa without bleeding was found in the       gastric antrum. Biopsies were taken with a cold forceps for histology.       Biopsies were obtained in the gastric body, at the incisura and in the       gastric antrum with cold forceps for histology.      A small hiatal hernia was present.      The duodenal bulb, second portion of the duodenum and examined duodenum       were normal. Biopsies for histology were taken with a cold forceps for       evaluation of celiac disease. Impression:           - LA Grade A reflux esophagitis.                       - Erythematous mucosa in the antrum. Biopsied.                       - Small hiatal hernia.                       - Normal duodenal bulb, second portion of the duodenum                        and examined duodenum. Biopsied.                       - Biopsies were obtained in the gastric body, at the                        incisura and in the gastric antrum. Recommendation:       - Await pathology results.                       -  Discharge patient to home (with escort).                       - Advance diet as tolerated.                       - Continue present medications.                       - Patient has a contact number available for                        emergencies. The signs and symptoms of potential                        delayed complications were discussed with the patient.                        Return to  normal activities tomorrow. Written discharge                        instructions were provided to the patient.                       - Discharge patient to home (with escort).                       - The findings and recommendations were discussed with                        the patient.                       - The findings and recommendations were discussed with                        the patient's family.                       - Follow an antireflux regimen.                       - Take prescribed proton pump inhibitor or H2 blocker                        (antacid) medications 30 - 60 minutes before meals. Procedure Code(s):    --- Professional ---                       [phone], Esophagogastroduodenoscopy, flexible, transoral;                        with biopsy, single or multiple Diagnosis Code(s):    --- Professional ---                       K21.0, Gastro-esophageal reflux disease with esophagitis                       K31.89, Other diseases of stomach and duodenum                       K44.9, Diaphragmatic hernia without obstruction or  gangrene                       D50.9, Iron deficiency anemia, unspecified CPT copyright 2019 American Medical Association. All rights reserved. The codes documented in this report are preliminary and upon coder review may  be revised to meet current compliance requirements.  Vonda Antigua, MD Margretta Sidle B. Bonna Gains MD, MD 02/15/2019 9:55:28 AM This report has been signed electronically. Number of Addenda: 0 Note Initiated On: 02/15/2019 9:36 AM Estimated Blood Loss: Estimated blood loss: none.      [facility]

## 2019-02-16 ENCOUNTER — Encounter: Payer: Self-pay | Admitting: Gastroenterology

## 2019-02-20 ENCOUNTER — Telehealth: Payer: Self-pay

## 2019-02-20 ENCOUNTER — Other Ambulatory Visit: Payer: Self-pay | Admitting: Gastroenterology

## 2019-02-20 MED ORDER — AMOXICILLIN 500 MG PO TABS: 1000.0000 mg | ORAL_TABLET | Freq: Two times a day (BID) | ORAL | 0 refills | Status: AC

## 2019-02-20 MED ORDER — OMEPRAZOLE 20 MG PO CPDR: 20.0000 mg | DELAYED_RELEASE_CAPSULE | Freq: Two times a day (BID) | ORAL | 0 refills | Status: DC

## 2019-02-20 MED ORDER — CLARITHROMYCIN 250 MG PO TABS: 500.0000 mg | ORAL_TABLET | Freq: Two times a day (BID) | ORAL | 0 refills | Status: AC

## 2019-02-20 NOTE — Telephone Encounter (Signed)
-----   Message from [name], MD sent at 02/20/2019  4:13 PM EDT ----- Caryl Pina please let the patient know, biopsies from her stomach showed H. pylori bacteria.  It also showed intestinal metaplasia which is something associated with H. pylori bacteria.  The bacteria needs to be treated and I have sent 2 antibiotics to the pharmacy, along with omeprazole twice daily for 14 days.  After completion of these medications, I would like to see her in clinic in 6 to 8 weeks to repeat H. pylori testing and discuss need for repeat biopsies in the future.

## 2019-02-20 NOTE — Telephone Encounter (Signed)
Patient verbalized understanding and made follow up appointment. Patient will pick up medications tomorrow

## 2019-03-02 LAB — SURGICAL PATHOLOGY

## 2019-03-10 ENCOUNTER — Other Ambulatory Visit: Payer: Self-pay | Admitting: Gastroenterology

## 2019-03-12 NOTE — Telephone Encounter (Signed)
Last office visit 02/01/2019 iron deficiency anemia Last refill  02/20/2019 0 refills

## 2019-03-27 ENCOUNTER — Encounter: Payer: Self-pay | Admitting: Gastroenterology

## 2019-04-10 ENCOUNTER — Encounter: Payer: Self-pay | Admitting: Pain Medicine

## 2019-04-10 NOTE — Progress Notes (Signed)
Pain Management Virtual Encounter Note - Virtual Visit via Telephone Telehealth (real-time audio visits between healthcare provider and patient).   Patient's Phone No. & Preferred Pharmacy:  [phone] (home); There is no such number on file (mobile).; (Preferred) [phone] No e-mail address on record  [id number] [id number] [id number]: [phone]: [phone]   Pre-screening note:  Our staff contacted Mr. TCorralejoand offered him an "in person", "face-to-face" appointment versus a telephone encounter. He indicated preferring the telephone encounter, at this time.   Reason for Virtual Visit: COVID-19*  Social distancing based on CDC and AMA recommendations.   I contacted Deanna Jacobson 04/11/2019 via telephone.      I clearly identified myself as FGaspar Cola MD. I verified that I was speaking with the correct person using two identifiers (Name: Deanna Jacobson and date of birth: [birth year].  Advanced Informed Consent I sought verbal advanced consent from Deanna Jacobson virtual visit interactions. I informed Mr. TForbushof possible security and privacy concerns, risks, and limitations associated with providing "not-in-person" medical evaluation and management services. I also informed Mr. TWintersof the availability of "in-person" appointments. Finally, I informed him that there would be a charge for the virtual visit and that he could be  personally, fully or partially, financially responsible for it. [name] understanding and agreed to proceed.   Historic Elements   [name] a 63y.o. year old, adult patient evaluated today after his last encounter by our practice on 02/14/2019. Deanna Jacobson has a past medical history of Chronic back pain and Hypertension. He also  has a past surgical history that includes Breast enhancement surgery; Breast  surgery; Colonoscopy with propofol (N/A, 02/15/2019); and Esophagogastroduodenoscopy (egd) with propofol (N/A, 02/15/2019). Deanna Jacobson a current medication list which includes the following prescription(s): amlodipine, bupropion, celecoxib, cyclobenzaprine, gabapentin, lisinopril, tramadol, trazodone, and omeprazole. He  reports that he quit smoking about 8 months ago. His smoking use included cigarettes. He smoked 1.00 pack per day. He has never used smokeless tobacco. He reports current alcohol use. He reports current drug use. Drug: Marijuana. [name] allergic to codeine.   HPI  Today, he is being contacted for medication management.  Pharmacotherapy Assessment  Analgesic: Tramadol 50 mg 1 every 6 hours (200 mg/day of tramadol) (Last written in 2018) (D/C'ed due to undisclosed THC on 02/02/2017 UDS) MME/day: 20 mg/day.   Monitoring: Pharmacotherapy: No side-effects or adverse reactions reported. [place] PMP: PDMP reviewed during this encounter.       Compliance: No problems identified. Effectiveness: Clinically acceptable. Plan: Refer to "POC".  UDS:  Summary  Date Value Ref Range Status  02/02/2017 FINAL  Final    Comment:    ==================================================================== TOXASSURE SELECT 13 (MW) ==================================================================== Test                             Result       Flag       Units Drug Present and Declared for Prescription Verification   Tramadol                       >4000        EXPECTED   ng/mg creat   O-Desmethyltramadol            >4000        EXPECTED  ng/mg creat   N-Desmethyltramadol            421          EXPECTED   ng/mg creat    Source of tramadol is a prescription medication.    O-desmethyltramadol and N-desmethyltramadol are expected    metabolites of tramadol. Drug Present not Declared for Prescription Verification   Carboxy-THC                    33           UNEXPECTED ng/mg creat     Carboxy-THC is a metabolite of tetrahydrocannabinol  (THC).    Source of [facility] is most commonly illicit, but THC is also present    in a scheduled prescription medication. ==================================================================== Test                      Result    Flag   Units      Ref Range   Creatinine              125              mg/dL      >=20 ==================================================================== Declared Medications:  The flagging and interpretation on this report are based on the  following declared medications.  Unexpected results may arise from  inaccuracies in the declared medications.  **Note: The testing scope of this panel includes these medications:  Tramadol  **Note: The testing scope of this panel does not include following  reported medications:  Cyclobenzaprine  Gabapentin  Lisinopril  Meloxicam  Trazodone ==================================================================== For clinical consultation, please call [phone]. ====================================================================    Laboratory Chemistry Profile (12 mo)  Renal: 06/13/2018: BUN 10; BUN/Creatinine Ratio 10; Creatinine, Ser 0.98  Lab Results  Component Value Date   GFRAA 71 06/13/2018   GFRNONAA 61 06/13/2018   Hepatic: 06/13/2018: Albumin 3.9 Lab Results  Component Value Date   AST 12 06/13/2018   ALT 12 (L) 02/02/2017   Other: 06/13/2018: 25-Hydroxy, Vitamin D 45; 25-Hydroxy, Vitamin D-2 <1.0; 25-Hydroxy, Vitamin D-3 45; CRP <1; Sed Rate 13; Vitamin B-12 537 Note: Above Lab results reviewed.  Imaging  Last 90 days:  No results found.  Assessment  The primary encounter diagnosis was Chronic pain syndrome. Diagnoses of Chronic thoracic spine pain (Primary Area of Pain) (midline), Lumbar facet syndrome (Bilateral), Chronic pain, Musculoskeletal pain, Neurogenic pain, and Osteoarthritis involving multiple joints were also pertinent to this visit.  Plan  of Care  I have discontinued Deanna Jacobson's Suprep Bowel Prep Kit. I am also having him maintain his traZODone, lisinopril, amLODipine, buPROPion, omeprazole, traMADol, cyclobenzaprine, gabapentin, and celecoxib.  Pharmacotherapy (Medications Ordered): Meds ordered this encounter  Medications  . traMADol (ULTRAM) 50 MG tablet    Sig: Take 1-2 tablets (50-100 mg total) by mouth 4 (four) times daily. Must last 30 days    Dispense:  240 tablet    Refill:  5    Chronic Pain: STOP Act (Not applicable) Fill 1 day early if closed on refill date. Do not fill until: 04/15/2019. To last until: 10/12/2019. Avoid benzodiazepines within 8 hours of opioids  . cyclobenzaprine (FLEXERIL) 10 MG tablet    Sig: Take 1 tablet (10 mg total) by mouth every 8 (eight) hours as needed for muscle spasms.    Dispense:  90 tablet    Refill:  5    Fill one day early if pharmacy is closed on scheduled refill date. May  substitute for generic if available.  . gabapentin (NEURONTIN) 600 MG tablet    Sig: Take 1 tablet (600 mg total) by mouth 3 (three) times daily.    Dispense:  90 tablet    Refill:  5    Fill one day early if pharmacy is closed on scheduled refill date. May substitute for generic if available.  . celecoxib (CELEBREX) 100 MG capsule    Sig: Take 1 capsule (100 mg total) by mouth 2 (two) times daily.    Dispense:  60 capsule    Refill:  5    Fill one day early if pharmacy is closed on scheduled refill date. May substitute for generic if available.   Orders:  No orders of the defined types were placed in this encounter.  Follow-up plan:   Return in about 26 weeks (around 10/10/2019) for (VV), (MM).      Interventional therapies:  Considering: Palliative bilateral T12, L1, L2, & L3 Thoracolumbar Facet MBB #4    Palliative PRN treatment(s): Palliative LeftT12, L1, L2, &L3Thoracolumbar FacetRFA #3(Last done on 01/04/2019) PalliativeRightT12, L1, L2, &L3Thoracolumbar FacetRFA  #2(Last done on 09/27/16)    Recent Visits Date Type Provider Dept  02/14/19 Office Visit Milinda Pointer, MD [facility]  Showing recent visits within past 90 days and meeting all other requirements   Today's Visits Date Type Provider Dept  04/11/19 Telemedicine Milinda Pointer, MD [facility]  Showing today's visits and meeting all other requirements   Future Appointments Date Type Provider Dept  06/06/19 Appointment Milinda Pointer, MD [facility]  Showing future appointments within next 90 days and meeting all other requirements   I discussed the assessment and treatment plan with the patient. The patient was provided an opportunity to ask questions and all were answered. The patient agreed with the plan and demonstrated an understanding of the instructions.  Patient advised to call back or seek an in-person evaluation if the symptoms or condition worsens.  Total duration of non-face-to-face encounter: 12 minutes.  Note by: Gaspar Cola, MD Date: 04/11/2019; Time: 2:03 PM  Note: This dictation was prepared with Dragon dictation. Any transcriptional errors that may result from this process are unintentional.  Disclaimer:  * Given the special circumstances of the COVID-19 pandemic, the federal government has announced that the Office for Civil Rights (OCR) will exercise its enforcement discretion and will not impose penalties on physicians using telehealth in the event of noncompliance with regulatory requirements under the [place] (HIPAA) in connection with the good faith provision of telehealth during the ASTMH-96 national public health emergency. ([place])

## 2019-04-11 ENCOUNTER — Ambulatory Visit: Payer: BLUE CROSS/BLUE SHIELD | Attending: Pain Medicine | Admitting: Pain Medicine

## 2019-04-11 ENCOUNTER — Other Ambulatory Visit: Payer: Self-pay

## 2019-04-11 DIAGNOSIS — M546 Pain in thoracic spine: Secondary | ICD-10-CM

## 2019-04-11 DIAGNOSIS — M7918 Myalgia, other site: Secondary | ICD-10-CM | POA: Diagnosis not present

## 2019-04-11 DIAGNOSIS — M159 Polyosteoarthritis, unspecified: Secondary | ICD-10-CM

## 2019-04-11 DIAGNOSIS — M792 Neuralgia and neuritis, unspecified: Secondary | ICD-10-CM

## 2019-04-11 DIAGNOSIS — G894 Chronic pain syndrome: Secondary | ICD-10-CM | POA: Diagnosis not present

## 2019-04-11 DIAGNOSIS — M47816 Spondylosis without myelopathy or radiculopathy, lumbar region: Secondary | ICD-10-CM | POA: Diagnosis not present

## 2019-04-11 DIAGNOSIS — G8929 Other chronic pain: Secondary | ICD-10-CM

## 2019-04-11 DIAGNOSIS — M8949 Other hypertrophic osteoarthropathy, multiple sites: Secondary | ICD-10-CM

## 2019-04-11 MED ORDER — CELECOXIB 100 MG PO CAPS: 100.0000 mg | ORAL_CAPSULE | Freq: Two times a day (BID) | ORAL | 5 refills | Status: DC

## 2019-04-11 MED ORDER — TRAMADOL HCL 50 MG PO TABS: 50.0000 mg | ORAL_TABLET | Freq: Four times a day (QID) | ORAL | 5 refills | Status: DC

## 2019-04-11 MED ORDER — GABAPENTIN 600 MG PO TABS: 600.0000 mg | ORAL_TABLET | Freq: Three times a day (TID) | ORAL | 5 refills | Status: DC

## 2019-04-11 MED ORDER — CYCLOBENZAPRINE HCL 10 MG PO TABS: 10.0000 mg | ORAL_TABLET | Freq: Three times a day (TID) | ORAL | 5 refills | Status: DC | PRN

## 2019-04-19 ENCOUNTER — Other Ambulatory Visit: Payer: Self-pay

## 2019-04-19 ENCOUNTER — Ambulatory Visit: Payer: BLUE CROSS/BLUE SHIELD | Admitting: Gastroenterology

## 2019-04-19 ENCOUNTER — Ambulatory Visit (INDEPENDENT_AMBULATORY_CARE_PROVIDER_SITE_OTHER): Payer: BLUE CROSS/BLUE SHIELD | Admitting: Gastroenterology

## 2019-04-19 ENCOUNTER — Encounter: Payer: Self-pay | Admitting: Gastroenterology

## 2019-04-19 DIAGNOSIS — D509 Iron deficiency anemia, unspecified: Secondary | ICD-10-CM

## 2019-04-19 DIAGNOSIS — A048 Other specified bacterial intestinal infections: Secondary | ICD-10-CM | POA: Diagnosis not present

## 2019-04-19 NOTE — Patient Instructions (Signed)

## 2019-04-20 NOTE — Progress Notes (Signed)
Deanna Antigua, MD [address]  [place]  [place], [place] [address]  Main: [phone]  Fax: [phone]   Primary Care Physician: Donnie Coffin, MD  Virtual Visit via Telephone Note  I connected with patient on 04/20/19 at  3:30 PM EST by telephone and verified that I am speaking with the correct person using two identifiers.   I discussed the limitations, risks, security and privacy concerns of performing an evaluation and management service by telephone and the availability of in person appointments. I also discussed with the patient that there may be a patient responsible charge related to this service. The patient expressed understanding and agreed to proceed.  Location of Patient: Home Location of Provider: Home Persons involved: Patient and provider only during the visit (nursing staff and front desk staff was involved in communicating with the patient prior to the appointment, reviewing medications and checking them in)   History of Present Illness: CC: Anemia  HPI: Deanna Jacobson is a 63 y.o. adult here for follow up of IDA and h pylori. Feels better after H pylori treatment. The patient denies abdominal or flank pain, anorexia, nausea or vomiting, dysphagia, change in bowel habits or black or bloody stools or weight loss.  Small hiatal hernia and gastric erythema seen on EGD. H pylori seen on biopsies. S/p triple therapy. 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   5 subcentimeter polyps removed from the colon. Repeat recommended in 5 years   Current Outpatient Medications  Medication Sig Dispense Refill  . amLODipine (NORVASC) 2.5 MG tablet Take 2.5 mg by mouth.    Marland Kitchen buPROPion (ZYBAN) 150 MG 12 hr tablet PLEASE SEE ATTACHED FOR DETAILED DIRECTIONS    . celecoxib (CELEBREX) 100 MG capsule Take 1 capsule (100 mg total) by mouth 2 (two) times daily. 60 capsule 5  . cephALEXin (KEFLEX) 500 MG capsule Take 500 mg by mouth every 12 (twelve) hours.     . cyclobenzaprine (FLEXERIL) 10 MG tablet Take 1 tablet (10 mg total) by mouth every 8 (eight) hours as needed for muscle spasms. 90 tablet 5  . gabapentin (NEURONTIN) 600 MG tablet Take 1 tablet (600 mg total) by mouth 3 (three) times daily. 90 tablet 5  . hydrOXYzine (ATARAX/VISTARIL) 25 MG tablet Take 25 mg by mouth 3 (three) times daily as needed.    Marland Kitchen lisinopril (PRINIVIL,ZESTRIL) 20 MG tablet Take 1 tablet (20 mg total) by mouth daily. 30 tablet 2  . omeprazole (PRILOSEC) 20 MG capsule Take 1 capsule (20 mg total) by mouth 2 (two) times daily for 14 days. 28 capsule 0  . predniSONE (STERAPRED UNI-PAK 48 TAB) 10 MG (48) TBPK tablet USE AS DIRECTED FOR 12 DAYS    . traMADol (ULTRAM) 50 MG tablet Take 1-2 tablets (50-100 mg total) by mouth 4 (four) times daily. Must last 30 days 240 tablet 5  . traZODone (DESYREL) 100 MG tablet Take 100 mg by mouth at bedtime.   4   No current facility-administered medications for this visit.     Allergies as of 04/19/2019 - Review Complete 04/19/2019  Allergen Reaction Noted  . Codeine Nausea And Vomiting 06/29/2015    Review of Systems:    All systems reviewed and negative except where noted in HPI.   Observations/Objective:  Labs: CMP     Component Value Date/Time   NA 136 06/13/2018 0945   K 4.2 06/13/2018 0945   CL 98 06/13/2018 0945   CO2 27 02/02/2017 0948  GLUCOSE 66 06/13/2018 0945   GLUCOSE 129 (H) 02/02/2017 0948   BUN 10 06/13/2018 0945   CREATININE 0.98 06/13/2018 0945   CALCIUM 9.2 06/13/2018 0945   PROT 6.2 06/13/2018 0945   ALBUMIN 3.9 06/13/2018 0945   AST 12 06/13/2018 0945   ALT 12 (L) 02/02/2017 0948   ALKPHOS 83 06/13/2018 0945   BILITOT <0.2 06/13/2018 0945   GFRNONAA 61 06/13/2018 0945   GFRAA 71 06/13/2018 0945   Lab Results  Component Value Date   WBC 9.6 02/01/2019   HGB 11.5 02/01/2019   HCT 34.4 02/01/2019   MCV 89 02/01/2019   PLT 274 02/01/2019    Imaging Studies: No results found.   Assessment and Plan:   Deanna Jacobson is a 63 y.o. y/o adult being seen for follow up of IDA and H pylori  Assessment and Plan: Hemoglobin normal on recent check. Continue follow up with PCP/Hematology  Small bowel capsule study recommended and pt would like to schedule at a later time  Need for repeat EGD for gastric mapping discussed and pt would like to wait to schedule till next year. Will set up clinic recall to rediscuss this in 6 months  Will order H pylori breath test as pt is not on PPI and does not want repeat egd at this time  Follow Up Instructions: Follow up in 4-6 months   I discussed the assessment and treatment plan with the patient. The patient was provided an opportunity to ask questions and all were answered. The patient agreed with the plan and demonstrated an understanding of the instructions.   The patient was advised to call back or seek an in-person evaluation if the symptoms worsen or if the condition fails to improve as anticipated.  I provided 15 minutes of non-face-to-face time during this encounter. Additional time was spent in reviewing patient's chart, placing orders etc.   [name], MD  Speech recognition software was used to dictate this note.

## 2019-05-08 ENCOUNTER — Telehealth: Payer: Self-pay | Admitting: Pain Medicine

## 2019-05-08 NOTE — Telephone Encounter (Signed)
Patient called to get a script on Tramadol. I informed her she should have some refills at the pharmacy. She is going to call pharmacy and check.

## 2019-05-09 ENCOUNTER — Encounter: Payer: Self-pay | Admitting: Gastroenterology

## 2019-05-09 ENCOUNTER — Ambulatory Visit (INDEPENDENT_AMBULATORY_CARE_PROVIDER_SITE_OTHER): Payer: BLUE CROSS/BLUE SHIELD | Admitting: Gastroenterology

## 2019-05-09 DIAGNOSIS — Z8619 Personal history of other infectious and parasitic diseases: Secondary | ICD-10-CM

## 2019-05-09 NOTE — Progress Notes (Signed)
Deanna Antigua, MD [address]  [place], [place] [address]  Main: [phone]  Fax: [phone]   Primary Care Physician: Donnie Coffin, MD  Virtual Visit via Telephone Note  I connected with patient on 05/09/19 at  1:45 PM EST by telephone and verified that I am speaking with the correct person using two identifiers.   I discussed the limitations, risks, security and privacy concerns of performing an evaluation and management service by telephone and the availability of in person appointments. I also discussed with the patient that there may be a patient responsible charge related to this service. The patient expressed understanding and agreed to proceed.  Location of Patient: Home Location of Provider: Home Persons involved: Patient and provider only during the visit (nursing staff and front desk staff was involved in communicating with the patient prior to the appointment, reviewing medications and checking them in)   History of Present Illness: Chief Complaint  Patient presents with  . Rash    Patient has rash on both legs. patient states she is itching all over her body. Patient states she know she has a blood infection      HPI: Deanna Jacobson is a 63 y.o. adult: Was in our clinic today just for a lab test for H. pylori breath test for eradication testing.  She requested a televisit to discuss a rash and a blood infection that she states has been going on for 2 years.  I assured her that the H. pylori itself is not a blood infection and I do not know what she is referring to his partners previous blood infections.  She reports diffuse itching and rashes that come and go over the years but she has not had evaluation for this with a dermatologist.  She has previous been seen for iron deficiency anemia and H. pylori.  Small hiatal hernia and gastric erythema seen on EGD and H. pylori seen on biopsies.  Status post triple therapy with eradication testing  pending.  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  Patient does not want to do them now and would want to wait till next year  5 subcentimeter polyps removed in the colon repeat recommended in 5 years.  Current Outpatient Medications  Medication Sig Dispense Refill  . amLODipine (NORVASC) 2.5 MG tablet Take 2.5 mg by mouth.    Marland Kitchen buPROPion (ZYBAN) 150 MG 12 hr tablet PLEASE SEE ATTACHED FOR DETAILED DIRECTIONS    . celecoxib (CELEBREX) 100 MG capsule Take 1 capsule (100 mg total) by mouth 2 (two) times daily. 60 capsule 5  . cyclobenzaprine (FLEXERIL) 10 MG tablet Take 1 tablet (10 mg total) by mouth every 8 (eight) hours as needed for muscle spasms. 90 tablet 5  . gabapentin (NEURONTIN) 600 MG tablet Take 1 tablet (600 mg total) by mouth 3 (three) times daily. 90 tablet 5  . lisinopril (PRINIVIL,ZESTRIL) 20 MG tablet Take 1 tablet (20 mg total) by mouth daily. 30 tablet 2  . omeprazole (PRILOSEC) 20 MG capsule Take 1 capsule (20 mg total) by mouth 2 (two) times daily for 14 days. 28 capsule 0  . traMADol (ULTRAM) 50 MG tablet Take 1-2 tablets (50-100 mg total) by mouth 4 (four) times daily. Must last 30 days 240 tablet 5  . traZODone (DESYREL) 100 MG tablet Take 100 mg by mouth at bedtime.   4   No current facility-administered medications for this visit.  Allergies as of 05/09/2019 - Review Complete 05/09/2019  Allergen Reaction Noted  . Codeine Nausea And Vomiting 06/29/2015    Review of Systems:    All systems reviewed and negative except where noted in HPI.   Observations/Objective:  Labs: CMP     Component Value Date/Time   NA 136 06/13/2018 0945   K 4.2 06/13/2018 0945   CL 98 06/13/2018 0945   CO2 27 02/02/2017 0948   GLUCOSE 66 06/13/2018 0945   GLUCOSE 129 (H) 02/02/2017 0948   BUN 10 06/13/2018 0945   CREATININE 0.98 06/13/2018 0945   CALCIUM 9.2 06/13/2018 0945   PROT 6.2 06/13/2018 0945   ALBUMIN 3.9  06/13/2018 0945   AST 12 06/13/2018 0945   ALT 12 (L) 02/02/2017 0948   ALKPHOS 83 06/13/2018 0945   BILITOT <0.2 06/13/2018 0945   GFRNONAA 61 06/13/2018 0945   GFRAA 71 06/13/2018 0945   Lab Results  Component Value Date   WBC 9.6 02/01/2019   HGB 11.5 02/01/2019   HCT 34.4 02/01/2019   MCV 89 02/01/2019   PLT 274 02/01/2019    Imaging Studies: No results found.  Assessment and Plan:   Deanna Jacobson is a 63 y.o. y/o adult here for follow-up of H. pylori  Assessment and Plan: Eradication testing done today and pending  Gastric mapping biopsies recommended and patient does not want to schedule at this time and would like to wait till next year.  She will call us when she is ready to schedule  I have asked her to follow-up with her primary care doctor and discuss a dermatology referral if she is having intermittent rashes.  I have assured her that H. pylori is not a blood infection as she was assuming and reassured her about this.  Follow Up Instructions: 6 months   I discussed the assessment and treatment plan with the patient. The patient was provided an opportunity to ask questions and all were answered. The patient agreed with the plan and demonstrated an understanding of the instructions.   The patient was advised to call back or seek an in-person evaluation if the symptoms worsen or if the condition fails to improve as anticipated.  I provided 10 minutes of non-face-to-face time during this encounter. Additional time was spent in reviewing patient's chart, placing orders etc.   [name], MD  Speech recognition software was used to dictate this note.

## 2019-05-11 LAB — H. PYLORI BREATH TEST: H pylori Breath Test: NEGATIVE

## 2019-05-14 ENCOUNTER — Telehealth: Payer: Self-pay

## 2019-05-14 NOTE — Telephone Encounter (Signed)
-----   Message from [name], MD sent at 05/14/2019  2:49 PM EST ----- Deanna Jacobson please let the patient know, her repeat H. pylori test was negative which means she has cleared the bacteria.

## 2019-05-14 NOTE — Telephone Encounter (Signed)
Patient verbalized understanding  

## 2019-06-06 ENCOUNTER — Ambulatory Visit: Payer: BLUE CROSS/BLUE SHIELD | Admitting: Pain Medicine

## 2019-10-08 NOTE — Progress Notes (Signed)
Patient: Deanna Jacobson  Service Category: E/M  Provider: Gaspar Cola, MD  DOB: 27-Mar-1956  DOS: 10/10/2019  Location: Office  MRN: [id number]  Setting: Ambulatory outpatient  Referring Provider: Donnie Coffin, MD  Type: Established Patient  Specialty: Interventional Pain Management  PCP: Donnie Coffin, MD  Location: Remote location  Delivery: TeleHealth     Virtual Encounter - Pain Management PROVIDER NOTE: Information contained herein reflects review and annotations entered in association with encounter. Interpretation of such information and data should be left to medically-trained personnel. Information provided to patient can be located elsewhere in the medical record under "Patient Instructions". Document created using STT-dictation technology, any transcriptional errors that may result from process are unintentional.    Contact & Pharmacy Preferred: [phone] Home: [phone] (home) Mobile: There is no such number on file (mobile). E-mail: No e-mail address on record  [id number] [place] [id number]: [phone]: [phone]  Pre-screening  Mr. Soltys offered "in-person" vs "virtual" encounter. He indicated preferring virtual for this encounter.   Reason COVID-19*  Social distancing based on CDC and AMA recommendations.   I contacted TLyzbeth Genrichon 10/10/2019 via telephone.      I clearly identified myself as FGaspar Cola MD. I verified that I was speaking with the correct person using two identifiers (Name: TAeron Lheureux and date of birth: [birth year].  Consent I sought verbal advanced consent from THarvin Hazelfor virtual visit interactions. I informed Mr. TWardropof possible security and privacy concerns, risks, and limitations associated with providing "not-in-person" medical evaluation and management services. I also informed Mr. TBelizaireof the availability  of "in-person" appointments. Finally, I informed him that there would be a charge for the virtual visit and that he could be  personally, fully or partially, financially responsible for it. Mr. TTortoraexpressed understanding and agreed to proceed.   Historic Elements   Mr. TJaclynn Laumannis a 64y.o. year old, adult patient evaluated today after his last contact with our practice on 05/08/2019. Mr. TFuerstenberg has a past medical history of Chronic back pain and Hypertension. He also  has a past surgical history that includes Breast enhancement surgery; Breast surgery; Colonoscopy with propofol (N/A, 02/15/2019); and Esophagogastroduodenoscopy (egd) with propofol (N/A, 02/15/2019). Mr. TFirsthas a current medication list which includes the following prescription(s): amlodipine, [START ON 10/12/2019] celecoxib, [START ON 10/12/2019] cyclobenzaprine, [START ON 10/12/2019] gabapentin, lisinopril, [START ON 10/12/2019] tramadol, trazodone, and omeprazole. He  reports that he quit smoking about 14 months ago. His smoking use included cigarettes. He smoked 1.00 pack per day. He has never used smokeless tobacco. He reports current alcohol use. He reports current drug use. Drug: Marijuana. Mr. TGuessis allergic to codeine.   HPI  Today, he is being contacted for medication management. The patient indicates doing well with the current medication regimen. No adverse reactions or side effects reported to the medications.  Around 01/04/2019, due to the Covid pandemic and the fact that we were limited in terms of what we could offer the patient, she was again started on her tramadol.  However, she remains having had an undisclosed THC result on her 02/02/2017 UDS.  Today we have entered an order to have this repeated to see if we will continue with the tramadol or if we need to sanctioned this prescription again secondary to the persistent use of illicit substances.  Pharmacotherapy Assessment  Analgesic: Tramadol  50 mg 1 every 6 hours (200 mg/day of tramadol) (Undisclosed THC on 02/02/2017 UDS) MME/day: 20 mg/day.   Monitoring: [place] PMP: PDMP reviewed during this encounter.       Pharmacotherapy: No side-effects or adverse reactions reported. Compliance: No problems identified. Effectiveness: Clinically acceptable. Plan: Refer to "POC".  UDS:  Summary  Date Value Ref Range Status  02/02/2017 FINAL  Final    Comment:    ==================================================================== TOXASSURE SELECT 13 (MW) ==================================================================== Test                             Result       Flag       Units Drug Present and Declared for Prescription Verification   Tramadol                       >4000        EXPECTED   ng/mg creat   O-Desmethyltramadol            >4000        EXPECTED   ng/mg creat   N-Desmethyltramadol            421          EXPECTED   ng/mg creat    Source of tramadol is a prescription medication.    O-desmethyltramadol and N-desmethyltramadol are expected    metabolites of tramadol. Drug Present not Declared for Prescription Verification   Carboxy-THC                    33           UNEXPECTED ng/mg creat    Carboxy-THC is a metabolite of tetrahydrocannabinol  (THC).    Source of [facility] is most commonly illicit, but THC is also present    in a scheduled prescription medication. ==================================================================== Test                      Result    Flag   Units      Ref Range   Creatinine              125              mg/dL      >=20 ==================================================================== Declared Medications:  The flagging and interpretation on this report are based on the  following declared medications.  Unexpected results may arise from  inaccuracies in the declared medications.  **Note: The testing scope of this panel includes these medications:  Tramadol  **Note: The testing scope of  this panel does not include following  reported medications:  Cyclobenzaprine  Gabapentin  Lisinopril  Meloxicam  Trazodone ==================================================================== For clinical consultation, please call [phone]. ====================================================================    Laboratory Chemistry Profile   Renal Lab Results  Component Value Date   BUN 10 06/13/2018   CREATININE 0.98 06/13/2018   BCR 10 (L) 06/13/2018   GFRAA 71 06/13/2018   GFRNONAA 61 06/13/2018     Hepatic Lab Results  Component Value Date   AST 12 06/13/2018   ALT 12 (L) 02/02/2017   ALBUMIN 3.9 06/13/2018   ALKPHOS 83 06/13/2018     Electrolytes Lab Results  Component Value Date   NA 136 06/13/2018   K 4.2 06/13/2018   CL 98 06/13/2018   CALCIUM 9.2 06/13/2018   MG 1.9 06/13/2018     Bone Lab Results  Component Value Date  25OHVITD1 45 06/13/2018   25OHVITD2 <1.0 06/13/2018   25OHVITD3 45 06/13/2018     Inflammation (CRP: Acute Phase) (ESR: Chronic Phase) Lab Results  Component Value Date   CRP <1 06/13/2018   ESRSEDRATE 13 06/13/2018       Note: Above Lab results reviewed.  Imaging  DG PAIN CLINIC C-ARM 1-60 MIN NO REPORT Fluoro was used, but no Radiologist interpretation will be provided.  Please refer to "NOTES" tab for provider progress note.  Assessment  The primary encounter diagnosis was Chronic pain syndrome. Diagnoses of Chronic thoracic spine pain (Primary Area of Pain) (midline), Lumbar facet syndrome (Bilateral), Musculoskeletal pain, Neurogenic pain, Osteoarthritis involving multiple joints, and Pharmacologic therapy were also pertinent to this visit.  Plan of Care  Problem-specific:  No problem-specific Assessment & Plan notes found for this encounter.  Mr. Kandy Towery has a current medication list which includes the following long-term medication(s): amlodipine, [START ON 10/12/2019] celecoxib, [START ON  10/12/2019] cyclobenzaprine, [START ON 10/12/2019] gabapentin, lisinopril, [START ON 10/12/2019] tramadol, trazodone, and omeprazole.  Pharmacotherapy (Medications Ordered): Meds ordered this encounter  Medications  . traMADol (ULTRAM) 50 MG tablet    Sig: Take 1-2 tablets (50-100 mg total) by mouth 4 (four) times daily. Must last 30 days    Dispense:  240 tablet    Refill:  5    Chronic Pain: STOP Act (Not applicable) Fill 1 day early if closed on refill date. Do not fill until: 10/12/2019. To last until: 04/09/2020. Avoid benzodiazepines within 8 hours of opioids  . cyclobenzaprine (FLEXERIL) 10 MG tablet    Sig: Take 1 tablet (10 mg total) by mouth every 8 (eight) hours as needed for muscle spasms.    Dispense:  90 tablet    Refill:  5    Fill one day early if pharmacy is closed on scheduled refill date. May substitute for generic if available.  . gabapentin (NEURONTIN) 600 MG tablet    Sig: Take 1 tablet (600 mg total) by mouth 3 (three) times daily.    Dispense:  90 tablet    Refill:  5    Fill one day early if pharmacy is closed on scheduled refill date. May substitute for generic if available.  . celecoxib (CELEBREX) 100 MG capsule    Sig: Take 1 capsule (100 mg total) by mouth 2 (two) times daily.    Dispense:  60 capsule    Refill:  5    Fill one day early if pharmacy is closed on scheduled refill date. May substitute for generic if available.   Orders:  Orders Placed This Encounter  Procedures  . ToxASSURE Select 13 (MW), Urine    Volume: 30 ml(s). Minimum 3 ml of urine is needed. Document temperature of fresh sample. Indications: Long term (current) use of opiate analgesic (L93.790)    Order Specific Question:   Release to patient    Answer:   Immediate   Follow-up plan:   Return in about 26 weeks (around 04/09/2020) for (F2F), (MM).      Interventional therapies:  Considering: Palliative bilateral T12, L1, L2, & L3 Thoracolumbar Facet MBB #4    Palliative PRN  treatment(s): Palliative LeftT12, L1, L2, &L3Thoracolumbar FacetRFA #3(Last done on 01/04/2019) PalliativeRightT12, L1, L2, &L3Thoracolumbar FacetRFA #2(Last done on 09/27/16)     Recent Visits No visits were found meeting these conditions.  Showing recent visits within past 90 days and meeting all other requirements   Today's Visits Date Type Provider Dept  10/10/19 Telemedicine  Milinda Pointer, MD [facility]  Showing today's visits and meeting all other requirements   Future Appointments No visits were found meeting these conditions.  Showing future appointments within next 90 days and meeting all other requirements   I discussed the assessment and treatment plan with the patient. The patient was provided an opportunity to ask questions and all were answered. The patient agreed with the plan and demonstrated an understanding of the instructions.  Patient advised to call back or seek an in-person evaluation if the symptoms or condition worsens.  Duration of encounter: 12 minutes.  Note by: Gaspar Cola, MD Date: 10/10/2019; Time: 8:51 AM

## 2019-10-09 ENCOUNTER — Encounter: Payer: Self-pay | Admitting: Pain Medicine

## 2019-10-09 ENCOUNTER — Other Ambulatory Visit: Payer: Self-pay | Admitting: Pain Medicine

## 2019-10-09 DIAGNOSIS — G894 Chronic pain syndrome: Secondary | ICD-10-CM

## 2019-10-10 ENCOUNTER — Ambulatory Visit: Payer: BLUE CROSS/BLUE SHIELD | Attending: Pain Medicine | Admitting: Pain Medicine

## 2019-10-10 ENCOUNTER — Telehealth: Payer: Self-pay

## 2019-10-10 ENCOUNTER — Other Ambulatory Visit: Payer: Self-pay

## 2019-10-10 DIAGNOSIS — M7918 Myalgia, other site: Secondary | ICD-10-CM

## 2019-10-10 DIAGNOSIS — M792 Neuralgia and neuritis, unspecified: Secondary | ICD-10-CM

## 2019-10-10 DIAGNOSIS — M546 Pain in thoracic spine: Secondary | ICD-10-CM

## 2019-10-10 DIAGNOSIS — G8929 Other chronic pain: Secondary | ICD-10-CM

## 2019-10-10 DIAGNOSIS — Z79899 Other long term (current) drug therapy: Secondary | ICD-10-CM

## 2019-10-10 DIAGNOSIS — M8949 Other hypertrophic osteoarthropathy, multiple sites: Secondary | ICD-10-CM

## 2019-10-10 DIAGNOSIS — G894 Chronic pain syndrome: Secondary | ICD-10-CM

## 2019-10-10 DIAGNOSIS — M47816 Spondylosis without myelopathy or radiculopathy, lumbar region: Secondary | ICD-10-CM

## 2019-10-10 DIAGNOSIS — M159 Polyosteoarthritis, unspecified: Secondary | ICD-10-CM

## 2019-10-10 MED ORDER — GABAPENTIN 600 MG PO TABS: 600.0000 mg | ORAL_TABLET | Freq: Three times a day (TID) | ORAL | 5 refills | Status: DC

## 2019-10-10 MED ORDER — CYCLOBENZAPRINE HCL 10 MG PO TABS: 10.0000 mg | ORAL_TABLET | Freq: Three times a day (TID) | ORAL | 5 refills | Status: DC | PRN

## 2019-10-10 MED ORDER — TRAMADOL HCL 50 MG PO TABS: 50.0000 mg | ORAL_TABLET | Freq: Four times a day (QID) | ORAL | 5 refills | Status: DC

## 2019-10-10 MED ORDER — CELECOXIB 100 MG PO CAPS: 100.0000 mg | ORAL_CAPSULE | Freq: Two times a day (BID) | ORAL | 5 refills | Status: DC

## 2019-10-10 NOTE — Telephone Encounter (Signed)
Patient advised that script for Tramadol was sent to pharmacy, but cannot be filled until 10-12-19.

## 2019-10-10 NOTE — Telephone Encounter (Signed)
Dr. Dossie Arbour didn't refill her tramadol. She is out and the pharmacy said he didn't sent a script.

## 2019-10-22 LAB — TOXASSURE SELECT 13 (MW), URINE

## 2020-01-14 ENCOUNTER — Other Ambulatory Visit: Payer: Self-pay | Admitting: Pain Medicine

## 2020-04-08 ENCOUNTER — Other Ambulatory Visit: Payer: Self-pay | Admitting: Pain Medicine

## 2020-04-09 ENCOUNTER — Encounter: Payer: Self-pay | Admitting: Pain Medicine

## 2020-04-09 ENCOUNTER — Ambulatory Visit: Payer: Medicare Other | Attending: Pain Medicine | Admitting: Pain Medicine

## 2020-04-09 ENCOUNTER — Other Ambulatory Visit: Payer: Self-pay

## 2020-04-09 DIAGNOSIS — F99 Mental disorder, not otherwise specified: Secondary | ICD-10-CM | POA: Diagnosis present

## 2020-04-09 DIAGNOSIS — M8949 Other hypertrophic osteoarthropathy, multiple sites: Secondary | ICD-10-CM | POA: Insufficient documentation

## 2020-04-09 DIAGNOSIS — M792 Neuralgia and neuritis, unspecified: Secondary | ICD-10-CM

## 2020-04-09 DIAGNOSIS — M159 Polyosteoarthritis, unspecified: Secondary | ICD-10-CM

## 2020-04-09 DIAGNOSIS — M7918 Myalgia, other site: Secondary | ICD-10-CM | POA: Diagnosis present

## 2020-04-09 MED ORDER — CYCLOBENZAPRINE HCL 10 MG PO TABS: 10.0000 mg | ORAL_TABLET | Freq: Three times a day (TID) | ORAL | 2 refills | Status: AC | PRN

## 2020-04-09 MED ORDER — GABAPENTIN 600 MG PO TABS: 600.0000 mg | ORAL_TABLET | Freq: Three times a day (TID) | ORAL | 2 refills | Status: DC

## 2020-04-09 MED ORDER — CELECOXIB 100 MG PO CAPS: 100.0000 mg | ORAL_CAPSULE | Freq: Two times a day (BID) | ORAL | 2 refills | Status: DC

## 2020-04-09 NOTE — Progress Notes (Signed)
PROVIDER NOTE: Information contained herein reflects review and annotations entered in association with encounter. Interpretation of such information and data should be left to medically-trained personnel. Information provided to patient can be located elsewhere in the medical record under "Patient Instructions". Document created using STT-dictation technology, any transcriptional errors that may result from process are unintentional.    Patient: Deanna Jacobson  Service Category: E/M  Provider: Gaspar Cola, MD  DOB: 1955-11-15  DOS: 04/09/2020  Specialty: Interventional Pain Management  MRN: [id number]  Setting: Ambulatory outpatient  PCP: Donnie Coffin, MD  Type: Established Patient    Referring Provider: Donnie Coffin, MD  Location: Office  Delivery: Face-to-face     HPI  Mr. Cailie Bosshart, a 64 y.o. year old adult, is here today because of his No primary diagnosis found.. Mr. Mayorga's primary complain today is Back Pain (low) Last encounter: My last encounter with him was on 04/08/2020. Pertinent problems: Mr. Klemmer has Chronic thoracic spine pain (Primary Area of Pain) (midline); Thoracic facet syndrome (Bilateral); Lumbar facet arthropathy ([place]); Musculoskeletal pain; Neurogenic pain; Osteoarthritis; Chronic pain syndrome; Thoracic spondylosis; Lumbar facet syndrome (Bilateral); Lumbar spondylosis; Spondylosis without myelopathy or radiculopathy, thoracolumbar region; Chronic thoracic back pain (Bilateral); Osteoarthritis involving multiple joints; Spondylosis without myelopathy or radiculopathy, lumbosacral region; and Acute postoperative pain on their pertinent problem list. Pain Assessment: Severity of Chronic pain is reported as a 3 /10. Location: Back Lower/left hip. Onset: More than a month ago. Quality: Constant, Burning (stinging). Timing: Constant. Modifying factor(s): tramadol,heat, smokes marijuana, topicals. Vitals:  height is 5' 1" (1.549 m) and weight is 107 lb  (48.5 kg). His oral temperature is 98.3 F (36.8 C). His blood pressure is 115/77 and his pulse is 73. His respiration is 18 and oxygen saturation is 99%.   Reason for encounter: medication management.  The patient indicates doing well with the current medication regimen. No adverse reactions or side effects reported to the medications. Today she comes in indicating that she would like for [place] to send a referral to a psychiatrist to help her with her anxiety, depression, and other psychiatric disorders that she has. We will go ahead and do that for her today. In addition today we have provided the patient only refills on her Celebrex, Neurontin, Flexeril, but because this are nonopioids and we have gone to transferring all of these to the patient's PCP, we will be doing that today. The patient was informed of this and provided with a copy of the letter to the PCP. I will continue to see her on a as needed basis for possible interventional therapies.  RTCB: PRN Nonopioids transferred 04/09/2020: Celebrex, Neurontin, and Flexeril.  Pharmacotherapy Assessment   Analgesic: Tramadol 50 mg 1 every 6 hours (200 mg/day of tramadol) (Undisclosed THC on 02/02/2017 UDS, and 10/17/2019 UDS) MME/day: 20 mg/day.   Monitoring: [place] PMP: PDMP reviewed during this encounter.       Pharmacotherapy: No side-effects or adverse reactions reported. Compliance: No problems identified. Effectiveness: Clinically acceptable.  Hart Rochester, RN  04/09/2020 12:53 PM  Sign when Signing Visit Nursing Pain Medication Assessment:  Safety precautions to be maintained throughout the outpatient stay will include: orient to surroundings, keep bed in low position, maintain call bell within reach at all times, provide assistance with transfer out of bed and ambulation.  Medication Inspection Compliance: Mr. Mahadeo did not comply with our request to bring his pills to be counted. He was reminded that bringing the medication  bottles, even when  empty, is a requirement.  Medication: None brought in. Pill/Patch Count: None available to be counted. Bottle Appearance: No container available. Did not bring bottle(s) to appointment. Filled Date: N/A Last Medication intake:  Today    UDS:  Summary  Date Value Ref Range Status  10/17/2019 Note  Final    Comment:    ==================================================================== [employer] Select 13 (MW) ==================================================================== Test                             Result       Flag       Units Drug Present not Declared for Prescription Verification   Carboxy-THC                    132          UNEXPECTED ng/mg creat    Carboxy-THC is a metabolite of tetrahydrocannabinol (THC). Source of    THC is most commonly herbal marijuana or marijuana-based products,    but THC is also present in a scheduled prescription medication.    Trace amounts of THC can be present in hemp and cannabidiol (CBD)    products. This test is not intended to distinguish between delta-9-    tetrahydrocannabinol, the predominant form of THC in most herbal or    marijuana-based products, and delta-8-tetrahydrocannabinol. Drug Absent but Declared for Prescription Verification   Tramadol                       Not Detected UNEXPECTED ng/mg creat ==================================================================== Test                      Result    Flag   Units      Ref Range   Creatinine              41               mg/dL      >=20 ==================================================================== Declared Medications:  The flagging and interpretation on this report are based on the  following declared medications.  Unexpected results may arise from  inaccuracies in the declared medications.  **Note: The testing scope of this panel includes these medications:  Tramadol  **Note: The testing scope of this panel does not include the  following  reported medications:  Amlodipine  Celecoxib  Cyclobenzaprine  Gabapentin  Lisinopril  Omeprazole  Trazodone ==================================================================== For clinical consultation, please call [phone]. ====================================================================      ROS  Constitutional: Denies any fever or chills Gastrointestinal: No reported hemesis, hematochezia, vomiting, or acute GI distress Musculoskeletal: Denies any acute onset joint swelling, redness, loss of ROM, or weakness Neurological: No reported episodes of acute onset apraxia, aphasia, dysarthria, agnosia, amnesia, paralysis, loss of coordination, or loss of consciousness  Medication Review  amLODipine, celecoxib, cyclobenzaprine, gabapentin, lisinopril, omeprazole, and traZODone  History Review  Allergy: Mr. Saha is allergic to codeine. Drug: Mr. Glendenning  reports current drug use. Drug: Marijuana. Alcohol:  reports current alcohol use. Tobacco:  reports that he quit smoking about 20 months ago. His smoking use included cigarettes. He smoked 1.00 pack per day. He has never used smokeless tobacco. Social: Mr. Henzler  reports that he quit smoking about 20 months ago. His smoking use included cigarettes. He smoked 1.00 pack per day. He has never used smokeless tobacco. He reports current alcohol use. He reports current drug use. Drug: Marijuana. Medical:  has a past medical history of Chronic back pain and Hypertension. Surgical: Mr. Waltrip  has a past surgical history that includes Breast enhancement surgery; Breast surgery; Colonoscopy with propofol (N/A, 02/15/2019); and Esophagogastroduodenoscopy (egd) with propofol (N/A, 02/15/2019). Family: family history includes Cancer in his mother; Hypertension in his father.  Laboratory Chemistry Profile   Renal Lab Results  Component Value Date   BUN 10 06/13/2018   CREATININE 0.98 06/13/2018   BCR 10 (L) 06/13/2018    GFRAA 71 06/13/2018   GFRNONAA 61 06/13/2018     Hepatic Lab Results  Component Value Date   AST 12 06/13/2018   ALT 12 (L) 02/02/2017   ALBUMIN 3.9 06/13/2018   ALKPHOS 83 06/13/2018     Electrolytes Lab Results  Component Value Date   NA 136 06/13/2018   K 4.2 06/13/2018   CL 98 06/13/2018   CALCIUM 9.2 06/13/2018   MG 1.9 06/13/2018     Bone Lab Results  Component Value Date   25OHVITD1 45 06/13/2018   25OHVITD2 <1.0 06/13/2018   25OHVITD3 45 06/13/2018     Inflammation (CRP: Acute Phase) (ESR: Chronic Phase) Lab Results  Component Value Date   CRP <1 06/13/2018   ESRSEDRATE 13 06/13/2018       Note: Above Lab results reviewed.  Recent Imaging Review  DG PAIN CLINIC C-ARM 1-60 MIN NO REPORT Fluoro was used, but no Radiologist interpretation will be provided.  Please refer to "NOTES" tab for provider progress note. Note: Reviewed        Physical Exam  General appearance: Well nourished, well developed, and well hydrated. In no apparent acute distress Mental status: Alert, oriented x 3 (person, place, & time)       Respiratory: No evidence of acute respiratory distress Eyes: PERLA Vitals: BP 115/77   Pulse 73   Temp 98.3 F (36.8 C) (Oral)   Resp 18   Ht 5' 1" (1.549 m)   Wt 107 lb (48.5 kg)   SpO2 99%   BMI 20.22 kg/m  BMI: Estimated body mass index is 20.22 kg/m as calculated from the following:   Height as of this encounter: 5' 1" (1.549 m).   Weight as of this encounter: 107 lb (48.5 kg). Ideal: Female patients must weigh at least 50 kg to calculate ideal body weight  Assessment   Status Diagnosis  Controlled Controlled Controlled 1. Musculoskeletal pain   2. Neurogenic pain   3. Osteoarthritis involving multiple joints   4. Psychiatric disorder      Updated Problems: Problem  Psychiatric Disorder    Plan of Care  Problem-specific:  No problem-specific Assessment & Plan notes found for this encounter.  Mr. Angelika Jerrett  has a current medication list which includes the following long-term medication(s): amlodipine, lisinopril, trazodone, celecoxib, cyclobenzaprine, gabapentin, and omeprazole.  Pharmacotherapy (Medications Ordered): Meds ordered this encounter  Medications  . cyclobenzaprine (FLEXERIL) 10 MG tablet    Sig: Take 1 tablet (10 mg total) by mouth 3 (three) times daily as needed for muscle spasms.    Dispense:  90 tablet    Refill:  2    Fill one day early if pharmacy is closed on scheduled refill date. May substitute for generic, or similar, if available. Void any older refills or prescriptions of this medication.  . gabapentin (NEURONTIN) 600 MG tablet    Sig: Take 1 tablet (600 mg total) by mouth 3 (three) times daily.    Dispense:  90 tablet    Refill:  2    Fill one day early if pharmacy is closed on scheduled refill date. May substitute for generic, or similar, if available. Void any older refills or prescriptions of this medication.  . celecoxib (CELEBREX) 100 MG capsule    Sig: Take 1 capsule (100 mg total) by mouth 2 (two) times daily.    Dispense:  60 capsule    Refill:  2    Fill one day early if pharmacy is closed on scheduled refill date. May substitute for generic, or similar, if available. Void any older refills or prescriptions of this medication.   Orders:  Orders Placed This Encounter  Procedures  . Ambulatory referral to Psychiatry    Referral Priority:   Routine    Referral Type:   Psychiatric    Referral Reason:   Specialty Services Required    Requested Specialty:   Psychiatry    Number of Visits Requested:   1   Follow-up plan:   Return if symptoms worsen or fail to improve.      Interventional therapies:  Considering: Palliative bilateral T12, L1, L2, & L3 Thoracolumbar Facet MBB #4    Palliative PRN treatment(s): Palliative LeftT12, L1, L2, &L3Thoracolumbar FacetRFA #3(Last done on 01/04/2019) PalliativeRightT12, L1, L2, &L3Thoracolumbar  FacetRFA #2(Last done on 09/27/16)    Recent Visits No visits were found meeting these conditions. Showing recent visits within past 90 days and meeting all other requirements Today's Visits Date Type Provider Dept  04/09/20 Office Visit Milinda Pointer, MD [facility]  Showing today's visits and meeting all other requirements Future Appointments No visits were found meeting these conditions. Showing future appointments within next 90 days and meeting all other requirements  I discussed the assessment and treatment plan with the patient. The patient was provided an opportunity to ask questions and all were answered. The patient agreed with the plan and demonstrated an understanding of the instructions.  Patient advised to call back or seek an in-person evaluation if the symptoms or condition worsens.  Duration of encounter: 30 minutes.  Note by: Gaspar Cola, MD Date: 04/09/2020; Time: 1:39 PM

## 2020-04-09 NOTE — Progress Notes (Signed)
Nursing Pain Medication Assessment:  Safety precautions to be maintained throughout the outpatient stay will include: orient to surroundings, keep bed in low position, maintain call bell within reach at all times, provide assistance with transfer out of bed and ambulation.  Medication Inspection Compliance: Deanna Jacobson did not comply with our request to bring his pills to be counted. He was reminded that bringing the medication bottles, even when empty, is a requirement.  Medication: None brought in. Pill/Patch Count: None available to be counted. Bottle Appearance: No container available. Did not bring bottle(s) to appointment. Filled Date: N/A Last Medication intake:  Today

## 2020-04-09 NOTE — Patient Instructions (Signed)
____________________________________________________________________________________________  CBD (cannabidiol) WARNING  Applicable to: All individuals currently taking or considering taking CBD (cannabidiol) and, more important, all patients taking opioid analgesic controlled substances (pain medication). (Example: oxycodone; oxymorphone; hydrocodone; hydromorphone; morphine; methadone; tramadol; tapentadol; fentanyl; buprenorphine; butorphanol; dextromethorphan; meperidine; codeine; etc.)  Legal status: CBD remains a Schedule I drug prohibited for any use. CBD is illegal with one exception. In the [place], CBD has a limited Food and Drug Administration (FDA) approval for the treatment of two specific types of epilepsy disorders. Only one CBD product has been approved by the FDA for this purpose: "Epidiolex". FDA is aware that some companies are marketing products containing cannabis and cannabis-derived compounds in ways that violate the Federal Food, Drug and Cosmetic Act (FD&C Act) and that may put the health and safety of consumers at risk. The FDA, a Federal agency, has not enforced the CBD status since 2018.   Legality: Some manufacturers ship CBD products nationally, which is illegal. Often such products are sold online and are therefore available throughout the country. CBD is openly sold in head shops and health food stores in some states where such sales have not been explicitly legalized. Selling unapproved products with unsubstantiated therapeutic claims is not only a violation of the law, but also can put patients at risk, as these products have not been proven to be safe or effective. Federal illegality makes it difficult to conduct research on CBD.  Reference: "FDA Regulation of Cannabis and Cannabis-Derived Products, Including Cannabidiol (CBD)" -  https://www.fda.gov/news-events/public-health-focus/fda-regulation-cannabis-and-cannabis-derived-products-including-cannabidiol-cbd  Warning: CBD is not FDA approved and has not undergo the same manufacturing controls as prescription drugs.  This means that the purity and safety of available CBD may be questionable. Most of the time, despite manufacturer's claims, it is contaminated with THC (delta-9-tetrahydrocannabinol - the chemical in marijuana responsible for the "HIGH").  When this is the case, the THC contaminant will trigger a positive urine drug screen (UDS) test for Marijuana (carboxy-THC). Because a positive UDS for any illicit substance is a violation of our medication agreement, your opioid analgesics (pain medicine) may be permanently discontinued.  MORE ABOUT CBD  General Information: CBD  is a derivative of the Marijuana (cannabis sativa) plant discovered in 1940. It is one of the 113 identified substances found in Marijuana. It accounts for up to 40% of the plant's extract. As of 2018, preliminary clinical studies on CBD included research for the treatment of anxiety, movement disorders, and pain. CBD is available and consumed in multiple forms, including inhalation of smoke or vapor, as an aerosol spray, and by mouth. It may be supplied as an oil containing CBD, capsules, dried cannabis, or as a liquid solution. CBD is thought not to be as psychoactive as THC (delta-9-tetrahydrocannabinol - the chemical in marijuana responsible for the "HIGH"). Studies suggest that CBD may interact with different biological target receptors in the body, including cannabinoid and other neurotransmitter receptors. As of 2018 the mechanism of action for its biological effects has not been determined.  Side-effects  Adverse reactions: Dry mouth, diarrhea, decreased appetite, fatigue, drowsiness, malaise, weakness, sleep disturbances, and others.  Drug interactions: CBC may interact with other medications  such as blood-thinners. (Last update: 01/05/2020) ____________________________________________________________________________________________   ____________________________________________________________________________________________  Medication Rules  Purpose: To inform patients, and their family members, of our rules and regulations.  Applies to: All patients receiving prescriptions (written or electronic).  Pharmacy of record: Pharmacy where electronic prescriptions will be sent. If written prescriptions are taken to a different pharmacy, please inform   the nursing staff. The pharmacy listed in the electronic medical record should be the one where you would like electronic prescriptions to be sent.  Electronic prescriptions: In compliance with the [place] Strengthen Opioid Misuse Prevention (STOP) Act of 2017 (Session Law 2017-74/H243), effective May 31, 2018, all controlled substances must be electronically prescribed. Calling prescriptions to the pharmacy will cease to exist.  Prescription refills: Only during scheduled appointments. Applies to all prescriptions.  NOTE: The following applies primarily to controlled substances (Opioid* Pain Medications).   Type of encounter (visit): For patients receiving controlled substances, face-to-face visits are required. (Not an option or up to the patient.)  Patient's responsibilities: 1. Pain Pills: Bring all pain pills to every appointment (except for procedure appointments). 2. Pill Bottles: Bring pills in original pharmacy bottle. Always bring the newest bottle. Bring bottle, even if empty. 3. Medication refills: You are responsible for knowing and keeping track of what medications you take and those you need refilled. The day before your appointment: write a list of all prescriptions that need to be refilled. The day of the appointment: give the list to the admitting nurse. Prescriptions will be written only during  appointments. No prescriptions will be written on procedure days. If you forget a medication: it will not be "Called in", "Faxed", or "electronically sent". You will need to get another appointment to get these prescribed. No early refills. Do not call asking to have your prescription filled early. 4. Prescription Accuracy: You are responsible for carefully inspecting your prescriptions before leaving our office. Have the discharge nurse carefully go over each prescription with you, before taking them home. Make sure that your name is accurately spelled, that your address is correct. Check the name and dose of your medication to make sure it is accurate. Check the number of pills, and the written instructions to make sure they are clear and accurate. Make sure that you are given enough medication to last until your next medication refill appointment. 5. Taking Medication: Take medication as prescribed. When it comes to controlled substances, taking less pills or less frequently than prescribed is permitted and encouraged. Never take more pills than instructed. Never take medication more frequently than prescribed.  6. Inform other Doctors: Always inform, all of your healthcare providers, of all the medications you take. 7. Pain Medication from other Providers: You are not allowed to accept any additional pain medication from any other Doctor or Healthcare provider. There are two exceptions to this rule. (see below) In the event that you require additional pain medication, you are responsible for notifying us, as stated below. 8. Medication Agreement: You are responsible for carefully reading and following our Medication Agreement. This must be signed before receiving any prescriptions from our practice. Safely store a copy of your signed Agreement. Violations to the Agreement will result in no further prescriptions. (Additional copies of our Medication Agreement are available upon request.) 9. Laws, Rules,  & Regulations: All patients are expected to follow all Federal and State Laws, Statutes, Rules, & Regulations. Ignorance of the Laws does not constitute a valid excuse.  10. Illegal drugs and Controlled Substances: The use of illegal substances (including, but not limited to marijuana and its derivatives) and/or the illegal use of any controlled substances is strictly prohibited. Violation of this rule may result in the immediate and permanent discontinuation of any and all prescriptions being written by our practice. The use of any illegal substances is prohibited. 11. Adopted CDC guidelines & recommendations: Target dosing   levels will be at or below 60 MME/day. Use of benzodiazepines** is not recommended.  Exceptions: There are only two exceptions to the rule of not receiving pain medications from other Healthcare Providers. 1. Exception #1 (Emergencies): In the event of an emergency (i.e.: accident requiring emergency care), you are allowed to receive additional pain medication. However, you are responsible for: As soon as you are able, call our office [phone], at any time of the day or night, and leave a message stating your name, the date and nature of the emergency, and the name and dose of the medication prescribed. In the event that your call is answered by a member of our staff, make sure to document and save the date, time, and the name of the person that took your information.  2. Exception #2 (Planned Surgery): In the event that you are scheduled by another doctor or dentist to have any type of surgery or procedure, you are allowed (for a period no longer than 30 days), to receive additional pain medication, for the acute post-op pain. However, in this case, you are responsible for picking up a copy of our "Post-op Pain Management for Surgeons" handout, and giving it to your surgeon or dentist. This document is available at our office, and does not require an appointment to obtain it. Simply  go to our office during business hours (Monday-Thursday from 8:00 AM to 4:00 PM) (Friday 8:00 AM to 12:00 Noon) or if you have a scheduled appointment with us, prior to your surgery, and ask for it by name. In addition, you are responsible for: calling our office [phone], at any time of the day or night, and leaving a message stating your name, name of your surgeon, type of surgery, and date of procedure or surgery. Failure to comply with your responsibilities may result in termination of therapy involving the controlled substances.  *Opioid medications include: morphine, codeine, oxycodone, oxymorphone, hydrocodone, hydromorphone, meperidine, tramadol, tapentadol, buprenorphine, fentanyl, methadone. **Benzodiazepine medications include: diazepam (Valium), alprazolam (Xanax), clonazepam (Klonopine), lorazepam (Ativan), clorazepate (Tranxene), chlordiazepoxide (Librium), estazolam (Prosom), oxazepam (Serax), temazepam (Restoril), triazolam (Halcion) (Last updated: 02/05/2020) ____________________________________________________________________________________________   ____________________________________________________________________________________________  Medication Recommendations and Reminders  Applies to: All patients receiving prescriptions (written and/or electronic).  Medication Rules & Regulations: These rules and regulations exist for your safety and that of others. They are not flexible and neither are we. Dismissing or ignoring them will be considered "non-compliance" with medication therapy, resulting in complete and irreversible termination of such therapy. (See document titled "Medication Rules" for more details.) In all conscience, because of safety reasons, we cannot continue providing a therapy where the patient does not follow instructions.  Pharmacy of record:   Definition: This is the pharmacy where your electronic prescriptions will be sent.   We do not endorse any  particular pharmacy, however, we have experienced problems with [facility] not securing enough medication supply for the community.  We do not restrict you in your choice of pharmacy. However, once we write for your prescriptions, we will NOT be re-sending more prescriptions to fix restricted supply problems created by your pharmacy, or your insurance.   The pharmacy listed in the electronic medical record should be the one where you want electronic prescriptions to be sent.  If you choose to change pharmacy, simply notify our nursing staff.  Recommendations:  Keep all of your pain medications in a safe place, under lock and key, even if you live alone. We will NOT replace lost, stolen, or   damaged medication.  After you fill your prescription, take 1 week's worth of pills and put them away in a safe place. You should keep a separate, properly labeled bottle for this purpose. The remainder should be kept in the original bottle. Use this as your primary supply, until it runs out. Once it's gone, then you know that you have 1 week's worth of medicine, and it is time to come in for a prescription refill. If you do this correctly, it is unlikely that you will ever run out of medicine.  To make sure that the above recommendation works, it is very important that you make sure your medication refill appointments are scheduled at least 1 week before you run out of medicine. To do this in an effective manner, make sure that you do not leave the office without scheduling your next medication management appointment. Always ask the nursing staff to show you in your prescription , when your medication will be running out. Then arrange for the receptionist to get you a return appointment, at least 7 days before you run out of medicine. Do not wait until you have 1 or 2 pills left, to come in. This is very poor planning and does not take into consideration that we may need to cancel appointments due to bad weather,  sickness, or emergencies affecting our staff.  DO NOT ACCEPT A "Partial Fill": If for any reason your pharmacy does not have enough pills/tablets to completely fill or refill your prescription, do not allow for a "partial fill". The law allows the pharmacy to complete that prescription within 72 hours, without requiring a new prescription. If they do not fill the rest of your prescription within those 72 hours, you will need a separate prescription to fill the remaining amount, which we will NOT provide. If the reason for the partial fill is your insurance, you will need to talk to the pharmacist about payment alternatives for the remaining tablets, but again, DO NOT ACCEPT A PARTIAL FILL, unless you can trust your pharmacist to obtain the remainder of the pills within 72 hours.  Prescription refills and/or changes in medication(s):   Prescription refills, and/or changes in dose or medication, will be conducted only during scheduled medication management appointments. (Applies to both, written and electronic prescriptions.)  No refills on procedure days. No medication will be changed or started on procedure days. No changes, adjustments, and/or refills will be conducted on a procedure day. Doing so will interfere with the diagnostic portion of the procedure.  No phone refills. No medications will be "called into the pharmacy".  No Fax refills.  No weekend refills.  No Holliday refills.  No after hours refills.  Remember:  Business hours are:  Monday to Thursday 8:00 AM to 4:00 PM Provider's Schedule: Israel Wunder, MD - Appointments are:  Medication management: Monday and Wednesday 8:00 AM to 4:00 PM Procedure day: Tuesday and Thursday 7:30 AM to 4:00 PM Bilal Lateef, MD - Appointments are:  Medication management: Tuesday and Thursday 8:00 AM to 4:00 PM Procedure day: Monday and Wednesday 7:30 AM to 4:00 PM (Last update:  12/19/2019) ____________________________________________________________________________________________    

## 2020-07-17 ENCOUNTER — Other Ambulatory Visit: Payer: Self-pay | Admitting: Pain Medicine

## 2020-07-17 DIAGNOSIS — M792 Neuralgia and neuritis, unspecified: Secondary | ICD-10-CM

## 2021-10-06 ENCOUNTER — Other Ambulatory Visit: Payer: Self-pay | Admitting: Internal Medicine

## 2021-10-06 DIAGNOSIS — Z1231 Encounter for screening mammogram for malignant neoplasm of breast: Secondary | ICD-10-CM

## 2021-11-23 ENCOUNTER — Ambulatory Visit
Admission: RE | Admit: 2021-11-23 | Discharge: 2021-11-23 | Disposition: A | Discharge status: Home / Self Care | Payer: Medicare Other | Source: Ambulatory Visit | Attending: Physician Assistant | Admitting: Physician Assistant

## 2021-11-23 ENCOUNTER — Other Ambulatory Visit: Payer: Self-pay | Admitting: Physician Assistant

## 2021-11-23 ENCOUNTER — Ambulatory Visit
Admission: RE | Admit: 2021-11-23 | Discharge: 2021-11-23 | Disposition: A | Discharge status: Home / Self Care | Payer: Medicare Other | Attending: Physician Assistant | Admitting: Physician Assistant

## 2021-11-23 DIAGNOSIS — R059 Cough, unspecified: Secondary | ICD-10-CM

## 2021-11-25 ENCOUNTER — Other Ambulatory Visit: Payer: Self-pay | Admitting: Family Medicine

## 2021-11-25 ENCOUNTER — Ambulatory Visit: Admission: RE | Admit: 2021-11-25 | Payer: Medicare Other | Source: Home / Self Care

## 2021-11-25 DIAGNOSIS — R918 Other nonspecific abnormal finding of lung field: Secondary | ICD-10-CM

## 2021-12-02 ENCOUNTER — Ambulatory Visit
Admission: RE | Admit: 2021-12-02 | Discharge: 2021-12-02 | Disposition: A | Discharge status: Home / Self Care | Payer: Medicare Other | Source: Ambulatory Visit | Attending: Family Medicine | Admitting: Family Medicine

## 2021-12-02 DIAGNOSIS — R918 Other nonspecific abnormal finding of lung field: Secondary | ICD-10-CM | POA: Insufficient documentation

## 2021-12-02 DIAGNOSIS — C3402 Malignant neoplasm of left main bronchus: Secondary | ICD-10-CM | POA: Diagnosis not present

## 2021-12-02 DIAGNOSIS — E871 Hypo-osmolality and hyponatremia: Secondary | ICD-10-CM | POA: Diagnosis not present

## 2021-12-02 LAB — POCT I-STAT CREATININE: Creatinine, Ser: 0.7 mg/dL (ref 0.44–1.00)

## 2021-12-02 MED ORDER — IOHEXOL 300 MG/ML  SOLN
75.0000 mL | Freq: Once | INTRAMUSCULAR | Status: AC | PRN
  Administered 2021-12-02: 75 mL via INTRAVENOUS

## 2021-12-03 ENCOUNTER — Other Ambulatory Visit: Payer: Self-pay

## 2021-12-03 ENCOUNTER — Emergency Department: Payer: Medicare Other

## 2021-12-03 ENCOUNTER — Inpatient Hospital Stay
Admission: EM | Admit: 2021-12-03 | Discharge: 2021-12-16 | DRG: 166 | Disposition: A | Discharge status: Home / Self Care | Payer: Medicare Other | Attending: Internal Medicine | Admitting: Internal Medicine

## 2021-12-03 DIAGNOSIS — M1990 Unspecified osteoarthritis, unspecified site: Secondary | ICD-10-CM | POA: Diagnosis present

## 2021-12-03 DIAGNOSIS — C7931 Secondary malignant neoplasm of brain: Secondary | ICD-10-CM | POA: Diagnosis present

## 2021-12-03 DIAGNOSIS — F419 Anxiety disorder, unspecified: Secondary | ICD-10-CM | POA: Diagnosis present

## 2021-12-03 DIAGNOSIS — F32A Depression, unspecified: Secondary | ICD-10-CM | POA: Diagnosis present

## 2021-12-03 DIAGNOSIS — Z681 Body mass index (BMI) 19 or less, adult: Secondary | ICD-10-CM | POA: Diagnosis not present

## 2021-12-03 DIAGNOSIS — I700 Atherosclerosis of aorta: Secondary | ICD-10-CM | POA: Diagnosis present

## 2021-12-03 DIAGNOSIS — Z20822 Contact with and (suspected) exposure to covid-19: Secondary | ICD-10-CM | POA: Diagnosis present

## 2021-12-03 DIAGNOSIS — C771 Secondary and unspecified malignant neoplasm of intrathoracic lymph nodes: Secondary | ICD-10-CM | POA: Diagnosis present

## 2021-12-03 DIAGNOSIS — G894 Chronic pain syndrome: Secondary | ICD-10-CM | POA: Diagnosis present

## 2021-12-03 DIAGNOSIS — C3402 Malignant neoplasm of left main bronchus: Principal | ICD-10-CM | POA: Diagnosis present

## 2021-12-03 DIAGNOSIS — Z79899 Other long term (current) drug therapy: Secondary | ICD-10-CM

## 2021-12-03 DIAGNOSIS — K2100 Gastro-esophageal reflux disease with esophagitis, without bleeding: Secondary | ICD-10-CM | POA: Diagnosis present

## 2021-12-03 DIAGNOSIS — Z809 Family history of malignant neoplasm, unspecified: Secondary | ICD-10-CM

## 2021-12-03 DIAGNOSIS — E871 Hypo-osmolality and hyponatremia: Secondary | ICD-10-CM | POA: Diagnosis present

## 2021-12-03 DIAGNOSIS — Z87891 Personal history of nicotine dependence: Secondary | ICD-10-CM

## 2021-12-03 DIAGNOSIS — J440 Chronic obstructive pulmonary disease with acute lower respiratory infection: Secondary | ICD-10-CM | POA: Diagnosis present

## 2021-12-03 DIAGNOSIS — R0602 Shortness of breath: Secondary | ICD-10-CM | POA: Diagnosis not present

## 2021-12-03 DIAGNOSIS — J189 Pneumonia, unspecified organism: Secondary | ICD-10-CM | POA: Diagnosis present

## 2021-12-03 DIAGNOSIS — E440 Moderate protein-calorie malnutrition: Secondary | ICD-10-CM | POA: Diagnosis present

## 2021-12-03 DIAGNOSIS — E222 Syndrome of inappropriate secretion of antidiuretic hormone: Secondary | ICD-10-CM | POA: Diagnosis present

## 2021-12-03 DIAGNOSIS — Z885 Allergy status to narcotic agent status: Secondary | ICD-10-CM

## 2021-12-03 DIAGNOSIS — I871 Compression of vein: Secondary | ICD-10-CM | POA: Diagnosis present

## 2021-12-03 DIAGNOSIS — D509 Iron deficiency anemia, unspecified: Secondary | ICD-10-CM | POA: Diagnosis present

## 2021-12-03 DIAGNOSIS — I3139 Other pericardial effusion (noninflammatory): Secondary | ICD-10-CM | POA: Diagnosis present

## 2021-12-03 DIAGNOSIS — F99 Mental disorder, not otherwise specified: Secondary | ICD-10-CM

## 2021-12-03 DIAGNOSIS — Z515 Encounter for palliative care: Secondary | ICD-10-CM

## 2021-12-03 DIAGNOSIS — J9811 Atelectasis: Secondary | ICD-10-CM | POA: Diagnosis present

## 2021-12-03 DIAGNOSIS — M549 Dorsalgia, unspecified: Secondary | ICD-10-CM | POA: Diagnosis present

## 2021-12-03 DIAGNOSIS — R5381 Other malaise: Secondary | ICD-10-CM | POA: Diagnosis not present

## 2021-12-03 DIAGNOSIS — M899 Disorder of bone, unspecified: Secondary | ICD-10-CM | POA: Diagnosis present

## 2021-12-03 DIAGNOSIS — D638 Anemia in other chronic diseases classified elsewhere: Secondary | ICD-10-CM | POA: Diagnosis present

## 2021-12-03 DIAGNOSIS — Z66 Do not resuscitate: Secondary | ICD-10-CM | POA: Diagnosis not present

## 2021-12-03 DIAGNOSIS — I2510 Atherosclerotic heart disease of native coronary artery without angina pectoris: Secondary | ICD-10-CM | POA: Diagnosis present

## 2021-12-03 DIAGNOSIS — C3490 Malignant neoplasm of unspecified part of unspecified bronchus or lung: Secondary | ICD-10-CM | POA: Diagnosis not present

## 2021-12-03 DIAGNOSIS — Z79891 Long term (current) use of opiate analgesic: Secondary | ICD-10-CM

## 2021-12-03 DIAGNOSIS — N179 Acute kidney failure, unspecified: Secondary | ICD-10-CM | POA: Diagnosis present

## 2021-12-03 DIAGNOSIS — I10 Essential (primary) hypertension: Secondary | ICD-10-CM | POA: Diagnosis present

## 2021-12-03 DIAGNOSIS — R630 Anorexia: Secondary | ICD-10-CM | POA: Diagnosis present

## 2021-12-03 DIAGNOSIS — J9859 Other diseases of mediastinum, not elsewhere classified: Secondary | ICD-10-CM | POA: Diagnosis not present

## 2021-12-03 DIAGNOSIS — K449 Diaphragmatic hernia without obstruction or gangrene: Secondary | ICD-10-CM | POA: Diagnosis present

## 2021-12-03 DIAGNOSIS — Z8249 Family history of ischemic heart disease and other diseases of the circulatory system: Secondary | ICD-10-CM

## 2021-12-03 LAB — BASIC METABOLIC PANEL
Anion gap: 9 (ref 5–15)
BUN: 14 mg/dL (ref 8–23)
CO2: 25 mmol/L (ref 22–32)
Calcium: 8.8 mg/dL — ABNORMAL LOW (ref 8.9–10.3)
Chloride: 92 mmol/L — ABNORMAL LOW (ref 98–111)
Creatinine, Ser: 0.73 mg/dL (ref 0.44–1.00)
GFR, Estimated: >60 mL/min (ref >60)
Glucose, Bld: 114 mg/dL — ABNORMAL HIGH (ref 70–99)
Potassium: 3.8 mmol/L (ref 3.5–5.1)
Sodium: 126 mmol/L — ABNORMAL LOW (ref 135–145)

## 2021-12-03 LAB — CBC
HCT: 29.5 % — ABNORMAL LOW (ref 36.0–46.0)
Hemoglobin: 9.3 g/dL — ABNORMAL LOW (ref 12.0–15.0)
MCH: 22.4 pg — ABNORMAL LOW (ref 26.0–34.0)
MCHC: 31.5 g/dL (ref 30.0–36.0)
MCV: 70.9 fL — ABNORMAL LOW (ref 80.0–100.0)
Platelets: 472 10*3/uL — ABNORMAL HIGH (ref 150–400)
RBC: 4.16 MIL/uL (ref 3.87–5.11)
RDW: 17.2 % — ABNORMAL HIGH (ref 11.5–15.5)
WBC: 8.8 10*3/uL (ref 4.0–10.5)
nRBC: 0 % (ref 0.0–0.2)

## 2021-12-03 LAB — TROPONIN I (HIGH SENSITIVITY)
Troponin I (High Sensitivity): 12 ng/L (ref <18)
Troponin I (High Sensitivity): 13 ng/L (ref <18)

## 2021-12-03 LAB — PROCALCITONIN: Procalcitonin: 7.1 ng/mL

## 2021-12-03 LAB — SARS CORONAVIRUS 2 BY RT PCR: SARS Coronavirus 2 by RT PCR: NEGATIVE

## 2021-12-03 MED ORDER — SODIUM CHLORIDE 0.9% FLUSH
3.0000 mL | Freq: Two times a day (BID) | INTRAVENOUS | Status: DC
  Administered 2021-12-03 – 2021-12-16 (×23): 3 mL via INTRAVENOUS

## 2021-12-03 MED ORDER — HYDRALAZINE HCL 20 MG/ML IJ SOLN
10.0000 mg | INTRAMUSCULAR | Status: DC | PRN
  Administered 2021-12-03 – 2021-12-09 (×5): 10 mg via INTRAVENOUS
  Filled 2021-12-03 (×6): qty 1

## 2021-12-03 MED ORDER — DOCUSATE SODIUM 100 MG PO CAPS
100.0000 mg | ORAL_CAPSULE | Freq: Two times a day (BID) | ORAL | Status: DC
  Administered 2021-12-03 – 2021-12-15 (×20): 100 mg via ORAL
  Filled 2021-12-03 (×24): qty 1

## 2021-12-03 MED ORDER — GUAIFENESIN ER 600 MG PO TB12
600.0000 mg | ORAL_TABLET | Freq: Two times a day (BID) | ORAL | Status: DC | PRN
  Administered 2021-12-11 (×2): 600 mg via ORAL
  Filled 2021-12-03 (×4): qty 1

## 2021-12-03 MED ORDER — OXYCODONE-ACETAMINOPHEN 5-325 MG PO TABS
1.0000 | ORAL_TABLET | Freq: Once | ORAL | Status: AC
  Administered 2021-12-03: 1 via ORAL
  Filled 2021-12-03: qty 1

## 2021-12-03 MED ORDER — ACETAMINOPHEN 650 MG RE SUPP: 650.0000 mg | Freq: Four times a day (QID) | RECTAL | Status: DC | PRN

## 2021-12-03 MED ORDER — NICOTINE 14 MG/24HR TD PT24
14.0000 mg | MEDICATED_PATCH | Freq: Every day | TRANSDERMAL | Status: DC
  Filled 2021-12-03 (×3): qty 1

## 2021-12-03 MED ORDER — HYDROCODONE-ACETAMINOPHEN 5-325 MG PO TABS
1.0000 | ORAL_TABLET | ORAL | Status: DC | PRN
  Administered 2021-12-04: 2 via ORAL
  Filled 2021-12-03: qty 2

## 2021-12-03 MED ORDER — LACTATED RINGERS IV SOLN: INTRAVENOUS | Status: AC

## 2021-12-03 MED ORDER — ONDANSETRON HCL 4 MG PO TABS: 4.0000 mg | ORAL_TABLET | Freq: Four times a day (QID) | ORAL | Status: DC | PRN

## 2021-12-03 MED ORDER — ALBUTEROL SULFATE (2.5 MG/3ML) 0.083% IN NEBU
2.5000 mg | INHALATION_SOLUTION | RESPIRATORY_TRACT | Status: DC | PRN
  Administered 2021-12-04 – 2021-12-11 (×12): 2.5 mg via RESPIRATORY_TRACT
  Filled 2021-12-03 (×13): qty 3

## 2021-12-03 MED ORDER — LIDOCAINE 5 % EX PTCH
1.0000 | MEDICATED_PATCH | CUTANEOUS | Status: DC
  Administered 2021-12-03 – 2021-12-15 (×5): 1 via TRANSDERMAL
  Filled 2021-12-03 (×14): qty 1

## 2021-12-03 MED ORDER — PREDNISONE 20 MG PO TABS: 40.0000 mg | ORAL_TABLET | Freq: Every day | ORAL | Status: DC

## 2021-12-03 MED ORDER — ACETAMINOPHEN 325 MG PO TABS: 650.0000 mg | ORAL_TABLET | Freq: Four times a day (QID) | ORAL | Status: DC | PRN

## 2021-12-03 MED ORDER — METHYLPREDNISOLONE SODIUM SUCC 125 MG IJ SOLR
125.0000 mg | Freq: Once | INTRAMUSCULAR | Status: AC
Start: 2021-12-03 — End: 2021-12-03
  Administered 2021-12-03: 125 mg via INTRAVENOUS
  Filled 2021-12-03: qty 2

## 2021-12-03 MED ORDER — HEPARIN SODIUM (PORCINE) 5000 UNIT/ML IJ SOLN
5000.0000 [IU] | Freq: Three times a day (TID) | INTRAMUSCULAR | Status: DC
Start: 2021-12-03 — End: 2021-12-04
  Administered 2021-12-03 – 2021-12-04 (×3): 5000 [IU] via SUBCUTANEOUS
  Filled 2021-12-03 (×3): qty 1

## 2021-12-03 MED ORDER — SODIUM CHLORIDE 0.9 % IV SOLN
1.0000 g | Freq: Once | INTRAVENOUS | Status: AC
  Administered 2021-12-03: 1 g via INTRAVENOUS
  Filled 2021-12-03: qty 10

## 2021-12-03 MED ORDER — METHYLPREDNISOLONE SODIUM SUCC 125 MG IJ SOLR
60.0000 mg | Freq: Two times a day (BID) | INTRAMUSCULAR | Status: AC
  Administered 2021-12-04: 60 mg via INTRAVENOUS
  Filled 2021-12-03 (×2): qty 2

## 2021-12-03 MED ORDER — ONDANSETRON HCL 4 MG/2ML IJ SOLN
4.0000 mg | Freq: Once | INTRAMUSCULAR | Status: AC
  Administered 2021-12-03: 4 mg via INTRAVENOUS
  Filled 2021-12-03: qty 2

## 2021-12-03 MED ORDER — TRAZODONE HCL 50 MG PO TABS
100.0000 mg | ORAL_TABLET | Freq: Once | ORAL | Status: AC
  Administered 2021-12-03: 100 mg via ORAL
  Filled 2021-12-03: qty 2

## 2021-12-03 MED ORDER — LISINOPRIL 10 MG PO TABS
20.0000 mg | ORAL_TABLET | Freq: Every day | ORAL | Status: DC
  Administered 2021-12-03 – 2021-12-04 (×2): 20 mg via ORAL
  Filled 2021-12-03 (×2): qty 2

## 2021-12-03 MED ORDER — POLYETHYLENE GLYCOL 3350 17 G PO PACK
17.0000 g | PACK | Freq: Every day | ORAL | Status: DC | PRN
  Administered 2021-12-08 – 2021-12-10 (×3): 17 g via ORAL
  Filled 2021-12-03 (×3): qty 1

## 2021-12-03 MED ORDER — IPRATROPIUM-ALBUTEROL 0.5-2.5 (3) MG/3ML IN SOLN
3.0000 mL | Freq: Once | RESPIRATORY_TRACT | Status: AC
  Administered 2021-12-03: 3 mL via RESPIRATORY_TRACT
  Filled 2021-12-03: qty 3

## 2021-12-03 MED ORDER — IPRATROPIUM-ALBUTEROL 0.5-2.5 (3) MG/3ML IN SOLN
3.0000 mL | Freq: Four times a day (QID) | RESPIRATORY_TRACT | Status: DC
  Administered 2021-12-04 (×2): 3 mL via RESPIRATORY_TRACT
  Filled 2021-12-03 (×2): qty 3

## 2021-12-03 MED ORDER — BISACODYL 5 MG PO TBEC
5.0000 mg | DELAYED_RELEASE_TABLET | Freq: Every day | ORAL | Status: DC | PRN
  Administered 2021-12-10: 5 mg via ORAL
  Filled 2021-12-03: qty 1

## 2021-12-03 MED ORDER — ONDANSETRON HCL 4 MG/2ML IJ SOLN: 4.0000 mg | Freq: Four times a day (QID) | INTRAMUSCULAR | Status: DC | PRN

## 2021-12-03 NOTE — H&P (Signed)
History and Physical    Patient: Deanna Jacobson VQM:[id number] DOB: [birth year] DOA: 12/03/2021 DOS: the patient was seen and examined on 12/04/2021 PCP: Donnie Coffin, MD  Patient coming from: Home  Chief Complaint:  Chief Complaint  Patient presents with   Shortness of Breath    Lung Mass   HPI: Deanna Jacobson is a 66 y.o. adult with medical history significant of htn coming for SOB worsening for weeks pt has completed abx course for PNA. Pt Has had CT chest showing lung cancer and admission requested for inpatient eval and management for sob and lung cancer.  Pt is crying and anxious about ct results .  D/w her about -plan to admit and oncology consult.    Review of Systems  Constitutional:  Positive for malaise/fatigue.  Respiratory:  Positive for cough and shortness of breath.   Neurological:  Positive for weakness.  All other systems reviewed and are negative.   Past Medical History:  Diagnosis Date   Chronic back pain    Hypertension    Past Surgical History:  Procedure Laterality Date   BREAST ENHANCEMENT SURGERY     BREAST SURGERY     COLONOSCOPY WITH PROPOFOL N/A 02/15/2019   Procedure: COLONOSCOPY WITH PROPOFOL;  Surgeon: [name], MD;  Location: [facility] ENDOSCOPY;  Service: Endoscopy;  Laterality: N/A;   ESOPHAGOGASTRODUODENOSCOPY (EGD) WITH PROPOFOL N/A 02/15/2019   Procedure: ESOPHAGOGASTRODUODENOSCOPY (EGD) WITH PROPOFOL;  Surgeon: [name], MD;  Location: [facility] ENDOSCOPY;  Service: Endoscopy;  Laterality: N/A;   Social History:  reports that she quit smoking about 3 years ago. Her smoking use included cigarettes. She smoked an average of 1 pack per day. She has never used smokeless tobacco. She reports current alcohol use. She reports current drug use. Drug: Marijuana.  Allergies  Allergen Reactions   Codeine Nausea And Vomiting    Family History  Problem Relation Age of Onset   Cancer Mother    Hypertension Father     Prior  to Admission medications   Medication Sig Start Date End Date Taking? Authorizing Provider  amLODipine (NORVASC) 2.5 MG tablet Take 5 mg by mouth.     [provider]  celecoxib (CELEBREX) 100 MG capsule Take 1 capsule (100 mg total) by mouth 2 (two) times daily. 04/09/20 07/08/20  Milinda Pointer, MD  cyclobenzaprine (FLEXERIL) 10 MG tablet Take 1 tablet (10 mg total) by mouth 3 (three) times daily as needed for muscle spasms. 04/09/20 07/08/20  Milinda Pointer, MD  gabapentin (NEURONTIN) 600 MG tablet Take 1 tablet (600 mg total) by mouth 3 (three) times daily. 04/09/20 07/08/20  Milinda Pointer, MD  lisinopril (PRINIVIL,ZESTRIL) 20 MG tablet Take 1 tablet (20 mg total) by mouth daily. 10/29/15   Fritzi Mandes, MD  omeprazole (PRILOSEC) 20 MG capsule Take 1 capsule (20 mg total) by mouth 2 (two) times daily for 14 days. 02/20/19 05/09/19  [name], MD  traZODone (DESYREL) 100 MG tablet Take 100 mg by mouth at bedtime.  10/27/15   [provider]    Physical Exam: Vitals:   12/03/21 2115 12/03/21 2129 12/03/21 2237 12/04/21 0004  BP:  (!) 146/81 (!) 189/87 (!) 111/57  Pulse: 81 82 83 95  Resp:  17 16 16   Temp:  98.2 F (36.8 C) 97.6 F (36.4 C) 98.2 F (36.8 C)  TempSrc:  Oral Axillary Oral  SpO2:  95% 98% 96%  Weight:   45.9 kg   Height:   5\' 1"  (1.549 m)  Physical Exam Vitals and nursing note reviewed.  Constitutional:      General: She is not in acute distress.    Appearance: Normal appearance. She is not ill-appearing, toxic-appearing or diaphoretic.     Interventions: Nasal cannula in place.  HENT:     Head: Normocephalic and atraumatic.     Right Ear: Hearing and external ear normal.     Left Ear: Hearing and external ear normal.     Nose: Nose normal. No nasal deformity.     Mouth/Throat:     Lips: Pink.     Mouth: Mucous membranes are dry.     Tongue: No lesions.  Eyes:     General: Lids are normal.     Extraocular Movements: Extraocular  movements intact.     Pupils: Pupils are equal, round, and reactive to light.  Neck:     Vascular: No carotid bruit.  Cardiovascular:     Rate and Rhythm: Normal rate and regular rhythm.     Pulses: Normal pulses.     Heart sounds: Normal heart sounds.  Pulmonary:     Effort: Pulmonary effort is normal. No tachypnea, bradypnea, accessory muscle usage or prolonged expiration.     Breath sounds: Examination of the right-upper field reveals wheezing. Examination of the left-upper field reveals wheezing. Examination of the right-middle field reveals wheezing. Examination of the left-middle field reveals wheezing. Examination of the right-lower field reveals wheezing. Examination of the left-lower field reveals wheezing. Wheezing present.  Abdominal:     General: Bowel sounds are normal. There is no distension.     Palpations: Abdomen is soft. There is no mass.     Tenderness: There is no abdominal tenderness. There is no guarding.     Hernia: No hernia is present.  Musculoskeletal:     Right lower leg: No edema.     Left lower leg: No edema.  Skin:    General: Skin is warm.  Neurological:     General: No focal deficit present.     Mental Status: She is alert and oriented to person, place, and time.     Cranial Nerves: Cranial nerves 2-12 are intact.     Motor: Motor function is intact.  Psychiatric:        Attention and Perception: Attention normal.        Mood and Affect: Mood normal.        Speech: Speech normal.        Behavior: Behavior normal. Behavior is cooperative.        Cognition and Memory: Cognition normal.     Data Reviewed: Results for orders placed or performed during the hospital encounter of 12/03/21 (from the past 24 hour(s))  Basic metabolic panel     Status: Abnormal   Collection Time: 12/03/21  3:31 PM  Result Value Ref Range   Sodium 126 (L) 135 - 145 mmol/L   Potassium 3.8 3.5 - 5.1 mmol/L   Chloride 92 (L) 98 - 111 mmol/L   CO2 25 22 - 32 mmol/L    Glucose, Bld 114 (H) 70 - 99 mg/dL   BUN 14 8 - 23 mg/dL   Creatinine, Ser 0.73 0.44 - 1.00 mg/dL   Calcium 8.8 (L) 8.9 - 10.3 mg/dL   GFR, Estimated >60 >60 mL/min   Anion gap 9 5 - 15  CBC     Status: Abnormal   Collection Time: 12/03/21  3:31 PM  Result Value Ref Range   WBC 8.8 4.0 -  10.5 K/uL   RBC 4.16 3.87 - 5.11 MIL/uL   Hemoglobin 9.3 (L) 12.0 - 15.0 g/dL   HCT 29.5 (L) 36.0 - 46.0 %   MCV 70.9 (L) 80.0 - 100.0 fL   MCH 22.4 (L) 26.0 - 34.0 pg   MCHC 31.5 30.0 - 36.0 g/dL   RDW 17.2 (H) 11.5 - 15.5 %   Platelets 472 (H) 150 - 400 K/uL   nRBC 0.0 0.0 - 0.2 %  Troponin I (High Sensitivity)     Status: None   Collection Time: 12/03/21  3:31 PM  Result Value Ref Range   Troponin I (High Sensitivity) 12 <18 ng/L  Troponin I (High Sensitivity)     Status: None   Collection Time: 12/03/21  5:31 PM  Result Value Ref Range   Troponin I (High Sensitivity) 13 <18 ng/L     Assessment and Plan: * SOB (shortness of breath) Attribute to ct finding of suspected lung cancer and postobstructive PNA. With elevated procalcitonin. Supplemental oxygen and duoneb therapy Oncology consult for mediastinal mass evaluation. .    Acute renal failure (ARF) ([place]) Lab Results  Component Value Date   CREATININE 0.73 12/03/2021   CREATININE 0.70 12/02/2021   CREATININE 0.98 06/13/2018  resolved.     Iron deficiency anemia    Latest Ref Rng & Units 12/03/2021    3:31 PM 02/01/2019    3:22 PM 12/23/2016    4:15 PM  CBC  WBC 4.0 - 10.5 K/uL 8.8  9.6  6.5   Hemoglobin 12.0 - 15.0 g/dL 9.3  11.5  12.8   Hematocrit 36.0 - 46.0 % 29.5  34.4  39.9   Platelets 150 - 400 K/uL 472  274  325    New we will follow . Type and screen. Iv ppi.   Reflux esophagitis IV ppi.    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 Pt stopped smoking in 2019.    Hyponatremia Due to SIADH, most likely.  We will cont  With fluid restriction . Salt tablets.      Advance Care  Planning:    Code Status: Full Code   Consults:  Oncology consult:  Family Communication:  Benjaman Pott (Mother)  [phone] (Home Phone)  Severity of Illness: The appropriate patient status for this patient is INPATIENT. Inpatient status is judged to be reasonable and necessary in order to provide the required intensity of service to ensure the patient's safety. The patient's presenting symptoms, physical exam findings, and initial radiographic and laboratory data in the context of their chronic comorbidities is felt to place them at high risk for further clinical deterioration. Furthermore, it is not anticipated that the patient will be medically stable for discharge from the hospital within 2 midnights of admission.   * I certify that at the point of admission it is my clinical judgment that the patient will require inpatient hospital care spanning beyond 2 midnights from the point of admission due to high intensity of service, high risk for further deterioration and high frequency of surveillance required.*  Author: Para Skeans, MD 12/04/2021 1:20 AM  For on call review [email or web address].

## 2021-12-03 NOTE — ED Triage Notes (Signed)
Pt reports SOB, unable to eat or swallow x2 months. Pt states she had a scan done that showed a mass on lung and is being treated for pneumonia. Pt referred to ER by her MD.

## 2021-12-03 NOTE — Hospital Course (Addendum)
Deanna Jacobson is a 66 year old with history of hypertension, previous smoker, recently treated with extended course of antibiotics for pneumonia had a routine CT scan of the chest by primary care physician's office and found to have extensive malignancy and sent to ER.  Patient with generalized symptoms of malaise, fatigue, cough and shortness of breath.  Admitted for symptomatic treatment, diagnosis.  Oncology consultation. Bronchoscopy and biopsy, consistent with small cell lung cancer. MRI brain with multiple metastatic lesions. She continued hospitalization for symptom control and also inpatient radiation treatment. She was followed closely by palliative care and oncology. Ongoing [place] discussions were held and ultimately patient and family decided to forego further treatments and pursue comfort care/hospice.

## 2021-12-03 NOTE — ED Provider Triage Note (Signed)
Emergency Medicine Provider Triage Evaluation Note  Deanna Jacobson , a 66 y.o. adult  was evaluated in triage.  Pt complains of mass in her lung.  Shortness of breath.  Difficulty swallowing.  Had CT performed by her doctor which did show a lung mass.  Was told to come the emergency department.  Review of Systems  Positive: See above Negative: Fever chills  Physical Exam  BP 131/71   Pulse 78   Temp 98.2 F (36.8 C) (Oral)   Resp 18   SpO2 96%  Gen:   Awake, no distress   Resp:  Normal effort  MSK:   Moves extremities without difficulty  Other:    Medical Decision Making  Medically screening exam initiated at 3:35 PM.  Appropriate orders placed.  Tiare Rohlman was informed that the remainder of the evaluation will be completed by another provider, this initial triage assessment does not replace that evaluation, and the importance of remaining in the ED until their evaluation is complete.     Versie Starks, PA-C 12/03/21 1535

## 2021-12-03 NOTE — ED Provider Notes (Signed)
[facility] Provider Note    Event Date/Time   First MD Initiated Contact with Patient 12/03/21 1923     (approximate)   History   Shortness of Breath (Lung Mass)   HPI  Cheetara Hoge is a 66 y.o. adult with a past medical history of HTN, chronic back pain gabapentin, remote tobacco abuse and recent treatment of presumed pneumonia who presents to the emergency room after being referred by PCP following an outpatient CT chest today done yesterday that was concerning for fairly advanced what appeared to be bronchogenic carcinoma and pericardial effusion.  Patient tells this examiner she has has had worsening shortness of breath for weeks as well as a cough.  No hemoptysis.  She states that her back pain is chronic and not specifically different today.  No injuries or falls.  She states she last smoked in 2019 but intermittently uses THC.  No significant EtOH use.  No fevers, headache or earache, sore throat, vomiting, urinary symptoms rash or extremity pain.  She states she has never had cancer she is aware of before.   Past Medical History:  Diagnosis Date   Chronic back pain    Hypertension      Physical Exam  Triage Vital Signs: ED Triage Vitals  Enc Vitals Group     BP 12/03/21 1532 131/71     Pulse Rate 12/03/21 1532 78     Resp 12/03/21 1532 18     Temp 12/03/21 1532 98.2 F (36.8 C)     Temp Source 12/03/21 1532 Oral     SpO2 12/03/21 1532 96 %     Weight --      Height --      Head Circumference --      Peak Flow --      Pain Score 12/03/21 1526 0     Pain Loc --      Pain Edu? --      Excl. in [place]? --     Most recent vital signs: Vitals:   12/03/21 1532 12/03/21 1925  BP: 131/71 134/70  Pulse: 78 81  Resp: 18 20  Temp: 98.2 F (36.8 C)   SpO2: 96% 97%    General: Awake, uncomfortable appearing. CV:  2+ radial pulse.  Audible systolic murmur. Resp:  Borderline stridorous without specific wheezing. Abd:  No distention.   Soft. Other:  No significant lower extremity edema.   ED Results / Procedures / Treatments  Labs (all labs ordered are listed, but only abnormal results are displayed) Labs Reviewed  BASIC METABOLIC PANEL - Abnormal; Notable for the following components:      Result Value   Sodium 126 (*)    Chloride 92 (*)    Glucose, Bld 114 (*)    Calcium 8.8 (*)    All other components within normal limits  CBC - Abnormal; Notable for the following components:   Hemoglobin 9.3 (*)    HCT 29.5 (*)    MCV 70.9 (*)    MCH 22.4 (*)    RDW 17.2 (*)    Platelets 472 (*)    All other components within normal limits  SARS CORONAVIRUS 2 BY RT PCR  PROCALCITONIN  TROPONIN I (HIGH SENSITIVITY)  TROPONIN I (HIGH SENSITIVITY)     EKG  ECG is remarkable for sinus rhythm with a ventricular rate of 76 and very mild artifact in inferior leads with otherwise normal axis and unremarkable intervals and no other clear evidence of  acute ischemia.   RADIOLOGY  Chest x-ray my interpretation shows borderline cardiomegaly and bilateral opacities without large effusion, pneumothorax or overt edema.  I also reviewed radiology's interpretation.  CT chest obtained yesterday interpreted by myself shows what appears to be fairly diffuse large masses compressing trachea and bronchi.  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   PROCEDURES:  Critical Care performed: No  Procedures    MEDICATIONS ORDERED IN ED: Medications  lidocaine (LIDODERM) 5 % 1 patch (1 patch Transdermal Patch Applied 12/03/21 2016)  methylPREDNISolone sodium succinate (SOLU-MEDROL)  125 mg/2 mL injection 125 mg (has no administration in time range)  ipratropium-albuterol (DUONEB) 0.5-2.5 (3) MG/3ML nebulizer solution 3 mL (has no administration in time range)  cefTRIAXone (ROCEPHIN) 1 g in sodium chloride 0.9 % 100 mL IVPB (has no administration in time range)  oxyCODONE-acetaminophen (PERCOCET/ROXICET) 5-325 MG per tablet 1 tablet (1 tablet Oral Given 12/03/21 2023)  ondansetron (ZOFRAN) injection 4 mg (4 mg Intravenous Given 12/03/21 2021)     IMPRESSION / MDM / ASSESSMENT AND PLAN / ED COURSE  I reviewed the triage vital signs and the nursing notes. Patient's presentation is most consistent with acute presentation with potential threat to life or bodily function.                               Differential diagnosis includes, but is not limited to malignancy, PE, pneumonia, medic pericardial effusion, arrhythmia, anemia and obstructive airway disease exacerbation.   ECG is remarkable for sinus rhythm with a ventricular rate of 76 and very mild artifact in inferior leads with otherwise normal axis and unremarkable intervals and no other clear evidence of acute ischemia.  Nonelevated troponin x2 is not suggestive of ACS.  Chest x-ray my interpretation shows borderline cardiomegaly and bilateral opacities without large effusion, pneumothorax or overt edema.  I also reviewed radiology's interpretation.  CT chest obtained yesterday interpreted by myself shows what appears to be fairly diffuse large masses compressing trachea and bronchi.  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  BMP shows a sodium of 126 without other  significant electrolyte or metabolic derangements.  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  CBC shows no leukocytosis and hemoglobin of 9.3 without recent for comparison.  I will admit to medicine for further evaluation and management.     FINAL CLINICAL IMPRESSION(S) / ED DIAGNOSES   Final diagnoses:  SOB (shortness of breath)  Malignant neoplasm of lung, unspecified laterality, unspecified part of lung (HCC)  Pericardial effusion  Hyponatremia     Rx / DC Orders   ED Discharge Orders     None        Note:  This document was prepared using Dragon voice recognition software and may include unintentional dictation errors.   Lucrezia Starch, MD 12/03/21 2049

## 2021-12-04 ENCOUNTER — Institutional Professional Consult (permissible substitution): Payer: Medicare Other | Admitting: Radiation Oncology

## 2021-12-04 ENCOUNTER — Inpatient Hospital Stay (HOSPITAL_COMMUNITY)
Admit: 2021-12-04 | Discharge: 2021-12-04 | Disposition: A | Discharge status: Home / Self Care | Payer: Medicare Other | Attending: Pulmonary Disease | Admitting: Pulmonary Disease

## 2021-12-04 ENCOUNTER — Telehealth: Payer: Self-pay

## 2021-12-04 DIAGNOSIS — R0602 Shortness of breath: Secondary | ICD-10-CM | POA: Diagnosis not present

## 2021-12-04 DIAGNOSIS — I3139 Other pericardial effusion (noninflammatory): Secondary | ICD-10-CM

## 2021-12-04 DIAGNOSIS — C3490 Malignant neoplasm of unspecified part of unspecified bronchus or lung: Secondary | ICD-10-CM

## 2021-12-04 DIAGNOSIS — E871 Hypo-osmolality and hyponatremia: Secondary | ICD-10-CM | POA: Diagnosis present

## 2021-12-04 DIAGNOSIS — Z515 Encounter for palliative care: Secondary | ICD-10-CM

## 2021-12-04 DIAGNOSIS — E440 Moderate protein-calorie malnutrition: Secondary | ICD-10-CM | POA: Insufficient documentation

## 2021-12-04 LAB — HIV ANTIBODY (ROUTINE TESTING W REFLEX): HIV Screen 4th Generation wRfx: NONREACTIVE

## 2021-12-04 MED ORDER — HYDROCOD POLI-CHLORPHE POLI ER 10-8 MG/5ML PO SUER
5.0000 mL | Freq: Two times a day (BID) | ORAL | Status: DC | PRN
  Administered 2021-12-04 – 2021-12-14 (×7): 5 mL via ORAL
  Filled 2021-12-04 (×7): qty 5

## 2021-12-04 MED ORDER — SODIUM CHLORIDE 0.9 % IV SOLN
2.0000 g | INTRAVENOUS | Status: DC
  Administered 2021-12-04 – 2021-12-09 (×6): 2 g via INTRAVENOUS
  Filled 2021-12-04 (×6): qty 20

## 2021-12-04 MED ORDER — BENZONATATE 100 MG PO CAPS
100.0000 mg | ORAL_CAPSULE | Freq: Three times a day (TID) | ORAL | Status: DC | PRN
  Administered 2021-12-05 – 2021-12-16 (×19): 100 mg via ORAL
  Filled 2021-12-04 (×20): qty 1

## 2021-12-04 MED ORDER — GABAPENTIN 100 MG PO CAPS
800.0000 mg | ORAL_CAPSULE | Freq: Four times a day (QID) | ORAL | Status: DC
  Administered 2021-12-04 – 2021-12-12 (×32): 800 mg via ORAL
  Filled 2021-12-04 (×32): qty 2

## 2021-12-04 MED ORDER — ESCITALOPRAM OXALATE 10 MG PO TABS
10.0000 mg | ORAL_TABLET | Freq: Every day | ORAL | Status: DC
  Administered 2021-12-04 – 2021-12-16 (×13): 10 mg via ORAL
  Filled 2021-12-04 (×14): qty 1

## 2021-12-04 MED ORDER — ENSURE ENLIVE PO LIQD
237.0000 mL | Freq: Two times a day (BID) | ORAL | Status: DC
  Administered 2021-12-04 – 2021-12-09 (×8): 237 mL via ORAL

## 2021-12-04 MED ORDER — HEPARIN SODIUM (PORCINE) 5000 UNIT/ML IJ SOLN
5000.0000 [IU] | Freq: Three times a day (TID) | INTRAMUSCULAR | Status: AC
  Administered 2021-12-04 – 2021-12-06 (×7): 5000 [IU] via SUBCUTANEOUS
  Filled 2021-12-04 (×7): qty 1

## 2021-12-04 MED ORDER — GABAPENTIN 800 MG PO TABS
800.0000 mg | ORAL_TABLET | Freq: Four times a day (QID) | ORAL | Status: DC
  Filled 2021-12-04 (×2): qty 1

## 2021-12-04 MED ORDER — CYCLOBENZAPRINE HCL 10 MG PO TABS
10.0000 mg | ORAL_TABLET | Freq: Three times a day (TID) | ORAL | Status: DC | PRN
  Administered 2021-12-06 – 2021-12-10 (×3): 10 mg via ORAL
  Filled 2021-12-04 (×3): qty 1

## 2021-12-04 MED ORDER — NABUMETONE 500 MG PO TABS
500.0000 mg | ORAL_TABLET | Freq: Two times a day (BID) | ORAL | Status: DC
  Filled 2021-12-04: qty 1

## 2021-12-04 MED ORDER — TRAZODONE HCL 50 MG PO TABS
100.0000 mg | ORAL_TABLET | Freq: Every day | ORAL | Status: DC
  Administered 2021-12-04 – 2021-12-15 (×12): 100 mg via ORAL
  Filled 2021-12-04 (×12): qty 2

## 2021-12-04 MED ORDER — ARFORMOTEROL TARTRATE 15 MCG/2ML IN NEBU
15.0000 ug | INHALATION_SOLUTION | Freq: Two times a day (BID) | RESPIRATORY_TRACT | Status: DC
  Administered 2021-12-04 – 2021-12-16 (×24): 15 ug via RESPIRATORY_TRACT
  Filled 2021-12-04 (×26): qty 2

## 2021-12-04 MED ORDER — BUDESONIDE 0.25 MG/2ML IN SUSP
0.2500 mg | Freq: Two times a day (BID) | RESPIRATORY_TRACT | Status: DC
  Administered 2021-12-04 – 2021-12-16 (×24): 0.25 mg via RESPIRATORY_TRACT
  Filled 2021-12-04 (×25): qty 2

## 2021-12-04 MED ORDER — CELECOXIB 100 MG PO CAPS
100.0000 mg | ORAL_CAPSULE | Freq: Two times a day (BID) | ORAL | Status: DC
  Filled 2021-12-04: qty 1

## 2021-12-04 MED ORDER — ALPRAZOLAM 0.25 MG PO TABS
0.2500 mg | ORAL_TABLET | Freq: Three times a day (TID) | ORAL | Status: DC | PRN
  Administered 2021-12-04 – 2021-12-09 (×9): 0.25 mg via ORAL
  Filled 2021-12-04 (×10): qty 1

## 2021-12-04 MED ORDER — REVEFENACIN 175 MCG/3ML IN SOLN
175.0000 ug | Freq: Every day | RESPIRATORY_TRACT | Status: DC
Start: 2021-12-04 — End: 2021-12-16
  Administered 2021-12-04 – 2021-12-16 (×13): 175 ug via RESPIRATORY_TRACT
  Filled 2021-12-04 (×13): qty 3

## 2021-12-04 MED ORDER — ALPRAZOLAM 0.25 MG PO TABS
0.2500 mg | ORAL_TABLET | Freq: Once | ORAL | Status: AC
  Administered 2021-12-04: 0.25 mg via ORAL
  Filled 2021-12-04: qty 1

## 2021-12-04 MED ORDER — MORPHINE SULFATE (CONCENTRATE) 10 MG/0.5ML PO SOLN
5.0000 mg | ORAL | Status: DC | PRN
  Administered 2021-12-04 – 2021-12-09 (×15): 10 mg via ORAL
  Filled 2021-12-04 (×16): qty 0.5

## 2021-12-04 MED ORDER — ADULT MULTIVITAMIN W/MINERALS CH
1.0000 | ORAL_TABLET | Freq: Every day | ORAL | Status: DC
  Administered 2021-12-04 – 2021-12-15 (×12): 1 via ORAL
  Filled 2021-12-04 (×12): qty 1

## 2021-12-04 MED ORDER — IRBESARTAN 150 MG PO TABS
75.0000 mg | ORAL_TABLET | Freq: Every day | ORAL | Status: DC
  Filled 2021-12-04: qty 1

## 2021-12-04 NOTE — Progress Notes (Signed)
*  PRELIMINARY RESULTS* Echocardiogram 2D Echocardiogram has been performed.  Deanna Jacobson 12/04/2021, 2:17 PM

## 2021-12-04 NOTE — Telephone Encounter (Signed)
EBUS with [facility] scheduled for 12/07/2021 at 12:30. Patient is inpatient.  [id number] mass  [name], please advise. Thanks

## 2021-12-04 NOTE — Consult Note (Signed)
NEW PATIENT EVALUATION  Name: Deanna Jacobson  MRN: [id number]  Date:   12/03/2021     DOB: 22-Jun-1955   This 66 y.o. 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  REFERRING PHYSICIAN: No ref. provider found  CHIEF COMPLAINT:  Chief Complaint  Patient presents with   Shortness of Breath    Lung Mass    DIAGNOSIS: The primary encounter diagnosis was SOB (shortness of breath). Diagnoses of Malignant neoplasm of lung, unspecified laterality, unspecified part of lung ([place]), Pericardial effusion, and Hyponatremia were also pertinent to this visit.   PREVIOUS INVESTIGATIONS:  CT scan reviewed Clinical notes reviewed Bronchoscopy planned  HPI: Patient is a 66 year old female admitted with increasing shortness of breath cough.  CT scan demonstrated large mediastinal mass encasing the trachea and bilateral mainstem bronchi.  There was marked mass effect and possible invasion of the superior vena cava which is significantly narrowed.  There is also encasement and narrowing of the bilateral superior pulmonary veins.  She has been seen by medical oncology and bronchoscopy has been scheduled for Monday of next week.  I been asked to evaluate her for palliative radiation therapy for her SVC.  She is seen today in her hospital room she does have marked cough quite emotional.  PLANNED TREATMENT REGIMEN: Palliative radiation therapy to her chest  PAST MEDICAL HISTORY:  has a past medical history of Chronic back pain and Hypertension.    PAST SURGICAL HISTORY:  Past Surgical History:  Procedure Laterality Date   BREAST ENHANCEMENT SURGERY     BREAST SURGERY     COLONOSCOPY WITH PROPOFOL N/A 02/15/2019   Procedure: COLONOSCOPY WITH PROPOFOL;  Surgeon: [name], MD;  Location: [facility] ENDOSCOPY;  Service: Endoscopy;  Laterality: N/A;    ESOPHAGOGASTRODUODENOSCOPY (EGD) WITH PROPOFOL N/A 02/15/2019   Procedure: ESOPHAGOGASTRODUODENOSCOPY (EGD) WITH PROPOFOL;  Surgeon: [name], MD;  Location: [facility] ENDOSCOPY;  Service: Endoscopy;  Laterality: N/A;    FAMILY HISTORY: family history includes Cancer in her mother; Hypertension in her father.  SOCIAL HISTORY:  reports that she quit smoking about 3 years ago. Her smoking use included cigarettes. She smoked an average of 1 pack per day. She has never used smokeless tobacco. She reports current alcohol use. She reports current drug use. Drug: Marijuana.  ALLERGIES: Codeine  MEDICATIONS:  Current Facility-Administered Medications  Medication Dose Route Frequency Provider Last Rate Last Admin   acetaminophen (TYLENOL) tablet 650 mg  650 mg Oral Q6H PRN Para Skeans, MD       Or   acetaminophen (TYLENOL) suppository 650 mg  650 mg Rectal Q6H PRN Para Skeans, MD       albuterol (PROVENTIL) (2.5 MG/3ML) 0.083% nebulizer solution 2.5 mg  2.5 mg Nebulization Q2H PRN Para Skeans, MD   2.5 mg at 12/04/21 0503   benzonatate (TESSALON) capsule 100 mg  100 mg Oral TID PRN Barb Merino, MD       bisacodyl (DULCOLAX) EC tablet 5 mg  5 mg Oral Daily PRN Para Skeans, MD       celecoxib (CELEBREX) capsule 100 mg  100 mg Oral BID Barb Merino, MD       cyclobenzaprine (FLEXERIL) tablet 10 mg  10 mg Oral TID PRN Barb Merino, MD       docusate sodium (COLACE) capsule 100 mg  100 mg Oral BID Para Skeans,  MD   100 mg at 12/03/21 2303   gabapentin (NEURONTIN) capsule 800 mg  800 mg Oral QID Madueme, Elvira C, RPH       guaiFENesin (MUCINEX) 12 hr tablet 600 mg  600 mg Oral BID PRN Para Skeans, MD       heparin injection 5,000 Units  5,000 Units Subcutaneous Q8H Para Skeans, MD   5,000 Units at 12/04/21 0503   hydrALAZINE (APRESOLINE) injection 10 mg  10 mg Intravenous Q4H PRN Para Skeans, MD   10 mg at 12/03/21 2309   HYDROcodone-acetaminophen (NORCO/VICODIN) 5-325 MG per  tablet 1-2 tablet  1-2 tablet Oral Q4H PRN Para Skeans, MD   2 tablet at 12/04/21 0501   ipratropium-albuterol (DUONEB) 0.5-2.5 (3) MG/3ML nebulizer solution 3 mL  3 mL Nebulization Q6H Para Skeans, MD   3 mL at 12/04/21 0753   lidocaine (LIDODERM) 5 % 1 patch  1 patch Transdermal Q24H Para Skeans, MD   1 patch at 12/03/21 2016   lisinopril (ZESTRIL) tablet 20 mg  20 mg Oral Daily Para Skeans, MD   20 mg at 12/03/21 2303   methylPREDNISolone sodium succinate (SOLU-MEDROL) 125 mg/2 mL injection 60 mg  60 mg Intravenous Q12H Para Skeans, MD       Followed by   Derrill Memo ON 12/05/2021] predniSONE (DELTASONE) tablet 40 mg  40 mg Oral Q breakfast Para Skeans, MD       nabumetone (RELAFEN) tablet 500 mg  500 mg Oral BID Barb Merino, MD       nicotine (NICODERM CQ - dosed in mg/24 hours) patch 14 mg  14 mg Transdermal Daily Para Skeans, MD       ondansetron (ZOFRAN) tablet 4 mg  4 mg Oral Q6H PRN Para Skeans, MD       Or   ondansetron (ZOFRAN) injection 4 mg  4 mg Intravenous Q6H PRN Para Skeans, MD       polyethylene glycol (MIRALAX / GLYCOLAX) packet 17 g  17 g Oral Daily PRN Para Skeans, MD       sodium chloride flush (NS) 0.9 % injection 3 mL  3 mL Intravenous Q12H Florina Ou V, MD   3 mL at 12/03/21 2303   traZODone (DESYREL) tablet 100 mg  100 mg Oral QHS Barb Merino, MD        ECOG PERFORMANCE STATUS:  2 - Symptomatic, <50% confined to bed  REVIEW OF SYSTEMS: Patient denies any weight loss, fatigue, weakness, fever, chills or night sweats. Patient denies any loss of vision, blurred vision. Patient denies any ringing  of the ears or hearing loss. No irregular heartbeat. Patient denies heart murmur or history of fainting. Patient denies any chest pain or pain radiating to her upper extremities. Patient denies any shortness of breath, difficulty breathing at night, cough or hemoptysis. Patient denies any swelling in the lower legs. Patient denies any nausea vomiting, vomiting  of blood, or coffee ground material in the vomitus. Patient denies any stomach pain. Patient states has had normal bowel movements no significant constipation or diarrhea. Patient denies any dysuria, hematuria or significant nocturia. Patient denies any problems walking, swelling in the joints or loss of balance. Patient denies any skin changes, loss of hair or loss of weight. Patient denies any excessive worrying or anxiety or significant depression. Patient denies any problems with insomnia. Patient denies excessive thirst, polyuria, polydipsia. Patient denies any swollen glands, patient denies  easy bruising or easy bleeding. Patient denies any recent infections, allergies or URI. Patient "s visual fields have not changed significantly in recent time.   PHYSICAL EXAM: Patient does have some early venous jugular distention of bilateral neck vessels BP 137/65 (BP Location: Left Arm)   Pulse 97   Temp 97.7 F (36.5 C)   Resp 18   Ht 5\' 1"  (1.549 m)   Wt 101 lb 3.1 oz (45.9 kg)   SpO2 96%   BMI 19.12 kg/m  Thin frail female with marked cough quite emotional and in distress.  Well-developed well-nourished patient in NAD. HEENT reveals PERLA, EOMI, discs not visualized.  Oral cavity is clear. No oral mucosal lesions are identified. Neck is clear without evidence of cervical or supraclavicular adenopathy. Lungs are clear to A&P. Cardiac examination is essentially unremarkable with regular rate and rhythm without murmur rub or thrill. Abdomen is benign with no organomegaly or masses noted. Motor sensory and DTR levels are equal and symmetric in the upper and lower extremities. Cranial nerves II through XII are grossly intact. Proprioception is intact. No peripheral adenopathy or edema is identified. No motor or sensory levels are noted. Crude visual fields are within normal range.  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  PLAN: At this time elect go with start planning her radiation therapy would plan on delivering 40 Gray over 4 weeks to alleviate her SVC.  I have personally scheduled simulation early Monday morning prior to her bronchoscopy.  We will start treatments once we have tissue diagnosis confirmed.  Risks and benefits of radiation including possible dysphagia, call increasing cough skin reaction fatigue alteration of blood counts all were described in detail to the patient.  She comprehends my recommendations well.  We will we will simulate her as an inpatient on Monday.  I would like to take this opportunity to thank you for allowing me to participate in the care of your patient.Noreene Filbert, MD

## 2021-12-04 NOTE — Progress Notes (Signed)
Pt tearful again this morning with regards to diagnostic findings. Pt appears anxious with persistent non-productive coughing and SOB. Notified Sharion Settler, NP of above, orders for xanax once. This nurse administered prn breathing treatment and provided pain medication. Pt states "I've been up since 2AM with all of this on my mind and I can't stop coughing." Pt resting, asleep on reassessment.

## 2021-12-04 NOTE — Assessment & Plan Note (Signed)
Pt has chronic severe arthritis. Pt stopped smoking in 2019.

## 2021-12-04 NOTE — Assessment & Plan Note (Signed)
IV ppi.

## 2021-12-04 NOTE — Consult Note (Signed)
[facility] NOTE  Patient Care Team: Donnie Coffin, MD as PCP - General (Family Medicine)  CHIEF COMPLAINTS/PURPOSE OF CONSULTATION: Lung mass  Oncology History   No history exists.     HISTORY OF PRESENTING ILLNESS:  Deanna Jacobson 66 y.o.  adult with longstanding history of smoking currently quit-he is currently admitted to hospital for worsening shortness of breath cough and wheezing.  On admission to emergency room patient had a CT scan: " Large mediastinal mass is identified which encases/narrows the trachea and bilateral mainstem bronchi. There is marked mass effect and possible invasion of the superior vena cava which is significantly narrowed. There is encasement with narrowing of the main pulmonary arteries bilaterally. There is also encasement and narrowing of the bilateral superior pulmonary veins. Obstruction of the midthoracic esophagus is also noted. Tumor involvement of the pulmonary veins and left atrium is also noted. 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 Differential considerations include esophageal neoplasm or lymphoproliferative disorder.   Perihilar left lung mass is noted with endobronchial spread and postobstructive changes noted. This also invades the inferior left pulmonary vein.  Additional soft tissue mass within the posterior right upper lobe is identified which extends medially along the major fissure  Postobstructive atelectasis and endobronchial spread of tumor is identified extending into the periphery of the left lower lobe.  Small to moderate pericardial effusion"  Patient complains of ongoing fatigue.  Complains of weight loss.  Denies any fever chills.  Chronic back pain not any worse.  Denies any worsening headaches.  As per the family noted to have hoarseness of voice.  Also mild facial swelling.  Review of Systems  Constitutional:  Positive for malaise/fatigue and weight loss. Negative for chills,  diaphoresis and fever.  HENT:  Negative for nosebleeds and sore throat.   Eyes:  Negative for double vision.  Respiratory:  Positive for cough, sputum production, shortness of breath and wheezing. Negative for hemoptysis.   Cardiovascular:  Negative for chest pain, palpitations, orthopnea and leg swelling.  Gastrointestinal:  Negative for abdominal pain, blood in stool, constipation, diarrhea, heartburn, melena, nausea and vomiting.  Genitourinary:  Negative for dysuria, frequency and urgency.  Musculoskeletal:  Positive for back pain and joint pain.  Skin: Negative.  Negative for itching and rash.  Neurological:  Negative for dizziness, tingling, focal weakness, weakness and headaches.  Endo/Heme/Allergies:  Does not bruise/bleed easily.  Psychiatric/Behavioral:  Negative for depression. The patient is not nervous/anxious and does not have insomnia.     MEDICAL HISTORY:  Past Medical History:  Diagnosis Date   Chronic back pain    Hypertension     SURGICAL HISTORY: Past Surgical History:  Procedure Laterality Date   BREAST ENHANCEMENT SURGERY     BREAST SURGERY     COLONOSCOPY WITH PROPOFOL N/A 02/15/2019   Procedure: COLONOSCOPY WITH PROPOFOL;  Surgeon: [name], MD;  Location: [facility] ENDOSCOPY;  Service: Endoscopy;  Laterality: N/A;   ESOPHAGOGASTRODUODENOSCOPY (EGD) WITH PROPOFOL N/A 02/15/2019   Procedure: ESOPHAGOGASTRODUODENOSCOPY (EGD) WITH PROPOFOL;  Surgeon: [name], MD;  Location: [facility] ENDOSCOPY;  Service: Endoscopy;  Laterality: N/A;    SOCIAL HISTORY: Social History   Socioeconomic History   Marital status: Single    Spouse name: Not on file   Number of children: Not on file   Years of education: Not on file   Highest education level: Not on file  Occupational History   Not on file  Tobacco Use   Smoking status:  Former    Packs/day: 1.00    Types: Cigarettes    Quit date: 08/07/2018    Years since quitting: 3.3   Smokeless tobacco:  Never  Substance and Sexual Activity   Alcohol use: Yes    Alcohol/week: 0.0 standard drinks of alcohol    Comment: daily  drinks 4 beers   Drug use: Yes    Types: Marijuana    Comment: states she will take a hit off of a joint if anyone has one   Sexual activity: Not on file  Other Topics Concern   Not on file  Social History Narrative   Not on file   Social Determinants of Health   Financial Resource Strain: Not on file  Food Insecurity: Not on file  Transportation Needs: Not on file  Physical Activity: Not on file  Stress: Not on file  Social Connections: Not on file  Intimate Partner Violence: Not on file    FAMILY HISTORY: Family History  Problem Relation Age of Onset   Cancer Mother    Hypertension Father     ALLERGIES:  is allergic to codeine.  MEDICATIONS:  Current Facility-Administered Medications  Medication Dose Route Frequency Provider Last Rate Last Admin   acetaminophen (TYLENOL) tablet 650 mg  650 mg Oral Q6H PRN Para Skeans, MD       Or   acetaminophen (TYLENOL) suppository 650 mg  650 mg Rectal Q6H PRN Para Skeans, MD       albuterol (PROVENTIL) (2.5 MG/3ML) 0.083% nebulizer solution 2.5 mg  2.5 mg Nebulization Q2H PRN Florina Ou V, MD   2.5 mg at 12/04/21 2221   ALPRAZolam (XANAX) tablet 0.25 mg  0.25 mg Oral TID PRN Barb Merino, MD   0.25 mg at 12/04/21 2200   arformoterol (BROVANA) nebulizer solution 15 mcg  15 mcg Nebulization BID Tyler Pita, MD   15 mcg at 12/04/21 1908   benzonatate (TESSALON) capsule 100 mg  100 mg Oral TID PRN Barb Merino, MD       bisacodyl (DULCOLAX) EC tablet 5 mg  5 mg Oral Daily PRN Para Skeans, MD       budesonide (PULMICORT) nebulizer solution 0.25 mg  0.25 mg Nebulization BID Tyler Pita, MD   0.25 mg at 12/04/21 1908   cefTRIAXone (ROCEPHIN) 2 g in sodium chloride 0.9 % 100 mL IVPB  2 g Intravenous Q24H Tyler Pita, MD 200 mL/hr at 12/04/21 1841 2 g at 12/04/21 1841    chlorpheniramine-HYDROcodone 10-8 MG/5ML suspension 5 mL  5 mL Oral Q12H PRN Tyler Pita, MD   5 mL at 12/04/21 1313   cyclobenzaprine (FLEXERIL) tablet 10 mg  10 mg Oral TID PRN Barb Merino, MD       dexamethasone (DECADRON) injection 4 mg  4 mg Intravenous Q12H Charlaine Dalton R, MD       docusate sodium (COLACE) capsule 100 mg  100 mg Oral BID Florina Ou V, MD   100 mg at 12/04/21 2200   escitalopram (LEXAPRO) tablet 10 mg  10 mg Oral Daily Borders, Kirt Boys, NP   10 mg at 12/04/21 1710   feeding supplement (ENSURE ENLIVE / ENSURE PLUS) liquid 237 mL  237 mL Oral BID BM Barb Merino, MD   237 mL at 12/04/21 1312   gabapentin (NEURONTIN) capsule 800 mg  800 mg Oral QID Madueme, Elvira C, RPH   800 mg at 12/04/21 2200   guaiFENesin (MUCINEX) 12 hr tablet 600  mg  600 mg Oral BID PRN Para Skeans, MD       heparin injection 5,000 Units  5,000 Units Subcutaneous Q8H Tyler Pita, MD   5,000 Units at 12/04/21 2200   hydrALAZINE (APRESOLINE) injection 10 mg  10 mg Intravenous Q4H PRN Para Skeans, MD   10 mg at 12/03/21 2309   irbesartan (AVAPRO) tablet 75 mg  75 mg Oral Daily Tyler Pita, MD       lidocaine (LIDODERM) 5 % 1 patch  1 patch Transdermal Q24H Para Skeans, MD   1 patch at 12/03/21 2016   morphine CONCENTRATE 10 MG/0.5ML oral solution 5-10 mg  5-10 mg Oral Q4H PRN Borders, Kirt Boys, NP   10 mg at 12/04/21 2208   multivitamin with minerals tablet 1 tablet  1 tablet Oral Daily Barb Merino, MD   1 tablet at 12/04/21 1311   nicotine (NICODERM CQ - dosed in mg/24 hours) patch 14 mg  14 mg Transdermal Daily Para Skeans, MD       ondansetron (ZOFRAN) tablet 4 mg  4 mg Oral Q6H PRN Para Skeans, MD       Or   ondansetron (ZOFRAN) injection 4 mg  4 mg Intravenous Q6H PRN Para Skeans, MD       polyethylene glycol (MIRALAX / GLYCOLAX) packet 17 g  17 g Oral Daily PRN Para Skeans, MD       revefenacin (YUPELRI) nebulizer solution 175 mcg  175 mcg  Nebulization Daily Tyler Pita, MD   175 mcg at 12/04/21 1258   sodium chloride flush (NS) 0.9 % injection 3 mL  3 mL Intravenous Q12H Florina Ou V, MD   3 mL at 12/04/21 2209   traZODone (DESYREL) tablet 100 mg  100 mg Oral QHS Barb Merino, MD   100 mg at 12/04/21 2200    PHYSICAL EXAMINATION: ECOG PERFORMANCE STATUS: 1 - Symptomatic but completely ambulatory  Vitals:   12/04/21 1929 12/04/21 2351  BP: 132/73 118/60  Pulse: 99 91  Resp: 16 16  Temp: 97.7 F (36.5 C) 97.7 F (36.5 C)  SpO2: 96% (!) 89%   Filed Weights   12/03/21 2237 12/04/21 0500  Weight: 101 lb 3.1 oz (45.9 kg) 101 lb 3.1 oz (45.9 kg)    Physical Exam Vitals and nursing note reviewed.  Constitutional:      Comments:     HENT:     Head: Normocephalic and atraumatic.     Mouth/Throat:     Mouth: Mucous membranes are moist.     Pharynx: No oropharyngeal exudate.  Eyes:     Pupils: Pupils are equal, round, and reactive to light.  Cardiovascular:     Rate and Rhythm: Normal rate and regular rhythm.  Pulmonary:     Effort: No respiratory distress.     Breath sounds: No wheezing.     Comments: Decreased breath sounds bilaterally at bases.  No wheeze or crackles Abdominal:     General: Bowel sounds are normal. There is no distension.     Palpations: Abdomen is soft. There is no mass.     Tenderness: There is no abdominal tenderness. There is no guarding or rebound.  Musculoskeletal:        General: No tenderness. Normal range of motion.     Cervical back: Normal range of motion and neck supple.  Skin:    General: Skin is warm.  Neurological:     Mental  Status: She is alert and oriented to person, place, and time.  Psychiatric:        Mood and Affect: Affect normal.        Judgment: Judgment normal.     LABORATORY DATA:  I have reviewed the data as listed Lab Results  Component Value Date   WBC 8.8 12/03/2021   HGB 9.3 (L) 12/03/2021   HCT 29.5 (L) 12/03/2021   MCV 70.9 (L)  12/03/2021   PLT 472 (H) 12/03/2021   Recent Labs    12/02/21 1321 12/03/21 1531  NA  --  126*  K  --  3.8  CL  --  92*  CO2  --  25  GLUCOSE  --  114*  BUN  --  14  CREATININE 0.70 0.73  CALCIUM  --  8.8*  GFRNONAA  --  >60    RADIOGRAPHIC STUDIES: I have personally reviewed the radiological images as listed and agreed with the findings in the report.    # 66 y.o.  adult with multiple medical problems including smoking/COPD/ currently admitted to hospital for worsening shortness of breath/cough.  CT scan shows mediastinal mass/lymphadenopathy encasing the vascular structures; trachea/esophagus; invading left atrium; left upper lobe mass.  #CT scan findings: Highly concerning for malignancy.  See below  #Shortness of breath/facial puffiness/hoarseness of voice-question superior vena cava syndrome.  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  #ECOG performance status at baseline: 1-2  #Chronic back pain-no clinical evidence of malignancy involving the skeletal structures.  However await outpatient work-up including PET scan.  Recommendations/Plan:  #Check iron studies; ferritin.   # Given impending superior vena cava syndrome-recommend dexamethasone 4 mg twice daily.   #I had a long discussion with the patient/son regarding the significant concerning findings on imaging highly suggestive of primary lung malignancy.  Discussed that patient will need a PET scan as outpatient for further evaluation/staging.  #Also discussed the need for biopsy to confirm malignancy/type of malignancy.  Recommend evaluation with Dr. Patsey Berthold pulmonary.  Bronchoscopy planned for July 10th.  Also discussed with Dr. Lavone Nian consideration of radiation given impending superior vena cava syndrome.  Thank you Dr. Lovena Neighbours for allowing me to participate in the care of your pleasant patient. Please do not  hesitate to contact me with questions or concerns in the interim. Discussed with Dr. Patsey Berthold; Dr. Donella Stade; and Dr.Ghimre.   # I reviewed the blood work- with the patient in detail; also reviewed the imaging independently [as summarized above]; and with the patient in detail.  My contact information was given to the patient.     Cammie Sickle, MD

## 2021-12-04 NOTE — Telephone Encounter (Signed)
Patient only has Medicare A & B and Prior Auth Not Required

## 2021-12-04 NOTE — Consult Note (Signed)
NAME:  Deanna Jacobson, MRN:  [id number], DOB:  09/25/1955, LOS: 1 ADMISSION DATE:  12/03/2021, CONSULTATION DATE:  04 December 2021 REFERRING MD:  Barb Merino, MD CHIEF COMPLAINT: Lung mass  History of Present Illness:  Patient is a 66 year old former smoker (quit 2019, with medical history as noted below, who presented to [facility] on 6 July with increased shortness of breath.  The patient states that she had had cough for "weeks".  She had completed antibiotics that were given empirically for pneumonia.  She had a chest CT on 5 July showing a large mediastinal mass.  Because of increasing dyspnea and potential postobstructive pneumonia the patient was admitted for further admission and management.  Pulmonary is consulted for potential biopsy of this mass.  The patient states that she has not had any hemoptysis but has had pretty much nonstop cough for several weeks.  On examination today she has almost continual cough.  She has chronic back pain issues and is very debilitated.  She has also noticed weight loss but cannot quantitate.  She has had anorexia.  No hemoptysis.  No purulent sputum production.  No chest pain.  No recent fevers, chills or sweats.  Has significant shortness of breath even at rest.  She has had generalized malaise.  She feels fatigued as well.  Nothing improves the symptoms.  Pertinent  Medical History  Chronic back pain Hypertension on ACE inhibitors  Significant Hospital Events: Including procedures, antibiotic start and stop dates in addition to other pertinent events   07/06 admitted for management of mediastinal mass and postobstructive pneumonia 07/07 pulmonary consulted, oncology consulted  Interim History / Subjective:  Patient's only complaint today is that of intractable cough.  Was unable to sleep last night.  Does not endorse any other complaint.  Objective   Blood pressure 137/65, pulse 97, temperature 97.7 F (36.5 C), resp. rate 18, height 5\' 1"  (1.549 m),  weight 45.9 kg, SpO2 96 %.        Intake/Output Summary (Last 24 hours) at 12/04/2021 0814 Last data filed at 12/04/2021 0510 Gross per 24 hour  Intake 750.62 ml  Output --  Net 750.62 ml   Filed Weights   12/03/21 2237 12/04/21 0500  Weight: 45.9 kg 45.9 kg    Examination: GENERAL: Frail-appearing, debilitated woman in no acute respiratory distress but with intractable cough.  Markedly older than stated age. HEAD: Normocephalic, atraumatic.  EYES: Pupils equal, round, reactive to light.  No scleral icterus.  MOUTH: Poor dentition, micrognathia present.  Oral mucosa moist. NECK: Supple. No thyromegaly. Trachea midline. No JVD.  No adenopathy. PULMONARY: Good air entry bilaterally.  Distant breath sounds, no wheezes noted. CARDIOVASCULAR: S1 and S2. Regular rate and rhythm.  No rubs, murmurs or gallops heard. ABDOMEN: Benign. MUSCULOSKELETAL: No joint deformity, no clubbing, no edema.  NEUROLOGIC: No overt focal deficit noted. SKIN: Intact,warm,dry. PSYCH: Anxious, tearful.  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  They participated on the discussion of upcoming procedure.  Labs   CBC: Recent Labs  Lab 12/03/21 1531  WBC 8.8  HGB 9.3*  HCT 29.5*  MCV 70.9*  PLT 472*    Basic Metabolic Panel: Recent Labs  Lab 12/02/21 1321 12/03/21 1531  NA  --  126*  K  --  3.8  CL  --  92*  CO2  --  25  GLUCOSE  --  114*  BUN  --  14  CREATININE 0.70 0.73  CALCIUM  --  8.8*   GFR: Estimated Creatinine Clearance (by C-G formula based on SCr of 0.73 mg/dL) Female: 49.4 mL/min Female: 58.2 mL/min Recent Labs  Lab 12/03/21 1531 12/03/21 2005  PROCALCITON  --  7.10  WBC 8.8  --     Liver Function Tests: No results for input(s): "AST", "ALT", "ALKPHOS", "BILITOT", "PROT", "ALBUMIN" in the last 168 hours. No results for input(s): "LIPASE", "AMYLASE" in the last 168 hours. No results for input(s): "AMMONIA" in the last 168 hours.  ABG No results found for: "PHART", "PCO2ART", "PO2ART", "HCO3", "TCO2", "ACIDBASEDEF", "O2SAT"   Coagulation Profile: No results for input(s): "INR", "PROTIME" in the last 168 hours.  Cardiac Enzymes: No results for input(s): "CKTOTAL", "CKMB", "CKMBINDEX", "TROPONINI" in the last 168 hours.  HbA1C: No results found for: "HGBA1C"  CBG: No results for input(s): "GLUCAP" in the last 168 hours.  Review of Systems:   A 10 point review of systems was performed and it is as  noted above otherwise negative.  Past Medical History:  She,  has a past medical history of Chronic back pain and Hypertension.   Surgical History:   Past Surgical History:  Procedure Laterality Date   BREAST ENHANCEMENT SURGERY     BREAST SURGERY     COLONOSCOPY WITH PROPOFOL N/A 02/15/2019   Procedure: COLONOSCOPY WITH PROPOFOL;  Surgeon: [name], MD;  Location: [facility] ENDOSCOPY;  Service: Endoscopy;  Laterality: N/A;   ESOPHAGOGASTRODUODENOSCOPY (EGD) WITH PROPOFOL N/A 02/15/2019   Procedure: ESOPHAGOGASTRODUODENOSCOPY (EGD) WITH PROPOFOL;  Surgeon: [name], MD;  Location: [facility] ENDOSCOPY;  Service: Endoscopy;  Laterality: N/A;     Social History:   reports that she quit smoking about 3 years ago. Her smoking use included cigarettes. She smoked an average of 1 pack per day. She has never used smokeless tobacco. She reports current alcohol use. She reports current drug use. Drug: Marijuana.  Family History:  Her family history includes Cancer in her mother; Hypertension in her father.   Allergies Allergies  Allergen Reactions   Codeine Nausea And Vomiting     Home Medications  Prior to Admission medications   Medication Sig Start Date End Date Taking? Authorizing Provider  ACETAMINOPHEN 8  HOUR 650 MG CR tablet Take 1-2 tablets by mouth every 8 (eight) hours as needed. 11/13/21  Yes [provider]  albuterol (VENTOLIN HFA) 108 (90 Base) MCG/ACT inhaler Inhale 1 puff into the lungs every 4 (four) hours as needed. 11/23/21  Yes [provider]  amLODipine (NORVASC) 10 MG tablet Take 10 mg by mouth daily. 08/10/21  Yes [provider]  amLODipine (NORVASC) 2.5 MG tablet Take 5 mg by mouth.    Yes [provider]  cyclobenzaprine (FLEXERIL) 10 MG tablet Take 1 tablet (10 mg total) by mouth 3 (three) times daily as needed for muscle spasms. 04/09/20 12/04/21 Yes Milinda Pointer, MD  gabapentin (NEURONTIN) 800 MG tablet Take 800  mg by mouth 4 (four) times daily. 09/29/21  Yes [provider]  lisinopril (PRINIVIL,ZESTRIL) 20 MG tablet Take 1 tablet (20 mg total) by mouth daily. 10/29/15  Yes Fritzi Mandes, MD  nabumetone (RELAFEN) 500 MG tablet Take 500 mg by mouth 2 (two) times daily. 11/05/21  Yes [provider]  traZODone (DESYREL) 100 MG tablet Take 100 mg by mouth at bedtime.  10/27/15  Yes [provider]  celecoxib (CELEBREX) 100 MG capsule Take 1 capsule (100 mg total) by mouth 2 (two) times daily. 04/09/20 07/08/20  Milinda Pointer, MD  escitalopram (LEXAPRO) 20 MG tablet Take 20 mg by mouth daily. Patient not taking: Reported on 12/04/2021 11/30/21   [provider]  gabapentin (NEURONTIN) 600 MG tablet Take 1 tablet (600 mg total) by mouth 3 (three) times daily. Patient not taking: Reported on 12/04/2021 04/09/20 12/04/21  Milinda Pointer, MD  omeprazole (PRILOSEC) 20 MG capsule Take 1 capsule (20 mg total) by mouth 2 (two) times daily for 14 days. 02/20/19 05/09/19  [name], MD     Scheduled Meds:  arformoterol  15 mcg Nebulization BID   budesonide (PULMICORT) nebulizer solution  0.25 mg Nebulization BID   docusate sodium  100 mg Oral BID   gabapentin  800 mg Oral QID   heparin  5,000 Units Subcutaneous Q8H   [START ON 12/05/2021] irbesartan  75 mg Oral Daily   lidocaine  1 patch Transdermal Q24H   methylPREDNISolone (SOLU-MEDROL) injection  60 mg Intravenous Q12H   Followed by   Derrill Memo ON 12/05/2021] predniSONE  40 mg Oral Q breakfast   nicotine  14 mg Transdermal Daily   revefenacin  175 mcg Nebulization Daily   sodium chloride flush  3 mL Intravenous Q12H   traZODone  100 mg Oral QHS   Continuous Infusions: PRN Meds:.acetaminophen **OR** acetaminophen, albuterol, benzonatate, bisacodyl, chlorpheniramine-HYDROcodone, cyclobenzaprine, guaiFENesin, hydrALAZINE, HYDROcodone-acetaminophen, ondansetron **OR** ondansetron (ZOFRAN) IV, polyethylene glycol   Level 4  consult    Benefits, limitations and potential complications of the procedure were discussed with the patient/family (son, SO).  Complications from bronchoscopy are rare and most often minor, but if they occur they may include breathing difficulty, vocal cord spasm, hoarseness, slight fever, vomiting, dizziness, bronchospasm, infection, low blood oxygen, bleeding from biopsy site, or an allergic reaction to medications.  It is uncommon for patients to experience other more serious complications for example: Collapsed lung requiring chest tube placement, respiratory failure, heart attack and/or cardiac arrhythmia.  Patient agrees to proceed.  Renold Don, MD Advanced Bronchoscopy PCCM [facility]    *This note was dictated using voice recognition software/Dragon.  Despite best efforts to proofread, errors can occur which can change the meaning. Any transcriptional errors that result from this process are unintentional and may  not be fully corrected at the time of dictation.

## 2021-12-04 NOTE — Assessment & Plan Note (Signed)
    Latest Ref Rng & Units 12/03/2021    3:31 PM 02/01/2019    3:22 PM 12/23/2016    4:15 PM  CBC  WBC 4.0 - 10.5 K/uL 8.8  9.6  6.5   Hemoglobin 12.0 - 15.0 g/dL 9.3  11.5  12.8   Hematocrit 36.0 - 46.0 % 29.5  34.4  39.9   Platelets 150 - 400 K/uL 472  274  325    New we will follow . Type and screen. Iv ppi.

## 2021-12-04 NOTE — Assessment & Plan Note (Signed)
Attribute to ct finding of suspected lung cancer and postobstructive PNA. With elevated procalcitonin. Supplemental oxygen and duoneb therapy Oncology consult for mediastinal mass evaluation. Marland Kitchen

## 2021-12-04 NOTE — TOC Initial Note (Signed)
Transition of Care [facility]) - Initial/Assessment Note    Patient Details  Name: Deanna Jacobson MRN: [id number] Date of Birth: 12-03-1955  [facility] of [facility]) CM/SW Contact:    Pete Pelt, RN Phone Number: 12/04/2021, 3:25 PM  Clinical Narrative:     RNCM spoke with patient at bedside.  Patient engaged and appropriate, but appeared sad.    Patient lives with her boyfriend, who can assist her at home.  Patient reports no challenges with medications or transportation to appointments.  She states she has a PCP  Aycock, Ngwe A, MD, who she sees on a regular basis.  Patient states she has DME to assist her at home, and she does not feel she has any needs at this time.  She states she has seen the chaplain, and would like this to be ongoing occurrence.  Patient states she does not feel she needs chaplain now, but will ask her care team when she feels the need.             Expected Discharge Plan: Home/Self Care Barriers to Discharge: Continued Medical Work up   Patient Goals and CMS Choice        Expected Discharge Plan and Services Expected Discharge Plan: Home/Self Care In-house Referral: Chaplain Discharge Planning Services: CM Consult   Living arrangements for the past 2 months: Single Family Home                                      Prior Living Arrangements/Services Living arrangements for the past 2 months: Single Family Home Lives with:: Self, Significant Other Patient language and need for interpreter reviewed:: Yes (No interpreter required) Do you feel safe going back to the place where you live?: Yes      Need for Family Participation in Patient Care: Yes (Comment) Care giver support system in place?: Yes (comment)   Criminal Activity/Legal Involvement Pertinent to Current Situation/Hospitalization: No - Comment as needed  Activities of Daily Living Home Assistive Devices/Equipment: Dentures (specify type) ADL Screening (condition at time of  admission) Patient's cognitive ability adequate to safely complete daily activities?: No Is the patient deaf or have difficulty hearing?: No Does the patient have difficulty seeing, even when wearing glasses/contacts?: No Does the patient have difficulty concentrating, remembering, or making decisions?: No Patient able to express need for assistance with ADLs?: Yes Does the patient have difficulty dressing or bathing?: No Independently performs ADLs?: Yes (appropriate for developmental age) Does the patient have difficulty walking or climbing stairs?: Yes Weakness of Legs: Both Weakness of Arms/Hands: None  Permission Sought/Granted Permission sought to share information with : Case Manager Permission granted to share information with : Yes, Verbal Permission Granted              Emotional Assessment Appearance:: Appears stated age Attitude/Demeanor/Rapport: Engaged Affect (typically observed): Sad Orientation: : Oriented to Self, Oriented to Place, Oriented to  Time, Oriented to Situation Alcohol / Substance Use: Not Applicable Psych Involvement: No (comment)  Admission diagnosis:  Hyponatremia [E87.1] Pericardial effusion [I31.39] SOB (shortness of breath) [R06.02] Malignant neoplasm of lung, unspecified laterality, unspecified part of lung (HCC) [C34.90] Patient Active Problem List   Diagnosis Date Noted   Hyponatremia 12/04/2021   Malnutrition of moderate degree 12/04/2021   Palliative care encounter    SOB (shortness of breath) 12/03/2021   Psychiatric disorder 04/09/2020   Iron deficiency anemia  Reflux esophagitis    Hiatal hernia    Stomach irritation    Encounter for colorectal cancer screening    Polyp of colon    Hypertrophy of anal papillae    Spondylosis without myelopathy or radiculopathy, lumbosacral region 01/04/2019   Acute postoperative pain 01/04/2019   Osteoarthritis involving multiple joints 12/11/2018   Pharmacologic therapy 06/13/2018    Disorder of skeletal system 06/13/2018   Problems influencing health status 06/13/2018   Spondylosis without myelopathy or radiculopathy, thoracolumbar region 12/27/2017   Chronic thoracic back pain (Bilateral) 12/27/2017   Lumbar spondylosis 12/13/2017   Lumbar facet syndrome (Bilateral) 04/14/2017   Marijuana use 11/09/2016   Thoracic spondylosis 08/10/2016   Chronic pain syndrome 06/30/2016   Musculoskeletal pain 12/30/2015   Neurogenic pain 12/30/2015   Osteoarthritis 12/30/2015   Long term current use of opiate analgesic 11/20/2015   Long term prescription opiate use 11/20/2015   Opiate use 11/20/2015   Encounter for therapeutic drug level monitoring 11/20/2015   Encounter for pain management planning 11/20/2015   Chronic thoracic spine pain (Primary Area of Pain) (midline) 11/20/2015   Thoracic facet syndrome (Bilateral) 11/20/2015   Lumbar facet arthropathy ([place]) 11/20/2015   Acute renal failure (ARF) ([place]) 10/28/2015   PCP:  Donnie Coffin, MD Pharmacy:   [id number] - [place], [place] - [address] [place] [address] Phone: [phone] Fax: [place] [phone]), [place] - [place]) [place] [address] Phone: [phone] Fax: [phone]     Social Determinants of Health ([place]) Interventions    Readmission Risk Interventions    12/04/2021    3:23 PM  Readmission Risk Prevention Plan  Transportation Screening Complete  PCP or Specialist Appt within 5-7 Days Complete  Home Care Screening Complete  Medication Review (RN CM) Complete

## 2021-12-04 NOTE — Evaluation (Signed)
Physical Therapy Evaluation Patient Details Name: Deanna Jacobson MRN: [id number] DOB: 02-19-1956 Today's Date: 12/04/2021  History of Present Illness  Pt is a 66 y/o F admitted on 12/03/21 after presenting with c/o SOB & unable to eat or swallow x 2 months. Pt with CT finding of suspected lung CA & postobstructive PNA. PMH: HTN  Clinical Impression  Pt seen for PT evaluation with pt agreeable to tx. Pt reports she's independent with mobility without AD at home, denying falls but endorsing minimal activity for the past 2 months. On this date, pt is mod I for bed mobility & initially requires CGA<>close supervision for gait but fades to mod I without AD & pt able to ambulate 2 laps with SpO2 >/= 90% on room air. Anticipate pt can ambulate with nursing staff/mobility techs while in acute setting but at this time does not need acute PT services. PT to sign off at this time, please re-consult if new needs arise.     Recommendations for follow up therapy are one component of a multi-disciplinary discharge planning process, led by the attending physician.  Recommendations may be updated based on patient status, additional functional criteria and insurance authorization.  Follow Up Recommendations No PT follow up      Assistance Recommended at Discharge PRN  Patient can return home with the following  Assist for transportation;Assistance with cooking/housework;Help with stairs or ramp for entrance    Equipment Recommendations None recommended by PT  Recommendations for Other Services       Functional Status Assessment Patient has had a recent decline in their functional status and demonstrates the ability to make significant improvements in function in a reasonable and predictable amount of time.     Precautions / Restrictions Precautions Precautions: None Restrictions Weight Bearing Restrictions: No      Mobility  Bed Mobility Overal bed mobility: Modified Independent              General bed mobility comments: HOB elevated    Transfers Overall transfer level: Independent Equipment used: None                    Ambulation/Gait Ambulation/Gait assistance:  (CGA<>close supervision fade to mod I) Gait Distance (Feet): 300 Feet Assistive device: None   Gait velocity: slightly decreased     General Gait Details: initially upon standing with pt with slight staggering to R but able to improve balance once she ambulated increased distances  Stairs Stairs:  (pt politely declined, reporting she feels comfort with getting in/out of house upon d/c)          Wheelchair Mobility    Modified Rankin (Stroke Patients Only)       Balance Overall balance assessment: Modified Independent                                           Pertinent Vitals/Pain Pain Assessment Pain Assessment: Faces Faces Pain Scale: Hurts little more Pain Location: chronic low back pain Pain Descriptors / Indicators: Discomfort Pain Intervention(s): Monitored during session, Repositioned    Home Living Family/patient expects to be discharged to:: Private residence Living Arrangements: Spouse/significant other (boyfriend Deanna Jacobson)) Available Help at Discharge: Family Type of Home: House Home Access: Stairs to enter Entrance Stairs-Rails: None Entrance Stairs-Number of Steps: 2   Home Layout: One level        Prior Function Prior Level  of Function : Independent/Modified Independent             Mobility Comments: No falls history, no DME use, reports minimal activity in the house for the past 2 months. ADLs Comments: IND in ADL, although does not drive, does not do much housework, given breathing difficulties     Hand Dominance        Extremity/Trunk Assessment   Upper Extremity Assessment Upper Extremity Assessment: Overall WFL for tasks assessed    Lower Extremity Assessment Lower Extremity Assessment: Overall WFL for tasks assessed        Communication   Communication: No difficulties  Cognition Arousal/Alertness: Awake/alert Behavior During Therapy: WFL for tasks assessed/performed Overall Cognitive Status: Within Functional Limits for tasks assessed                                 General Comments: Fluctuating tearfulness re: situation with PT providing encouragement throughout session, pt pleasant & follows commands        General Comments General comments (skin integrity, edema, etc.): Pt coughing throughout session but improvement & less coughing when upright/ambulating, encouraged pt to sit upright as much as possible. SpO2 >/= 90% on room air throughout session.    Exercises     Assessment/Plan    PT Assessment Patient does not need any further PT services  PT Problem List         PT Treatment Interventions      PT Goals (Current goals can be found in the Care Plan section)  Acute Rehab PT Goals Patient Stated Goal: get better PT Goal Formulation: With patient Time For Goal Achievement: 12/18/21 Potential to Achieve Goals: Good    Frequency       Co-evaluation               AM-PAC PT "6 Clicks" Mobility  Outcome Measure Help needed turning from your back to your side while in a flat bed without using bedrails?: None Help needed moving from lying on your back to sitting on the side of a flat bed without using bedrails?: None Help needed moving to and from a bed to a chair (including a wheelchair)?: None Help needed standing up from a chair using your arms (e.g., wheelchair or bedside chair)?: None Help needed to walk in hospital room?: None Help needed climbing 3-5 steps with a railing? : None 6 Click Score: 24    End of Session   Activity Tolerance: Patient tolerated treatment well Patient left: in bed;with family/visitor present Nurse Communication: Mobility status (O2)      Time: 4656-8127 PT Time Calculation (min) (ACUTE ONLY): 21 min   Charges:   PT  Evaluation $PT Eval Low Complexity: 1 Low          Lavone Nian, PT, DPT 12/04/21, 11:25 AM   Deanna Jacobson 12/04/2021, 11:24 AM
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
## 2021-12-04 NOTE — Telephone Encounter (Signed)
Noted.  Will close encounter.  

## 2021-12-04 NOTE — Assessment & Plan Note (Signed)
Lab Results  Component Value Date   CREATININE 0.73 12/03/2021   CREATININE 0.70 12/02/2021   CREATININE 0.98 06/13/2018  resolved.

## 2021-12-04 NOTE — Progress Notes (Signed)
Initial Nutrition Assessment  DOCUMENTATION CODES:   Non-severe (moderate) malnutrition in context of chronic illness  INTERVENTION:  Encourage adequate PO intake Ensure Enlive po BID, each supplement provides 350 kcal and 20 grams of protein. MVI with minerals daily Snacks BID to encourage adequate PO intake "High Calorie, High Protein Nutrition Therapy" handout added to AVS  NUTRITION DIAGNOSIS:   Moderate Malnutrition related to chronic illness as evidenced by mild fat depletion, moderate fat depletion, severe muscle depletion, moderate muscle depletion, energy intake < 75% for > or equal to 1 month.  GOAL:   Patient will meet greater than or equal to 90% of their needs  MONITOR:   PO intake, Supplement acceptance, Labs, Weight trends  REASON FOR ASSESSMENT:   Consult Other (Comment) (nutrition goals)  ASSESSMENT:   Pt admitted with worsening SOB for weeks d/t recent PNA. PMH significant for HTN   CT findings significant for mediastinal mass suspected to be lung cancer. Pulmonology and Oncology following. Noted plans for bronchoscopy with endobronchial [place] for diagnosis on Monday, as well as initiation of palliative radiation simulation.   Spoke with pt at bedside. Son and significant other at bedside. She became tearful during assessment given recent diagnosis. She endorses a decrease in meal intake within the last 2 months d/t increased SOB and intermittent emesis and coughing up of mucus. She has only been able to tolerate small bites of foods throughout the day rather than meals. Pt denies difficulty chewing or swallowing foods. Reviewed menu with pt and provided Ensure for her to try during visit. Discussed with her importance of adequate nutrition and eating smaller, more frequent nutrient dense meals throughout the day as tolerated. She is agreeable to receiving snacks during admission.   Pt reports suspected wt loss but has not weighed herself recently so she is  uncertain how much wt loss has occurred. Reviewed wt history. Unfortunately, there is limited documentation of wt within the last year. It appears her wt has declined within the last 2 years, uncertain how recently this has occurred. Will continue to monitor throughout admission.   Pt meet criteria for malnutrition. Although diagnosis and symptoms appear to be acute, suspect her malnutrition is more chronic in nature.  Medications: colace, solu-medrol  Labs: sodium 126, Ca 8.8  NUTRITION - FOCUSED PHYSICAL EXAM:  Flowsheet Row Most Recent Value  Orbital Region Moderate depletion  Upper Arm Region Severe depletion  Thoracic and Lumbar Region Mild depletion  Buccal Region No depletion  Temple Region No depletion  Clavicle Bone Region Moderate depletion  Clavicle and Acromion Bone Region Moderate depletion  Scapular Bone Region Moderate depletion  Dorsal Hand Moderate depletion  Patellar Region Severe depletion  Anterior Thigh Region Severe depletion  Posterior Calf Region Moderate depletion  Edema (RD Assessment) None  Hair Reviewed  Eyes Reviewed  Mouth Reviewed  Skin Reviewed  Nails Reviewed      Diet Order:   Diet Order             Diet regular Room service appropriate? Yes; Fluid consistency: Thin  Diet effective now                  EDUCATION NEEDS:   Education needs have been addressed  Skin:  Skin Assessment: Reviewed RN Assessment  Last BM:  7/6  Height:   Ht Readings from Last 1 Encounters:  12/03/21 5\' 1"  (1.549 m)    Weight:   Wt Readings from Last 1 Encounters:  12/04/21 45.9 kg  Ideal Body Weight:  47.7 kg  BMI:  Body mass index is 19.12 kg/m.  Estimated Nutritional Needs:   Kcal:  1500-1700  Protein:  70-85g  Fluid:  >/=1.5L  Clayborne Dana, RDN, LDN Clinical Nutrition

## 2021-12-04 NOTE — Assessment & Plan Note (Signed)
Due to SIADH, most likely.  We will cont  With fluid restriction . Salt tablets.

## 2021-12-04 NOTE — Progress Notes (Signed)
   12/04/21 1400  Clinical Encounter Type  Visited With Patient and family together  Visit Type Initial;Social support  Referral From Palliative care team  Consult/Referral To Chaplain  Spiritual Encounters  Spiritual Needs Grief support;Prayer   Chaplain responded to [facility] consult to provide support to patient who recently received difficult news concerning health. Chaplain consoled through reflective listening, compassionate presence and prayer.

## 2021-12-04 NOTE — Discharge Instructions (Signed)

## 2021-12-04 NOTE — Consult Note (Signed)
[place] at [facility] [phone] [phone]   Name: Deanna Jacobson Date: 12/04/2021 MRN: [id number]  DOB: Nov 07, 1955  Patient Care Team: Donnie Coffin, MD as PCP - General (Family Medicine)    REASON FOR CONSULTATION: Gaylia Kassel is a 66 y.o. adult with multiple medical problems including previous history of tobacco abuse, chronic back pain, and hypertension who was admitted to the hospital on 12/03/2021 with worsening shortness of breath.  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  Patient has had severe anxiety and intractable cough.  Palliative care was consulted to address goals and manage ongoing symptoms.  SOCIAL HISTORY:     reports that she quit smoking about 3 years ago. Her smoking use included cigarettes. She smoked an average of 1 pack per day. She has never used smokeless tobacco. She reports current alcohol use. She reports current drug use. Drug: Marijuana.  Patient is unmarried.  She lives at home with her long-term boyfriend.  She has a son who is involved.  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  ADVANCE DIRECTIVES:  None   CODE STATUS: Full code  PAST MEDICAL HISTORY: Past Medical History:  Diagnosis Date   Chronic back pain    Hypertension     PAST SURGICAL HISTORY:  Past Surgical History:  Procedure Laterality Date   BREAST ENHANCEMENT SURGERY     BREAST SURGERY     COLONOSCOPY WITH PROPOFOL N/A 02/15/2019   Procedure: COLONOSCOPY WITH PROPOFOL;  Surgeon: [name], MD;  Location: [facility] ENDOSCOPY;  Service: Endoscopy;  Laterality: N/A;   ESOPHAGOGASTRODUODENOSCOPY (EGD) WITH PROPOFOL N/A 02/15/2019   Procedure: ESOPHAGOGASTRODUODENOSCOPY (EGD) WITH  PROPOFOL;  Surgeon: [name], MD;  Location: [facility] ENDOSCOPY;  Service: Endoscopy;  Laterality: N/A;    HEMATOLOGY/ONCOLOGY HISTORY:  Oncology History   No history exists.    ALLERGIES:  is allergic to codeine.  MEDICATIONS:  Current Facility-Administered Medications  Medication Dose Route Frequency Provider Last Rate Last Admin   acetaminophen (TYLENOL) tablet 650 mg  650 mg Oral Q6H PRN Para Skeans, MD       Or   acetaminophen (TYLENOL) suppository 650 mg  650 mg Rectal Q6H PRN Para Skeans, MD       albuterol (PROVENTIL) (2.5 MG/3ML) 0.083% nebulizer solution 2.5 mg  2.5 mg Nebulization Q2H PRN Florina Ou V, MD   2.5 mg at 12/04/21 0503   ALPRAZolam (XANAX) tablet 0.25 mg  0.25 mg Oral TID PRN Barb Merino, MD   0.25 mg at 12/04/21 1311   arformoterol (BROVANA) nebulizer solution 15 mcg  15 mcg Nebulization BID Tyler Pita, MD   15 mcg at 12/04/21 1258   benzonatate (TESSALON) capsule 100 mg  100 mg Oral TID PRN Barb Merino, MD       bisacodyl (DULCOLAX) EC tablet 5 mg  5 mg Oral Daily PRN Para Skeans, MD       budesonide (PULMICORT) nebulizer solution 0.25 mg  0.25 mg Nebulization BID Tyler Pita, MD   0.25 mg at 12/04/21 1258   chlorpheniramine-HYDROcodone 10-8 MG/5ML suspension 5 mL  5 mL Oral Q12H PRN Tyler Pita, MD   5 mL at 12/04/21 1313   cyclobenzaprine (FLEXERIL) tablet 10 mg  10 mg Oral  TID PRN Barb Merino, MD       docusate sodium (COLACE) capsule 100 mg  100 mg Oral BID Para Skeans, MD   100 mg at 12/04/21 0952   feeding supplement (ENSURE ENLIVE / ENSURE PLUS) liquid 237 mL  237 mL Oral BID BM Barb Merino, MD   237 mL at 12/04/21 1312   gabapentin (NEURONTIN) capsule 800 mg  800 mg Oral QID Madueme, Elvira C, RPH   800 mg at 12/04/21 1311   guaiFENesin (MUCINEX) 12 hr tablet 600 mg  600 mg Oral BID PRN Para Skeans, MD       heparin injection 5,000 Units  5,000 Units Subcutaneous Q8H Para Skeans, MD   5,000 Units at  12/04/21 1311   hydrALAZINE (APRESOLINE) injection 10 mg  10 mg Intravenous Q4H PRN Para Skeans, MD   10 mg at 12/03/21 2309   HYDROcodone-acetaminophen (NORCO/VICODIN) 5-325 MG per tablet 1-2 tablet  1-2 tablet Oral Q4H PRN Para Skeans, MD   2 tablet at 12/04/21 0501   [START ON 12/05/2021] irbesartan (AVAPRO) tablet 75 mg  75 mg Oral Daily Tyler Pita, MD       lidocaine (LIDODERM) 5 % 1 patch  1 patch Transdermal Q24H Para Skeans, MD   1 patch at 12/03/21 2016   methylPREDNISolone sodium succinate (SOLU-MEDROL) 125 mg/2 mL injection 60 mg  60 mg Intravenous Q12H Para Skeans, MD   60 mg at 12/04/21 0955   Followed by   Derrill Memo ON 12/05/2021] predniSONE (DELTASONE) tablet 40 mg  40 mg Oral Q breakfast Para Skeans, MD       multivitamin with minerals tablet 1 tablet  1 tablet Oral Daily Barb Merino, MD   1 tablet at 12/04/21 1311   nicotine (NICODERM CQ - dosed in mg/24 hours) patch 14 mg  14 mg Transdermal Daily Para Skeans, MD       ondansetron (ZOFRAN) tablet 4 mg  4 mg Oral Q6H PRN Para Skeans, MD       Or   ondansetron (ZOFRAN) injection 4 mg  4 mg Intravenous Q6H PRN Para Skeans, MD       polyethylene glycol (MIRALAX / GLYCOLAX) packet 17 g  17 g Oral Daily PRN Para Skeans, MD       revefenacin (YUPELRI) nebulizer solution 175 mcg  175 mcg Nebulization Daily Tyler Pita, MD   175 mcg at 12/04/21 1258   sodium chloride flush (NS) 0.9 % injection 3 mL  3 mL Intravenous Q12H Florina Ou V, MD   3 mL at 12/04/21 0954   traZODone (DESYREL) tablet 100 mg  100 mg Oral QHS Barb Merino, MD        VITAL SIGNS: BP (!) 151/62 (BP Location: Right Arm)   Pulse 94   Temp 97.7 F (36.5 C)   Resp 19   Ht $R'5\' 1"'PR$  (1.549 m)   Wt 101 lb 3.1 oz (45.9 kg)   SpO2 96%   BMI 19.12 kg/m  Filed Weights   12/03/21 2237 12/04/21 0500  Weight: 101 lb 3.1 oz (45.9 kg) 101 lb 3.1 oz (45.9 kg)    Estimated body mass index is 19.12 kg/m as calculated from the following:    Height as of this encounter: $RemoveBeforeD'5\' 1"'dpBrnAGGivAiBV$  (1.549 m).   Weight as of this encounter: 101 lb 3.1 oz (45.9 kg).  LABS: CBC:    Component Value Date/Time   WBC 8.8  12/03/2021 1531   HGB 9.3 (L) 12/03/2021 1531   HGB 11.5 02/01/2019 1522   HCT 29.5 (L) 12/03/2021 1531   HCT 34.4 02/01/2019 1522   PLT 472 (H) 12/03/2021 1531   PLT 274 02/01/2019 1522   MCV 70.9 (L) 12/03/2021 1531   MCV 89 02/01/2019 1522   NEUTROABS 4.2 12/23/2016 1615   LYMPHSABS 1.5 12/23/2016 1615   MONOABS 0.7 02/01/2016 1514   EOSABS 0.3 12/23/2016 1615   BASOSABS 0.0 12/23/2016 1615   Comprehensive Metabolic Panel:    Component Value Date/Time   NA 126 (L) 12/03/2021 1531   NA 136 06/13/2018 0945   K 3.8 12/03/2021 1531   CL 92 (L) 12/03/2021 1531   CO2 25 12/03/2021 1531   BUN 14 12/03/2021 1531   BUN 10 06/13/2018 0945   CREATININE 0.73 12/03/2021 1531   GLUCOSE 114 (H) 12/03/2021 1531   CALCIUM 8.8 (L) 12/03/2021 1531   AST 12 06/13/2018 0945   ALT 12 (L) 02/02/2017 0948   ALKPHOS 83 06/13/2018 0945   BILITOT <0.2 06/13/2018 0945   PROT 6.2 06/13/2018 0945   ALBUMIN 3.9 06/13/2018 0945    RADIOGRAPHIC STUDIES:  PERFORMANCE STATUS (ECOG) : 2 - Symptomatic, <50% confined to bed  Review of Systems Unless otherwise noted, a complete review of systems is negative.  Physical Exam General: NAD Cardiovascular: regular rate and rhythm Pulmonary: Cough, exertionally labored Extremities: no edema, no joint deformities Skin: no rashes Neurological: Weakness but otherwise nonfocal  IMPRESSION: I met with patient, mother, and sister.  Patient had just had a breathing treatment and speech was somewhat limited by coughing and labored breathing.  Patient clearly recognizes that work-up is consistent with cancer.  However, she became tearful and requested that we not discuss that subject.  Patient has been seen by radiation oncology with plan to start XRT next week.  Additionally, patient is pending  bronchoscopy on Monday.  She is being treated for postobstructive pneumonia.  Symptomatically, patient endorses dyspnea and intractable cough.  Patient recently received dose of Tussionex and it is too soon to tell if that will be effective.  Additionally, patient is on gabapentin which can also be used to palliate intractable cough.  She is on bronchodilators and steroids.  Patient has chronic back pain.  She was started on Norco today but has had some difficulty swallowing pills.  Will rotate from Norco to morphine elixir with indication for both pain and dyspnea.  Discussed environmental changes to palliate her symptoms including cool room temperatures and patient was provided with a bedside fan.  Patient has received dose of alprazolam and feels like it helped with her anxiety.  Patient endorses history of depression but has not recently been on an antidepressant.  She is interested in restarting an antidepressant.  Gabapentin can also be an effective augmenting medication for treatment of anxiety.  PLAN: -Continue current scope of treatment -Agree with Tussionex as needed for cough -Try to maintain cool room air temperature -Bedside fan -Continue gabapentin -Rotate from Norco to morphine elixir as needed for pain/dyspnea -Continue alprazolam -Start Lexapro 10 mg daily -Patient will benefit from further discussions of goals when she is emotionally ready -Chaplain consult -Will follow  Case and plan discussed with Dr. Rogue Bussing   Time Total: 60 minutes  Visit consisted of counseling and education dealing with the complex and emotionally intense issues of symptom management and palliative care in the setting of serious and potentially life-threatening illness.Greater than 50%  of this time  was spent counseling and coordinating care related to the above assessment and plan.  Signed by: Altha Harm, PhD, NP-C

## 2021-12-04 NOTE — Progress Notes (Signed)
PROGRESS NOTE    Deanna Jacobson  IRW:[id number] DOB: 02-05-1956 DOA: 12/03/2021 PCP: Donnie Coffin, MD    Brief Narrative:  66 year old with history of hypertension, previous smoker, recently treated for extended course of antibiotics for pneumonia had a routine CT scan of the chest by primary care physician's office and found to have extensive malignancy and sent to ER.  Patient with generalized symptoms of malaise, fatigue, cough and shortness of breath.  Admitted for symptomatic treatment, diagnosis.  Oncology consultation.   Assessment & Plan:   Suspected lung cancer, postobstructive pneumonia, large mediastinal mass.  Locally invasive lung cancer unless proven otherwise. Radiology and clinical picture consistent with extensive disease on mediastinum, also endobronchial lesions. Oncology was consulted by ER, will await recommendations. Endobronchial lesion can likely be accessible to bronchoscopy, discussed with pulmonary.  For bronchoscopy and biopsy on Monday.  Chest physiotherapy, respiratory therapy, symptomatic treatment cough medications and oxygen to keep saturations more than 90%. Started on Solu-Medrol and a steroid taper for symptom relief. Cough medication regimen. Palliative care consultation for symptom management Consult to radiation therapy Consult oncology and pulmonary Patient remains in the hospital due to symptoms not managed at home. 2D echocardiogram to assess pericardial tumor versus fluid.  Chronic pain syndrome: Patient on Flexeril and gabapentin and Celebrex.  Continued.  We will add low-dose Xanax for relief of anxiety.  Essential hypertension: Blood pressures stable on amlodipine and lisinopril.  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  Monitor.    DVT prophylaxis: heparin injection 5,000 Units Start: 12/03/21 2200   Code Status: Full code Family Communication: Boyfriend at bedside Disposition Plan: Status is: Inpatient Remains  inpatient appropriate because: Inpatient procedures needed     Consultants:  Oncology Pulmonary Radiation oncology  Procedures:  None  Antimicrobials:  None   Subjective: Patient seen and examined.  Very tearful and overwhelmed.  She is scared of dying.  This is too much information for her to process through.  Explained in detail about staged approach, tissue diagnosis and probably radiation treatment for her. Also discussed with oncology at the bedside.  Objective: Vitals:   12/04/21 0434 12/04/21 0500 12/04/21 0735 12/04/21 0753  BP: 123/69  137/65   Pulse: 96  97   Resp: 20  18   Temp: 97.7 F (36.5 C)  97.7 F (36.5 C)   TempSrc:      SpO2: 94%  96% 96%  Weight:  45.9 kg    Height:        Intake/Output Summary (Last 24 hours) at 12/04/2021 1113 Last data filed at 12/04/2021 0510 Gross per 24 hour  Intake 750.62 ml  Output --  Net 750.62 ml   Filed Weights   12/03/21 2237 12/04/21 0500  Weight: 45.9 kg 45.9 kg    Examination:  General: Anxious mood.  Frail.  On room air.  Otherwise comfortable.  Some audible wheezing. Cardiovascular: S1-S2 normal.  Regular rate rhythm. Respiratory: Bilateral audible expiratory wheezes, upper airway noise.  On room air.  Not in obvious respiratory distress. Gastrointestinal: Soft.  Nontender.  Bowel sounds present. Ext: No deformities.  No cyanosis.  No clubbing. Neuro: Intact. Musculoskeletal: No deformities. Skin: Intact. Psych: Anxious mood.    Data Reviewed: I have personally reviewed following labs and imaging studies  CBC: Recent Labs  Lab 12/03/21 1531  WBC 8.8  HGB 9.3*  HCT 29.5*  MCV 70.9*  PLT 761*   Basic Metabolic Panel: Recent Labs  Lab 12/02/21 1321 12/03/21 1531  NA  --  126*  K  --  3.8  CL  --  92*  CO2  --  25  GLUCOSE  --  114*  BUN  --  14  CREATININE 0.70 0.73  CALCIUM  --  8.8*   GFR: Estimated Creatinine Clearance (by C-G formula based on SCr of 0.73 mg/dL) Female: 49.4  mL/min Female: 58.2 mL/min Liver Function Tests: No results for input(s): "AST", "ALT", "ALKPHOS", "BILITOT", "PROT", "ALBUMIN" in the last 168 hours. No results for input(s): "LIPASE", "AMYLASE" in the last 168 hours. No results for input(s): "AMMONIA" in the last 168 hours. Coagulation Profile: No results for input(s): "INR", "PROTIME" in the last 168 hours. Cardiac Enzymes: No results for input(s): "CKTOTAL", "CKMB", "CKMBINDEX", "TROPONINI" in the last 168 hours. BNP (last 3 results) No results for input(s): "PROBNP" in the last 8760 hours. HbA1C: No results for input(s): "HGBA1C" in the last 72 hours. CBG: No results for input(s): "GLUCAP" in the last 168 hours. Lipid Profile: No results for input(s): "CHOL", "HDL", "LDLCALC", "TRIG", "CHOLHDL", "LDLDIRECT" in the last 72 hours. Thyroid Function Tests: No results for input(s): "TSH", "T4TOTAL", "FREET4", "T3FREE", "THYROIDAB" in the last 72 hours. Anemia Panel: No results for input(s): "VITAMINB12", "FOLATE", "FERRITIN", "TIBC", "IRON", "RETICCTPCT" in the last 72 hours. Sepsis Labs: Recent Labs  Lab 12/03/21 2005  PROCALCITON 7.10    Recent Results (from the past 240 hour(s))  SARS Coronavirus 2 by RT PCR (hospital order, performed in [facility] hospital lab) *cepheid single result test* Anterior Nasal Swab     Status: None   Collection Time: 12/03/21  8:05 PM   Specimen: Anterior Nasal Swab  Result Value Ref Range Status   SARS Coronavirus 2 by RT PCR NEGATIVE NEGATIVE Final    Comment: (NOTE) SARS-CoV-2 target nucleic acids are NOT DETECTED.  The SARS-CoV-2 RNA is generally detectable in upper and lower respiratory specimens during the acute phase of infection. The lowest concentration of SARS-CoV-2 viral copies this assay can detect is 250 copies / mL. A negative result does not preclude SARS-CoV-2 infection and should not be used as the sole basis for treatment or other patient management decisions.  A negative  result may occur with improper specimen collection / handling, submission of specimen other than nasopharyngeal swab, presence of viral mutation(s) within the areas targeted by this assay, and inadequate number of viral copies (<250 copies / mL). A negative result must be combined with clinical observations, patient history, and epidemiological information.  Fact Sheet for Patients:   https://www.patel.info/  Fact Sheet for Healthcare Providers: https://hall.com/  This test is not yet approved or  cleared by the [place] FDA and has been authorized for detection and/or diagnosis of SARS-CoV-2 by FDA under an Emergency Use Authorization (EUA).  This EUA will remain in effect (meaning this test can be used) for the duration of the COVID-19 declaration under Section 564(b)(1) of the Act, 21 U.S.C. section 360bbb-3(b)(1), unless the authorization is terminated or revoked sooner.  Performed at [facility], [address] [place], [place] [address]          Radiology Studies: Reviewed from radiology reports.      Scheduled Meds:  arformoterol  15 mcg Nebulization BID   budesonide (PULMICORT) nebulizer solution  0.25 mg Nebulization BID   docusate sodium  100 mg Oral BID   gabapentin  800 mg Oral QID   heparin  5,000 Units Subcutaneous Q8H   [START ON 12/05/2021] irbesartan  75 mg Oral Daily  lidocaine  1 patch Transdermal Q24H   methylPREDNISolone (SOLU-MEDROL) injection  60 mg Intravenous Q12H   Followed by   Derrill Memo ON 12/05/2021] predniSONE  40 mg Oral Q breakfast   nicotine  14 mg Transdermal Daily   revefenacin  175 mcg Nebulization Daily   sodium chloride flush  3 mL Intravenous Q12H   traZODone  100 mg Oral QHS   Continuous Infusions:   LOS: 1 day    Time spent: 35 minutes    Barb Merino, MD [facility] Pager [phone]

## 2021-12-05 DIAGNOSIS — C3490 Malignant neoplasm of unspecified part of unspecified bronchus or lung: Secondary | ICD-10-CM

## 2021-12-05 DIAGNOSIS — R0602 Shortness of breath: Secondary | ICD-10-CM | POA: Diagnosis not present

## 2021-12-05 LAB — BASIC METABOLIC PANEL
Anion gap: 9 (ref 5–15)
BUN: 11 mg/dL (ref 8–23)
CO2: 27 mmol/L (ref 22–32)
Calcium: 9.1 mg/dL (ref 8.9–10.3)
Chloride: 95 mmol/L — ABNORMAL LOW (ref 98–111)
Creatinine, Ser: 0.71 mg/dL (ref 0.44–1.00)
GFR, Estimated: >60 mL/min (ref >60)
Glucose, Bld: 96 mg/dL (ref 70–99)
Potassium: 4.1 mmol/L (ref 3.5–5.1)
Sodium: 131 mmol/L — ABNORMAL LOW (ref 135–145)

## 2021-12-05 LAB — IRON AND TIBC
Iron: 24 ug/dL — ABNORMAL LOW (ref 28–170)
Saturation Ratios: 6 % — ABNORMAL LOW (ref 10.4–31.8)
TIBC: 393 ug/dL (ref 250–450)
UIBC: 369 ug/dL

## 2021-12-05 LAB — LACTATE DEHYDROGENASE: LDH: 181 U/L (ref 98–192)

## 2021-12-05 LAB — FERRITIN: Ferritin: 20 ng/mL (ref 11–307)

## 2021-12-05 MED ORDER — DEXAMETHASONE SODIUM PHOSPHATE 4 MG/ML IJ SOLN
4.0000 mg | Freq: Two times a day (BID) | INTRAMUSCULAR | Status: DC
  Administered 2021-12-05 – 2021-12-11 (×13): 4 mg via INTRAVENOUS
  Filled 2021-12-05 (×13): qty 1

## 2021-12-05 NOTE — Progress Notes (Signed)
PROGRESS NOTE    Deanna Jacobson  WOE:[id number] DOB: 05-22-1956 DOA: 12/03/2021 PCP: Donnie Coffin, MD    Brief Narrative:  66 year old with history of hypertension, previous smoker, recently treated for extended course of antibiotics for pneumonia had a routine CT scan of the chest by primary care physician's office and found to have extensive malignancy and sent to ER.  Patient with generalized symptoms of malaise, fatigue, cough and shortness of breath.  Admitted for symptomatic treatment, diagnosis.  Oncology consultation.   Assessment & Plan:   Suspected lung cancer, postobstructive pneumonia, large mediastinal mass.  Locally invasive lung cancer unless proven otherwise. Radiology and clinical picture consistent with extensive disease on mediastinum, also endobronchial lesions. Seen by oncology, pulmonary and radiation oncology. Bronchoscopy on 7/10 with biopsy.  Radiation simulation planned. Presumptive antibiotic, procalcitonin more than 7.  Chest physiotherapy, respiratory therapy, symptomatic treatment cough medications and oxygen to keep saturations more than 90%. Dexamethasone per symptoms. Cough medication regimen. Palliative care consultation for symptom management, currently on morphine and Xanax and helping. Patient remains in the hospital due to symptoms not managed at home. 2D echocardiogram with trivial pericardial effusion.  Chronic pain syndrome: Patient on Flexeril and gabapentin and Celebrex.  Continued.  We will add low-dose Xanax for relief of anxiety. Taking liquid morphine that is helping.  Essential hypertension: Blood pressures stable on amlodipine and lisinopril.  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  Stable.    DVT prophylaxis: heparin injection 5,000 Units Start: 12/04/21 2200   Code Status: Full code Family Communication: None today. Disposition Plan: Status is: Inpatient Remains inpatient appropriate because: Inpatient  procedures needed     Consultants:  Oncology Pulmonary Radiation oncology  Procedures:  None  Antimicrobials:  None   Subjective:  Seen and examined.  Her voice is somewhat improved today after use of steroids and breathing treatments.  Cough but no sputum production.  Afebrile.  Still overwhelmed with the diagnosis, however symptoms are better controlled and she was able to keep up conversation.  Walking around in the room.  On room air today.  Objective: Vitals:   12/05/21 0500 12/05/21 0502 12/05/21 0721 12/05/21 0746  BP:  115/66 (!) 113/57   Pulse:  99 88   Resp:  16 17   Temp:  (!) 97.5 F (36.4 C) (!) 97.5 F (36.4 C)   TempSrc:  Oral    SpO2:  90% 91% 91%  Weight: 46.6 kg     Height:        Intake/Output Summary (Last 24 hours) at 12/05/2021 1204 Last data filed at 12/05/2021 0303 Gross per 24 hour  Intake 100 ml  Output --  Net 100 ml   Filed Weights   12/03/21 2237 12/04/21 0500 12/05/21 0500  Weight: 45.9 kg 45.9 kg 46.6 kg    Examination:  General: Anxious.  Frail and debilitated.  On room air.  Not in any distress. Cardiovascular: S1-S2 normal.  Regular rate rhythm. Respiratory: Bilateral audible expiratory wheezes, upper airway noise.  On room air.  Not in obvious respiratory distress. Gastrointestinal: Soft.  Nontender.  Bowel sounds present. Ext: No deformities.  No cyanosis.  No clubbing. Neuro: Intact. Musculoskeletal: No deformities. Skin: Intact. Psych: Anxious mood.    Data Reviewed: I have personally reviewed following labs and imaging studies  CBC: Recent Labs  Lab 12/03/21 1531  WBC 8.8  HGB 9.3*  HCT 29.5*  MCV 70.9*  PLT 003*   Basic Metabolic Panel: Recent Labs  Lab 12/02/21  1321 12/03/21 1531 12/05/21 0559  NA  --  126* 131*  K  --  3.8 4.1  CL  --  92* 95*  CO2  --  25 27  GLUCOSE  --  114* 96  BUN  --  14 11  CREATININE 0.70 0.73 0.71  CALCIUM  --  8.8* 9.1   GFR: Estimated Creatinine Clearance (by C-G  formula based on SCr of 0.71 mg/dL) Female: 50.2 mL/min Female: 59.1 mL/min Liver Function Tests: No results for input(s): "AST", "ALT", "ALKPHOS", "BILITOT", "PROT", "ALBUMIN" in the last 168 hours. No results for input(s): "LIPASE", "AMYLASE" in the last 168 hours. No results for input(s): "AMMONIA" in the last 168 hours. Coagulation Profile: No results for input(s): "INR", "PROTIME" in the last 168 hours. Cardiac Enzymes: No results for input(s): "CKTOTAL", "CKMB", "CKMBINDEX", "TROPONINI" in the last 168 hours. BNP (last 3 results) No results for input(s): "PROBNP" in the last 8760 hours. HbA1C: No results for input(s): "HGBA1C" in the last 72 hours. CBG: No results for input(s): "GLUCAP" in the last 168 hours. Lipid Profile: No results for input(s): "CHOL", "HDL", "LDLCALC", "TRIG", "CHOLHDL", "LDLDIRECT" in the last 72 hours. Thyroid Function Tests: No results for input(s): "TSH", "T4TOTAL", "FREET4", "T3FREE", "THYROIDAB" in the last 72 hours. Anemia Panel: Recent Labs    12/05/21 0559  FERRITIN 20  TIBC 393  IRON 24*   Sepsis Labs: Recent Labs  Lab 12/03/21 2005  PROCALCITON 7.10    Recent Results (from the past 240 hour(s))  SARS Coronavirus 2 by RT PCR (hospital order, performed in [facility] hospital lab) *cepheid single result test* Anterior Nasal Swab     Status: None   Collection Time: 12/03/21  8:05 PM   Specimen: Anterior Nasal Swab  Result Value Ref Range Status   SARS Coronavirus 2 by RT PCR NEGATIVE NEGATIVE Final    Comment: (NOTE) SARS-CoV-2 target nucleic acids are NOT DETECTED.  The SARS-CoV-2 RNA is generally detectable in upper and lower respiratory specimens during the acute phase of infection. The lowest concentration of SARS-CoV-2 viral copies this assay can detect is 250 copies / mL. A negative result does not preclude SARS-CoV-2 infection and should not be used as the sole basis for treatment or other patient management decisions.  A  negative result may occur with improper specimen collection / handling, submission of specimen other than nasopharyngeal swab, presence of viral mutation(s) within the areas targeted by this assay, and inadequate number of viral copies (<250 copies / mL). A negative result must be combined with clinical observations, patient history, and epidemiological information.  Fact Sheet for Patients:   https://www.patel.info/  Fact Sheet for Healthcare Providers: https://hall.com/  This test is not yet approved or  cleared by the [place] FDA and has been authorized for detection and/or diagnosis of SARS-CoV-2 by FDA under an Emergency Use Authorization (EUA).  This EUA will remain in effect (meaning this test can be used) for the duration of the COVID-19 declaration under Section 564(b)(1) of the Act, 21 U.S.C. section 360bbb-3(b)(1), unless the authorization is terminated or revoked sooner.  Performed at [facility], [address] [place], [place] [address]          Radiology Studies: Reviewed from radiology reports.      Scheduled Meds:  arformoterol  15 mcg Nebulization BID   budesonide (PULMICORT) nebulizer solution  0.25 mg Nebulization BID   dexamethasone  4 mg Intravenous Q12H   docusate sodium  100 mg Oral BID  escitalopram  10 mg Oral Daily   feeding supplement  237 mL Oral BID BM   gabapentin  800 mg Oral QID   heparin  5,000 Units Subcutaneous Q8H   lidocaine  1 patch Transdermal Q24H   multivitamin with minerals  1 tablet Oral Daily   nicotine  14 mg Transdermal Daily   revefenacin  175 mcg Nebulization Daily   sodium chloride flush  3 mL Intravenous Q12H   traZODone  100 mg Oral QHS   Continuous Infusions:  cefTRIAXone (ROCEPHIN)  IV Stopped (12/04/21 1911)     LOS: 2 days    Time spent: 35 minutes    Barb Merino, MD [facility] Pager [phone]

## 2021-12-06 DIAGNOSIS — R0602 Shortness of breath: Secondary | ICD-10-CM | POA: Diagnosis not present

## 2021-12-06 MED ORDER — SODIUM CHLORIDE 0.9 % IV SOLN: Freq: Once | INTRAVENOUS | Status: AC

## 2021-12-06 NOTE — Progress Notes (Signed)
PROGRESS NOTE    Deanna Jacobson  HWE:[id number] DOB: 02-24-1956 DOA: 12/03/2021 PCP: Donnie Coffin, MD    Brief Narrative:  66 year old with history of hypertension, previous smoker, recently treated for extended course of antibiotics for pneumonia had a routine CT scan of the chest by primary care physician's office and found to have extensive malignancy and sent to ER.  Patient with generalized symptoms of malaise, fatigue, cough and shortness of breath.  Admitted for symptomatic treatment, diagnosis.  Oncology consultation. Remains in the hospital.  Inpatient procedures planned.   Assessment & Plan:   Suspected lung cancer, postobstructive pneumonia, large mediastinal mass.  Locally invasive lung cancer unless proven otherwise. Radiology and clinical picture consistent with extensive disease on mediastinum, also endobronchial lesions. Seen by oncology, pulmonary and radiation oncology. Bronchoscopy on 7/10 with biopsy.  Radiation simulation planned. Presumptive antibiotic, procalcitonin more than 7.  We will continue until procedure.  Chest physiotherapy, respiratory therapy, symptomatic treatment cough medications and oxygen to keep saturations more than 90%. Dexamethasone for symptomatic treatment. Cough medication regimen. Palliative care consultation for symptom management, currently on morphine and Xanax and helping. Patient remains in the hospital due to symptoms not managed at home. 2D echocardiogram with trivial pericardial effusion.  Chronic pain syndrome: Patient on Flexeril and gabapentin and Celebrex.  Continued.   Patient is using low-dose Xanax and liquid morphine that is helping her relieve anxiety and pain.    Essential hypertension: Blood pressures stable on amlodipine and lisinopril.  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  Stable.    DVT prophylaxis: heparin injection 5,000 Units Start: 12/04/21 2200   Code Status: Full code Family  Communication: None today. Disposition Plan: Status is: Inpatient Remains inpatient appropriate because: Inpatient procedures needed     Consultants:  Oncology Pulmonary Radiation oncology  Procedures:  None  Antimicrobials:  Rocephin, 7/7---   Subjective:  Patient seen and examined.  Got some rest overnight but did not get adequate sleep.  She has been napping at the daytime.  Voice is more clear.  No cough.  Audible wheezing.  Less anxious and more composed today. Inquired about type of procedure tomorrow, explained to her including possible complications including but not limited to complications of procedures, complications with anesthesia, internal bleeding and possibility of going on to ventilator.  Patient was more comfortable today talking about the procedure.  Objective: Vitals:   12/06/21 0500 12/06/21 0508 12/06/21 0750 12/06/21 0857  BP:  (!) 150/84  137/75  Pulse:  99  95  Resp:  16    Temp:  98 F (36.7 C)  98.1 F (36.7 C)  TempSrc:    Oral  SpO2:  97% 96% 96%  Weight: 49.1 kg     Height:        Intake/Output Summary (Last 24 hours) at 12/06/2021 1139 Last data filed at 12/06/2021 0246 Gross per 24 hour  Intake 100 ml  Output --  Net 100 ml   Filed Weights   12/04/21 0500 12/05/21 0500 12/06/21 0500  Weight: 45.9 kg 46.6 kg 49.1 kg    Examination:  General: Anxious.  Frail and debilitated.  On room air.  Not in any distress.  Walking around. Cardiovascular: S1-S2 normal.  Regular rate rhythm. Respiratory: Bilateral audible expiratory wheezes, upper airway noise.  On room air.  Not in obvious respiratory distress. Gastrointestinal: Soft.  Nontender.  Bowel sounds present. Ext: No deformities.  No cyanosis.  No clubbing. Neuro: Intact. Musculoskeletal: No deformities. Skin: Intact.  Data Reviewed: I have personally reviewed following labs and imaging studies  CBC: Recent Labs  Lab 12/03/21 1531  WBC 8.8  HGB 9.3*  HCT 29.5*  MCV  70.9*  PLT 244*   Basic Metabolic Panel: Recent Labs  Lab 12/02/21 1321 12/03/21 1531 12/05/21 0559  NA  --  126* 131*  K  --  3.8 4.1  CL  --  92* 95*  CO2  --  25 27  GLUCOSE  --  114* 96  BUN  --  14 11  CREATININE 0.70 0.73 0.71  CALCIUM  --  8.8* 9.1   GFR: Estimated Creatinine Clearance (by C-G formula based on SCr of 0.71 mg/dL) Female: 51.5 mL/min Female: 62.2 mL/min Liver Function Tests: No results for input(s): "AST", "ALT", "ALKPHOS", "BILITOT", "PROT", "ALBUMIN" in the last 168 hours. No results for input(s): "LIPASE", "AMYLASE" in the last 168 hours. No results for input(s): "AMMONIA" in the last 168 hours. Coagulation Profile: No results for input(s): "INR", "PROTIME" in the last 168 hours. Cardiac Enzymes: No results for input(s): "CKTOTAL", "CKMB", "CKMBINDEX", "TROPONINI" in the last 168 hours. BNP (last 3 results) No results for input(s): "PROBNP" in the last 8760 hours. HbA1C: No results for input(s): "HGBA1C" in the last 72 hours. CBG: No results for input(s): "GLUCAP" in the last 168 hours. Lipid Profile: No results for input(s): "CHOL", "HDL", "LDLCALC", "TRIG", "CHOLHDL", "LDLDIRECT" in the last 72 hours. Thyroid Function Tests: No results for input(s): "TSH", "T4TOTAL", "FREET4", "T3FREE", "THYROIDAB" in the last 72 hours. Anemia Panel: Recent Labs    12/05/21 0559  FERRITIN 20  TIBC 393  IRON 24*   Sepsis Labs: Recent Labs  Lab 12/03/21 2005  PROCALCITON 7.10    Recent Results (from the past 240 hour(s))  SARS Coronavirus 2 by RT PCR (hospital order, performed in [facility] hospital lab) *cepheid single result test* Anterior Nasal Swab     Status: None   Collection Time: 12/03/21  8:05 PM   Specimen: Anterior Nasal Swab  Result Value Ref Range Status   SARS Coronavirus 2 by RT PCR NEGATIVE NEGATIVE Final    Comment: (NOTE) SARS-CoV-2 target nucleic acids are NOT DETECTED.  The SARS-CoV-2 RNA is generally detectable in upper and  lower respiratory specimens during the acute phase of infection. The lowest concentration of SARS-CoV-2 viral copies this assay can detect is 250 copies / mL. A negative result does not preclude SARS-CoV-2 infection and should not be used as the sole basis for treatment or other patient management decisions.  A negative result may occur with improper specimen collection / handling, submission of specimen other than nasopharyngeal swab, presence of viral mutation(s) within the areas targeted by this assay, and inadequate number of viral copies (<250 copies / mL). A negative result must be combined with clinical observations, patient history, and epidemiological information.  Fact Sheet for Patients:   https://www.patel.info/  Fact Sheet for Healthcare Providers: https://hall.com/  This test is not yet approved or  cleared by the [place] FDA and has been authorized for detection and/or diagnosis of SARS-CoV-2 by FDA under an Emergency Use Authorization (EUA).  This EUA will remain in effect (meaning this test can be used) for the duration of the COVID-19 declaration under Section 564(b)(1) of the Act, 21 U.S.C. section 360bbb-3(b)(1), unless the authorization is terminated or revoked sooner.  Performed at [facility], [address], [place], [place] [address]          Radiology Studies: Reviewed from  radiology reports.      Scheduled Meds:  arformoterol  15 mcg Nebulization BID   budesonide (PULMICORT) nebulizer solution  0.25 mg Nebulization BID   dexamethasone  4 mg Intravenous Q12H   docusate sodium  100 mg Oral BID   escitalopram  10 mg Oral Daily   feeding supplement  237 mL Oral BID BM   gabapentin  800 mg Oral QID   heparin  5,000 Units Subcutaneous Q8H   lidocaine  1 patch Transdermal Q24H   multivitamin with minerals  1 tablet Oral Daily   nicotine  14 mg Transdermal Daily   revefenacin  175 mcg  Nebulization Daily   sodium chloride flush  3 mL Intravenous Q12H   traZODone  100 mg Oral QHS   Continuous Infusions:  cefTRIAXone (ROCEPHIN)  IV Stopped (12/05/21 1834)     LOS: 3 days    Time spent: 35 minutes    Barb Merino, MD [facility] Pager [phone]

## 2021-12-07 ENCOUNTER — Inpatient Hospital Stay: Payer: Medicare Other | Admitting: Anesthesiology

## 2021-12-07 ENCOUNTER — Inpatient Hospital Stay: Payer: Medicare Other

## 2021-12-07 ENCOUNTER — Encounter: Payer: Self-pay | Admitting: Internal Medicine

## 2021-12-07 ENCOUNTER — Ambulatory Visit: Payer: Medicare Other

## 2021-12-07 ENCOUNTER — Encounter
Admission: EM | Disposition: A | Discharge status: Home / Self Care | Payer: Self-pay | Source: Home / Self Care | Attending: Internal Medicine

## 2021-12-07 DIAGNOSIS — J9859 Other diseases of mediastinum, not elsewhere classified: Secondary | ICD-10-CM

## 2021-12-07 DIAGNOSIS — R0602 Shortness of breath: Secondary | ICD-10-CM | POA: Diagnosis not present

## 2021-12-07 HISTORY — PX: VIDEO BRONCHOSCOPY WITH ENDOBRONCHIAL ULTRASOUND: SHX6177

## 2021-12-07 SURGERY — BRONCHOSCOPY, WITH EBUS
Anesthesia: General

## 2021-12-07 MED ORDER — FENTANYL CITRATE (PF) 100 MCG/2ML IJ SOLN
INTRAMUSCULAR | Status: DC | PRN
  Administered 2021-12-07: 25 ug via INTRAVENOUS

## 2021-12-07 MED ORDER — SODIUM CHLORIDE FLUSH 0.9 % IV SOLN
INTRAVENOUS | Status: AC
  Administered 2021-12-07: 3 mL via INTRAVENOUS
  Filled 2021-12-07: qty 10

## 2021-12-07 MED ORDER — LACTATED RINGERS IV SOLN: INTRAVENOUS | Status: DC | PRN

## 2021-12-07 MED ORDER — ROCURONIUM BROMIDE 10 MG/ML (PF) SYRINGE
PREFILLED_SYRINGE | INTRAVENOUS | Status: AC
Start: 2021-12-07 — End: ?
  Filled 2021-12-07: qty 10

## 2021-12-07 MED ORDER — HEPARIN SODIUM (PORCINE) 5000 UNIT/ML IJ SOLN
5000.0000 [IU] | Freq: Three times a day (TID) | INTRAMUSCULAR | Status: DC
  Administered 2021-12-08 – 2021-12-12 (×14): 5000 [IU] via SUBCUTANEOUS
  Filled 2021-12-07 (×14): qty 1

## 2021-12-07 MED ORDER — LIDOCAINE HCL (PF) 2 % IJ SOLN
INTRAMUSCULAR | Status: AC
  Filled 2021-12-07: qty 5

## 2021-12-07 MED ORDER — ONDANSETRON HCL 4 MG/2ML IJ SOLN
INTRAMUSCULAR | Status: DC | PRN
  Administered 2021-12-07: 4 mg via INTRAVENOUS

## 2021-12-07 MED ORDER — FENTANYL CITRATE (PF) 100 MCG/2ML IJ SOLN
INTRAMUSCULAR | Status: AC
  Filled 2021-12-07: qty 2

## 2021-12-07 MED ORDER — PROPOFOL 10 MG/ML IV BOLUS
INTRAVENOUS | Status: DC | PRN
  Administered 2021-12-07: 80 mg via INTRAVENOUS

## 2021-12-07 MED ORDER — DEXAMETHASONE SODIUM PHOSPHATE 10 MG/ML IJ SOLN
INTRAMUSCULAR | Status: AC
  Filled 2021-12-07: qty 1

## 2021-12-07 MED ORDER — SUGAMMADEX SODIUM 200 MG/2ML IV SOLN
INTRAVENOUS | Status: DC | PRN
  Administered 2021-12-07 (×2): 100 mg via INTRAVENOUS

## 2021-12-07 MED ORDER — ONDANSETRON HCL 4 MG/2ML IJ SOLN
INTRAMUSCULAR | Status: AC
Start: 2021-12-07 — End: ?
  Filled 2021-12-07: qty 2

## 2021-12-07 MED ORDER — ROCURONIUM BROMIDE 100 MG/10ML IV SOLN
INTRAVENOUS | Status: DC | PRN
  Administered 2021-12-07: 30 mg via INTRAVENOUS

## 2021-12-07 MED ORDER — PROPOFOL 10 MG/ML IV BOLUS
INTRAVENOUS | Status: AC
  Filled 2021-12-07: qty 20

## 2021-12-07 MED ORDER — LIDOCAINE HCL (CARDIAC) PF 100 MG/5ML IV SOSY
PREFILLED_SYRINGE | INTRAVENOUS | Status: DC | PRN
  Administered 2021-12-07: 60 mg via INTRAVENOUS

## 2021-12-07 MED ORDER — DEXAMETHASONE SODIUM PHOSPHATE 10 MG/ML IJ SOLN
INTRAMUSCULAR | Status: DC | PRN
  Administered 2021-12-07: 10 mg via INTRAVENOUS

## 2021-12-07 NOTE — Op Note (Signed)
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  Rule out carcinoma.   Consent: (Place X beside choice/s below)  The benefits, risks and possible complications of the procedure were        explained to:  _X_ patient  _X_ patient's family  ___ other:___________  who verbalized understanding and gave:  ___ verbal  ___ written  _X__ verbal and written  ___ telephone  ___ other:________ consent.      Unable to obtain consent; procedure performed on emergent basis.     Other:    Benefits, limitations and potential complications of the procedure were discussed with the patient/family.  Complications from bronchoscopy are rare and most often minor, but if they occur they may include breathing difficulty, vocal cord spasm, hoarseness, slight fever, vomiting, dizziness, bronchospasm, infection, low blood oxygen, bleeding from biopsy site, or an allergic reaction to medications.  It is uncommon for patients to experience other more serious complications for example: Collapsed lung requiring chest tube placement, respiratory failure, heart attack and/or cardiac arrhythmia.  Patient agrees to proceed.    Anesthesia type: General endotracheal  Surgeon: Renold Don, MD Assistant/Scrub: Sullivan Lone, RRT Anesthesiologist/CRNA: Katy Fitch, MD/Laura Claudia Desanctis, CRNA Cytotechnology: [facility]   PROCEDURE DETAILS: Patient was taken to Procedure Room 2 (Bronchoscopy Suite) in the OR area.  Appropriate Timeout performed and correct patient, name, ID and laterality confirmed.  Patient was inducted under general anesthesia and intubated with an 8.0 ET tube without difficulty.  Once the patient was under adequate  general anesthesia a Portex adapter was placed in the ET tube flange.  Through the Portex adapter the Olympus video therapeutic bronchoscope was then advanced.  The visible distal trachea was full, carina appeared splayed with significant bulky extrinsic mass effect.  There was significant narrowing visible on both the right and left main bronchi.  The right mainstem bronchus was narrowed and there was mass noted on the right bronchus intermedius.  Because of significant narrowing the bronchoscope could not be passed much further than bronchus intermedius.  The right middle lobe and lower lobe subsegments appeared to be patent.  Right upper lobe was obscured by extrinsic mass effect.  At this point bronchoscope was brought to the left mainstem and examination revealed tumor at the left mainstem bronchus.  The left mainstem bronchus was significantly narrowed.  This continued through to the lower lobe subsegments which were then patent.  The scope could not be advanced further due to friability of the tissue and narrowing of the lumen.  The left upper lobe and lingula where significantly narrowed by extrinsic compression.  The lesions on the left mainstem bronchus were blanch with a solution of 1-10,000 of epinephrine, 3 mL, endobronchial biopsies were then obtained.  ROSE showed lesional cells.  A total of 10 biopsy specimens were obtained.  Biopsy of the right bronchus intermedius lesion was performed next however after the first sampling the lesion started bleeding briskly and this needed to be lavaged and blanch with a 1-10,000 solution of epinephrine.  It was elected to not sample again due to the bleeding.  There was extensive bulky adenopathy/mass anywhere where the ultrasound was utilized.  The adenopathy/mass appeared to be fairly contiguous.  It was also very vascular.  The SVC appeared narrowed.  We proceeded to sample the precarinal, 4R, lymph node/mass.  ROSE revealed lesional cells.  A total of 6 passes  were performed these were placed in preservative for further analysis.  Having completed this, the EBUS scope was retrieved and again replaced for the therapeutic video bronchoscope.  The airway was then examined thoroughly to ensure adequate hemostasis.  After examination and inspection and noting excellent hemostasis, the patient received 9 mL of 1% lidocaine via bronchial lavage prior to retrieving the bronchoscope.  At this point the procedure was terminated and the patient was allowed to emerge from general anesthesia.  Patient was extubated in the procedure room and transported to the PACU in satisfactory condition.  The patient did not have any complaints of chest pain or shortness of breath postprocedure.  Postprocedure examination shows symmetrical lung sounds.  Postprocedure chest x-ray showed no pneumothorax.  Patient tolerated the procedure well.   SPECIMENS (Sites): (Place X beside choice below)  Specimens Description   No Specimens Obtained     Washings    Lavage    Biopsies    Fine Needle Aspirates    Brushings    Sputum    FINDINGS:   Main carina with significant extrinsic mass effect and narrowing of left and right mainstem bronchi:   Left mainstem bronchus, significantly narrowed, some heme postbiopsy:   Right bronchus intermedius with significant tumor invading:   Post biopsy on the right bronchus intermedius with good hemostasis:   Precarinal (4R lymph node/mass):   Subcarinal adenopathy/mass at least 4 cm x 4 cm:   Needle during sampling of lymphadenopathy/mass:    ESTIMATED BLOOD LOSS: Approximately 5 mL   COMPLICATIONS/RESOLUTION: none, post procedure chest x-ray showed no pneumothorax:       IMPRESSION: Mediastinal mass, with endobronchial invasion, rule out cancer Status post successful endobronchial ultrasound with TBNA  RECOMMENDATION/PLAN:  Await pathology report Patient may resume preprocedure diet/medication Supplemental O2 to  maintain saturations at 92% or better Begin radiation therapy     C. Derrill Kay, MD Advanced Bronchoscopy PCCM [place]    *This note was dictated using voice recognition software/Dragon.  Despite best efforts to proofread, errors can occur which can change the meaning.  Any change was purely unintentional.

## 2021-12-07 NOTE — Progress Notes (Signed)
NAME:  Deanna Jacobson, MRN:  [id number], DOB:  1955/07/12, LOS: 4 ADMISSION DATE:  12/03/2021, CONSULTATION DATE:  04 December 2021 REFERRING MD:  Barb Merino, MD CHIEF COMPLAINT: Lung mass  History of Present Illness:  Patient is a 66 year old former smoker (quit 2019, with medical history as noted below, who presented to [facility] on 6 July with increased shortness of breath.  The patient states that she had had cough for "weeks".  She had completed antibiotics that were given empirically for pneumonia.  She had a chest CT on 5 July showing a large mediastinal mass.  Because of increasing dyspnea and potential postobstructive pneumonia the patient was admitted for further admission and management.  Pulmonary is consulted for potential biopsy of this mass.  The patient states that she has not had any hemoptysis but has had pretty much nonstop cough for several weeks.  On examination today she has almost continual cough.  She has chronic back pain issues and is very debilitated.  She has also noticed weight loss but cannot quantitate.  She has had anorexia.  No hemoptysis.  No purulent sputum production.  No chest pain.  No recent fevers, chills or sweats.  Has significant shortness of breath even at rest.  She has had generalized malaise.  She feels fatigued as well.  Nothing improves the symptoms.  Pertinent  Medical History  Chronic back pain Hypertension on ACE inhibitors  Significant Hospital Events: Including procedures, antibiotic start and stop dates in addition to other pertinent events   07/06 admitted for management of mediastinal mass and postobstructive pneumonia 07/07 pulmonary consulted, oncology consulted 07/10 no events over weekend, patient clear on details of the procedure today  Interim History / Subjective:  Cough better controlled has been able to get sleep.  Looks rested.  Does not endorse any other complaint.  Objective   Blood pressure 126/63, pulse 86, temperature 97.6  F (36.4 C), temperature source Oral, resp. rate 20, height 5\' 1"  (1.549 m), weight 49.4 kg, SpO2 97 %.        Intake/Output Summary (Last 24 hours) at 12/07/2021 0906 Last data filed at 12/06/2021 2100 Gross per 24 hour  Intake 100 ml  Output --  Net 100 ml    Filed Weights   12/05/21 0500 12/06/21 0500 12/07/21 0500  Weight: 46.6 kg 49.1 kg 49.4 kg    Examination: GENERAL: Frail-appearing, debilitated woman in no acute respiratory distress but with intractable cough.  Markedly older than stated age. HEAD: Normocephalic, atraumatic.  EYES: Pupils equal, round, reactive to light.  No scleral icterus.  MOUTH: Poor dentition, micrognathia present.  Oral mucosa moist. NECK: Supple. No thyromegaly. Trachea midline. No JVD.  No adenopathy. PULMONARY: Good air entry bilaterally.  Distant breath sounds, bilateral wheezing noted. CARDIOVASCULAR: S1 and S2. Regular rate and rhythm.  No rubs, murmurs or gallops heard. ABDOMEN: Benign. MUSCULOSKELETAL: No joint deformity, no clubbing, no edema.  NEUROLOGIC: No overt focal deficit noted. SKIN: Intact,warm,dry. PSYCH: Anxious, tearful.  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  They participated on the discussion of upcoming procedure.  Labs   CBC: Recent Labs  Lab 12/03/21 1531  WBC 8.8  HGB 9.3*  HCT 29.5*  MCV 70.9*  PLT 472*     Basic Metabolic Panel: Recent Labs  Lab 12/02/21 1321 12/03/21 1531 12/05/21 0559  NA  --  126* 131*  K  --  3.8 4.1  CL  --  92* 95*  CO2  --  25 27  GLUCOSE  --  114* 96  BUN  --  14 11  CREATININE 0.70 0.73 0.71  CALCIUM  --  8.8* 9.1    GFR: Estimated Creatinine Clearance (by C-G formula based on SCr of 0.71 mg/dL) Female: 51.5 mL/min Female: 62.6 mL/min Recent Labs  Lab 12/03/21 1531 12/03/21 2005  PROCALCITON  --  7.10  WBC 8.8  --      Liver Function Tests: No results for input(s): "AST", "ALT", "ALKPHOS", "BILITOT", "PROT", "ALBUMIN" in the last 168 hours. No results for input(s): "LIPASE", "AMYLASE" in the last 168 hours. No results for input(s): "AMMONIA" in the last 168 hours.  ABG No results found for: "PHART", "PCO2ART", "PO2ART", "HCO3", "TCO2", "ACIDBASEDEF", "O2SAT"   Coagulation Profile: No results for input(s): "INR", "PROTIME" in the last 168 hours.  Cardiac Enzymes: No results for input(s): "CKTOTAL", "CKMB", "CKMBINDEX", "TROPONINI" in the last 168 hours.  HbA1C: No results found for:  "HGBA1C"  CBG: No results for input(s): "GLUCAP" in the last 168 hours.  Review of Systems:   A 10 point review of systems was performed and it is as noted above otherwise negative.  Allergies Allergies  Allergen Reactions   Codeine Nausea And Vomiting     Home Medications  Prior to Admission medications   Medication Sig Start Date End Date Taking? Authorizing Provider  ACETAMINOPHEN 8 HOUR 650 MG CR tablet Take 1-2 tablets by mouth every 8 (eight) hours as needed. 11/13/21  Yes [provider]  albuterol (VENTOLIN HFA) 108 (90 Base) MCG/ACT inhaler Inhale 1 puff into the lungs every 4 (four) hours as needed. 11/23/21  Yes [provider]  amLODipine (NORVASC) 10 MG tablet Take 10 mg by mouth daily. 08/10/21  Yes [provider]  amLODipine (NORVASC) 2.5 MG tablet Take 5 mg by mouth.    Yes [provider]  cyclobenzaprine (FLEXERIL) 10 MG tablet Take 1 tablet (10 mg total) by mouth 3 (three) times daily as needed for muscle spasms. 04/09/20 12/04/21 Yes Milinda Pointer, MD  gabapentin (NEURONTIN) 800 MG tablet Take 800 mg by mouth 4 (four) times daily. 09/29/21  Yes [provider]  lisinopril (PRINIVIL,ZESTRIL) 20 MG tablet Take 1 tablet (  20 mg total) by mouth daily. 10/29/15  Yes Fritzi Mandes, MD  nabumetone (RELAFEN) 500 MG tablet Take 500 mg by mouth 2 (two) times daily. 11/05/21  Yes [provider]  traZODone (DESYREL) 100 MG tablet Take 100 mg by mouth at bedtime.  10/27/15  Yes [provider]  celecoxib (CELEBREX) 100 MG capsule Take 1 capsule (100 mg total) by mouth 2 (two) times daily. 04/09/20 07/08/20  Milinda Pointer, MD  escitalopram (LEXAPRO) 20 MG tablet Take 20 mg by mouth daily. Patient not taking: Reported on 12/04/2021 11/30/21   [provider]  gabapentin (NEURONTIN) 600 MG tablet Take 1 tablet (600 mg total) by mouth 3 (three) times daily. Patient not taking: Reported on 12/04/2021 04/09/20 12/04/21   Milinda Pointer, MD  omeprazole (PRILOSEC) 20 MG capsule Take 1 capsule (20 mg total) by mouth 2 (two) times daily for 14 days. 02/20/19 05/09/19  [name], MD     Scheduled Meds:  arformoterol  15 mcg Nebulization BID   budesonide (PULMICORT) nebulizer solution  0.25 mg Nebulization BID   dexamethasone  4 mg Intravenous Q12H   docusate sodium  100 mg Oral BID   escitalopram  10 mg Oral Daily   feeding supplement  237 mL Oral BID BM   gabapentin  800 mg Oral QID   lidocaine  1 patch Transdermal Q24H   multivitamin with minerals  1 tablet Oral Daily   nicotine  14 mg Transdermal Daily   revefenacin  175 mcg Nebulization Daily   sodium chloride flush  3 mL Intravenous Q12H   traZODone  100 mg Oral QHS   Continuous Infusions:  cefTRIAXone (ROCEPHIN)  IV 2 g (12/06/21 1809)   PRN Meds:.acetaminophen **OR** acetaminophen, albuterol, ALPRAZolam, benzonatate, bisacodyl, chlorpheniramine-HYDROcodone, cyclobenzaprine, guaiFENesin, hydrALAZINE, morphine CONCENTRATE, ondansetron **OR** ondansetron (ZOFRAN) IV, polyethylene glycol   Level 3 follow-up    Benefits, limitations and potential complications of the procedure were discussed with the patient/family (son, SO).  Complications from bronchoscopy are rare and most often minor, but if they occur they may include breathing difficulty, vocal cord spasm, hoarseness, slight fever, vomiting, dizziness, bronchospasm, infection, low blood oxygen, bleeding from biopsy site, or an allergic reaction to medications.  It is uncommon for patients to experience other more serious complications for example: Collapsed lung requiring chest tube placement, respiratory failure, heart attack and/or cardiac arrhythmia.  Patient agrees to proceed.  Patient aware she may need prolonged mechanical ventilation postprocedure.  Renold Don, MD Advanced Bronchoscopy PCCM [facility]    *This note was dictated using voice  recognition software/Dragon.  Despite best efforts to proofread, errors can occur which can change the meaning. Any transcriptional errors that result from this process are unintentional and may not be fully corrected at the time of dictation.

## 2021-12-07 NOTE — Interval H&P Note (Signed)
Patient presents for [place] for diagnosis of a large mediastinal mass.  mass suspicious for carcinoma.  Patient is aware that she may require prolonged mechanical ventilation postprocedure.  Patient is also aware that there is no other means of obtaining biopsy for diagnosis.  Benefits, limitations and potential complications of the procedure were discussed with the patient/family.  Complications from bronchoscopy are rare and most often minor, but if they occur they may include breathing difficulty, vocal cord spasm, hoarseness, slight fever, vomiting, dizziness, bronchospasm, infection, low blood oxygen, bleeding from biopsy site, or an allergic reaction to medications.  It is uncommon for patients to experience other more serious complications for example: Collapsed lung requiring chest tube placement, respiratory failure, heart attack and/or cardiac arrhythmia.   Renold Don, MD Advanced Bronchoscopy PCCM [employer] [facility]    *This note was dictated using voice recognition software/Dragon.  Despite best efforts to proofread, errors can occur which can change the meaning. Any transcriptional errors that result from this process are unintentional and may not be fully corrected at the time of dictation.

## 2021-12-07 NOTE — Anesthesia Preprocedure Evaluation (Signed)
Anesthesia Evaluation  Patient identified by MRN, date of birth, ID band Patient awake    Reviewed: Allergy & Precautions, NPO status , Patient's Chart, lab work & pertinent test results  History of Anesthesia Complications Negative for: history of anesthetic complications  Airway Mallampati: III  TM Distance: <3 FB Neck ROM: full    Dental  (+) Missing   Pulmonary shortness of breath and with exertion, pneumonia, unresolved, COPD, former smoker,    Pulmonary exam normal breath sounds clear to auscultation       Cardiovascular Exercise Tolerance: Poor hypertension, (-) angina+ DOE  Normal cardiovascular exam     Neuro/Psych PSYCHIATRIC DISORDERS  Neuromuscular disease    GI/Hepatic Neg liver ROS, hiatal hernia,   Endo/Other  negative endocrine ROS  Renal/GU Renal disease     Musculoskeletal   Abdominal   Peds  Hematology negative hematology ROS (+)   Anesthesia Other Findings Past Medical History: No date: Chronic back pain No date: Hypertension  Past Surgical History: No date: BREAST ENHANCEMENT SURGERY No date: BREAST SURGERY 02/15/2019: COLONOSCOPY WITH PROPOFOL; N/A     Comment:  Procedure: COLONOSCOPY WITH PROPOFOL;  Surgeon:               [name], MD;  Location: [facility] ENDOSCOPY;                Service: Endoscopy;  Laterality: N/A; 02/15/2019: ESOPHAGOGASTRODUODENOSCOPY (EGD) WITH PROPOFOL; N/A     Comment:  Procedure: ESOPHAGOGASTRODUODENOSCOPY (EGD) WITH               PROPOFOL;  Surgeon: [name], MD;  Location:               [facility] ENDOSCOPY;  Service: Endoscopy;  Laterality: N/A;  BMI    Body Mass Index: 20.58 kg/m      Reproductive/Obstetrics negative OB ROS                             Anesthesia Physical Anesthesia Plan  ASA: 3  Anesthesia Plan: General ETT   Post-op Pain Management:    Induction: Intravenous  PONV Risk Score and Plan:  Ondansetron, Dexamethasone, Midazolam and Treatment may vary due to age or medical condition  Airway Management Planned: Oral ETT  Additional Equipment:   Intra-op Plan:   Post-operative Plan: Extubation in OR  Informed Consent: I have reviewed the patients History and Physical, chart, labs and discussed the procedure including the risks, benefits and alternatives for the proposed anesthesia with the patient or authorized representative who has indicated his/her understanding and acceptance.     Dental Advisory Given  Plan Discussed with: Anesthesiologist, CRNA and Surgeon  Anesthesia Plan Comments: (Patient consented for risks of anesthesia including but not limited to:  - adverse reactions to medications - damage to eyes, teeth, lips or other oral mucosa - nerve damage due to positioning  - sore throat or hoarseness - Damage to heart, brain, nerves, lungs, other parts of body or loss of life  Patient voiced understanding.)        Anesthesia Quick Evaluation

## 2021-12-07 NOTE — Progress Notes (Signed)
PROGRESS NOTE    Deanna Jacobson  OVZ:[id number] DOB: 09-May-1956 DOA: 12/03/2021 PCP: Donnie Coffin, MD    Brief Narrative:  66 year old with history of hypertension, previous smoker, recently treated for extended course of antibiotics for pneumonia had a routine CT scan of the chest by primary care physician's office and found to have extensive malignancy and sent to ER.  Patient with generalized symptoms of malaise, fatigue, cough and shortness of breath.  Admitted for symptomatic treatment, diagnosis.  Oncology consultation. Remains in the hospital.  Inpatient procedures planned. For bronchoscopy biopsy and CT simulation today.   Assessment & Plan:   Suspected lung cancer, postobstructive pneumonia, large mediastinal mass.  Locally invasive lung cancer unless proven otherwise. Radiology and clinical picture consistent with extensive disease on mediastinum, also endobronchial lesions. Seen by oncology, pulmonary and radiation oncology. Bronchoscopy on 7/10 with biopsy.  Radiation simulation planned. Presumptive antibiotic, procalcitonin more than 7.  We will continue until procedure.  Chest physiotherapy, respiratory therapy, symptomatic treatment cough medications and oxygen to keep saturations more than 90%. Dexamethasone for symptomatic treatment. Cough medication regimen. Palliative care consultation for symptom management, currently on morphine and Xanax and helping. Patient remains in the hospital due to symptoms not managed at home. 2D echocardiogram with trivial pericardial effusion.  Chronic pain syndrome: Patient on Flexeril and gabapentin and Celebrex.  Continued.   Patient is using low-dose Xanax and liquid morphine that is helping her relieve anxiety and pain.    Essential hypertension: Blood pressures stable on amlodipine and lisinopril.  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  Stable.  Continue to mobilize in the hallway.  Her symptoms are  improving.  She is going for procedure today.  If is stable after the procedure can likely go home with oral steroids and antibiotic therapy and wait for biopsy results.  I will discuss this with her oncologist.    DVT prophylaxis:   SCDs   Code Status: Full code Family Communication: None today. Disposition Plan: Status is: Inpatient Remains inpatient appropriate because: Inpatient procedures needed     Consultants:  Oncology Pulmonary Radiation oncology  Procedures:  None  Antimicrobials:  Rocephin, 7/7---   Subjective:  Seen and examined.  Much better slept last night.  Afebrile.  Looking forward for procedure.  Objective: Vitals:   12/07/21 0500 12/07/21 0552 12/07/21 0700 12/07/21 0752  BP:  (!) 152/85 126/63   Pulse:  98 86   Resp:  18 20   Temp:  98.1 F (36.7 C) 97.6 F (36.4 C)   TempSrc:  Oral Oral   SpO2:  98% 95% 97%  Weight: 49.4 kg     Height:        Intake/Output Summary (Last 24 hours) at 12/07/2021 0827 Last data filed at 12/06/2021 2100 Gross per 24 hour  Intake 100 ml  Output --  Net 100 ml   Filed Weights   12/05/21 0500 12/06/21 0500 12/07/21 0500  Weight: 46.6 kg 49.1 kg 49.4 kg    Examination:  General: rail and debilitated.  On room air.  Comfortable today. Cardiovascular: S1-S2 normal.  Regular rate rhythm. Respiratory: Bilateral audible expiratory wheezes, upper airway noise.  On room air.  Not in obvious respiratory distress. Gastrointestinal: Soft.  Nontender.  Bowel sounds present. Ext: No deformities.  No cyanosis.  No clubbing. Neuro: Intact. Musculoskeletal: No deformities. Skin: Intact.     Data Reviewed: I have personally reviewed following labs and imaging studies  CBC: Recent Labs  Lab 12/03/21 1531  WBC 8.8  HGB 9.3*  HCT 29.5*  MCV 70.9*  PLT 834*   Basic Metabolic Panel: Recent Labs  Lab 12/02/21 1321 12/03/21 1531 12/05/21 0559  NA  --  126* 131*  K  --  3.8 4.1  CL  --  92* 95*  CO2  --   25 27  GLUCOSE  --  114* 96  BUN  --  14 11  CREATININE 0.70 0.73 0.71  CALCIUM  --  8.8* 9.1   GFR: Estimated Creatinine Clearance (by C-G formula based on SCr of 0.71 mg/dL) Female: 51.5 mL/min Female: 62.6 mL/min Liver Function Tests: No results for input(s): "AST", "ALT", "ALKPHOS", "BILITOT", "PROT", "ALBUMIN" in the last 168 hours. No results for input(s): "LIPASE", "AMYLASE" in the last 168 hours. No results for input(s): "AMMONIA" in the last 168 hours. Coagulation Profile: No results for input(s): "INR", "PROTIME" in the last 168 hours. Cardiac Enzymes: No results for input(s): "CKTOTAL", "CKMB", "CKMBINDEX", "TROPONINI" in the last 168 hours. BNP (last 3 results) No results for input(s): "PROBNP" in the last 8760 hours. HbA1C: No results for input(s): "HGBA1C" in the last 72 hours. CBG: No results for input(s): "GLUCAP" in the last 168 hours. Lipid Profile: No results for input(s): "CHOL", "HDL", "LDLCALC", "TRIG", "CHOLHDL", "LDLDIRECT" in the last 72 hours. Thyroid Function Tests: No results for input(s): "TSH", "T4TOTAL", "FREET4", "T3FREE", "THYROIDAB" in the last 72 hours. Anemia Panel: Recent Labs    12/05/21 0559  FERRITIN 20  TIBC 393  IRON 24*   Sepsis Labs: Recent Labs  Lab 12/03/21 2005  PROCALCITON 7.10    Recent Results (from the past 240 hour(s))  SARS Coronavirus 2 by RT PCR (hospital order, performed in [facility] hospital lab) *cepheid single result test* Anterior Nasal Swab     Status: None   Collection Time: 12/03/21  8:05 PM   Specimen: Anterior Nasal Swab  Result Value Ref Range Status   SARS Coronavirus 2 by RT PCR NEGATIVE NEGATIVE Final    Comment: (NOTE) SARS-CoV-2 target nucleic acids are NOT DETECTED.  The SARS-CoV-2 RNA is generally detectable in upper and lower respiratory specimens during the acute phase of infection. The lowest concentration of SARS-CoV-2 viral copies this assay can detect is 250 copies / mL. A negative  result does not preclude SARS-CoV-2 infection and should not be used as the sole basis for treatment or other patient management decisions.  A negative result may occur with improper specimen collection / handling, submission of specimen other than nasopharyngeal swab, presence of viral mutation(s) within the areas targeted by this assay, and inadequate number of viral copies (<250 copies / mL). A negative result must be combined with clinical observations, patient history, and epidemiological information.  Fact Sheet for Patients:   https://www.patel.info/  Fact Sheet for Healthcare Providers: https://hall.com/  This test is not yet approved or  cleared by the [place] FDA and has been authorized for detection and/or diagnosis of SARS-CoV-2 by FDA under an Emergency Use Authorization (EUA).  This EUA will remain in effect (meaning this test can be used) for the duration of the COVID-19 declaration under Section 564(b)(1) of the Act, 21 U.S.C. section 360bbb-3(b)(1), unless the authorization is terminated or revoked sooner.  Performed at [facility], [address] [place], [place] [address]          Radiology Studies: Reviewed from radiology reports.      Scheduled Meds:  arformoterol  15 mcg Nebulization BID   budesonide (PULMICORT)  nebulizer solution  0.25 mg Nebulization BID   dexamethasone  4 mg Intravenous Q12H   docusate sodium  100 mg Oral BID   escitalopram  10 mg Oral Daily   feeding supplement  237 mL Oral BID BM   gabapentin  800 mg Oral QID   lidocaine  1 patch Transdermal Q24H   multivitamin with minerals  1 tablet Oral Daily   nicotine  14 mg Transdermal Daily   revefenacin  175 mcg Nebulization Daily   sodium chloride flush  3 mL Intravenous Q12H   traZODone  100 mg Oral QHS   Continuous Infusions:  cefTRIAXone (ROCEPHIN)  IV 2 g (12/06/21 1809)     LOS: 4 days    Time spent: 35  minutes    Barb Merino, MD [facility] Pager [phone]

## 2021-12-07 NOTE — Transfer of Care (Addendum)
Immediate Anesthesia Transfer of Care Note  Patient: Deanna Jacobson  Procedure(s) Performed: VIDEO BRONCHOSCOPY WITH ENDOBRONCHIAL ULTRASOUND  Patient Location: PACU  Anesthesia Type:General  Level of Consciousness: awake  Airway & Oxygen Therapy: Patient Spontanous Breathing  Post-op Assessment: Report given to RN  Post vital signs: stable  Last Vitals:  Vitals Value Taken Time  BP 150/72 12/07/21 1415  Temp 36.3 C 12/07/21 1407  Pulse 92 12/07/21 1417  Resp 15 12/07/21 1417  SpO2 95 % 12/07/21 1417  Vitals shown include unvalidated device data.  Last Pain:  Vitals:   12/07/21 1212  TempSrc: Temporal  PainSc: 3       Patients Stated Pain Goal: 0 (05/23/48 7530)  Complications: No notable events documented.

## 2021-12-07 NOTE — Anesthesia Procedure Notes (Signed)
Procedure Name: Intubation Date/Time: 12/07/2021 1:11 PM  Performed by: Zetta Bills, CRNAPre-anesthesia Checklist: Patient identified, Emergency Drugs available, Suction available and Patient being monitored Patient Re-evaluated:Patient Re-evaluated prior to induction Oxygen Delivery Method: Circle system utilized Preoxygenation: Pre-oxygenation with 100% oxygen Induction Type: IV induction Ventilation: Mask ventilation without difficulty Laryngoscope Size: McGraph and 3 Grade View: Grade I Tube type: Oral Tube size: 8.0 mm Number of attempts: 1 Airway Equipment and Method: Stylet and Oral airway Placement Confirmation: ETT inserted through vocal cords under direct vision, positive ETCO2 and breath sounds checked- equal and bilateral Secured at: 20 cm Tube secured with: Tape Dental Injury: Teeth and Oropharynx as per pre-operative assessment

## 2021-12-07 NOTE — H&P (View-Only) (Signed)
NAME:  Deanna Jacobson, MRN:  [id number], DOB:  Dec 04, 1955, LOS: 4 ADMISSION DATE:  12/03/2021, CONSULTATION DATE:  04 December 2021 REFERRING MD:  Barb Merino, MD CHIEF COMPLAINT: Lung mass  History of Present Illness:  Patient is a 66 year old former smoker (quit 2019, with medical history as noted below, who presented to [facility] on 6 July with increased shortness of breath.  The patient states that she had had cough for "weeks".  She had completed antibiotics that were given empirically for pneumonia.  She had a chest CT on 5 July showing a large mediastinal mass.  Because of increasing dyspnea and potential postobstructive pneumonia the patient was admitted for further admission and management.  Pulmonary is consulted for potential biopsy of this mass.  The patient states that she has not had any hemoptysis but has had pretty much nonstop cough for several weeks.  On examination today she has almost continual cough.  She has chronic back pain issues and is very debilitated.  She has also noticed weight loss but cannot quantitate.  She has had anorexia.  No hemoptysis.  No purulent sputum production.  No chest pain.  No recent fevers, chills or sweats.  Has significant shortness of breath even at rest.  She has had generalized malaise.  She feels fatigued as well.  Nothing improves the symptoms.  Pertinent  Medical History  Chronic back pain Hypertension on ACE inhibitors  Significant Hospital Events: Including procedures, antibiotic start and stop dates in addition to other pertinent events   07/06 admitted for management of mediastinal mass and postobstructive pneumonia 07/07 pulmonary consulted, oncology consulted 07/10 no events over weekend, patient clear on details of the procedure today  Interim History / Subjective:  Cough better controlled has been able to get sleep.  Looks rested.  Does not endorse any other complaint.  Objective   Blood pressure 126/63, pulse 86, temperature 97.6  F (36.4 C), temperature source Oral, resp. rate 20, height 5\' 1"  (1.549 m), weight 49.4 kg, SpO2 97 %.        Intake/Output Summary (Last 24 hours) at 12/07/2021 0906 Last data filed at 12/06/2021 2100 Gross per 24 hour  Intake 100 ml  Output --  Net 100 ml    Filed Weights   12/05/21 0500 12/06/21 0500 12/07/21 0500  Weight: 46.6 kg 49.1 kg 49.4 kg    Examination: GENERAL: Frail-appearing, debilitated woman in no acute respiratory distress but with intractable cough.  Markedly older than stated age. HEAD: Normocephalic, atraumatic.  EYES: Pupils equal, round, reactive to light.  No scleral icterus.  MOUTH: Poor dentition, micrognathia present.  Oral mucosa moist. NECK: Supple. No thyromegaly. Trachea midline. No JVD.  No adenopathy. PULMONARY: Good air entry bilaterally.  Distant breath sounds, bilateral wheezing noted. CARDIOVASCULAR: S1 and S2. Regular rate and rhythm.  No rubs, murmurs or gallops heard. ABDOMEN: Benign. MUSCULOSKELETAL: No joint deformity, no clubbing, no edema.  NEUROLOGIC: No overt focal deficit noted. SKIN: Intact,warm,dry. PSYCH: Anxious, tearful.  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  They participated on the discussion of upcoming procedure.  Labs   CBC: Recent Labs  Lab 12/03/21 1531  WBC 8.8  HGB 9.3*  HCT 29.5*  MCV 70.9*  PLT 472*     Basic Metabolic Panel: Recent Labs  Lab 12/02/21 1321 12/03/21 1531 12/05/21 0559  NA  --  126* 131*  K  --  3.8 4.1  CL  --  92* 95*  CO2  --  25 27  GLUCOSE  --  114* 96  BUN  --  14 11  CREATININE 0.70 0.73 0.71  CALCIUM  --  8.8* 9.1    GFR: Estimated Creatinine Clearance (by C-G formula based on SCr of 0.71 mg/dL) Female: 51.5 mL/min Female: 62.6 mL/min Recent Labs  Lab 12/03/21 1531 12/03/21 2005  PROCALCITON  --  7.10  WBC 8.8  --      Liver Function Tests: No results for input(s): "AST", "ALT", "ALKPHOS", "BILITOT", "PROT", "ALBUMIN" in the last 168 hours. No results for input(s): "LIPASE", "AMYLASE" in the last 168 hours. No results for input(s): "AMMONIA" in the last 168 hours.  ABG No results found for: "PHART", "PCO2ART", "PO2ART", "HCO3", "TCO2", "ACIDBASEDEF", "O2SAT"   Coagulation Profile: No results for input(s): "INR", "PROTIME" in the last 168 hours.  Cardiac Enzymes: No results for input(s): "CKTOTAL", "CKMB", "CKMBINDEX", "TROPONINI" in the last 168 hours.  HbA1C: No results found for:  "HGBA1C"  CBG: No results for input(s): "GLUCAP" in the last 168 hours.  Review of Systems:   A 10 point review of systems was performed and it is as noted above otherwise negative.  Allergies Allergies  Allergen Reactions   Codeine Nausea And Vomiting     Home Medications  Prior to Admission medications   Medication Sig Start Date End Date Taking? Authorizing Provider  ACETAMINOPHEN 8 HOUR 650 MG CR tablet Take 1-2 tablets by mouth every 8 (eight) hours as needed. 11/13/21  Yes [provider]  albuterol (VENTOLIN HFA) 108 (90 Base) MCG/ACT inhaler Inhale 1 puff into the lungs every 4 (four) hours as needed. 11/23/21  Yes [provider]  amLODipine (NORVASC) 10 MG tablet Take 10 mg by mouth daily. 08/10/21  Yes [provider]  amLODipine (NORVASC) 2.5 MG tablet Take 5 mg by mouth.    Yes [provider]  cyclobenzaprine (FLEXERIL) 10 MG tablet Take 1 tablet (10 mg total) by mouth 3 (three) times daily as needed for muscle spasms. 04/09/20 12/04/21 Yes Milinda Pointer, MD  gabapentin (NEURONTIN) 800 MG tablet Take 800 mg by mouth 4 (four) times daily. 09/29/21  Yes [provider]  lisinopril (PRINIVIL,ZESTRIL) 20 MG tablet Take 1 tablet (  20 mg total) by mouth daily. 10/29/15  Yes Fritzi Mandes, MD  nabumetone (RELAFEN) 500 MG tablet Take 500 mg by mouth 2 (two) times daily. 11/05/21  Yes [provider]  traZODone (DESYREL) 100 MG tablet Take 100 mg by mouth at bedtime.  10/27/15  Yes [provider]  celecoxib (CELEBREX) 100 MG capsule Take 1 capsule (100 mg total) by mouth 2 (two) times daily. 04/09/20 07/08/20  Milinda Pointer, MD  escitalopram (LEXAPRO) 20 MG tablet Take 20 mg by mouth daily. Patient not taking: Reported on 12/04/2021 11/30/21   [provider]  gabapentin (NEURONTIN) 600 MG tablet Take 1 tablet (600 mg total) by mouth 3 (three) times daily. Patient not taking: Reported on 12/04/2021 04/09/20 12/04/21   Milinda Pointer, MD  omeprazole (PRILOSEC) 20 MG capsule Take 1 capsule (20 mg total) by mouth 2 (two) times daily for 14 days. 02/20/19 05/09/19  [name], MD     Scheduled Meds:  arformoterol  15 mcg Nebulization BID   budesonide (PULMICORT) nebulizer solution  0.25 mg Nebulization BID   dexamethasone  4 mg Intravenous Q12H   docusate sodium  100 mg Oral BID   escitalopram  10 mg Oral Daily   feeding supplement  237 mL Oral BID BM   gabapentin  800 mg Oral QID   lidocaine  1 patch Transdermal Q24H   multivitamin with minerals  1 tablet Oral Daily   nicotine  14 mg Transdermal Daily   revefenacin  175 mcg Nebulization Daily   sodium chloride flush  3 mL Intravenous Q12H   traZODone  100 mg Oral QHS   Continuous Infusions:  cefTRIAXone (ROCEPHIN)  IV 2 g (12/06/21 1809)   PRN Meds:.acetaminophen **OR** acetaminophen, albuterol, ALPRAZolam, benzonatate, bisacodyl, chlorpheniramine-HYDROcodone, cyclobenzaprine, guaiFENesin, hydrALAZINE, morphine CONCENTRATE, ondansetron **OR** ondansetron (ZOFRAN) IV, polyethylene glycol   Level 3 follow-up    Benefits, limitations and potential complications of the procedure were discussed with the patient/family (son, SO).  Complications from bronchoscopy are rare and most often minor, but if they occur they may include breathing difficulty, vocal cord spasm, hoarseness, slight fever, vomiting, dizziness, bronchospasm, infection, low blood oxygen, bleeding from biopsy site, or an allergic reaction to medications.  It is uncommon for patients to experience other more serious complications for example: Collapsed lung requiring chest tube placement, respiratory failure, heart attack and/or cardiac arrhythmia.  Patient agrees to proceed.  Patient aware she may need prolonged mechanical ventilation postprocedure.  Renold Don, MD Advanced Bronchoscopy [facility]    *This note was dictated using voice  recognition software/Dragon.  Despite best efforts to proofread, errors can occur which can change the meaning. Any transcriptional errors that result from this process are unintentional and may not be fully corrected at the time of dictation.

## 2021-12-07 NOTE — Plan of Care (Signed)

## 2021-12-07 NOTE — Progress Notes (Signed)
Patient gone for bronchoscopy.  I spoke to patient's sister-in-law and the mother in the room.  I explained to them regarding the significant concerns for malignancy based on imaging causing her significant symptoms of cough shortness of breath facial swelling.  However await bronchoscopy this afternoon.  Also discussed the plan for proceeding with possible radiation depending biopsy given concern for vascular compromise/SVC.

## 2021-12-08 ENCOUNTER — Inpatient Hospital Stay: Payer: Medicare Other

## 2021-12-08 ENCOUNTER — Ambulatory Visit: Payer: Medicare Other | Attending: Radiation Oncology

## 2021-12-08 ENCOUNTER — Encounter: Payer: Self-pay | Admitting: Pulmonary Disease

## 2021-12-08 ENCOUNTER — Inpatient Hospital Stay: Admit: 2021-12-08 | Payer: Medicare Other

## 2021-12-08 DIAGNOSIS — R0602 Shortness of breath: Secondary | ICD-10-CM | POA: Diagnosis not present

## 2021-12-08 LAB — CBC
HCT: 26.8 % — ABNORMAL LOW (ref 36.0–46.0)
Hemoglobin: 8.1 g/dL — ABNORMAL LOW (ref 12.0–15.0)
MCH: 22.4 pg — ABNORMAL LOW (ref 26.0–34.0)
MCHC: 30.2 g/dL (ref 30.0–36.0)
MCV: 74.2 fL — ABNORMAL LOW (ref 80.0–100.0)
Platelets: 412 10*3/uL — ABNORMAL HIGH (ref 150–400)
RBC: 3.61 MIL/uL — ABNORMAL LOW (ref 3.87–5.11)
RDW: 18.1 % — ABNORMAL HIGH (ref 11.5–15.5)
WBC: 14.7 10*3/uL — ABNORMAL HIGH (ref 4.0–10.5)
nRBC: 0.2 % (ref 0.0–0.2)

## 2021-12-08 LAB — ECHOCARDIOGRAM COMPLETE
Height: 61 in
S' Lateral: 1.5 cm
Weight: 1619.06 oz

## 2021-12-08 MED ORDER — IOHEXOL 300 MG/ML  SOLN
100.0000 mL | Freq: Once | INTRAMUSCULAR | Status: AC | PRN
  Administered 2021-12-08: 100 mL via INTRAVENOUS

## 2021-12-08 MED ORDER — GADOBUTROL 1 MMOL/ML IV SOLN
5.0000 mL | Freq: Once | INTRAVENOUS | Status: AC | PRN
  Administered 2021-12-08: 5 mL via INTRAVENOUS

## 2021-12-08 MED ORDER — IOHEXOL 9 MG/ML PO SOLN
500.0000 mL | ORAL | Status: AC
  Administered 2021-12-08 (×2): 500 mL via ORAL

## 2021-12-08 NOTE — Care Management Important Message (Signed)
Important Message  Patient Details  Name: Deanna Jacobson MRN: [id number] Date of Birth: [birth year]   Medicare Important Message Given:  Yes     Juliann Pulse A Katrinka Herbison 12/08/2021, 11:01 AM

## 2021-12-08 NOTE — Progress Notes (Addendum)
PROGRESS NOTE    Deanna Jacobson  QMV:[id number] DOB: Jul 25, 1955 DOA: 12/03/2021 PCP: Donnie Coffin, MD    Brief Narrative:  66 year old with history of hypertension, previous smoker, recently treated for extended course of antibiotics for pneumonia had a routine CT scan of the chest by primary care physician's office and found to have extensive malignancy and sent to ER.  Patient with generalized symptoms of malaise, fatigue, cough and shortness of breath.  Admitted for symptomatic treatment, diagnosis.  Oncology consultation. Remains in the hospital.  Inpatient procedures planned.  7/11 status post bronchoscopy on 7/10.  Plan to begin radiation therapy.   Assessment & Plan:   Suspected lung cancer, postobstructive pneumonia, large mediastinal mass.  Locally invasive lung cancer unless proven otherwise. Radiology and clinical picture consistent with extensive disease on mediastinum, also endobronchial lesions. Seen by oncology, pulmonary and radiation oncology. Bronchoscopy on 7/10 with biopsy.  Radiation simulation planned. Presumptive antibiotic, procalcitonin more than 7.   Chest physiotherapy, respiratory therapy, symptomatic treatment cough medications Dexamethasone for symptomatic treatment. Cough medication regimen. Palliative care consultation for symptom management, currently on morphine and Xanax and helping. Patient remains in the hospital due to symptoms not managed at home. 2D echocardiogram with trivial pericardial effusion. 7/11 keep 02 >92%  CT ab/p ordered for staging F/u bx report We will obtain MRI brain to assess for mets   Chronic pain syndrome: Patient on Flexeril and gabapentin and Celebrex.  Continued.   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  Improving.       DVT prophylaxis: heparin injection 5,000 Units Start: 12/08/21 0600  SCDs   Code  Status: Full code Family Communication: Husband at bedside Disposition Plan: Status is: Inpatient Remains inpatient appropriate because: Inpatient procedures needed.  Work-up pending     Consultants:  Oncology Pulmonary Radiation oncology  Procedures:  None  Antimicrobials:  Rocephin, 7/7---   Subjective: Very anxious.  She is trying to understand stuff about the radiation therapy.  No shortness of breath.  No chest pain   Objective: Vitals:   12/07/21 2343 12/08/21 0500 12/08/21 0557 12/08/21 0726  BP: (!) 145/74  122/62   Pulse: 87  91   Resp:   20   Temp:   97.6 F (36.4 C)   TempSrc:      SpO2:   95% 96%  Weight:  49.7 kg    Height:        Intake/Output Summary (Last 24 hours) at 12/08/2021 0809 Last data filed at 12/07/2021 2220 Gross per 24 hour  Intake 353 ml  Output 5 ml  Net 348 ml   Filed Weights   12/07/21 0500 12/07/21 1212 12/08/21 0500  Weight: 49.4 kg 49.4 kg 49.7 kg    Examination:  Calm, NAD Increase exp time, decrease air exchange. Reg s1/s2 no gallop loud harsh 3/6 sem lsb Soft benign +bs No edema Aaoxox3  Mood and affect appropriate in current setting     Data Reviewed: I have personally reviewed following labs and imaging studies  CBC: Recent Labs  Lab 12/03/21 1531 12/08/21 0523  WBC 8.8 14.7*  HGB 9.3* 8.1*  HCT 29.5* 26.8*  MCV 70.9* 74.2*  PLT 472* 284*   Basic Metabolic Panel: Recent Labs  Lab 12/02/21 1321 12/03/21 1531 12/05/21 0559  NA  --  126* 131*  K  --  3.8 4.1  CL  --  92* 95*  CO2  --  25 27  GLUCOSE  --  114* 96  BUN  --  14 11  CREATININE 0.70 0.73 0.71  CALCIUM  --  8.8* 9.1   GFR: Estimated Creatinine Clearance (by C-G formula based on SCr of 0.71 mg/dL) Female: 51.5 mL/min Female: 63 mL/min Liver Function Tests: No results for input(s): "AST", "ALT", "ALKPHOS", "BILITOT", "PROT", "ALBUMIN" in the last 168 hours. No results for input(s): "LIPASE", "AMYLASE" in the last 168 hours. No  results for input(s): "AMMONIA" in the last 168 hours. Coagulation Profile: No results for input(s): "INR", "PROTIME" in the last 168 hours. Cardiac Enzymes: No results for input(s): "CKTOTAL", "CKMB", "CKMBINDEX", "TROPONINI" in the last 168 hours. BNP (last 3 results) No results for input(s): "PROBNP" in the last 8760 hours. HbA1C: No results for input(s): "HGBA1C" in the last 72 hours. CBG: No results for input(s): "GLUCAP" in the last 168 hours. Lipid Profile: No results for input(s): "CHOL", "HDL", "LDLCALC", "TRIG", "CHOLHDL", "LDLDIRECT" in the last 72 hours. Thyroid Function Tests: No results for input(s): "TSH", "T4TOTAL", "FREET4", "T3FREE", "THYROIDAB" in the last 72 hours. Anemia Panel: No results for input(s): "VITAMINB12", "FOLATE", "FERRITIN", "TIBC", "IRON", "RETICCTPCT" in the last 72 hours.  Sepsis Labs: Recent Labs  Lab 12/03/21 2005  PROCALCITON 7.10    Recent Results (from the past 240 hour(s))  SARS Coronavirus 2 by RT PCR (hospital order, performed in [facility] hospital lab) *cepheid single result test* Anterior Nasal Swab     Status: None   Collection Time: 12/03/21  8:05 PM   Specimen: Anterior Nasal Swab  Result Value Ref Range Status   SARS Coronavirus 2 by RT PCR NEGATIVE NEGATIVE Final    Comment: (NOTE) SARS-CoV-2 target nucleic acids are NOT DETECTED.  The SARS-CoV-2 RNA is generally detectable in upper and lower respiratory specimens during the acute phase of infection. The lowest concentration of SARS-CoV-2 viral copies this assay can detect is 250 copies / mL. A negative result does not preclude SARS-CoV-2 infection and should not be used as the sole basis for treatment or other patient management decisions.  A negative result may occur with improper specimen collection / handling, submission of specimen other than nasopharyngeal swab, presence of viral mutation(s) within the areas targeted by this assay, and inadequate number of viral  copies (<250 copies / mL). A negative result must be combined with clinical observations, patient history, and epidemiological information.  Fact Sheet for Patients:   https://www.patel.info/  Fact Sheet for Healthcare Providers: https://hall.com/  This test is not yet approved or  cleared by the [place] FDA and has been authorized for detection and/or diagnosis of SARS-CoV-2 by FDA under an Emergency Use Authorization (EUA).  This EUA will remain in effect (meaning this test can be used) for the duration of the COVID-19 declaration under Section 564(b)(1) of the Act, 21 U.S.C. section 360bbb-3(b)(1), unless the authorization is terminated or revoked sooner.  Performed at [facility], [address] [place], [place] [address]          Radiology Studies: Reviewed from radiology reports.      Scheduled Meds:  arformoterol  15 mcg Nebulization BID   budesonide (PULMICORT) nebulizer solution  0.25 mg Nebulization BID   dexamethasone  4 mg Intravenous Q12H   docusate sodium  100 mg Oral BID   escitalopram  10 mg Oral Daily   feeding supplement  237 mL Oral BID BM   gabapentin  800 mg Oral QID  heparin  5,000 Units Subcutaneous Q8H   lidocaine  1 patch Transdermal Q24H   multivitamin with minerals  1 tablet Oral Daily   nicotine  14 mg Transdermal Daily   revefenacin  175 mcg Nebulization Daily   sodium chloride flush  3 mL Intravenous Q12H   traZODone  100 mg Oral QHS   Continuous Infusions:  cefTRIAXone (ROCEPHIN)  IV Stopped (12/08/21 0258)     LOS: 5 days    Time spent: 35 minutes    Nolberto Hanlon, MD [facility] Pager [phone]

## 2021-12-08 NOTE — Care Plan (Signed)
Patient tolerated bronchoscopy without issue.  She is in good spirits today.  Final path pending.  Updated patient's son with regards to extent of tumor seen.  May need to consider hospice as best option.  Discussed with Dr. Kurtis Bushman.  Patient is going to have abdomen and pelvis CT.  Also recommended MRI brain to exclude metastatic disease.  This will help with prognostication.   Deanna Don, MD Advanced Bronchoscopy PCCM [facility]    *This note was dictated using voice recognition software/Dragon.  Despite best efforts to proofread, errors can occur which can change the meaning. Any transcriptional errors that result from this process are unintentional and may not be fully corrected at the time of dictation.

## 2021-12-08 NOTE — Anesthesia Postprocedure Evaluation (Signed)
Anesthesia Post Note  Patient: Genasis Zingale  Procedure(s) Performed: VIDEO BRONCHOSCOPY WITH ENDOBRONCHIAL ULTRASOUND  Patient location during evaluation: PACU Anesthesia Type: General Level of consciousness: awake and alert Pain management: pain level controlled Vital Signs Assessment: post-procedure vital signs reviewed and stable Respiratory status: spontaneous breathing, nonlabored ventilation, respiratory function stable and patient connected to nasal cannula oxygen Cardiovascular status: blood pressure returned to baseline and stable Postop Assessment: no apparent nausea or vomiting Anesthetic complications: no   No notable events documented.   Last Vitals:  Vitals:   12/08/21 0726 12/08/21 0912  BP:  138/77  Pulse:  92  Resp:  16  Temp:  36.6 C  SpO2: 96% 93%    Last Pain:  Vitals:   12/08/21 0900  TempSrc:   PainSc: 6                  Precious Haws Rheta Hemmelgarn

## 2021-12-08 NOTE — Progress Notes (Signed)
Deanna Jacobson   DOB:Jan 01, 1956   GG#:[id number]    Subjective: Patient continues to have shortness of breath/cough.  She is status post bronchoscopy yesterday.  Status post evaluation with radiation oncology-awaiting to start radiation tomorrow.   Objective:  Vitals:   12/08/21 0726 12/08/21 0912  BP:  138/77  Pulse:  92  Resp:  16  Temp:  97.8 F (36.6 C)  SpO2: 96% 93%     Intake/Output Summary (Last 24 hours) at 12/08/2021 1855 Last data filed at 12/07/2021 2220 Gross per 24 hour  Intake 3 ml  Output --  Net 3 ml    Physical Exam Vitals and nursing note reviewed.  HENT:     Head: Normocephalic and atraumatic.     Mouth/Throat:     Pharynx: Oropharynx is clear.  Eyes:     Extraocular Movements: Extraocular movements intact.     Pupils: Pupils are equal, round, and reactive to light.  Cardiovascular:     Rate and Rhythm: Normal rate and regular rhythm.  Pulmonary:     Comments: Decreased breath sounds bilaterally.  Abdominal:     Palpations: Abdomen is soft.  Musculoskeletal:        General: Normal range of motion.     Cervical back: Normal range of motion.  Skin:    General: Skin is warm.  Neurological:     General: No focal deficit present.     Mental Status: She is alert and oriented to person, place, and time.  Psychiatric:        Behavior: Behavior normal.        Judgment: Judgment normal.      Labs:  Lab Results  Component Value Date   WBC 14.7 (H) 12/08/2021   HGB 8.1 (L) 12/08/2021   HCT 26.8 (L) 12/08/2021   MCV 74.2 (L) 12/08/2021   PLT 412 (H) 12/08/2021   NEUTROABS 4.2 12/23/2016    Lab Results  Component Value Date   NA 131 (L) 12/05/2021   K 4.1 12/05/2021   CL 95 (L) 12/05/2021   CO2 27 12/05/2021    Studies:  DG Chest Port 1 View  Result Date: 12/07/2021 CLINICAL DATA:  Status post bronchoscopy, shortness of breath EXAM: PORTABLE CHEST 1 VIEW COMPARISON:  12/03/2021 FINDINGS: Bilateral interstitial thickening. Right upper  lobe airspace disease peripherally. Faint right upper lobe airspace opacity. Lingular and left lower lobe airspace disease. No pleural effusion or pneumothorax. Mediastinal lymphadenopathy. Stable cardiomegaly. No acute osseous abnormality. IMPRESSION: 1. Bilateral patchy airspace disease and interstitial thickening unchanged from the CT of the chest dated 12/02/2021. 2. Persistent mediastinal lymphadenopathy. 3. No pneumothorax. Electronically Signed   By: Kathreen Devoid M.D.   On: 12/07/2021 14:38    # # 66 y.o.  adult with multiple medical problems including smoking/COPD/ currently admitted to hospital for worsening shortness of breath/cough.  CT scan shows mediastinal mass/lymphadenopathy encasing the vascular structures; trachea/esophagus; invading left atrium; left upper lobe mass.   #CT scan findings: Highly concerning for malignancy-s/p bronchoscopy pending results today.  See discussion below.  Also order CT scan abdomen pelvis for staging; and also brain MRI.   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  Discussed with Dr. Donella Stade awaiting to start radiation on 7/11.  Discussed with patient that we might even need to start her on radiation prior to availability of cytology-given the acuity of the situation.   #Mild anemia hemoglobin -8-9; microcytic suspect anemia of chronic  disease   #Mild hyponatremia sodium 126-likely secondary to SIADH.  Monitor closely.  #Had a long discussion with the patient's son-Deanna Jacobson regarding our significant concerns for malignancy.  However, pathology/final staging is pending at this time.  Discussed the rationale for ordering MRI brain/CT scan abdomen pelvis.  Deanna Jacobson is concerned about patient's tolerance to chemotherapy.  However, he is open to his mother proceeding with radiation.   Deanna Sickle, MD 12/08/2021  6:55 PM

## 2021-12-09 ENCOUNTER — Inpatient Hospital Stay (HOSPITAL_COMMUNITY)
Admit: 2021-12-09 | Discharge: 2021-12-09 | Disposition: A | Discharge status: Home / Self Care | Payer: Medicare Other | Attending: Internal Medicine | Admitting: Internal Medicine

## 2021-12-09 ENCOUNTER — Other Ambulatory Visit: Payer: Self-pay | Admitting: Anatomic Pathology

## 2021-12-09 ENCOUNTER — Ambulatory Visit: Payer: Medicare Other

## 2021-12-09 ENCOUNTER — Other Ambulatory Visit: Payer: Self-pay

## 2021-12-09 DIAGNOSIS — R0602 Shortness of breath: Secondary | ICD-10-CM | POA: Diagnosis not present

## 2021-12-09 DIAGNOSIS — Z515 Encounter for palliative care: Secondary | ICD-10-CM | POA: Diagnosis not present

## 2021-12-09 LAB — ECHOCARDIOGRAM LIMITED
Height: 61 in
S' Lateral: 2.3 cm
Weight: 1629.64 oz

## 2021-12-09 LAB — RAD ONC ARIA SESSION SUMMARY
Course Elapsed Days: 0
Plan Fractions Treated to Date: 1
Plan Prescribed Dose Per Fraction: 2 Gy
Plan Total Fractions Prescribed: 20
Plan Total Prescribed Dose: 40 Gy
Reference Point Dosage Given to Date: 2 Gy
Reference Point Session Dosage Given: 2 Gy
Session Number: 1

## 2021-12-09 LAB — SURGICAL PATHOLOGY

## 2021-12-09 LAB — CYTOLOGY - NON PAP

## 2021-12-09 MED ORDER — AMLODIPINE BESYLATE 5 MG PO TABS
5.0000 mg | ORAL_TABLET | Freq: Every day | ORAL | Status: DC
  Administered 2021-12-09 – 2021-12-12 (×4): 5 mg via ORAL
  Filled 2021-12-09 (×4): qty 1

## 2021-12-09 MED ORDER — LORAZEPAM 0.5 MG PO TABS
0.5000 mg | ORAL_TABLET | Freq: Four times a day (QID) | ORAL | Status: DC | PRN
  Administered 2021-12-09 – 2021-12-15 (×10): 0.5 mg via ORAL
  Filled 2021-12-09 (×11): qty 1

## 2021-12-09 MED ORDER — MORPHINE SULFATE (CONCENTRATE) 10 MG/0.5ML PO SOLN
5.0000 mg | ORAL | Status: DC | PRN
  Administered 2021-12-09 – 2021-12-16 (×29): 10 mg via ORAL
  Filled 2021-12-09 (×30): qty 0.5

## 2021-12-09 NOTE — TOC Progression Note (Signed)
Transition of Care [facility]) - Progression Note    Patient Details  Name: Deanna Jacobson MRN: [id number] Date of Birth: July 16, 1955  Transition of Care [facility]) CM/SW [place], RN Phone Number: 12/09/2021, 1:18 PM  Clinical Narrative:   Care team and palliative currently working with patient to discuss options.  TOC will monitor for needs.    Expected Discharge Plan: Home/Self Care Barriers to Discharge: Continued Medical Work up  Expected Discharge Plan and Services Expected Discharge Plan: Home/Self Care In-house Referral: Chaplain Discharge Planning Services: CM Consult   Living arrangements for the past 2 months: Single Family Home                                       Social Determinants of Health (SDOH) Interventions    Readmission Risk Interventions    12/04/2021    3:23 PM  Readmission Risk Prevention Plan  Transportation Screening Complete  PCP or Specialist Appt within 5-7 Days Complete  Home Care Screening Complete  Medication Review (RN CM) Complete

## 2021-12-09 NOTE — Progress Notes (Signed)
PROGRESS NOTE    Deanna Jacobson  OIZ:[id number] DOB: 22-Mar-1956 DOA: 12/03/2021 PCP: Donnie Coffin, MD    Brief Narrative:  66 year old with history of hypertension, previous smoker, recently treated for extended course of antibiotics for pneumonia had a routine CT scan of the chest by primary care physician's office and found to have extensive malignancy and sent to ER.  Patient with generalized symptoms of malaise, fatigue, cough and shortness of breath.  Admitted for symptomatic treatment, diagnosis.  Oncology consultation. Remains in the hospital for symptom control. Bronchoscopy and biopsy, results pending MRI brain with multiple metastatic lesions.   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 Bronchoscopy on 7/10 with biopsy.  Inpatient radiation therapy planned. Presumptive antibiotic, procalcitonin more than 7.  We will continue until procedure.  Chest physiotherapy, respiratory therapy, symptomatic treatment cough medications and oxygen to keep saturations more than 90%. Dexamethasone for symptomatic treatment. Cough medication regimen. Palliative care consultation for symptom management, currently on morphine and Xanax and helping. Patient remains in the hospital due to symptoms not managed at home. 2D echocardiogram with trivial pericardial effusion. Patient with extensive anxiety issues now with metastatic cancer, less likely to tolerate chemotherapy.  Will need to revisit palliative care discussion and involved family members.  Chronic pain syndrome: Patient on Flexeril and gabapentin and Celebrex.  Continued.   Patient is using low-dose Xanax and liquid morphine that is helping her relieve anxiety and pain.    Essential hypertension: Blood pressures stable on amlodipine   Hyponatremia: Consistent with SIADH with lung  cancer.  Stable.     DVT prophylaxis: heparin injection 5,000 Units Start: 12/08/21 0600  SCDs   Code Status: Full code Family Communication: Husband at the bedside. Disposition Plan: Status is: Inpatient Remains inpatient appropriate because: Significant symptoms.     Consultants:  Oncology Pulmonary Radiation oncology Palliative care  Procedures:  Bronch and biopsy  Antimicrobials:  Rocephin, 7/7---   Subjective:  Patient seen and examined.  Tearful with MRI results.  Audibly wheezing.  Cries on every conversation.  Walking around in the hallway in the afternoon.  Objective: Vitals:   12/08/21 2156 12/09/21 0333 12/09/21 0539 12/09/21 0912  BP: (!) 149/74  (!) 144/87 (!) 143/75  Pulse: 87  86 93  Resp:   20   Temp:   98.1 F (36.7 C) 98.7 F (37.1 C)  TempSrc:      SpO2:   95% 94%  Weight:  46.2 kg    Height:       No intake or output data in the 24 hours ending 12/09/21 1309  Filed Weights   12/07/21 1212 12/08/21 0500 12/09/21 0333  Weight: 49.4 kg 49.7 kg 46.2 kg    Examination:  General: Frail and debilitated.  On room air.  Audible wheezing.  Crying and anxious. Cardiovascular: S1-S2 normal.  Regular rate rhythm. Respiratory: Bilateral audible expiratory wheezes, upper airway noise.  On room air.  Not in obvious respiratory distress. Gastrointestinal: Soft.  Nontender.  Bowel sounds present. Ext: No deformities.  No cyanosis.  No clubbing. Neuro: Intact. Musculoskeletal: No deformities. Skin: Intact.     Data Reviewed: I have personally reviewed following labs and imaging studies  CBC: Recent Labs  Lab 12/03/21 1531 12/08/21 0523  WBC 8.8 14.7*  HGB 9.3* 8.1*  HCT 29.5* 26.8*  MCV 70.9* 74.2*  PLT 472* 412*  Basic Metabolic Panel: Recent Labs  Lab 12/02/21 1321 12/03/21 1531 12/05/21 0559  NA  --  126* 131*  K  --  3.8 4.1  CL  --  92* 95*  CO2  --  25 27  GLUCOSE  --  114* 96  BUN  --  14 11  CREATININE 0.70 0.73  0.71  CALCIUM  --  8.8* 9.1   GFR: Estimated Creatinine Clearance (by C-G formula based on SCr of 0.71 mg/dL) Female: 49.8 mL/min Female: 58.6 mL/min Liver Function Tests: No results for input(s): "AST", "ALT", "ALKPHOS", "BILITOT", "PROT", "ALBUMIN" in the last 168 hours. No results for input(s): "LIPASE", "AMYLASE" in the last 168 hours. No results for input(s): "AMMONIA" in the last 168 hours. Coagulation Profile: No results for input(s): "INR", "PROTIME" in the last 168 hours. Cardiac Enzymes: No results for input(s): "CKTOTAL", "CKMB", "CKMBINDEX", "TROPONINI" in the last 168 hours. BNP (last 3 results) No results for input(s): "PROBNP" in the last 8760 hours. HbA1C: No results for input(s): "HGBA1C" in the last 72 hours. CBG: No results for input(s): "GLUCAP" in the last 168 hours. Lipid Profile: No results for input(s): "CHOL", "HDL", "LDLCALC", "TRIG", "CHOLHDL", "LDLDIRECT" in the last 72 hours. Thyroid Function Tests: No results for input(s): "TSH", "T4TOTAL", "FREET4", "T3FREE", "THYROIDAB" in the last 72 hours. Anemia Panel: No results for input(s): "VITAMINB12", "FOLATE", "FERRITIN", "TIBC", "IRON", "RETICCTPCT" in the last 72 hours.  Sepsis Labs: Recent Labs  Lab 12/03/21 2005  PROCALCITON 7.10    Recent Results (from the past 240 hour(s))  SARS Coronavirus 2 by RT PCR (hospital order, performed in [facility] hospital lab) *cepheid single result test* Anterior Nasal Swab     Status: None   Collection Time: 12/03/21  8:05 PM   Specimen: Anterior Nasal Swab  Result Value Ref Range Status   SARS Coronavirus 2 by RT PCR NEGATIVE NEGATIVE Final    Comment: (NOTE) SARS-CoV-2 target nucleic acids are NOT DETECTED.  The SARS-CoV-2 RNA is generally detectable in upper and lower respiratory specimens during the acute phase of infection. The lowest concentration of SARS-CoV-2 viral copies this assay can detect is 250 copies / mL. A negative result does not preclude  SARS-CoV-2 infection and should not be used as the sole basis for treatment or other patient management decisions.  A negative result may occur with improper specimen collection / handling, submission of specimen other than nasopharyngeal swab, presence of viral mutation(s) within the areas targeted by this assay, and inadequate number of viral copies (<250 copies / mL). A negative result must be combined with clinical observations, patient history, and epidemiological information.  Fact Sheet for Patients:   https://www.patel.info/  Fact Sheet for Healthcare Providers: https://hall.com/  This test is not yet approved or  cleared by the [place] FDA and has been authorized for detection and/or diagnosis of SARS-CoV-2 by FDA under an Emergency Use Authorization (EUA).  This EUA will remain in effect (meaning this test can be used) for the duration of the COVID-19 declaration under Section 564(b)(1) of the Act, 21 U.S.C. section 360bbb-3(b)(1), unless the authorization is terminated or revoked sooner.  Performed at [facility], [address], [place], [place] [address]          Radiology Studies: Reviewed from radiology reports.      Scheduled Meds:  amLODipine  5 mg Oral Daily   arformoterol  15 mcg Nebulization BID   budesonide (PULMICORT) nebulizer solution  0.25 mg Nebulization BID   dexamethasone  4 mg Intravenous Q12H   docusate sodium  100 mg Oral BID   escitalopram  10 mg Oral Daily   feeding supplement  237 mL Oral BID BM   gabapentin  800 mg Oral QID   heparin  5,000 Units Subcutaneous Q8H   lidocaine  1 patch Transdermal Q24H   multivitamin with minerals  1 tablet Oral Daily   nicotine  14 mg Transdermal Daily   revefenacin  175 mcg Nebulization Daily   sodium chloride flush  3 mL Intravenous Q12H   traZODone  100 mg Oral QHS   Continuous Infusions:  cefTRIAXone (ROCEPHIN)  IV Stopped  (12/09/21 0241)     LOS: 6 days    Time spent: 35 minutes    Barb Merino, MD [facility] Pager [phone]

## 2021-12-09 NOTE — Progress Notes (Signed)
[place] at [facility] ([place]) [phone] [phone]   Name: Deanna Jacobson Date: 12/09/2021 MRN: [id number]  DOB: 12/02/1955  Patient Care Team: Donnie Coffin, MD as PCP - General (Family Medicine)    REASON FOR CONSULTATION: Deanna Jacobson is a 66 y.o. adult with multiple medical problems including history of tobacco abuse, chronic back pain, and hypertension who was admitted to the hospital on 12/03/2021 with worsening shortness of breath.  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  Patient has had severe anxiety and intractable cough.  Palliative care was consulted to address goals and manage ongoing symptoms..    CODE STATUS: Full code  PAST MEDICAL HISTORY: Past Medical History:  Diagnosis Date   Chronic back pain    Hypertension     PAST SURGICAL HISTORY:  Past Surgical History:  Procedure Laterality Date   BREAST ENHANCEMENT SURGERY     BREAST SURGERY     COLONOSCOPY WITH PROPOFOL N/A 02/15/2019   Procedure: COLONOSCOPY WITH PROPOFOL;  Surgeon: [name], MD;  Location: [facility] ENDOSCOPY;  Service: Endoscopy;  Laterality: N/A;   ESOPHAGOGASTRODUODENOSCOPY (EGD) WITH PROPOFOL N/A 02/15/2019   Procedure: ESOPHAGOGASTRODUODENOSCOPY (EGD) WITH PROPOFOL;  Surgeon: [name], MD;  Location: [facility] ENDOSCOPY;  Service: Endoscopy;  Laterality: N/A;   VIDEO BRONCHOSCOPY WITH ENDOBRONCHIAL ULTRASOUND N/A 12/07/2021   Procedure: VIDEO BRONCHOSCOPY WITH ENDOBRONCHIAL ULTRASOUND;  Surgeon: Tyler Pita, MD;  Location: [facility] ORS;  Service: Pulmonary;  Laterality: N/A;    HEMATOLOGY/ONCOLOGY HISTORY:  Oncology History   No history exists.    ALLERGIES:  is allergic to codeine.  MEDICATIONS:  Current  Facility-Administered Medications  Medication Dose Route Frequency Provider Last Rate Last Admin   acetaminophen (TYLENOL) tablet 650 mg  650 mg Oral Q6H PRN Para Skeans, MD       Or   acetaminophen (TYLENOL) suppository 650 mg  650 mg Rectal Q6H PRN Para Skeans, MD       albuterol (PROVENTIL) (2.5 MG/3ML) 0.083% nebulizer solution 2.5 mg  2.5 mg Nebulization Q2H PRN Florina Ou V, MD   2.5 mg at 12/09/21 0554   amLODipine (NORVASC) tablet 5 mg  5 mg Oral Daily Barb Merino, MD   5 mg at 12/09/21 1449   arformoterol (BROVANA) nebulizer solution 15 mcg  15 mcg Nebulization BID Tyler Pita, MD   15 mcg at 12/09/21 6803   benzonatate (TESSALON) capsule 100 mg  100 mg Oral TID PRN Barb Merino, MD   100 mg at 12/09/21 1459   bisacodyl (DULCOLAX) EC tablet 5 mg  5 mg Oral Daily PRN Para Skeans, MD       budesonide (PULMICORT) nebulizer solution 0.25 mg  0.25 mg Nebulization BID Tyler Pita, MD   0.25 mg at 12/09/21 0829   cefTRIAXone (ROCEPHIN) 2 g in sodium chloride 0.9 % 100 mL IVPB  2 g Intravenous Q24H Tyler Pita, MD   Stopped at 12/09/21 0241   chlorpheniramine-HYDROcodone 10-8 MG/5ML suspension 5 mL  5 mL Oral Q12H PRN Tyler Pita, MD   5 mL at 12/07/21 2347   cyclobenzaprine (FLEXERIL) tablet 10 mg  10 mg Oral TID PRN Barb Merino, MD   10 mg at 12/06/21 2141   dexamethasone (DECADRON) injection 4 mg  4 mg  Intravenous Q12H Charlaine Dalton R, MD   4 mg at 12/09/21 1000   docusate sodium (COLACE) capsule 100 mg  100 mg Oral BID Para Skeans, MD   100 mg at 12/09/21 1000   escitalopram (LEXAPRO) tablet 10 mg  10 mg Oral Daily Lylian Sanagustin, Kirt Boys, NP   10 mg at 12/09/21 1000   feeding supplement (ENSURE ENLIVE / ENSURE PLUS) liquid 237 mL  237 mL Oral BID BM Barb Merino, MD   237 mL at 12/09/21 1000   gabapentin (NEURONTIN) capsule 800 mg  800 mg Oral QID Madueme, Elvira C, RPH   800 mg at 12/09/21 1449   guaiFENesin (MUCINEX) 12 hr tablet 600 mg  600  mg Oral BID PRN Para Skeans, MD       heparin injection 5,000 Units  5,000 Units Subcutaneous Q8H Tyler Pita, MD   5,000 Units at 12/09/21 1450   hydrALAZINE (APRESOLINE) injection 10 mg  10 mg Intravenous Q4H PRN Para Skeans, MD   10 mg at 12/08/21 1947   lidocaine (LIDODERM) 5 % 1 patch  1 patch Transdermal Q24H Florina Ou V, MD   1 patch at 12/03/21 2016   LORazepam (ATIVAN) tablet 0.5 mg  0.5 mg Oral Q6H PRN Zealand Boyett, Kirt Boys, NP       morphine CONCENTRATE 10 MG/0.5ML oral solution 5-10 mg  5-10 mg Oral Q2H PRN Darnelle Derrick, Kirt Boys, NP       multivitamin with minerals tablet 1 tablet  1 tablet Oral Daily Barb Merino, MD   1 tablet at 12/09/21 0959   nicotine (NICODERM CQ - dosed in mg/24 hours) patch 14 mg  14 mg Transdermal Daily Para Skeans, MD       ondansetron (ZOFRAN) tablet 4 mg  4 mg Oral Q6H PRN Para Skeans, MD       Or   ondansetron (ZOFRAN) injection 4 mg  4 mg Intravenous Q6H PRN Para Skeans, MD       polyethylene glycol (MIRALAX / GLYCOLAX) packet 17 g  17 g Oral Daily PRN Para Skeans, MD   17 g at 12/09/21 1449   revefenacin (YUPELRI) nebulizer solution 175 mcg  175 mcg Nebulization Daily Tyler Pita, MD   175 mcg at 12/09/21 0829   sodium chloride flush (NS) 0.9 % injection 3 mL  3 mL Intravenous Q12H Florina Ou V, MD   3 mL at 12/09/21 1000   traZODone (DESYREL) tablet 100 mg  100 mg Oral QHS Barb Merino, MD   100 mg at 12/08/21 2154    VITAL SIGNS: BP (!) 143/75 (BP Location: Right Arm)   Pulse 93   Temp 98.7 F (37.1 C)   Resp 20   Ht 5\' 1"  (1.549 m)   Wt 101 lb 13.6 oz (46.2 kg)   SpO2 94%   BMI 19.24 kg/m  Filed Weights   12/07/21 1212 12/08/21 0500 12/09/21 0333  Weight: 108 lb 14.5 oz (49.4 kg) 109 lb 9.1 oz (49.7 kg) 101 lb 13.6 oz (46.2 kg)    Estimated body mass index is 19.24 kg/m as calculated from the following:   Height as of this encounter: 5\' 1"  (1.549 m).   Weight as of this encounter: 101 lb 13.6 oz (46.2  kg).  LABS: CBC:    Component Value Date/Time   WBC 14.7 (H) 12/08/2021 0523   HGB 8.1 (L) 12/08/2021 0523   HGB 11.5 02/01/2019 1522   HCT 26.8 (  L) 12/08/2021 0523   HCT 34.4 02/01/2019 1522   PLT 412 (H) 12/08/2021 0523   PLT 274 02/01/2019 1522   MCV 74.2 (L) 12/08/2021 0523   MCV 89 02/01/2019 1522   NEUTROABS 4.2 12/23/2016 1615   LYMPHSABS 1.5 12/23/2016 1615   MONOABS 0.7 02/01/2016 1514   EOSABS 0.3 12/23/2016 1615   BASOSABS 0.0 12/23/2016 1615   Comprehensive Metabolic Panel:    Component Value Date/Time   NA 131 (L) 12/05/2021 0559   NA 136 06/13/2018 0945   K 4.1 12/05/2021 0559   CL 95 (L) 12/05/2021 0559   CO2 27 12/05/2021 0559   BUN 11 12/05/2021 0559   BUN 10 06/13/2018 0945   CREATININE 0.71 12/05/2021 0559   GLUCOSE 96 12/05/2021 0559   CALCIUM 9.1 12/05/2021 0559   AST 12 06/13/2018 0945   ALT 12 (L) 02/02/2017 0948   ALKPHOS 83 06/13/2018 0945   BILITOT <0.2 06/13/2018 0945   PROT 6.2 06/13/2018 0945   ALBUMIN 3.9 06/13/2018 0945    RADIOGRAPHIC STUDIES:  PERFORMANCE STATUS (ECOG) : 1 - Symptomatic but completely ambulatory  Review of Systems Unless otherwise noted, a complete review of systems is negative.  Physical Exam General: NAD Cardiovascular: regular rate and rhythm Pulmonary: coarse, exertionally labored Abdomen: soft, nontender, + bowel sounds GU: no suprapubic tenderness Extremities: no edema, no joint deformities Skin: no rashes Neurological: Weakness but otherwise nonfocal  IMPRESSION: Follow-up visit.  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  I spoke with patient and her significant other.  Patient clearly recognizes the significance of the work-up.  She was talking about how her son lost his father several years ago to cancer and now she to will also pass as a result of cancer.  We discussed important decisions such as future  treatment, advance care planning, and CODE STATUS.  Patient says that she is undecided and needs time to process and speak with family about decision-making.  Symptomatically, she continues to endorse significant dyspnea and anxiety.  She says that the alprazolam and morphine are helping but short-lived and that she is watching the clock for the next dose.  We will liberalize frequency of morphine and rotate from alprazolam to lorazepam.  PLAN: -Continue current scope of treatment -Patient family discussing decision making -Rotate from alprazolam to lorazepam -Increase frequency of morphine for pain/dyspnea -Will follow  Case and plan discussed with Dr. Rogue Bussing   Patient expressed understanding and was in agreement with this plan. She also understands that She can call the clinic at any time with any questions, concerns, or complaints.     Time Total: 35 minutes  Visit consisted of counseling and education dealing with the complex and emotionally intense issues of symptom management and palliative care in the setting of serious and potentially life-threatening illness.Greater than 50%  of this time was spent counseling and coordinating care related to the above assessment and plan.  Signed by: Altha Harm, PhD, NP-C

## 2021-12-09 NOTE — Progress Notes (Signed)
   12/09/21 1700  Clinical Encounter Type  Visited With Patient and family together  Visit Type Follow-up;Critical Care  Referral From Nurse  Consult/Referral To Chaplain   Chaplain responded to [facility] consult to help with AD. Patient is not ready at this time to complete as she needs to talk more with family. Patient is struggling with the difficult news of her diagnosis and Chaplain allowed her to talk through that. Patient has a difficult time breathing and talking but wanted to communicate this information. Family requested prayer.

## 2021-12-09 NOTE — Progress Notes (Signed)
Deanna Jacobson   DOB:09-18-1955   ER#:[id number]    Subjective: Patient continues to have shortness of breath/cough.  She is status post bronchoscopy 2 days ago.   Status post evaluation with radiation oncology-awaiting to start radiation  this afternoon.   Objective:  Vitals:   12/09/21 1619 12/09/21 1937  BP: (!) 155/98 (!) 174/86  Pulse: 95 91  Resp:    Temp: 98 F (36.7 C) 98.2 F (36.8 C)  SpO2: 95% 95%    No intake or output data in the 24 hours ending 12/09/21 2110   Physical Exam Vitals and nursing note reviewed.  HENT:     Head: Normocephalic and atraumatic.     Mouth/Throat:     Pharynx: Oropharynx is clear.  Eyes:     Extraocular Movements: Extraocular movements intact.     Pupils: Pupils are equal, round, and reactive to light.  Cardiovascular:     Rate and Rhythm: Normal rate and regular rhythm.  Pulmonary:     Comments: Decreased breath sounds bilaterally.  Abdominal:     Palpations: Abdomen is soft.  Musculoskeletal:        General: Normal range of motion.     Cervical back: Normal range of motion.  Skin:    General: Skin is warm.  Neurological:     General: No focal deficit present.     Mental Status: She is alert and oriented to person, place, and time.  Psychiatric:        Behavior: Behavior normal.        Judgment: Judgment normal.      Labs:  Lab Results  Component Value Date   WBC 14.7 (H) 12/08/2021   HGB 8.1 (L) 12/08/2021   HCT 26.8 (L) 12/08/2021   MCV 74.2 (L) 12/08/2021   PLT 412 (H) 12/08/2021   NEUTROABS 4.2 12/23/2016    Lab Results  Component Value Date   NA 131 (L) 12/05/2021   K 4.1 12/05/2021   CL 95 (L) 12/05/2021   CO2 27 12/05/2021    Studies:  ECHOCARDIOGRAM LIMITED  Result Date: 12/09/2021    ECHOCARDIOGRAM LIMITED REPORT   Patient Name:   Deanna Jacobson Date of Exam: 12/09/2021 Medical Rec #:  [id number]         Height:       61.0 in Accession #:    [id number]        Weight:       101.9 lb Date of Birth:   June 19, 1955        BSA:          1.418 m Patient Age:    51 years          BP:           144/87 mmHg Patient Gender: F                 HR:           86 bpm. Exam Location:  [facility] Procedure: 2D Echo, Cardiac Doppler and Color Doppler Indications:     Aortic valve stenosis I35.9  History:         Patient has prior history of Echocardiogram examinations, most                  recent 12/04/2021.  Sonographer:     Sherrie Sport Referring Phys:  [id number] [facility] AMERY Diagnosing Phys: Kate Sable MD  Sonographer Comments: Technically challenging study due to limited acoustic windows, no apical window and suboptimal  subcostal window. Image acquisition challenging due to COPD and Image acquisition challenging due to patient body habitus. IMPRESSIONS  1. Left ventricular ejection fraction, by estimation, is 60 to 65%. The left ventricle has normal function.  2. The mitral valve is normal in structure. No evidence of mitral valve regurgitation.  3. Aortic valve hemodynamics /gradient not obtained likely due to body habitus. Unable to fully assess aortic valve calcifications or stenosis.. The aortic valve is calcified. FINDINGS  Left Ventricle: Left ventricular ejection fraction, by estimation, is 60 to 65%. The left ventricle has normal function. Pericardium: Trivial pericardial effusion is present. Mitral Valve: The mitral valve is normal in structure. Aortic Valve: Aortic valve hemodynamics /gradient not obtained likely due to body habitus. Unable to fully assess aortic valve calcifications or stenosis. The aortic valve is calcified. Aorta: The aortic root is normal in size and structure. LEFT VENTRICLE PLAX 2D LVIDd:         3.50 cm LVIDs:         2.30 cm LV PW:         1.10 cm LV IVS:        1.30 cm  LEFT ATRIUM         Index LA diam:    3.80 cm 2.68 cm/m   AORTA Ao Root diam: 2.80 cm TRICUSPID VALVE TR Peak grad:   38.7 mmHg TR Vmax:        311.00 cm/s Kate Sable MD Electronically signed by Kate Sable MD  Signature Date/Time: 12/09/2021/2:48:30 PM    Final    MR BRAIN W WO CONTRAST  Result Date: 12/09/2021 CLINICAL DATA:  Initial evaluation for history of non-small cell lung cancer, evaluate for metastatic disease. EXAM: MRI HEAD WITHOUT AND WITH CONTRAST TECHNIQUE: Multiplanar, multiecho pulse sequences of the brain and surrounding structures were obtained without and with intravenous contrast. CONTRAST:  35mL GADAVIST GADOBUTROL 1 MMOL/ML IV SOLN COMPARISON:  Prior head CT from 11/27/2007. FINDINGS: Brain: Cerebral volume within normal limits. Patchy T2/FLAIR hyperintensity involving the periventricular and deep white matter both cerebral hemispheres most consistent with chronic small vessel ischemic disease, mild to moderate in nature. No evidence for acute or subacute infarct. No areas of chronic cortical infarction. No acute or chronic intracranial blood products. Multiple scattered cystic masses are seen involving both cerebral hemispheres as well as the cerebellum, consistent with metastatic disease. There are at least 8 lesions in total. For reference purposes, the largest lesion involving the left cerebral hemisphere is positioned at the left parieto-occipital region and measures 3.3 x 2.3 x 2.5 cm (series 18, image 101). The largest lesion within the right cerebral hemisphere involves the peripheral right frontal lobe in measures 1.5 x 1.6 x 1.3 cm (series 18, image 129). Largest cerebellar lesion seen within the peripheral right cerebellum and measures 1.3 x 2.0 x 1.7 cm (series 18, image 35). These lesions are predominantly cystic in nature with peripheral postcontrast enhancement. No significant surrounding vasogenic edema or regional mass effect. No midline shift. Basilar cisterns remain patent. Ventricles normal size without hydrocephalus. No extra-axial fluid collection. Pituitary gland and suprasellar region within normal limits. Vascular: Major intracranial vascular flow voids are maintained.  Skull and upper cervical spine: Craniocervical junction within normal limits. Bone marrow signal intensity normal. No focal marrow replacing lesion. No scalp soft tissue abnormality. Sinuses/Orbits: Globes orbital soft tissues within normal limits. Paranasal sinuses are largely clear. Small left mastoid effusion noted, of doubtful significance. Other: None. IMPRESSION: 1. Multiple scattered cystic masses involving  both cerebral hemispheres and cerebellum, consistent with intracranial metastatic disease. No significant surrounding vasogenic edema or regional mass effect. 2. No other acute intracranial abnormality. 3. Underlying mild-to-moderate chronic microvascular ischemic disease. Electronically Signed   By: Jeannine Boga M.D.   On: 12/09/2021 04:15   CT ABDOMEN PELVIS W CONTRAST  Result Date: 12/08/2021 CLINICAL DATA:  Small cell lung cancer, for staging EXAM: CT ABDOMEN AND PELVIS WITH CONTRAST TECHNIQUE: Multidetector CT imaging of the abdomen and pelvis was performed using the standard protocol following bolus administration of intravenous contrast. RADIATION DOSE REDUCTION: This exam was performed according to the departmental dose-optimization program which includes automated exposure control, adjustment of the mA and/or kV according to patient size and/or use of iterative reconstruction technique. CONTRAST:  11mL OMNIPAQUE IOHEXOL 300 MG/ML  SOLN COMPARISON:  Partial comparison to CT chest dated 12/02/2021 FINDINGS: Lower chest: Small left pleural effusion, progressive. Additional left lower lobe tumor is better described on recent CT. Hepatobiliary: Liver is within normal limits. No suspicious enhancing hepatic lesions. Layering tiny gallstones in the gallbladder fundus (series 2/image 29), without associated inflammatory changes. No intrahepatic or extrahepatic ductal dilatation. Pancreas: Within normal limits. Spleen: Within normal limits. Adrenals/Urinary Tract: Adrenal glands are within  normal limits. Kidneys are within normal limits.  No hydronephrosis. Bladder is within normal limits. Stomach/Bowel: Stomach is within normal limits. No evidence of bowel obstruction. Normal appendix (series 2/image 84). Moderate colonic stool burden, suggesting constipation. Vascular/Lymphatic: No evidence of abdominal aortic aneurysm. Atherosclerotic calcifications of the abdominal aorta and branch vessels. No suspicious abdominopelvic lymphadenopathy. Reproductive: Uterus is within normal limits. No adnexal masses. Other: No abdominopelvic ascites. Musculoskeletal: No focal osseous lesions. IMPRESSION: No findings suspicious for metastatic disease in the abdomen/pelvis. Small left pleural effusion, mildly progressive. Electronically Signed   By: Julian Hy M.D.   On: 12/08/2021 22:28    # # 66 y.o.  adult with multiple medical problems including smoking/COPD/ currently admitted to hospital for worsening shortness of breath/cough.  CT scan shows mediastinal mass/lymphadenopathy encasing the vascular structures; trachea/esophagus; invading left atrium; left upper lobe mass.   # CT scan findings: Highly concerning for malignancy-s/p bronchoscopy pending results today.  MRI brain shows multiple brain lesions.  CT scan abdomen pelvis no evidence of any distant disease.   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  Discussed with Dr. Donella Stade awaiting to start radiation on 7/12. Discussed with patient that we might even need to start her on radiation prior to availability of cytology-given the acuity of the situation.   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  Monitor closely.  #Had a long discussion with the patient's son-Jimmy; and significant other regarding our significant concern for malignancy  based on imaging.  Brain MRI unfortunately shows multiple brain lesions again concerning for metastatic disease.  Reviewed the disease stage IV disease; which is unfortunately incurable.  Discussed multiple options including-palliative radiation only; or palliative radiation followed by chemotherapy versus hospice.  Discussed that median survival for stage IV lung cancer-with treatment is anywhere between 6 to 12 months.  However without any treatment the life expectancy would be in order few months.  Patient's son is extremely concerned about side effects of chemotherapy.  Discussed that chemotherapy can be addressed down the line after her radiation therapy.  #Anxiety-situational/secondary to underlying diagnosis.  Discussed with [employer].  Await evaluation with palliative care   #  I reviewed the blood work- with the patient in detail; also reviewed the imaging independently [as summarized above]; and with the patient in detail.   # 40 minutes face-to-face with the patient discussing the above plan of care; more than 50% of time spent on prognosis/ natural history; counseling and coordination.  Addendum: Biopsy positive for small cell lung cancer.  Cammie Sickle, MD 12/09/2021  9:10 PM

## 2021-12-09 NOTE — Progress Notes (Signed)
*  PRELIMINARY RESULTS* Echocardiogram 2D Echocardiogram has been performed.  Sherrie Sport 12/09/2021, 7:49 AM

## 2021-12-10 ENCOUNTER — Other Ambulatory Visit: Payer: Self-pay

## 2021-12-10 ENCOUNTER — Ambulatory Visit: Payer: Medicare Other

## 2021-12-10 DIAGNOSIS — R0602 Shortness of breath: Secondary | ICD-10-CM | POA: Diagnosis not present

## 2021-12-10 LAB — SURGICAL PATHOLOGY

## 2021-12-10 LAB — RAD ONC ARIA SESSION SUMMARY
Course Elapsed Days: 1
Plan Fractions Treated to Date: 2
Plan Prescribed Dose Per Fraction: 2 Gy
Plan Total Fractions Prescribed: 20
Plan Total Prescribed Dose: 40 Gy
Reference Point Dosage Given to Date: 4 Gy
Reference Point Session Dosage Given: 2 Gy
Session Number: 2

## 2021-12-10 MED ORDER — NYSTATIN 100000 UNIT/ML MT SUSP
5.0000 mL | Freq: Four times a day (QID) | OROMUCOSAL | Status: DC
  Administered 2021-12-10 – 2021-12-16 (×21): 500000 [IU] via OROMUCOSAL
  Filled 2021-12-10 (×22): qty 5

## 2021-12-10 MED ORDER — FLUCONAZOLE 100 MG PO TABS
200.0000 mg | ORAL_TABLET | Freq: Once | ORAL | Status: AC
  Administered 2021-12-10: 200 mg via ORAL
  Filled 2021-12-10: qty 2

## 2021-12-10 MED ORDER — BOOST / RESOURCE BREEZE PO LIQD CUSTOM: 1.0000 | Freq: Two times a day (BID) | ORAL | Status: DC

## 2021-12-10 MED ORDER — BOOST / RESOURCE BREEZE PO LIQD CUSTOM
1.0000 | Freq: Three times a day (TID) | ORAL | Status: DC
  Administered 2021-12-10 – 2021-12-13 (×4): 1 via ORAL

## 2021-12-10 NOTE — TOC Progression Note (Signed)
Transition of Care [facility]) - Progression Note    Patient Details  Name: Deanna Jacobson MRN: [id number] Date of Birth: [birth year]  Transition of Care [facility]) CM/SW [place], RN Phone Number: 12/10/2021, 11:34 AM  Clinical Narrative:   Patient is discussing plan of care with family after seeing palliative yesterday. TOC to follow.    Expected Discharge Plan: Home/Self Care Barriers to Discharge: Continued Medical Work up  Expected Discharge Plan and Services Expected Discharge Plan: Home/Self Care In-house Referral: Chaplain Discharge Planning Services: CM Consult   Living arrangements for the past 2 months: Single Family Home                                       Social Determinants of Health (SDOH) Interventions    Readmission Risk Interventions    12/04/2021    3:23 PM  Readmission Risk Prevention Plan  Transportation Screening Complete  PCP or Specialist Appt within 5-7 Days Complete  Home Care Screening Complete  Medication Review (RN CM) Complete

## 2021-12-10 NOTE — Progress Notes (Signed)
Nutrition Follow-up  DOCUMENTATION CODES:   Non-severe (moderate) malnutrition in context of chronic illness  INTERVENTION:  Discontinue Ensure Enlive Boost Breeze po TID, each supplement provides 250 kcal and 9 grams of protein MVI with minerals daily Snacks BID to optimize nutritional intake Consider increasing bowel regimen d/t ongoing bowel movement Recommend assessment of thrush; spoke with MD  NUTRITION DIAGNOSIS:   Moderate Malnutrition related to chronic illness as evidenced by mild fat depletion, moderate fat depletion, severe muscle depletion, moderate muscle depletion, energy intake < 75% for > or equal to 1 month.  Ongoing  GOAL:   Patient will meet greater than or equal to 90% of their needs  Not met-addressing via meals, nutrition supplements and snacks  MONITOR:   PO intake, Supplement acceptance, Labs, Weight trends  REASON FOR ASSESSMENT:   Consult Other (Comment) (nutrition goals)  ASSESSMENT:   Pt admitted with worsening SOB for weeks d/t recent PNA. PMH significant for HTN  07/10: s/p bronchoscopy- findings of small cell lung cancer; MRI findings of multiple intracranial mets 07/12: initiation of palliative radiation  Palliative care following for GOC. Continue current scope of care. Ongoing discussions.   Pt reports ongoing poor intake during admission. She is taking small bites of foods but no full meals. Family and friends have been bringing lots of food that she enjoys but has not eaten a full meal. She continues to receive snacks twice daily and is enjoying the foods she is receiving. Unable to tolerate thick and milky substances at this time. Offered Boost Breeze for pt to try. She tolerated this supplement and is agreeable to receive these over Ensure at this time.  She mentioned having a sore tongue. It appears as though she is forming patches on tongue. Reached out to MD to assess for thrush. Pt started on nystatin.  Pt having difficulty with  constipation but endorses having a bowel movement last night. Constipation likely d/t pain medications and limited PO intake. Will continue to monitor. Can consider increasing bowel regimen if continues.  Reviewed wt documentation. Admit wt: 45.9 kg Current wt: 46.2 kg  Medications: decadron, colace, MVI, nystatin, dulcolax PRN, miralax PRN  Labs: reviewed   Diet Order:   Diet Order             Diet regular Room service appropriate? Yes; Fluid consistency: Thin  Diet effective now                   EDUCATION NEEDS:   Education needs have been addressed  Skin:  Skin Assessment: Reviewed RN Assessment  Last BM:  7/12 (per pt)  Height:   Ht Readings from Last 1 Encounters:  12/07/21 _0  (1.549 m)    Weight:   Wt Readings from Last 1 Encounters:  12/09/21 46.2 kg    Ideal Body Weight:  47.7 kg  BMI:  Body mass index is 19.24 kg/m.  Estimated Nutritional Needs:   Kcal:  1600-1800  Protein:  80-95g  Fluid:  >/=1.6L  Clayborne Dana, RDN, LDN Clinical Nutrition

## 2021-12-10 NOTE — Progress Notes (Signed)
PROGRESS NOTE    Deanna Jacobson  NAT:[id number] DOB: 03/31/1956 DOA: 12/03/2021 PCP: Donnie Coffin, MD    Brief Narrative:  66 year old with history of hypertension, previous smoker, recently treated with extended course of antibiotics for pneumonia had a routine CT scan of the chest by primary care physician's office and found to have extensive malignancy and sent to ER.  Patient with generalized symptoms of malaise, fatigue, cough and shortness of breath.  Admitted for symptomatic treatment, diagnosis.  Oncology consultation. Remains in the hospital for symptom control. Bronchoscopy and biopsy, consistent with a small cell lung cancer. MRI brain with multiple metastatic lesions. Patient remains extremely anxious, worsening respiratory symptoms including wheezing.  Undergoing inpatient radiation treatment.   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 Bronchoscopy 7/10, biopsy with small cell lung cancer. Underwent inpatient radiation therapy. Completed 7 days of antibiotics, no benefits of ongoing antibiotic therapy.  Discontinue Rocephin. Symptom management with the steroids, bronchodilators, anxiety relief with Ativan and increasing dose of morphine for shortness of breath and restlessness. 2D echocardiogram with trivial pericardial effusion. Patient would likely benefit with home hospice evaluation given extensive disease and likely will not tolerate chemotherapy side effects given extensive anxiety and now with respiratory functions. Palliative care following.  Chronic pain syndrome: Patient on Flexeril and gabapentin and Celebrex.  Continued.   Patient is using low-dose Xanax and morphine that is helping her relieve anxiety and pain.    Essential hypertension: Blood pressures stable on amlodipine.   Occasionally using hydralazine.  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  Stable.     DVT prophylaxis: heparin injection 5,000 Units Start: 12/08/21 0600  SCDs   Code Status: Full code Family Communication: Boyfriend at bedside Disposition Plan: Status is: Inpatient Remains inpatient appropriate because: Significant symptoms.     Consultants:  Oncology Pulmonary Radiation oncology Palliative care  Procedures:  Bronch and biopsy  Antimicrobials:  Rocephin, 7/7--- 7/13   Subjective:  Patient seen and examined.  She was in the bathroom and audibly wheezing from hallway.  Boyfriend at the bedside. Patient believes that she is dying soon.  She wants to involve her boyfriend and son while discussing important care decisions.  She is inclined towards going home with hospice care but not made up her mind yet.  Objective: Vitals:   12/09/21 1937 12/10/21 0416 12/10/21 0723 12/10/21 0731  BP: (!) 174/86 (!) 114/91 110/81   Pulse: 91 93 97 97  Resp:  19 20 (!) 22  Temp: 98.2 F (36.8 C) 99.9 F (37.7 C) 97.9 F (36.6 C)   TempSrc: Oral Oral    SpO2: 95% 94% 94% 94%  Weight:      Height:        Intake/Output Summary (Last 24 hours) at 12/10/2021 1331 Last data filed at 12/10/2021 1022 Gross per 24 hour  Intake 660 ml  Output --  Net 660 ml    Filed Weights   12/07/21 1212 12/08/21 0500 12/09/21 0333  Weight: 49.4 kg 49.7 kg 46.2 kg    Examination:  General: Frail and debilitated.  Anxious.  Occasionally tries to be cheerful but cries again. Cardiovascular: S1-S2 normal.  Regular rate rhythm. Respiratory: Bilateral upper airway conducted sounds.  Expiratory and expiratory wheezes. Gastrointestinal: Soft.  Nontender.  Bowel sound present. Ext: No deformities.  No cyanosis.  No edema. Neuro: Intact.  Musculoskeletal: No deformities. Skin: Intact. Psych: Anxious.     Data Reviewed: I have personally reviewed following labs and imaging  studies  CBC: Recent Labs  Lab 12/03/21 1531 12/08/21 0523  WBC 8.8 14.7*  HGB 9.3* 8.1*  HCT 29.5* 26.8*  MCV 70.9* 74.2*  PLT 472* 412*    Basic Metabolic Panel: Recent Labs  Lab 12/03/21 1531 12/05/21 0559  NA 126* 131*  K 3.8 4.1  CL 92* 95*  CO2 25 27  GLUCOSE 114* 96  BUN 14 11  CREATININE 0.73 0.71  CALCIUM 8.8* 9.1    GFR: Estimated Creatinine Clearance (by C-G formula based on SCr of 0.71 mg/dL) Female: 49.8 mL/min Female: 58.6 mL/min Liver Function Tests: No results for input(s): "AST", "ALT", "ALKPHOS", "BILITOT", "PROT", "ALBUMIN" in the last 168 hours. No results for input(s): "LIPASE", "AMYLASE" in the last 168 hours. No results for input(s): "AMMONIA" in the last 168 hours. Coagulation Profile: No results for input(s): "INR", "PROTIME" in the last 168 hours. Cardiac Enzymes: No results for input(s): "CKTOTAL", "CKMB", "CKMBINDEX", "TROPONINI" in the last 168 hours. BNP (last 3 results) No results for input(s): "PROBNP" in the last 8760 hours. HbA1C: No results for input(s): "HGBA1C" in the last 72 hours. CBG: No results for input(s): "GLUCAP" in the last 168 hours. Lipid Profile: No results for input(s): "CHOL", "HDL", "LDLCALC", "TRIG", "CHOLHDL", "LDLDIRECT" in the last 72 hours. Thyroid Function Tests: No results for input(s): "TSH", "T4TOTAL", "FREET4", "T3FREE", "THYROIDAB" in the last 72 hours. Anemia Panel: No results for input(s): "VITAMINB12", "FOLATE", "FERRITIN", "TIBC", "IRON", "RETICCTPCT" in the last 72 hours.  Sepsis Labs: Recent Labs  Lab 12/03/21 2005  PROCALCITON 7.10     Recent Results (from the past 240 hour(s))  SARS Coronavirus 2 by RT PCR (hospital order, performed in [facility] hospital lab) *cepheid single result test* Anterior Nasal Swab     Status: None   Collection Time: 12/03/21  8:05 PM   Specimen: Anterior Nasal Swab  Result Value Ref Range Status   SARS Coronavirus 2 by RT PCR NEGATIVE NEGATIVE Final     Comment: (NOTE) SARS-CoV-2 target nucleic acids are NOT DETECTED.  The SARS-CoV-2 RNA is generally detectable in upper and lower respiratory specimens during the acute phase of infection. The lowest concentration of SARS-CoV-2 viral copies this assay can detect is 250 copies / mL. A negative result does not preclude SARS-CoV-2 infection and should not be used as the sole basis for treatment or other patient management decisions.  A negative result may occur with improper specimen collection / handling, submission of specimen other than nasopharyngeal swab, presence of viral mutation(s) within the areas targeted by this assay, and inadequate number of viral copies (<250 copies / mL). A negative result must be combined with clinical observations, patient history, and epidemiological information.  Fact Sheet for Patients:   https://www.patel.info/  Fact Sheet for Healthcare Providers: https://hall.com/  This test is not yet approved or  cleared by the [place] FDA and has been authorized for detection and/or diagnosis of SARS-CoV-2 by FDA under an Emergency Use Authorization (EUA).  This EUA will remain in effect (meaning this test can be used) for the duration of the COVID-19 declaration under Section 564(b)(1) of the Act, 21 U.S.C. section 360bbb-3(b)(1), unless the authorization is terminated or revoked sooner.  Performed at [facility], [address], [place], [place] [address]          Radiology Studies: Reviewed from radiology reports.  Scheduled Meds:  amLODipine  5 mg Oral Daily   arformoterol  15 mcg Nebulization BID   budesonide (PULMICORT) nebulizer solution  0.25 mg Nebulization BID   dexamethasone  4 mg Intravenous Q12H   docusate sodium  100 mg Oral BID   escitalopram  10 mg Oral Daily   feeding supplement  237 mL Oral BID BM   gabapentin  800 mg Oral QID   heparin  5,000 Units Subcutaneous  Q8H   lidocaine  1 patch Transdermal Q24H   multivitamin with minerals  1 tablet Oral Daily   revefenacin  175 mcg Nebulization Daily   sodium chloride flush  3 mL Intravenous Q12H   traZODone  100 mg Oral QHS   Continuous Infusions:     LOS: 7 days    Time spent: 35 minutes    Barb Merino, MD [facility] Pager [phone]

## 2021-12-10 NOTE — Progress Notes (Signed)
Deanna Jacobson   DOB:05-Sep-1955   SE#:[id number]    Subjective: Patient continues to have shortness of breath/cough.  No hemoptysis.  She is status post bronchoscopy 3 days ago.   Accompanied by her son.  Objective:  Vitals:   12/10/21 1518 12/10/21 1938  BP: (!) 143/82 (!) 141/77  Pulse: (!) 105 97  Resp: 20 16  Temp: 98.8 F (37.1 C) 97.6 F (36.4 C)  SpO2: 93% 95%     Intake/Output Summary (Last 24 hours) at 12/10/2021 2007 Last data filed at 12/10/2021 1022 Gross per 24 hour  Intake 660 ml  Output --  Net 660 ml     Physical Exam Vitals and nursing note reviewed.  HENT:     Head: Normocephalic and atraumatic.     Mouth/Throat:     Pharynx: Oropharynx is clear.  Eyes:     Extraocular Movements: Extraocular movements intact.     Pupils: Pupils are equal, round, and reactive to light.  Cardiovascular:     Rate and Rhythm: Normal rate and regular rhythm.  Pulmonary:     Comments: Decreased breath sounds bilaterally.  Diffuse coarse breath sounds/wheezing. Abdominal:     Palpations: Abdomen is soft.  Musculoskeletal:        General: Normal range of motion.     Cervical back: Normal range of motion.  Skin:    General: Skin is warm.  Neurological:     General: No focal deficit present.     Mental Status: She is alert and oriented to person, place, and time.  Psychiatric:        Behavior: Behavior normal.        Judgment: Judgment normal.      Labs:  Lab Results  Component Value Date   WBC 14.7 (H) 12/08/2021   HGB 8.1 (L) 12/08/2021   HCT 26.8 (L) 12/08/2021   MCV 74.2 (L) 12/08/2021   PLT 412 (H) 12/08/2021   NEUTROABS 4.2 12/23/2016    Lab Results  Component Value Date   NA 131 (L) 12/05/2021   K 4.1 12/05/2021   CL 95 (L) 12/05/2021   CO2 27 12/05/2021    Studies:  ECHOCARDIOGRAM LIMITED  Result Date: 12/09/2021    ECHOCARDIOGRAM LIMITED REPORT   Patient Name:   Deanna Jacobson Date of Exam: 12/09/2021 Medical Rec #:  [id number]          Height:       61.0 in Accession #:    [id number]        Weight:       101.9 lb Date of Birth:  30-Jun-1955        BSA:          1.418 m Patient Age:    66 years          BP:           144/87 mmHg Patient Gender: F                 HR:           86 bpm. Exam Location:  [facility] Procedure: 2D Echo, Cardiac Doppler and Color Doppler Indications:     Aortic valve stenosis I35.9  History:         Patient has prior history of Echocardiogram examinations, most                  recent 12/04/2021.  Sonographer:     Sherrie Sport Referring Phys:  [id number] [facility] AMERY Diagnosing Phys:  Kate Sable MD  Sonographer Comments: Technically challenging study due to limited acoustic windows, no apical window and suboptimal subcostal window. Image acquisition challenging due to COPD and Image acquisition challenging due to patient body habitus. IMPRESSIONS  1. Left ventricular ejection fraction, by estimation, is 60 to 65%. The left ventricle has normal function.  2. The mitral valve is normal in structure. No evidence of mitral valve regurgitation.  3. Aortic valve hemodynamics /gradient not obtained likely due to body habitus. Unable to fully assess aortic valve calcifications or stenosis.. The aortic valve is calcified. FINDINGS  Left Ventricle: Left ventricular ejection fraction, by estimation, is 60 to 65%. The left ventricle has normal function. Pericardium: Trivial pericardial effusion is present. Mitral Valve: The mitral valve is normal in structure. Aortic Valve: Aortic valve hemodynamics /gradient not obtained likely due to body habitus. Unable to fully assess aortic valve calcifications or stenosis. The aortic valve is calcified. Aorta: The aortic root is normal in size and structure. LEFT VENTRICLE PLAX 2D LVIDd:         3.50 cm LVIDs:         2.30 cm LV PW:         1.10 cm LV IVS:        1.30 cm  LEFT ATRIUM         Index LA diam:    3.80 cm 2.68 cm/m   AORTA Ao Root diam: 2.80 cm TRICUSPID VALVE TR Peak grad:   38.7 mmHg TR  Vmax:        311.00 cm/s Kate Sable MD Electronically signed by Kate Sable MD Signature Date/Time: 12/09/2021/2:48:30 PM    Final    MR BRAIN W WO CONTRAST  Result Date: 12/09/2021 CLINICAL DATA:  Initial evaluation for history of non-small cell lung cancer, evaluate for metastatic disease. EXAM: MRI HEAD WITHOUT AND WITH CONTRAST TECHNIQUE: Multiplanar, multiecho pulse sequences of the brain and surrounding structures were obtained without and with intravenous contrast. CONTRAST:  66mL GADAVIST GADOBUTROL 1 MMOL/ML IV SOLN COMPARISON:  Prior head CT from 11/27/2007. FINDINGS: Brain: Cerebral volume within normal limits. Patchy T2/FLAIR hyperintensity involving the periventricular and deep white matter both cerebral hemispheres most consistent with chronic small vessel ischemic disease, mild to moderate in nature. No evidence for acute or subacute infarct. No areas of chronic cortical infarction. No acute or chronic intracranial blood products. Multiple scattered cystic masses are seen involving both cerebral hemispheres as well as the cerebellum, consistent with metastatic disease. There are at least 8 lesions in total. For reference purposes, the largest lesion involving the left cerebral hemisphere is positioned at the left parieto-occipital region and measures 3.3 x 2.3 x 2.5 cm (series 18, image 101). The largest lesion within the right cerebral hemisphere involves the peripheral right frontal lobe in measures 1.5 x 1.6 x 1.3 cm (series 18, image 129). Largest cerebellar lesion seen within the peripheral right cerebellum and measures 1.3 x 2.0 x 1.7 cm (series 18, image 35). These lesions are predominantly cystic in nature with peripheral postcontrast enhancement. No significant surrounding vasogenic edema or regional mass effect. No midline shift. Basilar cisterns remain patent. Ventricles normal size without hydrocephalus. No extra-axial fluid collection. Pituitary gland and suprasellar region  within normal limits. Vascular: Major intracranial vascular flow voids are maintained. Skull and upper cervical spine: Craniocervical junction within normal limits. Bone marrow signal intensity normal. No focal marrow replacing lesion. No scalp soft tissue abnormality. Sinuses/Orbits: Globes orbital soft tissues within normal limits. Paranasal sinuses are  largely clear. Small left mastoid effusion noted, of doubtful significance. Other: None. IMPRESSION: 1. Multiple scattered cystic masses involving both cerebral hemispheres and cerebellum, consistent with intracranial metastatic disease. No significant surrounding vasogenic edema or regional mass effect. 2. No other acute intracranial abnormality. 3. Underlying mild-to-moderate chronic microvascular ischemic disease. Electronically Signed   By: Jeannine Boga M.D.   On: 12/09/2021 04:15    # # 66 y.o.  adult with multiple medical problems including smoking/COPD/ currently admitted to hospital for worsening shortness of breath/cough.  CT scan shows mediastinal mass/lymphadenopathy encasing the vascular structures; trachea/esophagus; invading left atrium; left upper lobe mass.   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  Monitor closely.  # # Anxiety-situational/secondary to underlying diagnosis.  S/p evaluation with Josh Borders.   PLAN:   #I had a long discussion with the patient's son-Jimmy-the pathology/imaging in detail.   Discussed that median survival for stage IV lung cancer-with treatment is anywhere between 6 to 12 months.  However given patient's frail status and bulky adenopathy; brain metastases-survival in the order of 3 months also.   #Patient's son is  concerned about the side effects of chemo and radiation.  However given limited options he is in agreement to proceeding with radiation as planned for palliative reasons.  He is not interested in any chemotherapeutic option.  We will follow closely.  #Disposition: With regards to discharge, patient/son extremely concerned about going home given her ongoing cough/generalized weakness.  Discussed with Dr.Ghmire and [place].    Cammie Sickle, MD 12/10/2021  8:07 PM

## 2021-12-11 ENCOUNTER — Other Ambulatory Visit: Payer: Self-pay

## 2021-12-11 ENCOUNTER — Ambulatory Visit: Payer: Medicare Other

## 2021-12-11 DIAGNOSIS — R0602 Shortness of breath: Secondary | ICD-10-CM | POA: Diagnosis not present

## 2021-12-11 LAB — RAD ONC ARIA SESSION SUMMARY
Course Elapsed Days: 2
Plan Fractions Treated to Date: 3
Plan Prescribed Dose Per Fraction: 2 Gy
Plan Total Fractions Prescribed: 20
Plan Total Prescribed Dose: 40 Gy
Reference Point Dosage Given to Date: 6 Gy
Reference Point Session Dosage Given: 2 Gy
Session Number: 3

## 2021-12-11 MED ORDER — METHYLPREDNISOLONE SODIUM SUCC 125 MG IJ SOLR
80.0000 mg | INTRAMUSCULAR | Status: DC
  Administered 2021-12-11 – 2021-12-15 (×5): 80 mg via INTRAVENOUS
  Filled 2021-12-11 (×5): qty 2

## 2021-12-11 NOTE — Care Management Important Message (Signed)
Important Message  Patient Details  Name: Deanna Jacobson MRN: [id number] Date of Birth: [birth year]   Medicare Important Message Given:  Yes     Juliann Pulse A Melodie Ashworth 12/11/2021, 2:18 PM

## 2021-12-11 NOTE — Progress Notes (Signed)
   12/11/21 0900  Clinical Encounter Type  Visited With Patient not available;Family  Visit Type Follow-up  Referral From Family  Consult/Referral To Chaplain   Chaplain responded to call to provide information for AD and DNR.

## 2021-12-11 NOTE — Progress Notes (Signed)
Deanna Jacobson   DOB:06/10/1955   WN#:[id number]    Subjective: Patient continues to have shortness of breath/cough.  Complains of choking with food/liquids.  No hemoptysis.  Currently undergoing palliative radiation for superior vena cava syndrome/significant mediastinal adenopathy.   Accompanied by her son.  Objective:  Vitals:   12/11/21 0506 12/11/21 0736  BP: 121/76 139/81  Pulse: 80 85  Resp: 18 17  Temp: 97.7 F (36.5 C) (!) 97.4 F (36.3 C)  SpO2: 92% 95%     Intake/Output Summary (Last 24 hours) at 12/11/2021 0931 Last data filed at 12/10/2021 2200 Gross per 24 hour  Intake 480 ml  Output --  Net 480 ml     Physical Exam Vitals and nursing note reviewed.  HENT:     Head: Normocephalic and atraumatic.     Mouth/Throat:     Pharynx: Oropharynx is clear.  Eyes:     Extraocular Movements: Extraocular movements intact.     Pupils: Pupils are equal, round, and reactive to light.  Cardiovascular:     Rate and Rhythm: Normal rate and regular rhythm.  Pulmonary:     Comments: Decreased breath sounds bilaterally.  Diffuse coarse breath sounds/wheezing. Abdominal:     Palpations: Abdomen is soft.  Musculoskeletal:        General: Normal range of motion.     Cervical back: Normal range of motion.  Skin:    General: Skin is warm.  Neurological:     General: No focal deficit present.     Mental Status: She is alert and oriented to person, place, and time.  Psychiatric:        Behavior: Behavior normal.        Judgment: Judgment normal.      Labs:  Lab Results  Component Value Date   WBC 14.7 (H) 12/08/2021   HGB 8.1 (L) 12/08/2021   HCT 26.8 (L) 12/08/2021   MCV 74.2 (L) 12/08/2021   PLT 412 (H) 12/08/2021   NEUTROABS 4.2 12/23/2016    Lab Results  Component Value Date   NA 131 (L) 12/05/2021   K 4.1 12/05/2021   CL 95 (L) 12/05/2021   CO2 27 12/05/2021    Studies:  No results found.  # # 66 y.o.  adult with multiple medical problems including  smoking/COPD/ currently admitted to hospital for worsening shortness of breath/cough.  CT scan shows mediastinal mass/lymphadenopathy encasing the vascular structures; trachea/esophagus; invading left atrium; left upper lobe mass.   #Small cell lung cancer-/Acute superior vena cava syndrome- stage IV with brain metastasis:  MRI brain shows multiple brain lesions-currently undergoing palliative radiation for mediastinal adenopathy fraction #3 today.  No significant improvement noted so far.  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  Monitor closely.  # # Anxiety-situational/secondary to underlying diagnosis.  S/p evaluation with Deanna Jacobson.   #Choking symptoms: ?  Likely secondary to malignancy.  Await speech path evaluation.  #CODE STATUS: Discussed with the patient and son in detail.  Given incurable nature of the disease and patient's poor performance status; and ongoing symptoms they realize that resuscitation would be futile.  Resuscitation would cause more harm than benefit.  Patient and son-in agreement with no CODE STATUS.  Updated in the chart.  Discussed with Dr.Ghimre. Please feel free to contact on-call oncologist for any urgent needs over the weekend.   Deanna Sickle, MD 12/11/2021  9:31 AM

## 2021-12-11 NOTE — Progress Notes (Signed)
PROGRESS NOTE    Deanna Jacobson  WJX:[id number] DOB: [birth year] DOA: 12/03/2021 PCP: Donnie Coffin, MD    Brief Narrative:  66 year old with history of hypertension, previous smoker, recently treated with extended course of antibiotics for pneumonia had a routine CT scan of the chest by primary care physician's office and found to have extensive malignancy and sent to ER.  Patient with generalized symptoms of malaise, fatigue, cough and shortness of breath.  Admitted for symptomatic treatment, diagnosis.  Oncology consultation. Remains in the hospital for symptom control. Bronchoscopy and biopsy, consistent with small cell lung cancer. MRI brain with multiple metastatic lesions. Patient remains extremely anxious, worsening respiratory symptoms including wheezing.  Undergoing inpatient radiation treatment. Remains in the hospital to control symptoms, plans unknown, however clinically home with hospice looks appropriate.  Followed by palliative and oncology.   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 Bronchoscopy 7/10, biopsy with small cell lung cancer. Receiving inpatient radiation. Completed 7 days of antibiotics, no benefits of ongoing antibiotic therapy.  Discontinue Rocephin. Symptom management with the steroids, bronchodilators, anxiety relief with Ativan and increasing dose of morphine for shortness of breath and restlessness. 2D echocardiogram with trivial pericardial effusion. Patient would likely benefit with home hospice evaluation given extensive disease and likely will not tolerate chemotherapy side effects given extensive anxiety and now with respiratory functions. Given rapid progression of disease, she is likely end-of-life. Oncology and palliative team discussing with family.  Chronic pain  syndrome: Patient on Flexeril and gabapentin and Celebrex.  Continued.   Patient is using low-dose Xanax and morphine that is helping her relieve anxiety and pain.    Essential hypertension: Blood pressures stable on amlodipine.  Occasionally using hydralazine.  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  Stable.     DVT prophylaxis: heparin injection 5,000 Units Start: 12/08/21 0600  SCDs   Code Status: DNR Family Communication: Son in the hallway Disposition Plan: Status is: Inpatient Remains inpatient appropriate because: Still with significant symptoms, now with difficulty eating. Palliative and hospice discussion ongoing.     Consultants:  Oncology Pulmonary Radiation oncology Palliative care  Procedures:  Bronch and biopsy  Antimicrobials:  Rocephin, 7/7--- 7/13   Subjective:  Patient seen and examined.  Anxious.  Difficulty eating regular food, frequent cough and audible wheezing.  Objective: Vitals:   12/10/21 2155 12/11/21 0256 12/11/21 0506 12/11/21 0736  BP: (!) 153/84  121/76 139/81  Pulse: 89  80 85  Resp: 18  18 17   Temp: (!) 97.5 F (36.4 C)  97.7 F (36.5 C) (!) 97.4 F (36.3 C)  TempSrc:      SpO2: 98%  92% 95%  Weight:  47 kg    Height:        Intake/Output Summary (Last 24 hours) at 12/11/2021 1108 Last data filed at 12/10/2021 2200 Gross per 24 hour  Intake 120 ml  Output --  Net 120 ml     Filed Weights   12/08/21 0500 12/09/21 0333 12/11/21 0256  Weight: 49.7 kg 46.2 kg 47 kg    Examination:  Audibly wheezing and airway noise.  On room air.  Anxious. Abdomen soft nontender. Extremities without swelling, edema or cyanosis.    Data Reviewed: I have personally reviewed following labs and imaging studies  CBC: Recent Labs  Lab 12/08/21 0523  WBC 14.7*  HGB 8.1*  HCT 26.8*  MCV 74.2*  PLT 412*    Basic Metabolic Panel: Recent Labs  Lab 12/05/21 0559  NA 131*  K 4.1  CL 95*  CO2 27  GLUCOSE 96   BUN 11  CREATININE 0.71  CALCIUM 9.1    GFR: Estimated Creatinine Clearance (by C-G formula based on SCr of 0.71 mg/dL) Female: 50.6 mL/min Female: 59.6 mL/min Liver Function Tests: No results for input(s): "AST", "ALT", "ALKPHOS", "BILITOT", "PROT", "ALBUMIN" in the last 168 hours. No results for input(s): "LIPASE", "AMYLASE" in the last 168 hours. No results for input(s): "AMMONIA" in the last 168 hours. Coagulation Profile: No results for input(s): "INR", "PROTIME" in the last 168 hours. Cardiac Enzymes: No results for input(s): "CKTOTAL", "CKMB", "CKMBINDEX", "TROPONINI" in the last 168 hours. BNP (last 3 results) No results for input(s): "PROBNP" in the last 8760 hours. HbA1C: No results for input(s): "HGBA1C" in the last 72 hours. CBG: No results for input(s): "GLUCAP" in the last 168 hours. Lipid Profile: No results for input(s): "CHOL", "HDL", "LDLCALC", "TRIG", "CHOLHDL", "LDLDIRECT" in the last 72 hours. Thyroid Function Tests: No results for input(s): "TSH", "T4TOTAL", "FREET4", "T3FREE", "THYROIDAB" in the last 72 hours. Anemia Panel: No results for input(s): "VITAMINB12", "FOLATE", "FERRITIN", "TIBC", "IRON", "RETICCTPCT" in the last 72 hours.  Sepsis Labs: No results for input(s): "PROCALCITON", "LATICACIDVEN" in the last 168 hours.   Recent Results (from the past 240 hour(s))  SARS Coronavirus 2 by RT PCR (hospital order, performed in [facility] hospital lab) *cepheid single result test* Anterior Nasal Swab     Status: None   Collection Time: 12/03/21  8:05 PM   Specimen: Anterior Nasal Swab  Result Value Ref Range Status   SARS Coronavirus 2 by RT PCR NEGATIVE NEGATIVE Final    Comment: (NOTE) SARS-CoV-2 target nucleic acids are NOT DETECTED.  The SARS-CoV-2 RNA is generally detectable in upper and lower respiratory specimens during the acute phase of infection. The lowest concentration of SARS-CoV-2 viral copies this assay can detect is 250 copies / mL.  A negative result does not preclude SARS-CoV-2 infection and should not be used as the sole basis for treatment or other patient management decisions.  A negative result may occur with improper specimen collection / handling, submission of specimen other than nasopharyngeal swab, presence of viral mutation(s) within the areas targeted by this assay, and inadequate number of viral copies (<250 copies / mL). A negative result must be combined with clinical observations, patient history, and epidemiological information.  Fact Sheet for Patients:   https://www.patel.info/  Fact Sheet for Healthcare Providers: https://hall.com/  This test is not yet approved or  cleared by the [place] FDA and has been authorized for detection and/or diagnosis of SARS-CoV-2 by FDA under an Emergency Use Authorization (EUA).  This EUA will remain in effect (meaning this test can be used) for the duration of the COVID-19 declaration under Section 564(b)(1) of the Act, 21 U.S.C. section 360bbb-3(b)(1), unless the authorization is terminated or revoked sooner.  Performed at [facility], [address] [place], [place] [address]          Radiology Studies: Reviewed from radiology reports.      Scheduled Meds:  amLODipine  5 mg Oral Daily   arformoterol  15 mcg Nebulization BID   budesonide (PULMICORT) nebulizer solution  0.25 mg Nebulization BID   docusate sodium  100 mg Oral BID   escitalopram  10 mg Oral Daily   feeding supplement  1 Container  Oral TID BM   gabapentin  800 mg Oral QID   heparin  5,000 Units Subcutaneous Q8H   lidocaine  1 patch Transdermal Q24H   methylPREDNISolone (SOLU-MEDROL) injection  80 mg Intravenous Q24H   multivitamin with minerals  1 tablet Oral Daily   nystatin  5 mL Mouth/Throat QID   revefenacin  175 mcg Nebulization Daily   sodium chloride flush  3 mL Intravenous Q12H   traZODone  100 mg Oral QHS    Continuous Infusions:     LOS: 8 days    Time spent: 35 minutes    Barb Merino, MD [facility] Pager [phone]

## 2021-12-11 NOTE — Evaluation (Signed)
Clinical/Bedside Swallow Evaluation Patient Details  Name: Deanna Jacobson MRN: [id number] Date of Birth: Jan 09, 1956  Today's Date: 12/11/2021 Time: SLP Start Time (ACUTE ONLY): 40 SLP Stop Time (ACUTE ONLY): 1400 SLP Time Calculation (min) (ACUTE ONLY): 90 min  Past Medical History:  Past Medical History:  Diagnosis Date   Chronic back pain    Hypertension    Past Surgical History:  Past Surgical History:  Procedure Laterality Date   BREAST ENHANCEMENT SURGERY     BREAST SURGERY     COLONOSCOPY WITH PROPOFOL N/A 02/15/2019   Procedure: COLONOSCOPY WITH PROPOFOL;  Surgeon: [name], MD;  Location: [facility] ENDOSCOPY;  Service: Endoscopy;  Laterality: N/A;   ESOPHAGOGASTRODUODENOSCOPY (EGD) WITH PROPOFOL N/A 02/15/2019   Procedure: ESOPHAGOGASTRODUODENOSCOPY (EGD) WITH PROPOFOL;  Surgeon: [name], MD;  Location: [facility] ENDOSCOPY;  Service: Endoscopy;  Laterality: N/A;   VIDEO BRONCHOSCOPY WITH ENDOBRONCHIAL ULTRASOUND N/A 12/07/2021   Procedure: VIDEO BRONCHOSCOPY WITH ENDOBRONCHIAL ULTRASOUND;  Surgeon: Tyler Pita, MD;  Location: [facility] ORS;  Service: Pulmonary;  Laterality: N/A;   HPI:  Pt is a 66 y.o. 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 She has history of chronic tobacco abuse, chronic back pain, and hypertension who was admitted to the hospital on 12/03/2021 with worsening shortness of breath.  She is Malnourished.  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  Patient has had severe anxiety and intractable cough.  Palliative care is involved.  Pt is receiving Morphine scheduled.  Weight is 47 kg.    Assessment / Plan / Recommendation  Clinical Impression   Pt was  admitted to hospital for worsening shortness of breath/cough. CT scan shows mediastinal mass/lymphadenopathy encasing the vascular structures; trachea/esophagus; invading left atrium; left upper lobe mass. She exhibits Mod shortness of breath/cough even w/ exertion of talking and c/o a choking feeling with food/liquids. She continues to eat a Regular diet per her preference. MRI brain shows multiple brain lesions-currently undergoing palliative radiation for significant mediastinal adenopathy, fraction #3 today. No significant improvement noted so far. She presents w/ suspected pharyngoesophageal phase dysphagia and risk for aspiration secondary to apparent, significant impact from her current Pulmonary status and mediastinal mass/lymphadenopathy encasing the vascular structures including the trachea/esophagus. She is SOB w/ WOB w/ any exertion including talking. Suspect the timing of the Apnea moment during exertion of swallowing and breathing is incomplete.  ANY such significant Pulmonary decline can impact Apnea timing and airway closure during the swallow which can impact pharyngeal swallowing, airway protection, and increase risk for aspiration to occur thus further Pulmonary impact/decline.    During this evaluation, trials of thin liquids, purees, and soft solid revealed no consistent, overt clinical s/s of aspiration -- however, increased coughing did occur when pt took MULTIPLE sips of thin liquids. When she slowed and monitored her drinking to ONE SIP AT TIME, no consistent coughing followed. Pt was also instructed to NOT TALK during eating/drinking to LESSEN DEMAND on her Pulmonary system. REST BREAKS were implemented as well for conservation of energy. Pt's respiratory effort appeared increased w/ the effort from the exertion of the tasks but calmed again (slightly, and to her baseline) w/ the REST BREAKS b/t trials. Swallowing of liquids was timely. Oral phase appeared grossly [facility] for trial  consistencies  given -- less effort w/ the Pureed foods (she accepted few). OM exam revealed no unilateral weakness. Pt is being treated for [facility] which can impact the oropharyngeal swallowing d/t its discomfort also.  MUCH DISCUSSION AND EDUCATION was had on pt's diet consistency and preference for her diet consistency -- pt stated preference for staying on her Regular diet and "don't take my drinks away from me".  Recommend a more broken-down foods diet w/ added Purees d/t decrease work-load, for conservation of energy. Pt has preferences for Solids foods in her diet -- foods that people bring her from home. Encouraged her to break down foods and Moistened foods well for ease of mastication. STRICT aspiration precautions w/ frequent REST BREAKS during oral intake w/ close monitoring of RR and increased mouth breathing. LESS/NO TALKING DURING MEALS. REFLUX precautions.   ST services can be available for further education if needed while admitted. This above was discussed w/ pt/family/friends present/NSG/MD/Palliative Care NP.  SLP Visit Diagnosis: Dysphagia, pharyngoesophageal phase (R13.14) (w/ Pulmonary impact)    Aspiration Risk  Mild aspiration risk;Moderate aspiration risk;Risk for inadequate nutrition/hydration    Diet Recommendation   more broken-down foods diet w/ added Purees d/t decrease work-load, for conservation of energy. Pt has preferences for Solids foods in her diet -- foods that people bring her from home. Encouraged her to break down foods and Moistened foods well for ease of mastication. STRICT aspiration precautions w/ frequent REST BREAKS during oral intake w/ close monitoring of RR and increased mouth breathing. LESS/NO TALKING DURING MEALS.  Medication Administration: Whole meds with puree (vs Crushed (as needed for safer, easier swallowing))    Other  Recommendations Recommended Consults:  (Palliative Care following for [place]) Oral Care Recommendations: Oral care BID;Oral  care before and after PO;Patient independent with oral care (Denture care support) Other Recommendations:  (n/a)    Recommendations for follow up therapy are one component of a multi-disciplinary discharge planning process, led by the attending physician.  Recommendations may be updated based on patient status, additional functional criteria and insurance authorization.  Follow up Recommendations No SLP follow up      Assistance Recommended at Discharge Set up Supervision/Assistance  Functional Status Assessment Patient has had a recent decline in their functional status and/or demonstrates limited ability to make significant improvements in function in a reasonable and predictable amount of time  Frequency and Duration  (n/a)   (n/a)       Prognosis Prognosis for Safe Diet Advancement: Guarded Barriers to Reach Goals: Time post onset;Severity of deficits;Behavior Barriers/Prognosis Comment: pt requested to continue a regular diet w/ thin liquids for her GOC      Swallow Study   General Date of Onset: 12/03/21 HPI: Pt is a 66 y.o. 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 She has history of chronic tobacco abuse, chronic back pain, and hypertension who was admitted to the hospital on 12/03/2021 with worsening shortness of breath.  She is Malnourished.  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  Patient has had severe anxiety and intractable cough.  Palliative care is involved.  Pt is receiving Morphine scheduled.  Weight is 47 kg. Type of Study: Bedside Swallow Evaluation Previous Swallow Assessment: none Diet Prior to this Study: Regular;Thin liquids Temperature Spikes  Noted: No Respiratory Status: Room  air History of Recent Intubation: No Behavior/Cognition: Alert;Cooperative;Pleasant mood (though min drowsy during part of session secondary to morphine) Oral Cavity Assessment: Within Functional Limits Oral Care Completed by SLP: Recent completion by staff Oral Cavity - Dentition: Dentures, top;Dentures, bottom (in place) Vision: Functional for self-feeding Self-Feeding Abilities: Able to feed self;Needs set up Patient Positioning: Upright in bed Baseline Vocal Quality: Breathy;Hoarse;Low vocal intensity (gravely - WOB w/ any exertion) Volitional Cough: Weak;Congested (Strong at times) Volitional Swallow: Able to elicit    Oral/Motor/Sensory Function Overall Oral Motor/Sensory Function: Within functional limits   Ice Chips Ice chips: Not tested   Thin Liquid Thin Liquid: Impaired (intermittent) Presentation: Self Fed;Straw (20+ sips) Oral Phase Impairments:  (none) Pharyngeal  Phase Impairments: Cough - Immediate (intermittent -- noted ~6-7x) Other Comments: noted coughing moreso when taking multiple sips; less to no immediate coughing when taking single sips    Nectar Thick Nectar Thick Liquid: Not tested   Honey Thick Honey Thick Liquid: Not tested   Puree Puree: Impaired Presentation: Self Fed;Spoon (~6 ozs total) Oral Phase Impairments:  (none) Pharyngeal Phase Impairments: Cough - Delayed (x2)   Solid     Solid: Impaired Presentation: Self Fed;Spoon (5 trials) Oral Phase Impairments:  (none) Pharyngeal Phase Impairments: Cough - Delayed (x1)         Orinda Kenner, MS, CCC-SLP Speech Language Pathologist Rehab Services; [place] [phone] (ascom)  Nikky Duba 12/11/2021,3:58 PM

## 2021-12-12 MED ORDER — DIPHENHYDRAMINE HCL 12.5 MG/5ML PO ELIX: 12.5000 mg | ORAL_SOLUTION | Freq: Three times a day (TID) | ORAL | Status: DC | PRN

## 2021-12-12 MED ORDER — MORPHINE SULFATE (PF) 2 MG/ML IV SOLN: 1.0000 mg | INTRAVENOUS | Status: DC | PRN

## 2021-12-12 MED ORDER — ALUM & MAG HYDROXIDE-SIMETH 200-200-20 MG/5ML PO SUSP: 30.0000 mL | Freq: Three times a day (TID) | ORAL | Status: DC | PRN

## 2021-12-12 MED ORDER — GABAPENTIN 250 MG/5ML PO SOLN
800.0000 mg | Freq: Four times a day (QID) | ORAL | Status: DC
  Administered 2021-12-12 – 2021-12-16 (×15): 800 mg via ORAL
  Filled 2021-12-12 (×17): qty 16

## 2021-12-12 MED ORDER — HEPARIN SODIUM (PORCINE) 5000 UNIT/ML IJ SOLN
5000.0000 [IU] | Freq: Two times a day (BID) | INTRAMUSCULAR | Status: DC
  Administered 2021-12-12 – 2021-12-15 (×5): 5000 [IU] via SUBCUTANEOUS
  Filled 2021-12-12 (×7): qty 1

## 2021-12-12 MED ORDER — MORPHINE SULFATE (PF) 2 MG/ML IV SOLN
1.0000 mg | INTRAVENOUS | Status: DC | PRN
  Administered 2021-12-13 (×2): 1 mg via INTRAVENOUS
  Filled 2021-12-12 (×2): qty 1

## 2021-12-12 NOTE — Progress Notes (Signed)
[facility]) Hospital Liaison Note  Received request from [facility], Dominica Severin, for family interest in Hospice at Home. Met Deanna Jacobson, had multiple visitors in the room, requested to have hospice conversation tomorrow morning at 0900 when her son would be available.    Will plan to meet at bedside then.    Thank you,   Bea Laura, MSN RN [facility] Liaison [phone]

## 2021-12-12 NOTE — Progress Notes (Addendum)
PROGRESS NOTE    Deanna Jacobson  RFF:[id number] DOB: 15-Jan-1956 DOA: 12/03/2021 PCP: Donnie Coffin, MD    Brief Narrative:  66 year old with history of hypertension, previous smoker, recently treated with extended course of antibiotics for pneumonia had a routine CT scan of the chest by primary care physician's office and found to have extensive malignancy and sent to ER.  Patient with generalized symptoms of malaise, fatigue, cough and shortness of breath.  Admitted for symptomatic treatment, diagnosis.  Oncology consultation. Remains in the hospital for symptom control. Bronchoscopy and biopsy, consistent with small cell lung cancer. MRI brain with multiple metastatic lesions. Patient remains extremely anxious, worsening respiratory symptoms including wheezing.  Undergoing inpatient radiation treatment. Remains in the hospital to control symptoms, plans unknown, however clinically home with hospice looks appropriate.  Followed by palliative and oncology. 7/14- Dr. Rogue Bussing d/w patient Regis Bill re: code status, ongoing tx, hospice consideration, GOCs. She elected for DNR/DNI. Pursuing hospice at home.   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 Bronchoscopy 7/10, biopsy with small cell lung cancer. Receiving inpatient radiation. Completed 7 days of antibiotics, no benefits of ongoing antibiotic therapy.  Discontinue Rocephin. Symptom management with the steroids, bronchodilators, anxiety relief with Ativan and increasing dose of morphine for shortness of breath and restlessness. 2D echocardiogram with trivial pericardial effusion. Patient would likely benefit with home hospice evaluation given extensive disease and likely will not tolerate chemotherapy side effects given extensive anxiety and now with  respiratory functions. Given rapid progression of disease, she is likely end-of-life. Oncology and palliative team discussing with family.  Chronic pain syndrome: Patient on Flexeril and gabapentin and Celebrex.  Continued.   Patient is using low-dose Xanax and morphine that is helping her relieve anxiety and pain.    Essential hypertension: Blood pressures stable on amlodipine.  Occasionally using hydralazine.  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  Stable.  DVT prophylaxis: heparin injection 5,000 Units Start: 12/08/21 0600  SCDs   Code Status: DNR Family Communication: Son in the hallway Disposition Plan: Status is: Inpatient Remains inpatient appropriate because: Still with significant symptoms, now with difficulty eating. Palliative and hospice discussion ongoing.  Consultants:  Oncology Pulmonary Radiation oncology Palliative care  Procedures:  Bronch and biopsy  Antimicrobials:  Rocephin, 7/7--- 7/13   Subjective:  Patient seen and examined.  Anxious.  Difficulty eating regular food, frequent cough and audible wheezing.  Objective: Vitals:   12/11/21 1531 12/11/21 2001 12/11/21 2038 12/12/21 0511  BP: 138/76 130/71  130/71  Pulse: 95 85  92  Resp: 14 18  18   Temp: 98 F (36.7 C) 98.1 F (36.7 C)  97.7 F (36.5 C)  TempSrc:  Oral  Oral  SpO2: 92% (!) 86% 94% 91%  Weight:      Height:       No intake or output data in the 24 hours ending 12/12/21 0742   Filed Weights   12/08/21 0500 12/09/21 0333 12/11/21 0256  Weight: 49.7 kg 46.2 kg 47 kg   Examination:  Audibly wheezing and airway noise.  On room air.  Anxious. Abdomen soft nontender. Extremities without swelling, edema or cyanosis.  Data Reviewed: I have personally reviewed following labs and imaging studies  CBC: Recent Labs  Lab 12/08/21 0523  WBC 14.7*  HGB 8.1*  HCT 26.8*  MCV 74.2*  PLT 412*  Basic Metabolic Panel: No results for input(s): "NA", "K", "CL",  "CO2", "GLUCOSE", "BUN", "CREATININE", "CALCIUM", "MG", "PHOS" in the last 168 hours.  GFR: Estimated Creatinine Clearance (by C-G formula based on SCr of 0.71 mg/dL) Female: 50.6 mL/min Female: 59.6 mL/min Liver Function Tests: No results for input(s): "AST", "ALT", "ALKPHOS", "BILITOT", "PROT", "ALBUMIN" in the last 168 hours. No results for input(s): "LIPASE", "AMYLASE" in the last 168 hours. No results for input(s): "AMMONIA" in the last 168 hours. Coagulation Profile: No results for input(s): "INR", "PROTIME" in the last 168 hours. Cardiac Enzymes: No results for input(s): "CKTOTAL", "CKMB", "CKMBINDEX", "TROPONINI" in the last 168 hours. BNP (last 3 results) No results for input(s): "PROBNP" in the last 8760 hours. HbA1C: No results for input(s): "HGBA1C" in the last 72 hours. CBG: No results for input(s): "GLUCAP" in the last 168 hours. Lipid Profile: No results for input(s): "CHOL", "HDL", "LDLCALC", "TRIG", "CHOLHDL", "LDLDIRECT" in the last 72 hours. Thyroid Function Tests: No results for input(s): "TSH", "T4TOTAL", "FREET4", "T3FREE", "THYROIDAB" in the last 72 hours. Anemia Panel: No results for input(s): "VITAMINB12", "FOLATE", "FERRITIN", "TIBC", "IRON", "RETICCTPCT" in the last 72 hours.  Sepsis Labs: No results for input(s): "PROCALCITON", "LATICACIDVEN" in the last 168 hours.   Recent Results (from the past 240 hour(s))  SARS Coronavirus 2 by RT PCR (hospital order, performed in [facility] hospital lab) *cepheid single result test* Anterior Nasal Swab     Status: None   Collection Time: 12/03/21  8:05 PM   Specimen: Anterior Nasal Swab  Result Value Ref Range Status   SARS Coronavirus 2 by RT PCR NEGATIVE NEGATIVE Final    Comment: (NOTE) SARS-CoV-2 target nucleic acids are NOT DETECTED.  The SARS-CoV-2 RNA is generally detectable in upper and lower respiratory specimens during the acute phase of infection. The lowest concentration of SARS-CoV-2 viral copies  this assay can detect is 250 copies / mL. A negative result does not preclude SARS-CoV-2 infection and should not be used as the sole basis for treatment or other patient management decisions.  A negative result may occur with improper specimen collection / handling, submission of specimen other than nasopharyngeal swab, presence of viral mutation(s) within the areas targeted by this assay, and inadequate number of viral copies (<250 copies / mL). A negative result must be combined with clinical observations, patient history, and epidemiological information.  Fact Sheet for Patients:   https://www.patel.info/  Fact Sheet for Healthcare Providers: https://hall.com/  This test is not yet approved or  cleared by the [place] FDA and has been authorized for detection and/or diagnosis of SARS-CoV-2 by FDA under an Emergency Use Authorization (EUA).  This EUA will remain in effect (meaning this test can be used) for the duration of the COVID-19 declaration under Section 564(b)(1) of the Act, 21 U.S.C. section 360bbb-3(b)(1), unless the authorization is terminated or revoked sooner.  Performed at [facility], [address], [place], [place] [address]      Radiology Studies: Reviewed from radiology reports.  Scheduled Meds:  amLODipine  5 mg Oral Daily   arformoterol  15 mcg Nebulization BID   budesonide (PULMICORT) nebulizer solution  0.25 mg Nebulization BID   docusate sodium  100 mg Oral BID   escitalopram  10 mg Oral Daily   feeding supplement  1 Container Oral TID BM   gabapentin  800 mg Oral QID   heparin  5,000 Units Subcutaneous Q8H   lidocaine  1 patch Transdermal Q24H   methylPREDNISolone (SOLU-MEDROL) injection  80 mg Intravenous Q24H   multivitamin with minerals  1 tablet Oral Daily   nystatin  5 mL Mouth/Throat QID   revefenacin  175 mcg Nebulization Daily   sodium chloride flush  3 mL Intravenous Q12H    traZODone  100 mg Oral QHS   Continuous Infusions:   LOS: 9 days   Time spent: 35 minutes  Vanna Scotland, MD [facility] Pager [phone]

## 2021-12-12 NOTE — Progress Notes (Signed)
Patient on room air with low sats. States has been wearing o2 off and on. BBS noted coarse consolidation throughout. NPC noted. Placed patient on o2 6 liters. Saturation noted to increase. Educated patient to continue o2 therapy.  Rn aware

## 2021-12-12 NOTE — TOC Progression Note (Signed)
Transition of Care [facility]) - Progression Note    Patient Details  Name: Deanna Jacobson MRN: [id number] Date of Birth: [birth year]  Transition of Care [facility]) CM/SW Contact  Izola Price, RN Phone Number: 12/12/2021, 11:44 AM  Clinical Narrative: 7/15: Consulted [facility] weekend consult for Hospice [facility] per provider request and patient verbal agreement. Palliative following. Simmie Davies RN CM    Expected Discharge Plan: Home/Self Care Barriers to Discharge: Continued Medical Work up  Expected Discharge Plan and Services Expected Discharge Plan: Home/Self Care In-house Referral: Chaplain Discharge Planning Services: CM Consult   Living arrangements for the past 2 months: Single Family Home                                       Social Determinants of Health (SDOH) Interventions    Readmission Risk Interventions    12/04/2021    3:23 PM  Readmission Risk Prevention Plan  Transportation Screening Complete  PCP or Specialist Appt within 5-7 Days Complete  Home Care Screening Complete  Medication Review (RN CM) Complete

## 2021-12-12 NOTE — Progress Notes (Signed)
   12/12/21 1915  Mobility  HOB Elevated/Bed Position Self regulated  Activity Ambulated independently to bathroom;Ambulated independently in room  Range of Motion/Exercises Active;All extremities  Level of Assistance Standby assist, set-up cues, supervision of patient - no hands on  Assistive Device None  Distance Ambulated (ft) 20 ft  Activity Response Tolerated well

## 2021-12-13 DIAGNOSIS — D509 Iron deficiency anemia, unspecified: Secondary | ICD-10-CM

## 2021-12-13 DIAGNOSIS — R5381 Other malaise: Secondary | ICD-10-CM

## 2021-12-13 NOTE — TOC Progression Note (Addendum)
Transition of Care [facility]) - Progression Note    Patient Details  Name: Deanna Jacobson MRN: [id number] Date of Birth: May 22, 1956  Transition of Care [facility]) CM/SW Contact  Izola Price, RN Phone Number: 12/13/2021, 1:04 PM  Clinical Narrative:  7/18: Hospice liaison note indicates that patient now wishes to forgo hospice and continue radiation. Palliative following. Provider update and revaluating discharge plan. Currently on 6L/[place] oxygen. Simmie Davies RN CM    Update: Patient is now down to 3L/[employer]. Contacted and left VM with [employer] for home set up and transportation oxygen. [facility] will accept for [facility] services when ordered. Requested DME oxygen, any DME needs, and HH orders from provider. Simmie Davies RN CM   [employer] notified of DME oxygen home/transport needs pending orders and ambulation test. Simmie Davies RN CM   Ambulation test for 3L/[place] so condenser can be used. DC after radiation treatment 7/17 and after oxygen delivered per [employer]. Simmie Davies RN CM 430 pm.   Expected Discharge Plan: Home/Self Care Barriers to Discharge: Continued Medical Work up  Expected Discharge Plan and Services Expected Discharge Plan: Home/Self Care In-house Referral: Chaplain Discharge Planning Services: CM Consult   Living arrangements for the past 2 months: Single Family Home Expected Discharge Date: 12/13/21                                     Social Determinants of Health (SDOH) Interventions    Readmission Risk Interventions    12/04/2021    3:23 PM  Readmission Risk Prevention Plan  Transportation Screening Complete  PCP or Specialist Appt within 5-7 Days Complete  Home Care Screening Complete  Medication Review (RN CM) Complete

## 2021-12-13 NOTE — Progress Notes (Signed)
SATURATION QUALIFICATIONS: (This note is used to comply with regulatory documentation for home oxygen)  Patient Saturations on Room Air at Rest = 83%  Patient Saturations on Room Air while Ambulating = 79%  Patient Saturations on 3 Liters of oxygen while Ambulating = 93%  Please briefly explain why patient needs home oxygen:

## 2021-12-13 NOTE — Discharge Summary (Signed)
Physician Discharge Summary   Patient: Deanna Jacobson MRN: [id number] DOB: 1955/08/26  Admit date:     12/03/2021  Discharge date: 12/13/21  Discharge Physician: Vanna Scotland   PCP: Donnie Coffin, MD     Discharge Diagnoses: Principal Problem:   SOB (shortness of breath) Active Problems:   Acute renal failure (ARF) (HCC)   Iron deficiency anemia   Reflux esophagitis   Disorder of skeletal system   Hyponatremia   Palliative care encounter   Malnutrition of moderate degree   Malignant neoplasm of lung ([place])  Resolved Problems:   * No resolved hospital problems. *  Hospital Course: 66 year old with history of hypertension, previous smoker, recently treated with extended course of antibiotics for pneumonia had a routine CT scan of the chest by primary care physician's office and found to have extensive malignancy and sent to ER.  Patient with generalized symptoms of malaise, fatigue, cough and shortness of breath.  Admitted for symptomatic treatment, diagnosis.  Oncology consultation. Remains in the hospital for symptom control. Bronchoscopy and biopsy, consistent with small cell lung cancer. MRI brain with multiple metastatic lesions. Patient remains extremely anxious, worsening respiratory symptoms including wheezing.  Undergoing inpatient radiation treatment. Remains in the hospital to control symptoms, plans unknown, however clinically home with hospice looks appropriate.  Followed by palliative and oncology. 7/14- Dr. Rogue Bussing d/w patient Deanna Jacobson re: code status, ongoing tx, hospice consideration, GOCs. She elected for DNR/DNI. Pursuing hospice at home.  7/15- requested hospice evaluation, patient declined due to family members in room, hospice to re-eval 7/16 7/16- arrangements for hospice at home.  Assessment and Plan: * SOB (shortness of breath) Attribute to ct finding of suspected lung cancer and postobstructive PNA. With elevated procalcitonin. Supplemental oxygen and  duoneb therapy Oncology consult for mediastinal mass evaluation. .    Acute renal failure (ARF) ([place]) Lab Results  Component Value Date   CREATININE 0.73 12/03/2021   CREATININE 0.70 12/02/2021   CREATININE 0.98 06/13/2018  resolved.     Iron deficiency anemia    Latest Ref Rng & Units 12/03/2021    3:31 PM 02/01/2019    3:22 PM 12/23/2016    4:15 PM  CBC  WBC 4.0 - 10.5 K/uL 8.8  9.6  6.5   Hemoglobin 12.0 - 15.0 g/dL 9.3  11.5  12.8   Hematocrit 36.0 - 46.0 % 29.5  34.4  39.9   Platelets 150 - 400 K/uL 472  274  325    New we will follow . Type and screen. Iv ppi.   Reflux esophagitis IV ppi.    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 Pt stopped smoking in 2019.    Hyponatremia Due to SIADH, most likely.  We will cont  With fluid restriction . Salt tablets.          Procedures performed: biopsy Disposition: hospice at home Diet recommendation:  Discharge Diet Orders (From admission, onward)     Start     Ordered   12/13/21 0000  Diet - low sodium heart healthy        12/13/21 0746            DISCHARGE MEDICATION: Allergies as of 12/13/2021       Reactions   Codeine Nausea And Vomiting        Medication List     TAKE these medications    Acetaminophen 8 Hour 650 MG CR tablet Generic drug: acetaminophen Take 1-2 tablets by mouth every 8 (eight)  hours as needed.   albuterol 108 (90 Base) MCG/ACT inhaler Commonly known as: VENTOLIN HFA Inhale 1 puff into the lungs every 4 (four) hours as needed.   amLODipine 2.5 MG tablet Commonly known as: NORVASC Take 5 mg by mouth.   amLODipine 10 MG tablet Commonly known as: NORVASC Take 10 mg by mouth daily.   celecoxib 100 MG capsule Commonly known as: CELEBREX Take 1 capsule (100 mg total) by mouth 2 (two) times daily.   cyclobenzaprine 10 MG tablet Commonly known as: FLEXERIL Take 1 tablet (10 mg total) by mouth 3 (three) times daily as needed for muscle  spasms.   escitalopram 20 MG tablet Commonly known as: LEXAPRO Take 20 mg by mouth daily.   gabapentin 600 MG tablet Commonly known as: NEURONTIN Take 1 tablet (600 mg total) by mouth 3 (three) times daily.   gabapentin 800 MG tablet Commonly known as: NEURONTIN Take 800 mg by mouth 4 (four) times daily.   lisinopril 20 MG tablet Commonly known as: ZESTRIL Take 1 tablet (20 mg total) by mouth daily.   nabumetone 500 MG tablet Commonly known as: RELAFEN Take 500 mg by mouth 2 (two) times daily.   omeprazole 20 MG capsule Commonly known as: PRILOSEC Take 1 capsule (20 mg total) by mouth 2 (two) times daily for 14 days.   traZODone 100 MG tablet Commonly known as: DESYREL Take 100 mg by mouth at bedtime.        Discharge Exam: Filed Weights   12/08/21 0500 12/09/21 0333 12/11/21 0256  Weight: 49.7 kg 46.2 kg 47 kg    Audibly wheezing and airway noise.  On room air.  Anxious. Abdomen soft nontender. Extremities without swelling, edema or cyanosis.  Condition at discharge: stable  The results of significant diagnostics from this hospitalization (including imaging, microbiology, ancillary and laboratory) are listed below for reference.   Imaging Studies: ECHOCARDIOGRAM LIMITED  Result Date: 12/09/2021    ECHOCARDIOGRAM LIMITED REPORT   Patient Name:   Deanna Jacobson Date of Exam: 12/09/2021 Medical Rec #:  [id number]         Height:       61.0 in Accession #:    [id number]        Weight:       101.9 lb Date of Birth:  Aug 08, 1955        BSA:          1.418 m Patient Age:    66 years          BP:           144/87 mmHg Patient Gender: F                 HR:           86 bpm. Exam Location:  [facility] Procedure: 2D Echo, Cardiac Doppler and Color Doppler Indications:     Aortic valve stenosis I35.9  History:         Patient has prior history of Echocardiogram examinations, most                  recent 12/04/2021.  Sonographer:     Sherrie Sport Referring Phys:  [id number] [facility] AMERY  Diagnosing Phys: Kate Sable MD  Sonographer Comments: Technically challenging study due to limited acoustic windows, no apical window and suboptimal subcostal window. Image acquisition challenging due to COPD and Image acquisition challenging due to patient body habitus. IMPRESSIONS  1. Left ventricular ejection fraction, by estimation, is 60  to 65%. The left ventricle has normal function.  2. The mitral valve is normal in structure. No evidence of mitral valve regurgitation.  3. Aortic valve hemodynamics /gradient not obtained likely due to body habitus. Unable to fully assess aortic valve calcifications or stenosis.. The aortic valve is calcified. FINDINGS  Left Ventricle: Left ventricular ejection fraction, by estimation, is 60 to 65%. The left ventricle has normal function. Pericardium: Trivial pericardial effusion is present. Mitral Valve: The mitral valve is normal in structure. Aortic Valve: Aortic valve hemodynamics /gradient not obtained likely due to body habitus. Unable to fully assess aortic valve calcifications or stenosis. The aortic valve is calcified. Aorta: The aortic root is normal in size and structure. LEFT VENTRICLE PLAX 2D LVIDd:         3.50 cm LVIDs:         2.30 cm LV PW:         1.10 cm LV IVS:        1.30 cm  LEFT ATRIUM         Index LA diam:    3.80 cm 2.68 cm/m   AORTA Ao Root diam: 2.80 cm TRICUSPID VALVE TR Peak grad:   38.7 mmHg TR Vmax:        311.00 cm/s Kate Sable MD Electronically signed by Kate Sable MD Signature Date/Time: 12/09/2021/2:48:30 PM    Final    MR BRAIN W WO CONTRAST  Result Date: 12/09/2021 CLINICAL DATA:  Initial evaluation for history of non-small cell lung cancer, evaluate for metastatic disease. EXAM: MRI HEAD WITHOUT AND WITH CONTRAST TECHNIQUE: Multiplanar, multiecho pulse sequences of the brain and surrounding structures were obtained without and with intravenous contrast. CONTRAST:  64mL GADAVIST GADOBUTROL 1 MMOL/ML IV SOLN  COMPARISON:  Prior head CT from 11/27/2007. FINDINGS: Brain: Cerebral volume within normal limits. Patchy T2/FLAIR hyperintensity involving the periventricular and deep white matter both cerebral hemispheres most consistent with chronic small vessel ischemic disease, mild to moderate in nature. No evidence for acute or subacute infarct. No areas of chronic cortical infarction. No acute or chronic intracranial blood products. Multiple scattered cystic masses are seen involving both cerebral hemispheres as well as the cerebellum, consistent with metastatic disease. There are at least 8 lesions in total. For reference purposes, the largest lesion involving the left cerebral hemisphere is positioned at the left parieto-occipital region and measures 3.3 x 2.3 x 2.5 cm (series 18, image 101). The largest lesion within the right cerebral hemisphere involves the peripheral right frontal lobe in measures 1.5 x 1.6 x 1.3 cm (series 18, image 129). Largest cerebellar lesion seen within the peripheral right cerebellum and measures 1.3 x 2.0 x 1.7 cm (series 18, image 35). These lesions are predominantly cystic in nature with peripheral postcontrast enhancement. No significant surrounding vasogenic edema or regional mass effect. No midline shift. Basilar cisterns remain patent. Ventricles normal size without hydrocephalus. No extra-axial fluid collection. Pituitary gland and suprasellar region within normal limits. Vascular: Major intracranial vascular flow voids are maintained. Skull and upper cervical spine: Craniocervical junction within normal limits. Bone marrow signal intensity normal. No focal marrow replacing lesion. No scalp soft tissue abnormality. Sinuses/Orbits: Globes orbital soft tissues within normal limits. Paranasal sinuses are largely clear. Small left mastoid effusion noted, of doubtful significance. Other: None. IMPRESSION: 1. Multiple scattered cystic masses involving both cerebral hemispheres and  cerebellum, consistent with intracranial metastatic disease. No significant surrounding vasogenic edema or regional mass effect. 2. No other acute intracranial abnormality. 3. Underlying mild-to-moderate  chronic microvascular ischemic disease. Electronically Signed   By: Jeannine Boga M.D.   On: 12/09/2021 04:15   CT ABDOMEN PELVIS W CONTRAST  Result Date: 12/08/2021 CLINICAL DATA:  Small cell lung cancer, for staging EXAM: CT ABDOMEN AND PELVIS WITH CONTRAST TECHNIQUE: Multidetector CT imaging of the abdomen and pelvis was performed using the standard protocol following bolus administration of intravenous contrast. RADIATION DOSE REDUCTION: This exam was performed according to the departmental dose-optimization program which includes automated exposure control, adjustment of the mA and/or kV according to patient size and/or use of iterative reconstruction technique. CONTRAST:  135mL OMNIPAQUE IOHEXOL 300 MG/ML  SOLN COMPARISON:  Partial comparison to CT chest dated 12/02/2021 FINDINGS: Lower chest: Small left pleural effusion, progressive. Additional left lower lobe tumor is better described on recent CT. Hepatobiliary: Liver is within normal limits. No suspicious enhancing hepatic lesions. Layering tiny gallstones in the gallbladder fundus (series 2/image 29), without associated inflammatory changes. No intrahepatic or extrahepatic ductal dilatation. Pancreas: Within normal limits. Spleen: Within normal limits. Adrenals/Urinary Tract: Adrenal glands are within normal limits. Kidneys are within normal limits.  No hydronephrosis. Bladder is within normal limits. Stomach/Bowel: Stomach is within normal limits. No evidence of bowel obstruction. Normal appendix (series 2/image 84). Moderate colonic stool burden, suggesting constipation. Vascular/Lymphatic: No evidence of abdominal aortic aneurysm. Atherosclerotic calcifications of the abdominal aorta and branch vessels. No suspicious abdominopelvic  lymphadenopathy. Reproductive: Uterus is within normal limits. No adnexal masses. Other: No abdominopelvic ascites. Musculoskeletal: No focal osseous lesions. IMPRESSION: No findings suspicious for metastatic disease in the abdomen/pelvis. Small left pleural effusion, mildly progressive. Electronically Signed   By: Julian Hy M.D.   On: 12/08/2021 22:28   ECHOCARDIOGRAM COMPLETE  Result Date: 12/08/2021    ECHOCARDIOGRAM REPORT   Patient Name:   Deanna Jacobson Date of Exam: 12/04/2021 Medical Rec #:  [id number]         Height:       61.0 in Accession #:    [id number]        Weight:       101.2 lb Date of Birth:  [birth year]        BSA:          1.414 m Patient Age:    46 years          BP:           151/62 mmHg Patient Gender: F                 HR:           94 bpm. Exam Location:  [facility] Procedure: 2D Echo, Cardiac Doppler and Color Doppler Indications:     Pericardial Effusion I31.3  History:         Patient has no prior history of Echocardiogram examinations.                  Risk Factors:Hypertension.  Sonographer:     Sherrie Sport Referring Phys:  [id number] CARMEN L GONZALEZ Diagnosing Phys: Ida Rogue MD  Sonographer Comments: Technically challenging study due to limited acoustic windows, no apical window and no subcostal window. IMPRESSIONS  1. Left ventricular ejection fraction, by estimation, is 60 to 65%. The left ventricle has normal function. The left ventricle has no regional wall motion abnormalities. There is mild left ventricular hypertrophy. Left ventricular diastolic parameters are indeterminate.  2. Right ventricular systolic function is normal. The right ventricular size is normal. Tricuspid regurgitation signal is inadequate for assessing PA pressure.  3. A small pericardial effusion is present. There is no evidence of cardiac tamponade.  4. The mitral valve is normal in structure. No evidence of mitral valve regurgitation. No evidence of mitral stenosis.  5. The aortic valve is normal in  structure. There is moderate calcification of the aortic valve. Aortic valve regurgitation is not visualized. Aortic valve sclerosis/calcification is present, unable to exclude stenosis as gradient not obtained.  6. The inferior vena cava is normal in size with greater than 50% respiratory variability, suggesting right atrial pressure of 3 mmHg. FINDINGS  Left Ventricle: Left ventricular ejection fraction, by estimation, is 60 to 65%. The left ventricle has normal function. The left ventricle has no regional wall motion abnormalities. The left ventricular internal cavity size was normal in size. There is  mild left ventricular hypertrophy. Left ventricular diastolic parameters are indeterminate. Right Ventricle: The right ventricular size is normal. No increase in right ventricular wall thickness. Right ventricular systolic function is normal. Tricuspid regurgitation signal is inadequate for assessing PA pressure. Left Atrium: Left atrial size was normal in size. Right Atrium: Right atrial size was normal in size. Pericardium: A small pericardial effusion is present. There is no evidence of cardiac tamponade. Mitral Valve: The mitral valve is normal in structure. No evidence of mitral valve regurgitation. No evidence of mitral valve stenosis. Tricuspid Valve: The tricuspid valve is normal in structure. Tricuspid valve regurgitation is not demonstrated. No evidence of tricuspid stenosis. Aortic Valve: The aortic valve is normal in structure. There is moderate calcification of the aortic valve. Aortic valve regurgitation is not visualized. Aortic valve sclerosis/calcification is present, without any evidence of aortic stenosis. Pulmonic Valve: The pulmonic valve was normal in structure. Pulmonic valve regurgitation is not visualized. No evidence of pulmonic stenosis. Aorta: The aortic root is normal in size and structure. Venous: The inferior vena cava is normal in size with greater than 50% respiratory variability,  suggesting right atrial pressure of 3 mmHg. IAS/Shunts: No atrial level shunt detected by color flow Doppler.  LEFT VENTRICLE PLAX 2D LVIDd:         3.70 cm LVIDs:         1.50 cm LV PW:         1.20 cm LV IVS:        1.30 cm LVOT diam:     2.00 cm LVOT Area:     3.14 cm  LEFT ATRIUM         Index LA diam:    3.70 cm 2.62 cm/m   AORTA Ao Root diam: 2.77 cm  SHUNTS Systemic Diam: 2.00 cm Ida Rogue MD Electronically signed by Ida Rogue MD Signature Date/Time: 12/04/2021/5:15:56 PM    Final (Updated)    DG Chest Port 1 View  Result Date: 12/07/2021 CLINICAL DATA:  Status post bronchoscopy, shortness of breath EXAM: PORTABLE CHEST 1 VIEW COMPARISON:  12/03/2021 FINDINGS: Bilateral interstitial thickening. Right upper lobe airspace disease peripherally. Faint right upper lobe airspace opacity. Lingular and left lower lobe airspace disease. No pleural effusion or pneumothorax. Mediastinal lymphadenopathy. Stable cardiomegaly. No acute osseous abnormality. IMPRESSION: 1. Bilateral patchy airspace disease and interstitial thickening unchanged from the CT of the chest dated 12/02/2021. 2. Persistent mediastinal lymphadenopathy. 3. No pneumothorax. Electronically Signed   By: Kathreen Devoid M.D.   On: 12/07/2021 14:38   DG Chest 2 View  Result Date: 12/03/2021 CLINICAL DATA:  Shortness of breath. EXAM: CHEST - 2 VIEW COMPARISON:  Radiograph November 23, 2021 and chest CT December 02, 2021 FINDINGS: Normal size heart. Aortic atherosclerosis. Similar widening of the upper mediastinum previously characterized as an upper mediastinal mass on CT December 02, 2021. Similar multifocal pulmonary opacities. No pleural effusion or pneumothorax. The visualized skeletal structures are unchanged. IMPRESSION: Similar multifocal pulmonary opacities and mediastinal widening reflecting the large mediastinal mass seen on previous day CT. No new focal areas of consolidation or overt pulmonary edema. Electronically Signed   By: Dahlia Bailiff  M.D.   On: 12/03/2021 16:30   CT CHEST W CONTRAST  Result Date: 12/03/2021 CLINICAL DATA:  Chronic cough for 1 month. EXAM: CT CHEST WITH CONTRAST TECHNIQUE: Multidetector CT imaging of the chest was performed during intravenous contrast administration. RADIATION DOSE REDUCTION: This exam was performed according to the departmental dose-optimization program which includes automated exposure control, adjustment of the mA and/or kV according to patient size and/or use of iterative reconstruction technique.  body onc  CONTRAST:  68mL OMNIPAQUE IOHEXOL 300 MG/ML  SOLN COMPARISON:  None Available. FINDINGS: Cardiovascular: Mild cardiac enlargement. Small to moderate pericardial effusion is identified, which measures 1.4 cm in thickness over the right atrium. Aortic atherosclerosis. Coronary artery calcifications. Mediastinum/Nodes: Thyroid gland appears normal. Large mediastinal mass is identified which encases/narrows the trachea and bilateral mainstem bronchi as well as the esophagus. This measures 9.1 by 9.1 x 6.3 cm. There is marked mass effect and possible invasion of the superior vena cava which is significantly narrowed. There is encasement with narrowing of the main pulmonary arteries bilaterally. Mass effect and possible invasion of the left atrium is also noted. Encasement and narrowing with possible invasion of bilateral superior pulmonary veins noted. There is obstruction of the esophagus just above the level of the carina with postobstructive dilatation of the proximal esophagus. Wall thickening of the distal esophagus is noted with esophageal varices identified, image 93/2. Lungs/Pleura: No pleural effusion identified. 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 Postobstructive atelectasis and endobronchial spread of tumor is identified extending into the periphery of the left lower lobe. Within the posterior  right upper lobe there is a subpleural soft tissue mass which extends medially along the major fissure measures 3.2 x 2.1 cm, image 33/3. Patchy areas of subsegmental atelectasis and ground-glass attenuation is noted within both upper lung zones which likely reflects postobstructive pneumonitis. Upper Abdomen: No acute abnormality.  Small hiatal hernia noted. Musculoskeletal: No chest wall abnormality. No acute or significant osseous findings. IMPRESSION: 1. 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 There is marked mass effect and possible invasion of the superior vena cava which is significantly narrowed. There is encasement with narrowing of the main pulmonary arteries bilaterally. There is also encasement and narrowing of the bilateral superior pulmonary veins. Obstruction of the midthoracic esophagus is also noted. Tumor involvement of the pulmonary veins and left atrium is also noted. 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 Differential considerations include esophageal neoplasm or lymphoproliferative disorder. 2. Perihilar left lung mass is noted with endobronchial spread and postobstructive changes noted. This also invades the inferior left pulmonary vein. 3. Additional soft tissue mass within the posterior right upper lobe is identified which extends medially along the major fissure 4. Postobstructive atelectasis and endobronchial spread of tumor is identified extending into the periphery of the left lower lobe. 5. Small to moderate pericardial effusion. 6. Coronary artery calcifications. 7. Small hiatal hernia. 8. Aortic Atherosclerosis (ICD10-I70.0).  Electronically Signed   By: Kerby Moors M.D.   On: 12/03/2021 06:15   DG Chest 2 View  Result Date: 11/23/2021 CLINICAL DATA:  Dry cough, shortness of breath, symptoms for 1-2 months, heart murmur, former smoker, hypertension EXAM: CHEST - 2 VIEW COMPARISON:   11/27/2007 FINDINGS: Upper normal size of cardiac silhouette. Atherosclerotic calcification aorta. Abnormal RIGHT paratracheal border, question adenopathy versus mass. Bronchitic changes and hyperinflation with interstitial infiltrates in the mid to lower lungs bilaterally. No pleural effusion or pneumothorax. Bones demineralized. IMPRESSION: Bronchitic changes with infiltrates in the mid to lower lungs bilaterally question pneumonia. RIGHT paratracheal mass versus adenopathy; CT chest with contrast recommended to exclude mass/adenopathy. Aortic Atherosclerosis (ICD10-I70.0). These results will be called to the ordering clinician or representative by the Radiologist Assistant, and communication documented in the PACS or [employer]. Electronically Signed   By: Lavonia Dana M.D.   On: 11/23/2021 16:11    Microbiology: Results for orders placed or performed during the hospital encounter of 12/03/21  SARS Coronavirus 2 by RT PCR (hospital order, performed in [facility] hospital lab) *cepheid single result test* Anterior Nasal Swab     Status: None   Collection Time: 12/03/21  8:05 PM   Specimen: Anterior Nasal Swab  Result Value Ref Range Status   SARS Coronavirus 2 by RT PCR NEGATIVE NEGATIVE Final    Comment: (NOTE) SARS-CoV-2 target nucleic acids are NOT DETECTED.  The SARS-CoV-2 RNA is generally detectable in upper and lower respiratory specimens during the acute phase of infection. The lowest concentration of SARS-CoV-2 viral copies this assay can detect is 250 copies / mL. A negative result does not preclude SARS-CoV-2 infection and should not be used as the sole basis for treatment or other patient management decisions.  A negative result may occur with improper specimen collection / handling, submission of specimen other than nasopharyngeal swab, presence of viral mutation(s) within the areas targeted by this assay, and inadequate number of viral copies (<250 copies / mL). A negative  result must be combined with clinical observations, patient history, and epidemiological information.  Fact Sheet for Patients:   https://www.patel.info/  Fact Sheet for Healthcare Providers: https://hall.com/  This test is not yet approved or  cleared by the [place] FDA and has been authorized for detection and/or diagnosis of SARS-CoV-2 by FDA under an Emergency Use Authorization (EUA).  This EUA will remain in effect (meaning this test can be used) for the duration of the COVID-19 declaration under Section 564(b)(1) of the Act, 21 U.S.C. section 360bbb-3(b)(1), unless the authorization is terminated or revoked sooner.  Performed at [facility], [address] [place], [place] [address]     Labs: CBC: Recent Labs  Lab 12/08/21 0523  WBC 14.7*  HGB 8.1*  HCT 26.8*  MCV 74.2*  PLT 962*   Basic Metabolic Panel: No results for input(s): "NA", "K", "CL", "CO2", "GLUCOSE", "BUN", "CREATININE", "CALCIUM", "MG", "PHOS" in the last 168 hours. Liver Function Tests: No results for input(s): "AST", "ALT", "ALKPHOS", "BILITOT", "PROT", "ALBUMIN" in the last 168 hours. CBG: No results for input(s): "GLUCAP" in the last 168 hours.  Discharge time spent: less than 30 minutes.  Signed: Vanna Scotland, MD [facility] 12/13/2021

## 2021-12-13 NOTE — Progress Notes (Signed)
[facility]) Hospital Liaison Note   Met with Ms. Loper, her significant other, her son-Jimmy, her father Jaquelyn Bitter, and her half brother; Received request from [facility]) to discuss hospice services. Ms. Steinbach and her family discussed wanting to continue radiation, and forego hospice at this time.   Agreeable to palliative referral upon discharge.   Hospice information and phone number left with family for any further questions.    Thank you,   Bea Laura, MSN RN [facility] Liaison [phone]

## 2021-12-14 ENCOUNTER — Ambulatory Visit: Payer: Medicare Other

## 2021-12-14 DIAGNOSIS — R0602 Shortness of breath: Secondary | ICD-10-CM | POA: Diagnosis not present

## 2021-12-14 MED ORDER — HYDROCORTISONE 1 % EX CREA
TOPICAL_CREAM | Freq: Three times a day (TID) | CUTANEOUS | Status: DC | PRN
Start: 2021-12-14 — End: 2021-12-16
  Filled 2021-12-14: qty 28

## 2021-12-14 MED ORDER — HYDROCOD POLI-CHLORPHE POLI ER 10-8 MG/5ML PO SUER
5.0000 mL | Freq: Two times a day (BID) | ORAL | Status: DC
  Administered 2021-12-14 – 2021-12-16 (×4): 5 mL via ORAL
  Filled 2021-12-14 (×4): qty 5

## 2021-12-14 NOTE — TOC Progression Note (Addendum)
Transition of Care [facility]) - Progression Note    Patient Details  Name: Deanna Jacobson MRN: [id number] Date of Birth: 24-Aug-1955  Transition of Care [facility]) CM/SW [place], RN Phone Number: 12/14/2021, 8:58 AM  Clinical Narrative:   Contacted [employer] for oxygen, and provided the order for home oxygen, as they did not have it on file.  As per Andree Coss, order placed, will be delivered to room today in preparation for discharge.   Addendum 1023am:  Patient is scheduled for radiation at 1430 today, and plans to discharge following radiation.   Expected Discharge Plan: Home/Self Care Barriers to Discharge: Continued Medical Work up  Expected Discharge Plan and Services Expected Discharge Plan: Home/Self Care In-house Referral: Chaplain Discharge Planning Services: CM Consult   Living arrangements for the past 2 months: Single Family Home Expected Discharge Date: 12/14/21                                     Social Determinants of Health (SDOH) Interventions    Readmission Risk Interventions    12/04/2021    3:23 PM  Readmission Risk Prevention Plan  Transportation Screening Complete  PCP or Specialist Appt within 5-7 Days Complete  Home Care Screening Complete  Medication Review (RN CM) Complete

## 2021-12-14 NOTE — Progress Notes (Signed)
Deanna Jacobson   DOB:[birth year]   UM#:[id number]    Subjective: Patient continues to have shortness of breath/cough.  Complains of choking with food/liquids.  No hemoptysis.  Currently undergoing palliative radiation for superior vena cava syndrome/significant mediastinal adenopathy.   Accompanied by her son.  Objective:  Vitals:   12/14/21 1646 12/14/21 1950  BP: (!) 154/92 (!) 144/78  Pulse: 92 96  Resp: 20 18  Temp: 98 F (36.7 C) 98.3 F (36.8 C)  SpO2: 96% 91%    No intake or output data in the 24 hours ending 12/14/21 2103   Physical Exam Vitals and nursing note reviewed.  HENT:     Head: Normocephalic and atraumatic.     Mouth/Throat:     Pharynx: Oropharynx is clear.  Eyes:     Extraocular Movements: Extraocular movements intact.     Pupils: Pupils are equal, round, and reactive to light.  Cardiovascular:     Rate and Rhythm: Normal rate and regular rhythm.  Pulmonary:     Comments: Decreased breath sounds bilaterally.  Diffuse coarse breath sounds/wheezing. Abdominal:     Palpations: Abdomen is soft.  Musculoskeletal:        General: Normal range of motion.     Cervical back: Normal range of motion.  Skin:    General: Skin is warm.  Neurological:     General: No focal deficit present.     Mental Status: She is alert and oriented to person, place, and time.  Psychiatric:        Behavior: Behavior normal.        Judgment: Judgment normal.      Labs:  Lab Results  Component Value Date   WBC 14.7 (H) 12/08/2021   HGB 8.1 (L) 12/08/2021   HCT 26.8 (L) 12/08/2021   MCV 74.2 (L) 12/08/2021   PLT 412 (H) 12/08/2021   NEUTROABS 4.2 12/23/2016    Lab Results  Component Value Date   NA 131 (L) 12/05/2021   K 4.1 12/05/2021   CL 95 (L) 12/05/2021   CO2 27 12/05/2021    Studies:  No results found.  # # 66 y.o.  adult with multiple medical problems including smoking/COPD/ currently admitted to hospital for worsening shortness of breath/cough.  CT scan  shows mediastinal mass/lymphadenopathy encasing the vascular structures; trachea/esophagus; invading left atrium; left upper lobe mass.   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  Monitor closely.  # # Anxiety-situational/secondary to underlying diagnosis.  S/p evaluation with Josh Borders.   #Choking symptoms: ?  Likely secondary to malignancy.  Continue supportive care.  #CODE STATUS: DNR/DNI  #Plan of care: Had a long schedule patient and son regarding overall futility of ongoing treatment/radiation given the lack of significant response noted so far.  It is very unlikely patient will have a significant benefit from ongoing radiation.  Discussed with Dr. Donella Stade; and [facility].  Patient's son awaiting discussion with Dr. Donella Stade later in the afternoon.  Most likely plan disposition under hospice care.   Cammie Sickle, MD 12/14/2021  9:03 PM

## 2021-12-14 NOTE — Progress Notes (Signed)
SATURATION QUALIFICATIONS: (This note is used to comply with regulatory documentation for home oxygen)  Patient Saturations on Room Air at Rest = 86%  Patient Saturations on Room Air while Ambulating = 84%  Patient Saturations on 3 Liters of oxygen while Ambulating = 94%  Please briefly explain why patient needs home oxygen:

## 2021-12-14 NOTE — Progress Notes (Addendum)
PROGRESS NOTE    Deanna Jacobson  ZTI:[id number] DOB: [birth year] DOA: 12/03/2021 PCP: Donnie Coffin, MD    Brief Narrative:  66 year old with history of hypertension, previous smoker, recently treated with extended course of antibiotics for pneumonia had a routine CT scan of the chest by primary care physician's office and found to have extensive malignancy and sent to ER.  Patient with generalized symptoms of malaise, fatigue, cough and shortness of breath.  Admitted for symptomatic treatment, diagnosis.  Oncology consultation. Remains in the hospital for symptom control. Bronchoscopy and biopsy, consistent with small cell lung cancer. MRI brain with multiple metastatic lesions. Patient remains extremely anxious, worsening respiratory symptoms including wheezing.  Undergoing inpatient radiation treatment. Remains in the hospital to control symptoms, plans unknown, however clinically home with hospice looks appropriate.  Followed by palliative and oncology. 7/14- Dr. Rogue Bussing d/w patient Deanna Jacobson re: code status, ongoing tx, hospice consideration, GOCs. She elected for DNR/DNI. Pursuing hospice at home.  7/16- patient declined hospice want to continue radiation treatments 7/17- Patient seen and evaluated, Son, Deanna Jacobson very upset that he does not feel patient is ready to go home, and he was told my nursing staff and respiratory staff to "appeal the discharge" He states his father also had cancer and was "mislead" by oncologist. He feels that mother is "too sick" to go home and needs further breathing treatments. He also states he and his mother have discussion trajectory of care and at this time they want to continue breathing treatments and palliative radiation therapy. Patient on 3 L [place], but does not wear oxygen when ambulating out of bed. Discussed with nursing staff to encourage O2 wear while ambulating as tolerated. Continue weaning as tolerated. I was able to have a good discussion with Deanna Jacobson  and he was much more pleasant at the end of my rounding discussion.    Assessment & Plan:  #SMALL CELL LUNG CANCER - Stage IV, Brain metastasis - incurable per oncology. - receiving mediastinal adenopathy fraction (today is #4)  - patient reluctant to pursue hospice care, reportedly does not prefer hospice facility. - grave prognosis overall, discussed with family member Deanna Jacobson (son) and patient.  #SVC syndrome/Mediastinal adenopathy - undergoing palliative radiation   * SOB (shortness of breath) Attribute to ct finding of suspected lung cancer and postobstructive PNA. With elevated procalcitonin. Supplemental oxygen and duoneb therapy   Acute renal failure (ARF) (HCC) Resolved   Iron deficiency anemia 8-9, microcytic, ACD - Oncology following   Reflux esophagitis IV ppi.    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 Pt stopped smoking in 2019.      Hyponatremia Due to SIADH, most likely.  We will cont  With fluid restriction . Salt tablets.    Chronic pain syndrome: Patient on Flexeril and gabapentin and Celebrex.  Continued.   Patient is using low-dose Xanax and morphine that is helping her relieve anxiety and pain.    Essential hypertension: Blood pressures stable on amlodipine.  Occasionally using hydralazine.  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  Stable.  DVT prophylaxis: heparin injection 5,000 Units Start: 12/12/21 2200  SCDs   Code Status: DNR Family Communication: Son in the hallway Disposition Plan: Status is: Inpatient Remains inpatient appropriate because: Still with significant symptoms, now with difficulty eating. Palliative and hospice discussion ongoing.  Consultants:  Oncology Pulmonary Radiation oncology Palliative care  Procedures:  Bronch and biopsy  Antimicrobials:  Rocephin, 7/7--- 7/13   Subjective:  Patient seen and examined.  Anxious.  Difficulty eating regular food, frequent cough and  audible wheezing.  Objective: Vitals:   12/13/21 2133 12/14/21 0528 12/14/21 0801 12/14/21 1016  BP: 136/85 119/66 120/71   Pulse: 82 71 78   Resp: 18 18 18    Temp: 97.9 F (36.6 C) 97.9 F (36.6 C) 98 F (36.7 C)   TempSrc:      SpO2: 96% 99% 99%   Weight:    44 kg  Height:        Intake/Output Summary (Last 24 hours) at 12/14/2021 1039 Last data filed at 12/13/2021 1700 Gross per 24 hour  Intake 240 ml  Output --  Net 240 ml     Filed Weights   12/09/21 0333 12/11/21 0256 12/14/21 1016  Weight: 46.2 kg 47 kg 44 kg   Examination:  Audibly wheezing and airway noise.  On room air.  Anxious. Abdomen soft nontender. Extremities without swelling, edema or cyanosis.  Data Reviewed: I have personally reviewed following labs and imaging studies  CBC: Recent Labs  Lab 12/08/21 0523  WBC 14.7*  HGB 8.1*  HCT 26.8*  MCV 74.2*  PLT 412*    Basic Metabolic Panel: No results for input(s): "NA", "K", "CL", "CO2", "GLUCOSE", "BUN", "CREATININE", "CALCIUM", "MG", "PHOS" in the last 168 hours.  GFR: Estimated Creatinine Clearance (by C-G formula based on SCr of 0.71 mg/dL) Female: 47.4 mL/min Female: 55.8 mL/min Liver Function Tests: No results for input(s): "AST", "ALT", "ALKPHOS", "BILITOT", "PROT", "ALBUMIN" in the last 168 hours. No results for input(s): "LIPASE", "AMYLASE" in the last 168 hours. No results for input(s): "AMMONIA" in the last 168 hours. Coagulation Profile: No results for input(s): "INR", "PROTIME" in the last 168 hours. Cardiac Enzymes: No results for input(s): "CKTOTAL", "CKMB", "CKMBINDEX", "TROPONINI" in the last 168 hours. BNP (last 3 results) No results for input(s): "PROBNP" in the last 8760 hours. HbA1C: No results for input(s): "HGBA1C" in the last 72 hours. CBG: No results for input(s): "GLUCAP" in the last 168 hours. Lipid Profile: No results for input(s): "CHOL", "HDL", "LDLCALC", "TRIG", "CHOLHDL", "LDLDIRECT" in the last 72  hours. Thyroid Function Tests: No results for input(s): "TSH", "T4TOTAL", "FREET4", "T3FREE", "THYROIDAB" in the last 72 hours. Anemia Panel: No results for input(s): "VITAMINB12", "FOLATE", "FERRITIN", "TIBC", "IRON", "RETICCTPCT" in the last 72 hours.  Sepsis Labs: No results for input(s): "PROCALCITON", "LATICACIDVEN" in the last 168 hours.   No results found for this or any previous visit (from the past 240 hour(s)).    Radiology Studies: Reviewed from radiology reports.  Scheduled Meds:  arformoterol  15 mcg Nebulization BID   budesonide (PULMICORT) nebulizer solution  0.25 mg Nebulization BID   docusate sodium  100 mg Oral BID   escitalopram  10 mg Oral Daily   feeding supplement  1 Container Oral TID BM   gabapentin  800 mg Oral Q6H   heparin  5,000 Units Subcutaneous Q12H   lidocaine  1 patch Transdermal Q24H   methylPREDNISolone (SOLU-MEDROL) injection  80 mg Intravenous Q24H   multivitamin with minerals  1 tablet Oral Daily   nystatin  5 mL Mouth/Throat QID   revefenacin  175 mcg Nebulization Daily   sodium chloride flush  3 mL Intravenous Q12H   traZODone  100 mg Oral QHS   Continuous Infusions:   LOS: 11 days   Time spent: 35 minutes  Vanna Scotland, MD [facility] Pager [phone]

## 2021-12-14 NOTE — Care Management Important Message (Signed)
Important Message  Patient Details  Name: Deanna Jacobson MRN: [id number] Date of Birth: Aug 14, 1955   Medicare Important Message Given:  Yes     Dannette Barbara 12/14/2021, 12:59 PM

## 2021-12-14 NOTE — Discharge Summary (Signed)
Physician Discharge Summary   Patient: Deanna Jacobson MRN: [id number] DOB: April 24, 1956  Admit date:     12/03/2021  Discharge date: 12/14/21  Discharge Physician: Vanna Scotland   PCP: Donnie Coffin, MD     Discharge Diagnoses: Principal Problem:   SOB (shortness of breath) Active Problems:   Acute renal failure (ARF) (HCC)   Iron deficiency anemia   Reflux esophagitis   Disorder of skeletal system   Hyponatremia   Palliative care encounter   Malnutrition of moderate degree   Malignant neoplasm of lung ([place])  Resolved Problems:   * No resolved hospital problems. *  Hospital Course: 66 year old with history of hypertension, previous smoker, recently treated with extended course of antibiotics for pneumonia had a routine CT scan of the chest by primary care physician's office and found to have extensive malignancy and sent to ER.  Patient with generalized symptoms of malaise, fatigue, cough and shortness of breath.  Admitted for symptomatic treatment, diagnosis.  Oncology consultation. Remains in the hospital for symptom control. Bronchoscopy and biopsy, consistent with small cell lung cancer. MRI brain with multiple metastatic lesions. Patient remains extremely anxious, worsening respiratory symptoms including wheezing.  Undergoing inpatient radiation treatment. Remains in the hospital to control symptoms, plans unknown, however clinically home with hospice looks appropriate.  Followed by palliative and oncology. 7/14- Dr. Rogue Bussing d/w patient Regis Bill re: code status, ongoing tx, hospice consideration, GOCs. She elected for DNR/DNI. Pursuing hospice at home.  7/15- requested hospice evaluation, patient declined due to family members in room, hospice to re-eval 7/16 7/16- patient declined hospice care, and would like to pursue radiation 7/17- after tolerating radiation therapy, patient to dc home with family. 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 With elevated procalcitonin. Supplemental oxygen and duoneb therapy   Acute renal failure (ARF) (HCC) Resolved  Iron deficiency anemia 8-9, microcytic, ACD - Oncology following  Reflux esophagitis IV ppi.   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 Pt stopped smoking in 2019.    Hyponatremia Due to SIADH, most likely.  We will cont  With fluid restriction . Salt tablets.       Procedures performed: biopsy Disposition: hospice at home Diet recommendation:  Discharge Diet Orders (From admission, onward)     Start     Ordered   12/13/21 0000  Diet - low sodium heart healthy        12/13/21 0746            DISCHARGE MEDICATION: Allergies as of 12/14/2021       Reactions   Codeine Nausea And Vomiting        Medication List     TAKE these medications    Acetaminophen 8 Hour 650 MG CR tablet Generic drug: acetaminophen Take 1-2 tablets by mouth every 8 (eight) hours as needed.   albuterol 108 (90 Base) MCG/ACT inhaler Commonly known as: VENTOLIN HFA Inhale 1 puff into the lungs every 4 (four) hours as needed.   amLODipine 2.5 MG tablet Commonly known as: NORVASC Take 5 mg by mouth.   amLODipine 10 MG tablet Commonly known as: NORVASC Take 10 mg by mouth daily.   celecoxib 100 MG capsule Commonly known as: CELEBREX Take 1 capsule (100 mg  total) by mouth 2 (two) times daily.   cyclobenzaprine 10 MG tablet Commonly known as: FLEXERIL Take 1 tablet (10 mg total) by mouth 3 (three) times daily as needed for muscle spasms.   escitalopram 20 MG tablet Commonly known as: LEXAPRO Take 20 mg by mouth daily.   gabapentin 600 MG tablet Commonly known as:  NEURONTIN Take 1 tablet (600 mg total) by mouth 3 (three) times daily.   gabapentin 800 MG tablet Commonly known as: NEURONTIN Take 800 mg by mouth 4 (four) times daily.   lisinopril 20 MG tablet Commonly known as: ZESTRIL Take 1 tablet (20 mg total) by mouth daily.   nabumetone 500 MG tablet Commonly known as: RELAFEN Take 500 mg by mouth 2 (two) times daily.   omeprazole 20 MG capsule Commonly known as: PRILOSEC Take 1 capsule (20 mg total) by mouth 2 (two) times daily for 14 days.   traZODone 100 MG tablet Commonly known as: DESYREL Take 100 mg by mouth at bedtime.               Durable Medical Equipment  (From admission, onward)           Start     Ordered   12/13/21 1433  DME Oxygen  Once       Comments: continuous and transport needs  Question Answer Comment  Length of Need 6 Months   Mode or (Route) Nasal cannula   Frequency Continuous (stationary and portable oxygen unit needed)   Oxygen conserving device Yes   Oxygen delivery system Gas      12/13/21 1432            Discharge Exam: Filed Weights   12/08/21 0500 12/09/21 0333 12/11/21 0256  Weight: 49.7 kg 46.2 kg 47 kg    Audibly wheezing and airway noise.  On room air.  Anxious. Abdomen soft nontender. Extremities without swelling, edema or cyanosis.  Condition at discharge: stable  The results of significant diagnostics from this hospitalization (including imaging, microbiology, ancillary and laboratory) are listed below for reference.   Imaging Studies: ECHOCARDIOGRAM LIMITED  Result Date: 12/09/2021    ECHOCARDIOGRAM LIMITED REPORT   Patient Name:   Deanna Jacobson Date of Exam: 12/09/2021 Medical Rec #:  [id number]         Height:       61.0 in Accession #:    [id number]        Weight:       101.9 lb Date of Birth:  09-27-1955        BSA:          1.418 m Patient Age:    28 years          BP:           144/87 mmHg Patient Gender: F                 HR:           86 bpm. Exam  Location:  [facility] Procedure: 2D Echo, Cardiac Doppler and Color Doppler Indications:     Aortic valve stenosis I35.9  History:         Patient has prior history of Echocardiogram examinations, most                  recent 12/04/2021.  Sonographer:     Sherrie Sport Referring Phys:  [id number] [facility] AMERY Diagnosing Phys: Kate Sable MD  Sonographer Comments: Technically challenging study due to limited acoustic windows,  no apical window and suboptimal subcostal window. Image acquisition challenging due to COPD and Image acquisition challenging due to patient body habitus. IMPRESSIONS  1. Left ventricular ejection fraction, by estimation, is 60 to 65%. The left ventricle has normal function.  2. The mitral valve is normal in structure. No evidence of mitral valve regurgitation.  3. Aortic valve hemodynamics /gradient not obtained likely due to body habitus. Unable to fully assess aortic valve calcifications or stenosis.. The aortic valve is calcified. FINDINGS  Left Ventricle: Left ventricular ejection fraction, by estimation, is 60 to 65%. The left ventricle has normal function. Pericardium: Trivial pericardial effusion is present. Mitral Valve: The mitral valve is normal in structure. Aortic Valve: Aortic valve hemodynamics /gradient not obtained likely due to body habitus. Unable to fully assess aortic valve calcifications or stenosis. The aortic valve is calcified. Aorta: The aortic root is normal in size and structure. LEFT VENTRICLE PLAX 2D LVIDd:         3.50 cm LVIDs:         2.30 cm LV PW:         1.10 cm LV IVS:        1.30 cm  LEFT ATRIUM         Index LA diam:    3.80 cm 2.68 cm/m   AORTA Ao Root diam: 2.80 cm TRICUSPID VALVE TR Peak grad:   38.7 mmHg TR Vmax:        311.00 cm/s Kate Sable MD Electronically signed by Kate Sable MD Signature Date/Time: 12/09/2021/2:48:30 PM    Final    MR BRAIN W WO CONTRAST  Result Date: 12/09/2021 CLINICAL DATA:  Initial evaluation for history of non-small  cell lung cancer, evaluate for metastatic disease. EXAM: MRI HEAD WITHOUT AND WITH CONTRAST TECHNIQUE: Multiplanar, multiecho pulse sequences of the brain and surrounding structures were obtained without and with intravenous contrast. CONTRAST:  78mL GADAVIST GADOBUTROL 1 MMOL/ML IV SOLN COMPARISON:  Prior head CT from 11/27/2007. FINDINGS: Brain: Cerebral volume within normal limits. Patchy T2/FLAIR hyperintensity involving the periventricular and deep white matter both cerebral hemispheres most consistent with chronic small vessel ischemic disease, mild to moderate in nature. No evidence for acute or subacute infarct. No areas of chronic cortical infarction. No acute or chronic intracranial blood products. Multiple scattered cystic masses are seen involving both cerebral hemispheres as well as the cerebellum, consistent with metastatic disease. There are at least 8 lesions in total. For reference purposes, the largest lesion involving the left cerebral hemisphere is positioned at the left parieto-occipital region and measures 3.3 x 2.3 x 2.5 cm (series 18, image 101). The largest lesion within the right cerebral hemisphere involves the peripheral right frontal lobe in measures 1.5 x 1.6 x 1.3 cm (series 18, image 129). Largest cerebellar lesion seen within the peripheral right cerebellum and measures 1.3 x 2.0 x 1.7 cm (series 18, image 35). These lesions are predominantly cystic in nature with peripheral postcontrast enhancement. No significant surrounding vasogenic edema or regional mass effect. No midline shift. Basilar cisterns remain patent. Ventricles normal size without hydrocephalus. No extra-axial fluid collection. Pituitary gland and suprasellar region within normal limits. Vascular: Major intracranial vascular flow voids are maintained. Skull and upper cervical spine: Craniocervical junction within normal limits. Bone marrow signal intensity normal. No focal marrow replacing lesion. No scalp soft tissue  abnormality. Sinuses/Orbits: Globes orbital soft tissues within normal limits. Paranasal sinuses are largely clear. Small left mastoid effusion noted, of doubtful significance. Other: None. IMPRESSION: 1.  Multiple scattered cystic masses involving both cerebral hemispheres and cerebellum, consistent with intracranial metastatic disease. No significant surrounding vasogenic edema or regional mass effect. 2. No other acute intracranial abnormality. 3. Underlying mild-to-moderate chronic microvascular ischemic disease. Electronically Signed   By: Jeannine Boga M.D.   On: 12/09/2021 04:15   CT ABDOMEN PELVIS W CONTRAST  Result Date: 12/08/2021 CLINICAL DATA:  Small cell lung cancer, for staging EXAM: CT ABDOMEN AND PELVIS WITH CONTRAST TECHNIQUE: Multidetector CT imaging of the abdomen and pelvis was performed using the standard protocol following bolus administration of intravenous contrast. RADIATION DOSE REDUCTION: This exam was performed according to the departmental dose-optimization program which includes automated exposure control, adjustment of the mA and/or kV according to patient size and/or use of iterative reconstruction technique. CONTRAST:  132mL OMNIPAQUE IOHEXOL 300 MG/ML  SOLN COMPARISON:  Partial comparison to CT chest dated 12/02/2021 FINDINGS: Lower chest: Small left pleural effusion, progressive. Additional left lower lobe tumor is better described on recent CT. Hepatobiliary: Liver is within normal limits. No suspicious enhancing hepatic lesions. Layering tiny gallstones in the gallbladder fundus (series 2/image 29), without associated inflammatory changes. No intrahepatic or extrahepatic ductal dilatation. Pancreas: Within normal limits. Spleen: Within normal limits. Adrenals/Urinary Tract: Adrenal glands are within normal limits. Kidneys are within normal limits.  No hydronephrosis. Bladder is within normal limits. Stomach/Bowel: Stomach is within normal limits. No evidence of bowel  obstruction. Normal appendix (series 2/image 84). Moderate colonic stool burden, suggesting constipation. Vascular/Lymphatic: No evidence of abdominal aortic aneurysm. Atherosclerotic calcifications of the abdominal aorta and branch vessels. No suspicious abdominopelvic lymphadenopathy. Reproductive: Uterus is within normal limits. No adnexal masses. Other: No abdominopelvic ascites. Musculoskeletal: No focal osseous lesions. IMPRESSION: No findings suspicious for metastatic disease in the abdomen/pelvis. Small left pleural effusion, mildly progressive. Electronically Signed   By: Julian Hy M.D.   On: 12/08/2021 22:28   ECHOCARDIOGRAM COMPLETE  Result Date: 12/08/2021    ECHOCARDIOGRAM REPORT   Patient Name:   Deanna Jacobson Date of Exam: 12/04/2021 Medical Rec #:  [id number]         Height:       61.0 in Accession #:    [id number]        Weight:       101.2 lb Date of Birth:  [birth year]        BSA:          1.414 m Patient Age:    81 years          BP:           151/62 mmHg Patient Gender: F                 HR:           94 bpm. Exam Location:  [facility] Procedure: 2D Echo, Cardiac Doppler and Color Doppler Indications:     Pericardial Effusion I31.3  History:         Patient has no prior history of Echocardiogram examinations.                  Risk Factors:Hypertension.  Sonographer:     Sherrie Sport Referring Phys:  [id number] CARMEN L GONZALEZ Diagnosing Phys: Ida Rogue MD  Sonographer Comments: Technically challenging study due to limited acoustic windows, no apical window and no subcostal window. IMPRESSIONS  1. Left ventricular ejection fraction, by estimation, is 60 to 65%. The left ventricle has normal function. The left ventricle has no regional wall motion abnormalities. There is  mild left ventricular hypertrophy. Left ventricular diastolic parameters are indeterminate.  2. Right ventricular systolic function is normal. The right ventricular size is normal. Tricuspid regurgitation signal is  inadequate for assessing PA pressure.  3. A small pericardial effusion is present. There is no evidence of cardiac tamponade.  4. The mitral valve is normal in structure. No evidence of mitral valve regurgitation. No evidence of mitral stenosis.  5. The aortic valve is normal in structure. There is moderate calcification of the aortic valve. Aortic valve regurgitation is not visualized. Aortic valve sclerosis/calcification is present, unable to exclude stenosis as gradient not obtained.  6. The inferior vena cava is normal in size with greater than 50% respiratory variability, suggesting right atrial pressure of 3 mmHg. FINDINGS  Left Ventricle: Left ventricular ejection fraction, by estimation, is 60 to 65%. The left ventricle has normal function. The left ventricle has no regional wall motion abnormalities. The left ventricular internal cavity size was normal in size. There is  mild left ventricular hypertrophy. Left ventricular diastolic parameters are indeterminate. Right Ventricle: The right ventricular size is normal. No increase in right ventricular wall thickness. Right ventricular systolic function is normal. Tricuspid regurgitation signal is inadequate for assessing PA pressure. Left Atrium: Left atrial size was normal in size. Right Atrium: Right atrial size was normal in size. Pericardium: A small pericardial effusion is present. There is no evidence of cardiac tamponade. Mitral Valve: The mitral valve is normal in structure. No evidence of mitral valve regurgitation. No evidence of mitral valve stenosis. Tricuspid Valve: The tricuspid valve is normal in structure. Tricuspid valve regurgitation is not demonstrated. No evidence of tricuspid stenosis. Aortic Valve: The aortic valve is normal in structure. There is moderate calcification of the aortic valve. Aortic valve regurgitation is not visualized. Aortic valve sclerosis/calcification is present, without any evidence of aortic stenosis. Pulmonic Valve:  The pulmonic valve was normal in structure. Pulmonic valve regurgitation is not visualized. No evidence of pulmonic stenosis. Aorta: The aortic root is normal in size and structure. Venous: The inferior vena cava is normal in size with greater than 50% respiratory variability, suggesting right atrial pressure of 3 mmHg. IAS/Shunts: No atrial level shunt detected by color flow Doppler.  LEFT VENTRICLE PLAX 2D LVIDd:         3.70 cm LVIDs:         1.50 cm LV PW:         1.20 cm LV IVS:        1.30 cm LVOT diam:     2.00 cm LVOT Area:     3.14 cm  LEFT ATRIUM         Index LA diam:    3.70 cm 2.62 cm/m   AORTA Ao Root diam: 2.77 cm  SHUNTS Systemic Diam: 2.00 cm Ida Rogue MD Electronically signed by Ida Rogue MD Signature Date/Time: 12/04/2021/5:15:56 PM    Final (Updated)    DG Chest Port 1 View  Result Date: 12/07/2021 CLINICAL DATA:  Status post bronchoscopy, shortness of breath EXAM: PORTABLE CHEST 1 VIEW COMPARISON:  12/03/2021 FINDINGS: Bilateral interstitial thickening. Right upper lobe airspace disease peripherally. Faint right upper lobe airspace opacity. Lingular and left lower lobe airspace disease. No pleural effusion or pneumothorax. Mediastinal lymphadenopathy. Stable cardiomegaly. No acute osseous abnormality. IMPRESSION: 1. Bilateral patchy airspace disease and interstitial thickening unchanged from the CT of the chest dated 12/02/2021. 2. Persistent mediastinal lymphadenopathy. 3. No pneumothorax. Electronically Signed   By: [place] Service.D.  On: 12/07/2021 14:38   DG Chest 2 View  Result Date: 12/03/2021 CLINICAL DATA:  Shortness of breath. EXAM: CHEST - 2 VIEW COMPARISON:  Radiograph November 23, 2021 and chest CT December 02, 2021 FINDINGS: Normal size heart. Aortic atherosclerosis. Similar widening of the upper mediastinum previously characterized as an upper mediastinal mass on CT December 02, 2021. Similar multifocal pulmonary opacities. No pleural effusion or pneumothorax. The visualized  skeletal structures are unchanged. IMPRESSION: Similar multifocal pulmonary opacities and mediastinal widening reflecting the large mediastinal mass seen on previous day CT. No new focal areas of consolidation or overt pulmonary edema. Electronically Signed   By: Dahlia Bailiff M.D.   On: 12/03/2021 16:30   CT CHEST W CONTRAST  Result Date: 12/03/2021 CLINICAL DATA:  Chronic cough for 1 month. EXAM: CT CHEST WITH CONTRAST TECHNIQUE: Multidetector CT imaging of the chest was performed during intravenous contrast administration. RADIATION DOSE REDUCTION: This exam was performed according to the departmental dose-optimization program which includes automated exposure control, adjustment of the mA and/or kV according to patient size and/or use of iterative reconstruction technique.  body onc  CONTRAST:  74mL OMNIPAQUE IOHEXOL 300 MG/ML  SOLN COMPARISON:  None Available. FINDINGS: Cardiovascular: Mild cardiac enlargement. Small to moderate pericardial effusion is identified, which measures 1.4 cm in thickness over the right atrium. Aortic atherosclerosis. Coronary artery calcifications. Mediastinum/Nodes: Thyroid gland appears normal. Large mediastinal mass is identified which encases/narrows the trachea and bilateral mainstem bronchi as well as the esophagus. This measures 9.1 by 9.1 x 6.3 cm. There is marked mass effect and possible invasion of the superior vena cava which is significantly narrowed. There is encasement with narrowing of the main pulmonary arteries bilaterally. Mass effect and possible invasion of the left atrium is also noted. Encasement and narrowing with possible invasion of bilateral superior pulmonary veins noted. There is obstruction of the esophagus just above the level of the carina with postobstructive dilatation of the proximal esophagus. Wall thickening of the distal esophagus is noted with esophageal varices identified, image 93/2. Lungs/Pleura: No pleural effusion identified. Tumor  within the perihilar left lower lobe is identified which invades the left inferior pulmonary vein measures 3 x 3.2 cm, image 70/2. Postobstructive atelectasis and endobronchial spread of tumor is identified extending into the periphery of the left lower lobe. Within the posterior right upper lobe there is a subpleural soft tissue mass which extends medially along the major fissure measures 3.2 x 2.1 cm, image 33/3. Patchy areas of subsegmental atelectasis and ground-glass attenuation is noted within both upper lung zones which likely reflects postobstructive pneumonitis. Upper Abdomen: No acute abnormality.  Small hiatal hernia noted. Musculoskeletal: No chest wall abnormality. No acute or significant osseous findings. IMPRESSION: 1. 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 There is marked mass effect and possible invasion of the superior vena cava which is significantly narrowed. There is encasement with narrowing of the main pulmonary arteries bilaterally. There is also encasement and narrowing of the bilateral superior pulmonary veins. Obstruction of the midthoracic esophagus is also noted. Tumor involvement of the pulmonary veins and left atrium is also noted. 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 Differential considerations include esophageal neoplasm or lymphoproliferative disorder. 2. Perihilar left lung mass is noted with endobronchial spread and postobstructive changes noted. This also invades the inferior left pulmonary vein. 3. Additional soft tissue mass within the posterior right upper lobe is identified which extends medially along the major fissure 4. Postobstructive atelectasis  and endobronchial spread of tumor is identified extending into the periphery of the left lower lobe. 5. Small to moderate pericardial effusion. 6. Coronary artery calcifications. 7. Small hiatal hernia. 8. Aortic Atherosclerosis  (ICD10-I70.0). Electronically Signed   By: Kerby Moors M.D.   On: 12/03/2021 06:15   DG Chest 2 View  Result Date: 11/23/2021 CLINICAL DATA:  Dry cough, shortness of breath, symptoms for 1-2 months, heart murmur, former smoker, hypertension EXAM: CHEST - 2 VIEW COMPARISON:  11/27/2007 FINDINGS: Upper normal size of cardiac silhouette. Atherosclerotic calcification aorta. Abnormal RIGHT paratracheal border, question adenopathy versus mass. Bronchitic changes and hyperinflation with interstitial infiltrates in the mid to lower lungs bilaterally. No pleural effusion or pneumothorax. Bones demineralized. IMPRESSION: Bronchitic changes with infiltrates in the mid to lower lungs bilaterally question pneumonia. RIGHT paratracheal mass versus adenopathy; CT chest with contrast recommended to exclude mass/adenopathy. Aortic Atherosclerosis (ICD10-I70.0). These results will be called to the ordering clinician or representative by the Radiologist Assistant, and communication documented in the PACS or [employer]. Electronically Signed   By: Lavonia Dana M.D.   On: 11/23/2021 16:11    Microbiology: Results for orders placed or performed during the hospital encounter of 12/03/21  SARS Coronavirus 2 by RT PCR (hospital order, performed in [facility] hospital lab) *cepheid single result test* Anterior Nasal Swab     Status: None   Collection Time: 12/03/21  8:05 PM   Specimen: Anterior Nasal Swab  Result Value Ref Range Status   SARS Coronavirus 2 by RT PCR NEGATIVE NEGATIVE Final    Comment: (NOTE) SARS-CoV-2 target nucleic acids are NOT DETECTED.  The SARS-CoV-2 RNA is generally detectable in upper and lower respiratory specimens during the acute phase of infection. The lowest concentration of SARS-CoV-2 viral copies this assay can detect is 250 copies / mL. A negative result does not preclude SARS-CoV-2 infection and should not be used as the sole basis for treatment or other patient management  decisions.  A negative result may occur with improper specimen collection / handling, submission of specimen other than nasopharyngeal swab, presence of viral mutation(s) within the areas targeted by this assay, and inadequate number of viral copies (<250 copies / mL). A negative result must be combined with clinical observations, patient history, and epidemiological information.  Fact Sheet for Patients:   https://www.patel.info/  Fact Sheet for Healthcare Providers: https://hall.com/  This test is not yet approved or  cleared by the [place] FDA and has been authorized for detection and/or diagnosis of SARS-CoV-2 by FDA under an Emergency Use Authorization (EUA).  This EUA will remain in effect (meaning this test can be used) for the duration of the COVID-19 declaration under Section 564(b)(1) of the Act, 21 U.S.C. section 360bbb-3(b)(1), unless the authorization is terminated or revoked sooner.  Performed at [facility], [address] [place], [place] [address]     Labs: CBC: Recent Labs  Lab 12/08/21 0523  WBC 14.7*  HGB 8.1*  HCT 26.8*  MCV 74.2*  PLT 412*    Basic Metabolic Panel: No results for input(s): "NA", "K", "CL", "CO2", "GLUCOSE", "BUN", "CREATININE", "CALCIUM", "MG", "PHOS" in the last 168 hours. Liver Function Tests: No results for input(s): "AST", "ALT", "ALKPHOS", "BILITOT", "PROT", "ALBUMIN" in the last 168 hours. CBG: No results for input(s): "GLUCAP" in the last 168 hours.  Discharge time spent: less than 30 minutes.  Signed: Vanna Scotland, MD [facility] 12/14/2021

## 2021-12-14 NOTE — Progress Notes (Signed)
[facility]) Hospital Liaison Note   Received phone call from patient's son stating that patient has now decided to discontinue any further radiation treatment. They would like to discuss hospice at home.  Spoke with [employer], who feels patient would likely be best served at [facility]. Plan for hospital liaison and Josh to meet at bedside tomorrow.   Phoned son back to let him know we are holding off on placing referral or DME order today to have continued discussion tomorrow about appropriate setting for hospice. Son states he feels she would be best served at [facility] as well, though his mom would really like to go home, even for just a short while.   Son very appreciative that further conversations will be followed up tomorrow.  Please call with any questions.  Thank you, Margaretmary Eddy, BSN, RN [facility] Liaison [phone]

## 2021-12-14 NOTE — Progress Notes (Signed)
[facility]) Hospital Liaison note:  This patient has been referred to [facility]patient [facility]iative Care services. Will continue to follow for disposition.  Please call with any outpatient palliative questions or concerns.  Thank you for the opportunity to participate in this patient's care.   Thank you, Lorelee Market, LPN [facility] Liaison [phone]

## 2021-12-15 ENCOUNTER — Ambulatory Visit: Payer: Medicare Other

## 2021-12-15 DIAGNOSIS — R0602 Shortness of breath: Secondary | ICD-10-CM | POA: Diagnosis not present

## 2021-12-15 DIAGNOSIS — C3490 Malignant neoplasm of unspecified part of unspecified bronchus or lung: Secondary | ICD-10-CM | POA: Diagnosis not present

## 2021-12-15 DIAGNOSIS — Z515 Encounter for palliative care: Secondary | ICD-10-CM | POA: Diagnosis not present

## 2021-12-15 NOTE — Progress Notes (Signed)
Progress Note    Deanna Jacobson   JAS:[id number]  DOB: 1956-04-14  DOA: 12/03/2021     12 PCP: Donnie Coffin, MD  Initial CC: abnormal scan  Hospital Course: Deanna Jacobson is a 66 year old with history of hypertension, previous smoker, recently treated with extended course of antibiotics for pneumonia had a routine CT scan of the chest by primary care physician's office and found to have extensive malignancy and sent to ER.  Patient with generalized symptoms of malaise, fatigue, cough and shortness of breath.  Admitted for symptomatic treatment, diagnosis.  Oncology consultation. Bronchoscopy and biopsy, consistent with small cell lung cancer. MRI brain with multiple metastatic lesions. She continued hospitalization for symptom control and also inpatient radiation treatment. She was followed closely by palliative care and oncology. Ongoing [place] discussions were held and ultimately patient and family decided to forego further treatments and pursue comfort care/hospice.  Interval History:  No events overnight.  Her son, Deanna Jacobson, and her significant other are bedside this morning. They have discussed further plans with hospice and plan will now be for discharging to hospice house on Wednesday. Patient endorses some back pain and ongoing shortness of breath; overall, symptoms have improved with treatment.  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  Now pursuing comfort care   SOB (shortness of breath) - continue comfort care   Acute renal failure (ARF) (HCC) Resolved   Iron deficiency anemia 8-9, microcytic, ACD   Reflux esophagitis PPI   Disorder of skeletal system Pt has chronic  severe arthritis. Pt stopped smoking in 2019.    Hyponatremia Due to SIADH, most likely.  - plan now comfort care   Chronic pain syndrome:  Patient on Flexeril and gabapentin and Celebrex. Patient is using low-dose Xanax and morphine that is helping her relieve anxiety and pain.    Essential hypertension: Blood pressures stable on amlodipine.  Occasionally using hydralazine.  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  Stable.   Old records reviewed in assessment of this patient  Antimicrobials:   DVT prophylaxis:  heparin injection 5,000 Units Start: 12/12/21 2200   Code Status:   Code Status: DNR  Mobility Assessment (last 72 hours)     Mobility Assessment     Row Name 12/14/21 2000 12/14/21 1800 12/13/21 1934 12/12/21 1915     Does patient have an order for bedrest or is patient medically unstable No - Continue assessment No - Continue assessment No - Continue assessment No - Continue assessment    What is the highest level of mobility based on the progressive mobility assessment? Level 6 (Walks independently in room and hall) - Balance while walking in room without assist - Complete Level 6 (Walks independently in room and hall) - Balance while walking in room without assist - Complete Level 6 (Walks independently in room and hall) - Balance while walking in room without assist - Complete Level 6 (Walks independently in room and hall) - Balance while walking in room without assist - Complete             Disposition Plan:  Hospice house - Wednesday Status is: Inpt  Objective: Blood pressure (!) 157/66, pulse 87, temperature (!) 97.4 F (36.3 C), resp. rate 18, height 5\' 1"  (1.549 m), weight 44.3  kg, SpO2 90 %.  Examination:  Physical Exam Constitutional:      General: She is not in acute distress.    Appearance: Normal appearance.  HENT:     Head: Normocephalic and atraumatic.     Mouth/Throat:     Mouth: Mucous membranes are moist.  Eyes:      Extraocular Movements: Extraocular movements intact.  Cardiovascular:     Rate and Rhythm: Normal rate and regular rhythm.     Heart sounds: Normal heart sounds.  Pulmonary:     Effort: Pulmonary effort is normal. No respiratory distress.     Breath sounds: No wheezing.     Comments: Diffuse coarse breath sounds bilaterally  Abdominal:     General: Bowel sounds are normal. There is no distension.     Palpations: Abdomen is soft.     Tenderness: There is no abdominal tenderness.  Musculoskeletal:        General: Normal range of motion.     Cervical back: Normal range of motion and neck supple.  Skin:    General: Skin is warm and dry.  Neurological:     General: No focal deficit present.     Mental Status: She is alert.  Psychiatric:        Mood and Affect: Mood normal.        Behavior: Behavior normal.      Consultants:  Oncology Palliative care  Procedures:    Data Reviewed: No results found for this or any previous visit (from the past 24 hour(s)).  I have Reviewed nursing notes, Vitals, and Lab results since pt's last encounter. Pertinent lab results : see above I have reviewed the last note from staff over past 24 hours I have discussed pt's care plan and test results with nursing staff, case manager   LOS: 12 days   Dwyane Dee, MD [facility] 12/15/2021, 2:12 PM

## 2021-12-15 NOTE — TOC Progression Note (Signed)
Transition of Care [facility]) - Progression Note    Patient Details  Name: Latrise Bowland MRN: [id number] Date of Birth: 05/21/1956  Transition of Care [facility]) CM/SW [place], RN Phone Number: 12/15/2021, 12:54 PM  Clinical Narrative:   Plans are  for patient to return home with hospice services through [facility].  Michae Kava has seen the patient and is currently working on setting up [employer]    Expected Discharge Plan: Home/Self Care Barriers to Discharge: Continued Medical Work up  Expected Discharge Plan and Services Expected Discharge Plan: Home/Self Care In-house Referral: Chaplain Discharge Planning Services: CM Consult   Living arrangements for the past 2 months: Single Family Home Expected Discharge Date: 12/14/21                                     Social Determinants of Health (SDOH) Interventions    Readmission Risk Interventions    12/04/2021    3:23 PM  Readmission Risk Prevention Plan  Transportation Screening Complete  PCP or Specialist Appt within 5-7 Days Complete  Home Care Screening Complete  Medication Review (RN CM) Complete

## 2021-12-15 NOTE — Progress Notes (Signed)
Rebeckah Wilsey   DOB:May 24, 1956   GY#:[id number]    Subjective: Patient continues to have shortness of breath/cough.  Complains of choking with food/liquids.  No hemoptysis.    Accompanied by her son.  Given the lack of significant benefit from radiation-radiation has been discontinued.   Objective:  Vitals:   12/15/21 0740 12/15/21 0819  BP:  (!) 157/66  Pulse: 87 87  Resp: 18 18  Temp:  (!) 97.4 F (36.3 C)  SpO2: 98% 90%     Intake/Output Summary (Last 24 hours) at 12/15/2021 2010 Last data filed at 12/15/2021 1300 Gross per 24 hour  Intake 240 ml  Output --  Net 240 ml     Physical Exam Vitals and nursing note reviewed.  HENT:     Head: Normocephalic and atraumatic.     Mouth/Throat:     Pharynx: Oropharynx is clear.  Eyes:     Extraocular Movements: Extraocular movements intact.     Pupils: Pupils are equal, round, and reactive to light.  Cardiovascular:     Rate and Rhythm: Normal rate and regular rhythm.  Pulmonary:     Comments: Decreased breath sounds bilaterally.  Diffuse coarse breath sounds/wheezing. Abdominal:     Palpations: Abdomen is soft.  Musculoskeletal:        General: Normal range of motion.     Cervical back: Normal range of motion.  Skin:    General: Skin is warm.  Neurological:     General: No focal deficit present.     Mental Status: She is alert and oriented to person, place, and time.  Psychiatric:        Behavior: Behavior normal.        Judgment: Judgment normal.      Labs:  Lab Results  Component Value Date   WBC 14.7 (H) 12/08/2021   HGB 8.1 (L) 12/08/2021   HCT 26.8 (L) 12/08/2021   MCV 74.2 (L) 12/08/2021   PLT 412 (H) 12/08/2021   NEUTROABS 4.2 12/23/2016    Lab Results  Component Value Date   NA 131 (L) 12/05/2021   K 4.1 12/05/2021   CL 95 (L) 12/05/2021   CO2 27 12/05/2021    Studies:  No results found.  # # 66 y.o.  adult with multiple medical problems including smoking/COPD/ currently admitted to  hospital for worsening shortness of breath/cough.  CT scan shows mediastinal mass/lymphadenopathy encasing the vascular structures; trachea/esophagus; invading left atrium; left upper lobe mass.   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  No significant improvement noted see below   # # Anxiety-situational/secondary to underlying diagnosis.  S/p evaluation with Josh Borders.   #Choking symptoms: ?  Likely secondary to malignancy.  Continue supportive care.  #CODE STATUS: DNR/DNI  #Plan of care: Had a long schedule patient and son regarding overall futility of ongoing treatment/radiation given the lack of significant response noted so far.  After discussion with Dr. Donella Stade patient's son is in agreement with discontinuation of radiation.  After multiple discussions back-and-forth patient has eventually agreed to be admitted to hospice home for continued care.  Discussed with the son Clair Gulling in detail.  Also discussed with [employer].  Cammie Sickle, MD 12/15/2021  8:10 PM

## 2021-12-15 NOTE — Progress Notes (Signed)
Nutrition Brief Note  Chart reviewed. Pt now transitioning to comfort care.  No further nutrition interventions planned at this time.  Please re-consult as needed.   Arelis Neumeier W, RD, LDN, CDCES Registered Dietitian II Certified Diabetes Care and Education Specialist Please refer to AMION for RD and/or RD on-call/weekend/after hours pager   

## 2021-12-15 NOTE — Progress Notes (Signed)
[place] at [facility] [phone] [phone]   Name: Deanna Jacobson Date: 12/15/2021 MRN: [id number]  DOB: June 05, 1955  Patient Care Team: Donnie Coffin, MD as PCP - General (Family Medicine)    REASON FOR CONSULTATION: Deanna Jacobson is a 66 y.o. adult with multiple medical problems including history of tobacco abuse, chronic back pain, and hypertension who was admitted to the hospital on 12/03/2021 with worsening shortness of breath.  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  Patient has had severe anxiety and intractable cough.  Palliative care was consulted to address goals and manage ongoing symptoms..    CODE STATUS: DNR  PAST MEDICAL HISTORY: Past Medical History:  Diagnosis Date   Chronic back pain    Hypertension     PAST SURGICAL HISTORY:  Past Surgical History:  Procedure Laterality Date   BREAST ENHANCEMENT SURGERY     BREAST SURGERY     COLONOSCOPY WITH PROPOFOL N/A 02/15/2019   Procedure: COLONOSCOPY WITH PROPOFOL;  Surgeon: [name], MD;  Location: [facility] ENDOSCOPY;  Service: Endoscopy;  Laterality: N/A;   ESOPHAGOGASTRODUODENOSCOPY (EGD) WITH PROPOFOL N/A 02/15/2019   Procedure: ESOPHAGOGASTRODUODENOSCOPY (EGD) WITH PROPOFOL;  Surgeon: [name], MD;  Location: [facility] ENDOSCOPY;  Service: Endoscopy;  Laterality: N/A;   VIDEO BRONCHOSCOPY WITH ENDOBRONCHIAL ULTRASOUND N/A 12/07/2021   Procedure: VIDEO BRONCHOSCOPY WITH ENDOBRONCHIAL ULTRASOUND;  Surgeon: Tyler Pita, MD;  Location: [facility] ORS;  Service: Pulmonary;  Laterality: N/A;    HEMATOLOGY/ONCOLOGY HISTORY:  Oncology History   No history exists.    ALLERGIES:  is allergic to codeine.  MEDICATIONS:  Current  Facility-Administered Medications  Medication Dose Route Frequency Provider Last Rate Last Admin   acetaminophen (TYLENOL) tablet 650 mg  650 mg Oral Q6H PRN Para Skeans, MD       Or   acetaminophen (TYLENOL) suppository 650 mg  650 mg Rectal Q6H PRN Para Skeans, MD       albuterol (PROVENTIL) (2.5 MG/3ML) 0.083% nebulizer solution 2.5 mg  2.5 mg Nebulization Q2H PRN Florina Ou V, MD   2.5 mg at 12/11/21 1712   alum & mag hydroxide-simeth (MAALOX/MYLANTA) 200-200-20 MG/5ML suspension 30 mL  30 mL Oral Q8H PRN Vanna Scotland, MD       arformoterol (BROVANA) nebulizer solution 15 mcg  15 mcg Nebulization BID Tyler Pita, MD   15 mcg at 12/15/21 0740   benzonatate (TESSALON) capsule 100 mg  100 mg Oral TID PRN Barb Merino, MD   100 mg at 12/15/21 0842   bisacodyl (DULCOLAX) EC tablet 5 mg  5 mg Oral Daily PRN Para Skeans, MD   5 mg at 12/10/21 0923   budesonide (PULMICORT) nebulizer solution 0.25 mg  0.25 mg Nebulization BID Tyler Pita, MD   0.25 mg at 12/15/21 0740   chlorpheniramine-HYDROcodone 10-8 MG/5ML suspension 5 mL  5 mL Oral Q12H Refugia Laneve, Kirt Boys, NP   5 mL at 12/15/21 0835   cyclobenzaprine (FLEXERIL) tablet 10 mg  10 mg Oral TID PRN Barb Merino, MD   10 mg at 12/10/21 2138   diphenhydrAMINE (BENADRYL) 12.5 MG/5ML elixir 12.5 mg  12.5 mg Oral Q8H PRN Vanna Scotland, MD       docusate sodium (COLACE) capsule 100 mg  100 mg Oral BID  Para Skeans, MD   100 mg at 12/15/21 0835   escitalopram (LEXAPRO) tablet 10 mg  10 mg Oral Daily Anahit Klumb, Kirt Boys, NP   10 mg at 12/15/21 4174   feeding supplement (BOOST / RESOURCE BREEZE) liquid 1 Container  1 Container Oral TID BM Barb Merino, MD   1 Container at 12/13/21 0828   gabapentin (NEURONTIN) 250 MG/5ML solution 800 mg  800 mg Oral Q6H Kaul, Aseem, MD   800 mg at 12/15/21 1027   guaiFENesin (MUCINEX) 12 hr tablet 600 mg  600 mg Oral BID PRN Para Skeans, MD   600 mg at 12/11/21 1712   heparin injection 5,000 Units  5,000  Units Subcutaneous Q12H Vanna Scotland, MD   5,000 Units at 12/15/21 0814   hydrALAZINE (APRESOLINE) injection 10 mg  10 mg Intravenous Q4H PRN Barb Merino, MD   10 mg at 12/09/21 2153   hydrocortisone cream 1 %   Topical TID PRN Vanna Scotland, MD       lidocaine (LIDODERM) 5 % 1 patch  1 patch Transdermal Q24H Para Skeans, MD   1 patch at 12/14/21 2029   LORazepam (ATIVAN) tablet 0.5 mg  0.5 mg Oral Q6H PRN Shawniece Oyola, Kirt Boys, NP   0.5 mg at 12/14/21 2031   methylPREDNISolone sodium succinate (SOLU-MEDROL) 125 mg/2 mL injection 80 mg  80 mg Intravenous Q24H Cammie Sickle, MD   80 mg at 12/14/21 2032   morphine (PF) 2 MG/ML injection 1 mg  1 mg Intravenous Q4H PRN Vanna Scotland, MD   1 mg at 12/13/21 0825   morphine CONCENTRATE 10 MG/0.5ML oral solution 5-10 mg  5-10 mg Oral Q2H PRN Lener Ventresca, Kirt Boys, NP   10 mg at 12/15/21 4818   multivitamin with minerals tablet 1 tablet  1 tablet Oral Daily Barb Merino, MD   1 tablet at 12/15/21 0836   nystatin (MYCOSTATIN) 100000 UNIT/ML suspension 500,000 Units  5 mL Mouth/Throat QID Barb Merino, MD   500,000 Units at 12/15/21 0836   ondansetron (ZOFRAN) tablet 4 mg  4 mg Oral Q6H PRN Para Skeans, MD       Or   ondansetron [facility]) injection 4 mg  4 mg Intravenous Q6H PRN Para Skeans, MD       polyethylene glycol (MIRALAX / GLYCOLAX) packet 17 g  17 g Oral Daily PRN Para Skeans, MD   17 g at 12/10/21 1639   revefenacin (YUPELRI) nebulizer solution 175 mcg  175 mcg Nebulization Daily Tyler Pita, MD   175 mcg at 12/15/21 0740   sodium chloride flush (NS) 0.9 % injection 3 mL  3 mL Intravenous Q12H Florina Ou V, MD   3 mL at 12/15/21 0837   traZODone (DESYREL) tablet 100 mg  100 mg Oral QHS Barb Merino, MD   100 mg at 12/14/21 2032    VITAL SIGNS: BP (!) 157/66 (BP Location: Left Arm)   Pulse 87   Temp (!) 97.4 F (36.3 C)   Resp 18   Ht 5\' 1"  (1.549 m)   Wt 97 lb 10.6 oz (44.3 kg)   SpO2 90%   BMI 18.45 kg/m  Filed  Weights   12/11/21 0256 12/14/21 1016 12/15/21 0500  Weight: 103 lb 9.9 oz (47 kg) 97 lb 0.1 oz (44 kg) 97 lb 10.6 oz (44.3 kg)    Estimated body mass index is 18.45 kg/m as calculated from the following:   Height as of this  encounter: 5\' 1"  (1.549 m).   Weight as of this encounter: 97 lb 10.6 oz (44.3 kg).  LABS: CBC:    Component Value Date/Time   WBC 14.7 (H) 12/08/2021 0523   HGB 8.1 (L) 12/08/2021 0523   HGB 11.5 02/01/2019 1522   HCT 26.8 (L) 12/08/2021 0523   HCT 34.4 02/01/2019 1522   PLT 412 (H) 12/08/2021 0523   PLT 274 02/01/2019 1522   MCV 74.2 (L) 12/08/2021 0523   MCV 89 02/01/2019 1522   NEUTROABS 4.2 12/23/2016 1615   LYMPHSABS 1.5 12/23/2016 1615   MONOABS 0.7 02/01/2016 1514   EOSABS 0.3 12/23/2016 1615   BASOSABS 0.0 12/23/2016 1615   Comprehensive Metabolic Panel:    Component Value Date/Time   NA 131 (L) 12/05/2021 0559   NA 136 06/13/2018 0945   K 4.1 12/05/2021 0559   CL 95 (L) 12/05/2021 0559   CO2 27 12/05/2021 0559   BUN 11 12/05/2021 0559   BUN 10 06/13/2018 0945   CREATININE 0.71 12/05/2021 0559   GLUCOSE 96 12/05/2021 0559   CALCIUM 9.1 12/05/2021 0559   AST 12 06/13/2018 0945   ALT 12 (L) 02/02/2017 0948   ALKPHOS 83 06/13/2018 0945   BILITOT <0.2 06/13/2018 0945   PROT 6.2 06/13/2018 0945   ALBUMIN 3.9 06/13/2018 0945    RADIOGRAPHIC STUDIES:  PERFORMANCE STATUS (ECOG) : 1 - Symptomatic but completely ambulatory  Review of Systems Unless otherwise noted, a complete review of systems is negative.  Physical Exam General: NAD Cardiovascular: regular rate and rhythm Pulmonary: coarse, exertionally labored Abdomen: soft, nontender, + bowel sounds GU: no suprapubic tenderness Extremities: no edema, no joint deformities Skin: no rashes Neurological: Weakness but otherwise nonfocal  IMPRESSION: Follow-up visit.  Patient remains on O2 and symptomatic with exertional dyspnea.  Poor oral intake.  Overall, no significant clinical  improvement with XRT.  Patient and son have decided to pursue inpatient hospice at the [facility].  Spoke with hospice liaison who is coordinating.  Also spoke with hospice physician, Dr. Lyman Speller, who agreed to clinical appropriateness of hospice IPU.  PLAN: -Hospice IPU when a bed is available  Case and plan discussed with Dr. Rogue Bussing   Patient expressed understanding and was in agreement with this plan. She also understands that She can call the clinic at any time with any questions, concerns, or complaints.     Time Total: 30 minutes  Visit consisted of counseling and education dealing with the complex and emotionally intense issues of symptom management and palliative care in the setting of serious and potentially life-threatening illness.Greater than 50%  of this time was spent counseling and coordinating care related to the above assessment and plan.  Signed by: Altha Harm, PhD, NP-C

## 2021-12-15 NOTE — Progress Notes (Addendum)
Manufacturing engineer [facility]) Hospital Liaison Note  Received request from son/Jimmy in [place] vs. Returning home with services. Visited patient at bedside and spoke with Laverna Peace, patient & S.O./David to confirm interest and explain services.  Approval for Hospice Home is determined by [facility] MD. Once [facility] MD has determined Hospice Home eligibility, [place] will update hospital staff and family. Patient has been approved.  [place]: 2:27 p.m. Plan is for patient to transfer via private vehicle tomorrow per family request as family would like for patient to visit her home one more time before transfer to [facility]. Family is aware that if patient were to decline or be in need of medical attention to contact 911 as patient will not be considered an active [facility] patient until admitted to the [facility].  Plan is for patient to DC: tomorrow, 7.19, at 12 p.m. IDT/Family is aware of updates above.  Please do not hesitate to call with any hospice related questions.    Thank you for the opportunity to participate in this patient's care.  Daphene Calamity, MSW [facility] Liaison  [phone]

## 2021-12-16 ENCOUNTER — Ambulatory Visit: Payer: Medicare Other

## 2021-12-16 DIAGNOSIS — C3490 Malignant neoplasm of unspecified part of unspecified bronchus or lung: Secondary | ICD-10-CM | POA: Diagnosis not present

## 2021-12-16 DIAGNOSIS — R0602 Shortness of breath: Secondary | ICD-10-CM | POA: Diagnosis not present

## 2021-12-16 MED ORDER — BUDESONIDE 0.25 MG/2ML IN SUSP: 0.2500 mg | Freq: Two times a day (BID) | RESPIRATORY_TRACT | 12 refills | Status: AC

## 2021-12-16 MED ORDER — GABAPENTIN 250 MG/5ML PO SOLN: 800.0000 mg | Freq: Four times a day (QID) | ORAL | 12 refills | Status: AC

## 2021-12-16 MED ORDER — ESCITALOPRAM OXALATE 10 MG PO TABS: 10.0000 mg | ORAL_TABLET | Freq: Every day | ORAL | Status: AC

## 2021-12-16 MED ORDER — ARFORMOTEROL TARTRATE 15 MCG/2ML IN NEBU: 15.0000 ug | INHALATION_SOLUTION | Freq: Two times a day (BID) | RESPIRATORY_TRACT | Status: AC

## 2021-12-16 NOTE — TOC Progression Note (Signed)
Transition of Care [facility]) - Progression Note    Patient Details  Name: Faustine Tates MRN: [id number] Date of Birth: 09-Jun-1955  Transition of Care [facility]) CM/SW [place], RN Phone Number: 12/16/2021, 9:55 AM  Clinical Narrative:   Patient discharging at noon today, son will transport, and he is comfortable with portable oxygen tanks.  Lorayne Bender from [facility] aware, and arranged discharge.    Expected Discharge Plan: Home/Self Care Barriers to Discharge: Continued Medical Work up  Expected Discharge Plan and Services Expected Discharge Plan: Home/Self Care In-house Referral: Chaplain Discharge Planning Services: CM Consult   Living arrangements for the past 2 months: Single Family Home Expected Discharge Date: 12/16/21                                     Social Determinants of Health (SDOH) Interventions    Readmission Risk Interventions    12/04/2021    3:23 PM  Readmission Risk Prevention Plan  Transportation Screening Complete  PCP or Specialist Appt within 5-7 Days Complete  Home Care Screening Complete  Medication Review (RN CM) Complete

## 2021-12-16 NOTE — Progress Notes (Signed)
Pt d/c to hospice home with son. IV removed per hospice request. All belongings sent with pt. Report given to Claudette Head at [facility].

## 2021-12-16 NOTE — Discharge Summary (Signed)
Physician Discharge Summary   Deanna Jacobson BSJ:[id number] DOB: Aug 15, 1955 DOA: 12/03/2021  PCP: Donnie Coffin, MD  Admit date: 12/03/2021 Discharge date:  12/16/2021  Admitted From: Home Disposition:  [place] Discharging physician: Dwyane Dee, MD  Recommendations for Outpatient Follow-up:  Continue comfort care  Discharge Condition: stable CODE STATUS: DNR Diet recommendation:  Diet Orders (From admission, onward)     Start     Ordered   12/16/21 0000  Diet general        12/16/21 0945   12/14/21 1403  Diet regular Room service appropriate? Yes with Assist; Fluid consistency: Thin  Diet effective now       Comments: Please send meats CHOPPED w/ Extra Gravy to moisten well. Soups.  PLEASE SEND PUREED FRUITS(pineapple, berries, peaches) AND MAGIC CUPS(ORANGE OFTEN) AT EVERY MEAL.  Question Answer Comment  Room service appropriate? Yes with Assist   Fluid consistency: Thin      12/14/21 1404   12/14/21 0000  Diet - low sodium heart healthy        12/14/21 0837   12/13/21 0000  Diet - low sodium heart healthy        12/13/21 0746   12/13/21 0000  Diet - low sodium heart healthy        12/13/21 1412            Hospital Course: Deanna Jacobson is a 66 year old with history of hypertension, previous smoker, recently treated with extended course of antibiotics for pneumonia had a routine CT scan of the chest by primary care physician's office and found to have extensive malignancy and sent to ER.  Deanna Jacobson with generalized symptoms of malaise, fatigue, cough and shortness of breath.  Admitted for symptomatic treatment, diagnosis.  Oncology consultation. Bronchoscopy and biopsy, consistent with small cell lung cancer. MRI brain with multiple metastatic lesions. Deanna Jacobson continued hospitalization for symptom control and also inpatient radiation treatment. Deanna Jacobson was followed closely by palliative care and oncology. Ongoing [place] discussions were held and ultimately Deanna Jacobson and  family decided to forego further treatments and pursue comfort care/hospice.  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  Now pursuing comfort care   SOB (shortness of breath) - continue comfort care - continue inhalers   Acute renal failure (ARF) (HCC) Resolved   Iron deficiency anemia 8-9, microcytic, ACD   Reflux esophagitis PPI   Disorder of skeletal system Pt has chronic severe arthritis. Pt stopped smoking in 2019.    Hyponatremia Due to SIADH, most likely.  - plan now comfort care   Chronic pain syndrome:  Deanna Jacobson on Flexeril and gabapentin and Celebrex. Responded to morphine and xanax  Essential hypertension: Blood pressures stable off meds; can consider restart amlodipine if BP uptrends   Hyponatremia: Consistent with SIADH with lung cancer.  Stable.    Principal Diagnosis: SOB (shortness of breath)  Discharge Diagnoses: Active Hospital Problems   Diagnosis Date Noted   SOB (shortness of breath) 12/03/2021    Priority: 1.   Acute renal failure (ARF) ([place]) 10/28/2015    Priority: 2.   Iron deficiency anemia     Priority: 3.   Reflux esophagitis     Priority: 4.   Disorder of skeletal system 06/13/2018    Priority: 5.   Hyponatremia  12/04/2021    Priority: 6.   Malignant neoplasm of lung ([place])    Malnutrition of moderate degree 12/04/2021   Palliative care encounter     Resolved Hospital Problems  No resolved problems to display.     Discharge Instructions     Amb Referral to Palliative Care   Complete by: As directed    Diet - low sodium heart healthy   Complete by: As directed    Diet - low sodium heart healthy   Complete by: As directed    Diet -  low sodium heart healthy   Complete by: As directed    Diet general   Complete by: As directed    Increase activity slowly   Complete by: As directed    Increase activity slowly   Complete by: As directed    Increase activity slowly   Complete by: As directed       Allergies as of 12/16/2021       Reactions   Codeine Nausea And Vomiting        Medication List     STOP taking these medications    amLODipine 10 MG tablet Commonly known as: NORVASC   amLODipine 2.5 MG tablet Commonly known as: NORVASC   celecoxib 100 MG capsule Commonly known as: CELEBREX   gabapentin 600 MG tablet Commonly known as: NEURONTIN Replaced by: gabapentin 250 MG/5ML solution   gabapentin 800 MG tablet Commonly known as: NEURONTIN   lisinopril 20 MG tablet Commonly known as: ZESTRIL   nabumetone 500 MG tablet Commonly known as: RELAFEN   omeprazole 20 MG capsule Commonly known as: PRILOSEC       TAKE these medications    Acetaminophen 8 Hour 650 MG CR tablet Generic drug: acetaminophen Take 1-2 tablets by mouth every 8 (eight) hours as needed.   albuterol 108 (90 Base) MCG/ACT inhaler Commonly known as: VENTOLIN HFA Inhale 1 puff into the lungs every 4 (four) hours as needed.   arformoterol 15 MCG/2ML Nebu Commonly known as: BROVANA Take 2 mLs (15 mcg total) by nebulization 2 (two) times daily.   budesonide 0.25 MG/2ML nebulizer solution Commonly known as: PULMICORT Take 2 mLs (0.25 mg total) by nebulization 2 (two) times daily.   cyclobenzaprine 10 MG tablet Commonly known as: FLEXERIL Take 1 tablet (10 mg total) by mouth 3 (three) times daily as needed for muscle spasms.   escitalopram 10 MG tablet Commonly known as: LEXAPRO Take 1 tablet (10 mg total) by mouth daily. Start taking on: December 17, 2021 What changed:  medication strength how much to take   gabapentin 250 MG/5ML solution Commonly known as: NEURONTIN Take 16 mLs (800 mg total) by mouth every 6  (six) hours. Replaces: gabapentin 600 MG tablet   traZODone 100 MG tablet Commonly known as: DESYREL Take 100 mg by mouth at bedtime.               Durable Medical Equipment  (From admission, onward)           Start     Ordered   12/14/21 0935  DME Oxygen  Once       Comments: continuous and transport needs  Question Answer Comment  Length of Need 6 Months   Mode or (Route) Nasal cannula   Liters per Minute 4   Frequency Continuous (stationary and portable oxygen unit needed)   Oxygen conserving device Yes   Oxygen delivery system Gas      12/14/21 0934  Follow-up Information     Donnie Coffin, MD. Go on 12/23/2021.   Specialty: Family Medicine Why: @5 :00PM Contact information: [place] [address] [phone]         Cammie Sickle, MD. Go in 7 day(s).   Specialties: Internal Medicine, Oncology Why: pt. need to call office and schedule appt. Contact information: [place] [address] [phone]                Allergies  Allergen Reactions   Codeine Nausea And Vomiting    Consultations: Palliative Care Oncology  Procedures:   Discharge Exam: BP 139/69 (BP Location: Left Arm)   Pulse 83   Temp 98 F (36.7 C)   Resp 18   Ht 5\' 1"  (1.549 m)   Wt 44.3 kg   SpO2 93%   BMI 18.45 kg/m  Physical Exam Constitutional:      General: Deanna Jacobson is not in acute distress.    Appearance: Normal appearance.  HENT:     Head: Normocephalic and atraumatic.     Mouth/Throat:     Mouth: Mucous membranes are moist.  Eyes:     Extraocular Movements: Extraocular movements intact.  Cardiovascular:     Rate and Rhythm: Normal rate and regular rhythm.     Heart sounds: Normal heart sounds.     Comments: 3/6 HSM noted in USBs Pulmonary:     Effort: Pulmonary effort is normal. No respiratory distress.     Breath sounds: No wheezing.     Comments: Diffuse coarse breath sounds  bilaterally  Abdominal:     General: Bowel sounds are normal. There is no distension.     Palpations: Abdomen is soft.     Tenderness: There is no abdominal tenderness.  Musculoskeletal:        General: Normal range of motion.     Cervical back: Normal range of motion and neck supple.  Skin:    General: Skin is warm and dry.  Neurological:     General: No focal deficit present.     Mental Status: Deanna Jacobson is alert.  Psychiatric:        Mood and Affect: Mood normal.        Behavior: Behavior normal.      The results of significant diagnostics from this hospitalization (including imaging, microbiology, ancillary and laboratory) are listed below for reference.   Microbiology: No results found for this or any previous visit (from the past 240 hour(s)).   Labs: BNP (last 3 results) No results for input(s): "BNP" in the last 8760 hours. Basic Metabolic Panel: No results for input(s): "NA", "K", "CL", "CO2", "GLUCOSE", "BUN", "CREATININE", "CALCIUM", "MG", "PHOS" in the last 168 hours. Liver Function Tests: No results for input(s): "AST", "ALT", "ALKPHOS", "BILITOT", "PROT", "ALBUMIN" in the last 168 hours. No results for input(s): "LIPASE", "AMYLASE" in the last 168 hours. No results for input(s): "AMMONIA" in the last 168 hours. CBC: No results for input(s): "WBC", "NEUTROABS", "HGB", "HCT", "MCV", "PLT" in the last 168 hours. Cardiac Enzymes: No results for input(s): "CKTOTAL", "CKMB", "CKMBINDEX", "TROPONINI" in the last 168 hours. BNP: Invalid input(s): "POCBNP" CBG: No results for input(s): "GLUCAP" in the last 168 hours. D-Dimer No results for input(s): "DDIMER" in the last 72 hours. Hgb A1c No results for input(s): "HGBA1C" in the last 72 hours. Lipid Profile No results for input(s): "CHOL", "HDL", "LDLCALC", "TRIG", "CHOLHDL", "LDLDIRECT" in the last 72 hours. Thyroid function studies No results for  input(s): "TSH", "T4TOTAL", "T3FREE", "THYROIDAB" in the last 72  hours.  Invalid input(s): "FREET3" Anemia work up No results for input(s): "VITAMINB12", "FOLATE", "FERRITIN", "TIBC", "IRON", "RETICCTPCT" in the last 72 hours. Urinalysis    Component Value Date/Time   COLORURINE YELLOW (A) 10/28/2015 1540   APPEARANCEUR CLOUDY (A) 10/28/2015 1540   LABSPEC 1.006 10/28/2015 1540   PHURINE 5.0 10/28/2015 1540   GLUCOSEU NEGATIVE 10/28/2015 1540   HGBUR 1+ (A) 10/28/2015 1540   BILIRUBINUR NEGATIVE 10/28/2015 1540   KETONESUR NEGATIVE 10/28/2015 1540   PROTEINUR 30 (A) 10/28/2015 1540   NITRITE NEGATIVE 10/28/2015 1540   LEUKOCYTESUR 2+ (A) 10/28/2015 1540   Sepsis Labs No results for input(s): "WBC" in the last 168 hours.  Invalid input(s): "PROCALCITONIN", "LACTICIDVEN" Microbiology No results found for this or any previous visit (from the past 240 hour(s)).  Procedures/Studies: ECHOCARDIOGRAM LIMITED  Result Date: 12/09/2021    ECHOCARDIOGRAM LIMITED REPORT   Deanna Jacobson Name:   Deanna Jacobson Date of Exam: 12/09/2021 Medical Rec #:  [id number]         Height:       61.0 in Accession #:    [id number]        Weight:       101.9 lb Date of Birth:  [birth year]        BSA:          1.418 m Deanna Jacobson Age:    35 years          BP:           144/87 mmHg Deanna Jacobson Gender: F                 HR:           86 bpm. Exam Location:  [facility] Procedure: 2D Echo, Cardiac Doppler and Color Doppler Indications:     Aortic valve stenosis I35.9  History:         Deanna Jacobson has prior history of Echocardiogram examinations, most                  recent 12/04/2021.  Sonographer:     Sherrie Sport Referring Phys:  [id number] [facility] AM[facility] Diagnosing Phys: Kate Sable MD  Sonographer Comments: Technically challenging study due to limited acoustic windows, no apical window and suboptimal subcostal window. Image acquisition challenging due to COPD and Image acquisition challenging due to Deanna Jacobson body habitus. IMPRESSIONS  1. Left ventricular ejection fraction, by estimation, is 60 to 65%. The  left ventricle has normal function.  2. The mitral valve is normal in structure. No evidence of mitral valve regurgitation.  3. Aortic valve hemodynamics /gradient not obtained likely due to body habitus. Unable to fully assess aortic valve calcifications or stenosis.. The aortic valve is calcified. FINDINGS  Left Ventricle: Left ventricular ejection fraction, by estimation, is 60 to 65%. The left ventricle has normal function. Pericardium: Trivial pericardial effusion is present. Mitral Valve: The mitral valve is normal in structure. Aortic Valve: Aortic valve hemodynamics /gradient not obtained likely due to body habitus. Unable to fully assess aortic valve calcifications or stenosis. The aortic valve is calcified. Aorta: The aortic root is normal in size and structure. LEFT VENTRICLE PLAX 2D LVIDd:         3.50 cm LVIDs:         2.30 cm LV PW:         1.10 cm LV IVS:        1.30 cm  LEFT ATRIUM  Index LA diam:    3.80 cm 2.68 cm/m   AORTA Ao Root diam: 2.80 cm TRICUSPID VALVE TR Peak grad:   38.7 mmHg TR Vmax:        311.00 cm/s Kate Sable MD Electronically signed by Kate Sable MD Signature Date/Time: 12/09/2021/2:48:30 PM    Final    MR BRAIN W WO CONTRAST  Result Date: 12/09/2021 CLINICAL DATA:  Initial evaluation for history of non-small cell lung cancer, evaluate for metastatic disease. EXAM: MRI HEAD WITHOUT AND WITH CONTRAST TECHNIQUE: Multiplanar, multiecho pulse sequences of the brain and surrounding structures were obtained without and with intravenous contrast. CONTRAST:  47mL GADAVIST GADOBUTROL 1 MMOL/ML IV SOLN COMPARISON:  Prior head CT from 11/27/2007. FINDINGS: Brain: Cerebral volume within normal limits. Patchy T2/FLAIR hyperintensity involving the periventricular and deep white matter both cerebral hemispheres most consistent with chronic small vessel ischemic disease, mild to moderate in nature. No evidence for acute or subacute infarct. No areas of chronic cortical  infarction. No acute or chronic intracranial blood products. Multiple scattered cystic masses are seen involving both cerebral hemispheres as well as the cerebellum, consistent with metastatic disease. There are at least 8 lesions in total. For reference purposes, the largest lesion involving the left cerebral hemisphere is positioned at the left parieto-occipital region and measures 3.3 x 2.3 x 2.5 cm (series 18, image 101). The largest lesion within the right cerebral hemisphere involves the peripheral right frontal lobe in measures 1.5 x 1.6 x 1.3 cm (series 18, image 129). Largest cerebellar lesion seen within the peripheral right cerebellum and measures 1.3 x 2.0 x 1.7 cm (series 18, image 35). These lesions are predominantly cystic in nature with peripheral postcontrast enhancement. No significant surrounding vasogenic edema or regional mass effect. No midline shift. Basilar cisterns remain patent. Ventricles normal size without hydrocephalus. No extra-axial fluid collection. Pituitary gland and suprasellar region within normal limits. Vascular: Major intracranial vascular flow voids are maintained. Skull and upper cervical spine: Craniocervical junction within normal limits. Bone marrow signal intensity normal. No focal marrow replacing lesion. No scalp soft tissue abnormality. Sinuses/Orbits: Globes orbital soft tissues within normal limits. Paranasal sinuses are largely clear. Small left mastoid effusion noted, of doubtful significance. Other: None. IMPRESSION: 1. Multiple scattered cystic masses involving both cerebral hemispheres and cerebellum, consistent with intracranial metastatic disease. No significant surrounding vasogenic edema or regional mass effect. 2. No other acute intracranial abnormality. 3. Underlying mild-to-moderate chronic microvascular ischemic disease. Electronically Signed   By: Jeannine Boga M.D.   On: 12/09/2021 04:15   CT ABDOMEN PELVIS W CONTRAST  Result Date:  12/08/2021 CLINICAL DATA:  Small cell lung cancer, for staging EXAM: CT ABDOMEN AND PELVIS WITH CONTRAST TECHNIQUE: Multidetector CT imaging of the abdomen and pelvis was performed using the standard protocol following bolus administration of intravenous contrast. RADIATION DOSE REDUCTION: This exam was performed according to the departmental dose-optimization program which includes automated exposure control, adjustment of the mA and/or kV according to Deanna Jacobson size and/or use of iterative reconstruction technique. CONTRAST:  153mL OMNIPAQUE IOHEXOL 300 MG/ML  SOLN COMPARISON:  Partial comparison to CT chest dated 12/02/2021 FINDINGS: Lower chest: Small left pleural effusion, progressive. Additional left lower lobe tumor is better described on recent CT. Hepatobiliary: Liver is within normal limits. No suspicious enhancing hepatic lesions. Layering tiny gallstones in the gallbladder fundus (series 2/image 29), without associated inflammatory changes. No intrahepatic or extrahepatic ductal dilatation. Pancreas: Within normal limits. Spleen: Within normal limits. Adrenals/Urinary Tract: Adrenal glands are within  normal limits. Kidneys are within normal limits.  No hydronephrosis. Bladder is within normal limits. Stomach/Bowel: Stomach is within normal limits. No evidence of bowel obstruction. Normal appendix (series 2/image 84). Moderate colonic stool burden, suggesting constipation. Vascular/Lymphatic: No evidence of abdominal aortic aneurysm. Atherosclerotic calcifications of the abdominal aorta and branch vessels. No suspicious abdominopelvic lymphadenopathy. Reproductive: Uterus is within normal limits. No adnexal masses. Other: No abdominopelvic ascites. Musculoskeletal: No focal osseous lesions. IMPRESSION: No findings suspicious for metastatic disease in the abdomen/pelvis. Small left pleural effusion, mildly progressive. Electronically Signed   By: Julian Hy M.D.   On: 12/08/2021 22:28    ECHOCARDIOGRAM COMPLETE  Result Date: 12/08/2021    ECHOCARDIOGRAM REPORT   Deanna Jacobson Name:   Deanna Jacobson Date of Exam: 12/04/2021 Medical Rec #:  [id number]         Height:       61.0 in Accession #:    [id number]        Weight:       101.2 lb Date of Birth:  [birth year]        BSA:          1.414 m Deanna Jacobson Age:    37 years          BP:           151/62 mmHg Deanna Jacobson Gender: F                 HR:           94 bpm. Exam Location:  [facility] Procedure: 2D Echo, Cardiac Doppler and Color Doppler Indications:     Pericardial Effusion I31.3  History:         Deanna Jacobson has no prior history of Echocardiogram examinations.                  Risk Factors:Hypertension.  Sonographer:     Sherrie Sport Referring Phys:  [id number] CARMEN L GONZALEZ Diagnosing Phys: Ida Rogue MD  Sonographer Comments: Technically challenging study due to limited acoustic windows, no apical window and no subcostal window. IMPRESSIONS  1. Left ventricular ejection fraction, by estimation, is 60 to 65%. The left ventricle has normal function. The left ventricle has no regional wall motion abnormalities. There is mild left ventricular hypertrophy. Left ventricular diastolic parameters are indeterminate.  2. Right ventricular systolic function is normal. The right ventricular size is normal. Tricuspid regurgitation signal is inadequate for assessing PA pressure.  3. A small pericardial effusion is present. There is no evidence of cardiac tamponade.  4. The mitral valve is normal in structure. No evidence of mitral valve regurgitation. No evidence of mitral stenosis.  5. The aortic valve is normal in structure. There is moderate calcification of the aortic valve. Aortic valve regurgitation is not visualized. Aortic valve sclerosis/calcification is present, unable to exclude stenosis as gradient not obtained.  6. The inferior vena cava is normal in size with greater than 50% respiratory variability, suggesting right atrial pressure of 3 mmHg. FINDINGS  Left  Ventricle: Left ventricular ejection fraction, by estimation, is 60 to 65%. The left ventricle has normal function. The left ventricle has no regional wall motion abnormalities. The left ventricular internal cavity size was normal in size. There is  mild left ventricular hypertrophy. Left ventricular diastolic parameters are indeterminate. Right Ventricle: The right ventricular size is normal. No increase in right ventricular wall thickness. Right ventricular systolic function is normal. Tricuspid regurgitation signal is inadequate for assessing PA pressure. Left Atrium: Left atrial size  was normal in size. Right Atrium: Right atrial size was normal in size. Pericardium: A small pericardial effusion is present. There is no evidence of cardiac tamponade. Mitral Valve: The mitral valve is normal in structure. No evidence of mitral valve regurgitation. No evidence of mitral valve stenosis. Tricuspid Valve: The tricuspid valve is normal in structure. Tricuspid valve regurgitation is not demonstrated. No evidence of tricuspid stenosis. Aortic Valve: The aortic valve is normal in structure. There is moderate calcification of the aortic valve. Aortic valve regurgitation is not visualized. Aortic valve sclerosis/calcification is present, without any evidence of aortic stenosis. Pulmonic Valve: The pulmonic valve was normal in structure. Pulmonic valve regurgitation is not visualized. No evidence of pulmonic stenosis. Aorta: The aortic root is normal in size and structure. Venous: The inferior vena cava is normal in size with greater than 50% respiratory variability, suggesting right atrial pressure of 3 mmHg. IAS/Shunts: No atrial level shunt detected by color flow Doppler.  LEFT VENTRICLE PLAX 2D LVIDd:         3.70 cm LVIDs:         1.50 cm LV PW:         1.20 cm LV IVS:        1.30 cm LVOT diam:     2.00 cm LVOT Area:     3.14 cm  LEFT ATRIUM         Index LA diam:    3.70 cm 2.62 cm/m   AORTA Ao Root diam: 2.77 cm   SHUNTS Systemic Diam: 2.00 cm Ida Rogue MD Electronically signed by Ida Rogue MD Signature Date/Time: 12/04/2021/5:15:56 PM    Final (Updated)    DG Chest Port 1 View  Result Date: 12/07/2021 CLINICAL DATA:  Status post bronchoscopy, shortness of breath EXAM: PORTABLE CHEST 1 VIEW COMPARISON:  12/03/2021 FINDINGS: Bilateral interstitial thickening. Right upper lobe airspace disease peripherally. Faint right upper lobe airspace opacity. Lingular and left lower lobe airspace disease. No pleural effusion or pneumothorax. Mediastinal lymphadenopathy. Stable cardiomegaly. No acute osseous abnormality. IMPRESSION: 1. Bilateral patchy airspace disease and interstitial thickening unchanged from the CT of the chest dated 12/02/2021. 2. Persistent mediastinal lymphadenopathy. 3. No pneumothorax. Electronically Signed   By: Kathreen Devoid M.D.   On: 12/07/2021 14:38   DG Chest 2 View  Result Date: 12/03/2021 CLINICAL DATA:  Shortness of breath. EXAM: CHEST - 2 VIEW COMPARISON:  Radiograph November 23, 2021 and chest CT December 02, 2021 FINDINGS: Normal size heart. Aortic atherosclerosis. Similar widening of the upper mediastinum previously characterized as an upper mediastinal mass on CT December 02, 2021. Similar multifocal pulmonary opacities. No pleural effusion or pneumothorax. The visualized skeletal structures are unchanged. IMPRESSION: Similar multifocal pulmonary opacities and mediastinal widening reflecting the large mediastinal mass seen on previous day CT. No new focal areas of consolidation or overt pulmonary edema. Electronically Signed   By: Dahlia Bailiff M.D.   On: 12/03/2021 16:30   CT CHEST W CONTRAST  Result Date: 12/03/2021 CLINICAL DATA:  Chronic cough for 1 month. EXAM: CT CHEST WITH CONTRAST TECHNIQUE: Multidetector CT imaging of the chest was performed during intravenous contrast administration. RADIATION DOSE REDUCTION: This exam was performed according to the departmental dose-optimization program  which includes automated exposure control, adjustment of the mA and/or kV according to Deanna Jacobson size and/or use of iterative reconstruction technique. body onc CONTRAST:  53mL OMNIPAQUE IOHEXOL 300 MG/ML  SOLN COMPARISON:  None Available. FINDINGS: Cardiovascular: Mild cardiac enlargement. Small to moderate pericardial effusion is identified,  which measures 1.4 cm in thickness over the right atrium. Aortic atherosclerosis. Coronary artery calcifications. Mediastinum/Nodes: Thyroid gland appears normal. Large mediastinal mass is identified which encases/narrows the trachea and bilateral mainstem bronchi as well as the esophagus. This measures 9.1 by 9.1 x 6.3 cm. There is marked mass effect and possible invasion of the superior vena cava which is significantly narrowed. There is encasement with narrowing of the main pulmonary arteries bilaterally. Mass effect and possible invasion of the left atrium is also noted. Encasement and narrowing with possible invasion of bilateral superior pulmonary veins noted. There is obstruction of the esophagus just above the level of the carina with postobstructive dilatation of the proximal esophagus. Wall thickening of the distal esophagus is noted with esophageal varices identified, image 93/2. Lungs/Pleura: No pleural effusion identified. 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 Postobstructive atelectasis and endobronchial spread of tumor is identified extending into the periphery of the left lower lobe. Within the posterior right upper lobe there is a subpleural soft tissue mass which extends medially along the major fissure measures 3.2 x 2.1 cm, image 33/3. Patchy areas of subsegmental atelectasis and ground-glass attenuation is noted within both upper lung zones which likely reflects postobstructive pneumonitis. Upper Abdomen: No acute abnormality.  Small hiatal hernia noted.  Musculoskeletal: No chest wall abnormality. No acute or significant osseous findings. IMPRESSION: 1. 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 There is marked mass effect and possible invasion of the superior vena cava which is significantly narrowed. There is encasement with narrowing of the main pulmonary arteries bilaterally. There is also encasement and narrowing of the bilateral superior pulmonary veins. Obstruction of the midthoracic esophagus is also noted. Tumor involvement of the pulmonary veins and left atrium is also noted. 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 Differential considerations include esophageal neoplasm or lymphoproliferative disorder. 2. Perihilar left lung mass is noted with endobronchial spread and postobstructive changes noted. This also invades the inferior left pulmonary vein. 3. Additional soft tissue mass within the posterior right upper lobe is identified which extends medially along the major fissure 4. Postobstructive atelectasis and endobronchial spread of tumor is identified extending into the periphery of the left lower lobe. 5. Small to moderate pericardial effusion. 6. Coronary artery calcifications. 7. Small hiatal hernia. 8. Aortic Atherosclerosis (ICD10-I70.0). Electronically Signed   By: Kerby Moors M.D.   On: 12/03/2021 06:15   DG Chest 2 View  Result Date: 11/23/2021 CLINICAL DATA:  Dry cough, shortness of breath, symptoms for 1-2 months, heart murmur, former smoker, hypertension EXAM: CHEST - 2 VIEW COMPARISON:  11/27/2007 FINDINGS: Upper normal size of cardiac silhouette. Atherosclerotic calcification aorta. Abnormal RIGHT paratracheal border, question adenopathy versus mass. Bronchitic changes and hyperinflation with interstitial infiltrates in the mid to lower lungs bilaterally. No pleural effusion or pneumothorax. Bones demineralized. IMPRESSION: Bronchitic changes with  infiltrates in the mid to lower lungs bilaterally question pneumonia. RIGHT paratracheal mass versus adenopathy; CT chest with contrast recommended to exclude mass/adenopathy. Aortic Atherosclerosis (ICD10-I70.0). These results will be called to the ordering clinician or representative by the Radiologist Assistant, and communication documented in the PACS or [employer]. Electronically Signed   By: Lavonia Dana M.D.   On: 11/23/2021 16:11     Time coordinating discharge: Over 30 minutes    Dwyane Dee, MD  [facility] 12/16/2021, 9:45 AM

## 2021-12-17 ENCOUNTER — Inpatient Hospital Stay: Payer: Medicare Other | Attending: Anatomic Pathology

## 2021-12-17 ENCOUNTER — Ambulatory Visit: Payer: Medicare Other

## 2021-12-18 ENCOUNTER — Ambulatory Visit: Payer: Medicare Other

## 2021-12-21 ENCOUNTER — Ambulatory Visit: Payer: Medicare Other

## 2021-12-22 ENCOUNTER — Ambulatory Visit: Payer: Medicare Other

## 2021-12-23 ENCOUNTER — Ambulatory Visit: Payer: Medicare Other

## 2021-12-24 ENCOUNTER — Ambulatory Visit: Payer: Medicare Other

## 2021-12-24 ENCOUNTER — Encounter: Payer: Self-pay | Admitting: *Deleted

## 2021-12-25 ENCOUNTER — Ambulatory Visit: Payer: Medicare Other

## 2021-12-28 ENCOUNTER — Ambulatory Visit: Payer: Medicare Other

## 2021-12-29 ENCOUNTER — Ambulatory Visit: Payer: Medicare Other

## 2021-12-29 DEATH — deceased

## 2021-12-30 ENCOUNTER — Ambulatory Visit: Payer: Medicare Other

## 2021-12-31 ENCOUNTER — Ambulatory Visit: Payer: Medicare Other | Admitting: Cardiology

## 2021-12-31 ENCOUNTER — Ambulatory Visit: Payer: Medicare Other

## 2022-01-01 ENCOUNTER — Encounter: Payer: Self-pay | Admitting: Cardiology

## 2022-01-01 ENCOUNTER — Ambulatory Visit: Payer: Medicare Other

## 2022-01-04 ENCOUNTER — Ambulatory Visit: Payer: Medicare Other

## 2022-01-05 ENCOUNTER — Ambulatory Visit: Payer: Medicare Other
